# Patient Record
Sex: Male | Born: 1970 | Race: Black or African American | Hispanic: No | Marital: Single | Smoking: Current every day smoker
Health system: Southern US, Community
[De-identification: ages and names within clinical notes are randomized; demographics above are authoritative.]

## PROBLEM LIST (undated history)

## (undated) VITALS — BP 96/66 | HR 86 | Temp 97.4°F | Resp 16 | Ht 73.0 in | Wt 154.0 lb

## (undated) DIAGNOSIS — D571 Sickle-cell disease without crisis: Secondary | ICD-10-CM

## (undated) DIAGNOSIS — F419 Anxiety disorder, unspecified: Secondary | ICD-10-CM

## (undated) DIAGNOSIS — F32A Depression, unspecified: Secondary | ICD-10-CM

## (undated) DIAGNOSIS — F329 Major depressive disorder, single episode, unspecified: Secondary | ICD-10-CM

## (undated) DIAGNOSIS — F1010 Alcohol abuse, uncomplicated: Secondary | ICD-10-CM

## (undated) DIAGNOSIS — I10 Essential (primary) hypertension: Secondary | ICD-10-CM

## (undated) HISTORY — PX: TONSILLECTOMY: SUR1361

---

## 2013-05-18 ENCOUNTER — Encounter (HOSPITAL_COMMUNITY): Payer: Self-pay

## 2013-05-18 ENCOUNTER — Emergency Department (HOSPITAL_COMMUNITY)
Admission: EM | Admit: 2013-05-18 | Discharge: 2013-05-20 | Disposition: A | Discharge status: Home / Self Care | Payer: Self-pay | Attending: Emergency Medicine | Admitting: Emergency Medicine

## 2013-05-18 DIAGNOSIS — F1010 Alcohol abuse, uncomplicated: Secondary | ICD-10-CM | POA: Insufficient documentation

## 2013-05-18 DIAGNOSIS — F10229 Alcohol dependence with intoxication, unspecified: Secondary | ICD-10-CM | POA: Insufficient documentation

## 2013-05-18 DIAGNOSIS — F17200 Nicotine dependence, unspecified, uncomplicated: Secondary | ICD-10-CM | POA: Insufficient documentation

## 2013-05-18 DIAGNOSIS — F3289 Other specified depressive episodes: Secondary | ICD-10-CM | POA: Insufficient documentation

## 2013-05-18 DIAGNOSIS — F10230 Alcohol dependence with withdrawal, uncomplicated: Secondary | ICD-10-CM

## 2013-05-18 DIAGNOSIS — F329 Major depressive disorder, single episode, unspecified: Secondary | ICD-10-CM | POA: Insufficient documentation

## 2013-05-18 DIAGNOSIS — G250 Essential tremor: Secondary | ICD-10-CM | POA: Insufficient documentation

## 2013-05-18 HISTORY — DX: Alcohol abuse, uncomplicated: F10.10

## 2013-05-18 LAB — RAPID URINE DRUG SCREEN, HOSP PERFORMED
Barbiturates: NOT DETECTED
Cocaine: NOT DETECTED
Opiates: NOT DETECTED

## 2013-05-18 LAB — COMPREHENSIVE METABOLIC PANEL
CO2: 25 mEq/L (ref 19–32)
Calcium: 9.5 mg/dL (ref 8.4–10.5)
Creatinine, Ser: 0.91 mg/dL (ref 0.50–1.35)
GFR calc Af Amer: >90 mL/min (ref >90)
GFR calc non Af Amer: >90 mL/min (ref >90)
Glucose, Bld: 78 mg/dL (ref 70–99)
Total Bilirubin: 0.4 mg/dL (ref 0.3–1.2)

## 2013-05-18 LAB — CBC
HCT: 44.6 % (ref 39.0–52.0)
Hemoglobin: 15.2 g/dL (ref 13.0–17.0)
MCH: 26.6 pg (ref 26.0–34.0)
MCV: 78 fL (ref 78.0–100.0)
RBC: 5.72 MIL/uL (ref 4.22–5.81)

## 2013-05-18 MED ORDER — LORAZEPAM 2 MG/ML IJ SOLN: 1.0000 mg | Freq: Four times a day (QID) | INTRAMUSCULAR | Status: DC | PRN

## 2013-05-18 MED ORDER — LORAZEPAM 1 MG PO TABS
0.0000 mg | ORAL_TABLET | Freq: Four times a day (QID) | ORAL | Status: DC
  Administered 2013-05-19: 1 mg via ORAL

## 2013-05-18 MED ORDER — ZOLPIDEM TARTRATE 5 MG PO TABS: 5.0000 mg | ORAL_TABLET | Freq: Every evening | ORAL | Status: DC | PRN

## 2013-05-18 MED ORDER — LORAZEPAM 1 MG PO TABS: 0.0000 mg | ORAL_TABLET | Freq: Two times a day (BID) | ORAL | Status: DC

## 2013-05-18 MED ORDER — THIAMINE HCL 100 MG/ML IJ SOLN: 100.0000 mg | Freq: Every day | INTRAMUSCULAR | Status: DC

## 2013-05-18 MED ORDER — VITAMIN B-1 100 MG PO TABS
100.0000 mg | ORAL_TABLET | Freq: Every day | ORAL | Status: DC
  Administered 2013-05-19 – 2013-05-20 (×2): 100 mg via ORAL
  Filled 2013-05-18: qty 1

## 2013-05-18 MED ORDER — FOLIC ACID 1 MG PO TABS
1.0000 mg | ORAL_TABLET | Freq: Every day | ORAL | Status: DC
  Administered 2013-05-19 – 2013-05-20 (×2): 1 mg via ORAL
  Filled 2013-05-18 (×2): qty 1

## 2013-05-18 MED ORDER — NICOTINE 21 MG/24HR TD PT24
21.0000 mg | MEDICATED_PATCH | Freq: Every day | TRANSDERMAL | Status: DC
  Administered 2013-05-19: 21 mg via TRANSDERMAL
  Filled 2013-05-18 (×2): qty 1

## 2013-05-18 MED ORDER — LORAZEPAM 1 MG PO TABS
1.0000 mg | ORAL_TABLET | Freq: Four times a day (QID) | ORAL | Status: DC | PRN
  Administered 2013-05-19: 1 mg via ORAL
  Filled 2013-05-18: qty 1

## 2013-05-18 MED ORDER — ADULT MULTIVITAMIN W/MINERALS CH
1.0000 | ORAL_TABLET | Freq: Every day | ORAL | Status: DC
  Administered 2013-05-19 – 2013-05-20 (×2): 1 via ORAL
  Filled 2013-05-18 (×2): qty 1

## 2013-05-18 MED ORDER — ONDANSETRON HCL 4 MG PO TABS: 4.0000 mg | ORAL_TABLET | Freq: Three times a day (TID) | ORAL | Status: DC | PRN

## 2013-05-18 NOTE — ED Provider Notes (Signed)
History  This chart was scribed for Jaynie Crumble, PA-C working with Juliet Rude. Rubin Payor, MD by Greggory Stallion, ED scribe. This patient was seen in room WTR4/WLPT4 and the patient's care was started at 6:44 PM.  CSN: [id number] Arrival date & time 05/18/13  1744   Chief Complaint  Patient presents with  . Alcohol Intoxication  . Medical Clearance   The history is provided by the patient. No language interpreter was used.    HPI Comments: Chad Sanchez is a 42 y.o. male who presents to the Emergency Department for alcohol detox. He states his last drink was 1 hour PTA. Pt states he was in a 2 year detox program and got out 9 months ago and started drinking 2 weeks ago. Pt states he felt like everything went wrong so he started drinking again. He states he has had a drinking problem for about 25 years. Pt states he drinks "as much as he can get, all day long". Pt denies SI and HI. Pt denies recreational drug use. Pt states he smokes cigarettes daily.   Past Medical History  Diagnosis Date  . Alcohol abuse    History reviewed. No pertinent past surgical history. No family history on file. History  Substance Use Topics  . Smoking status: Current Every Day Smoker  . Smokeless tobacco: Not on file  . Alcohol Use: Yes     Comment: a case a day,     Review of Systems  Psychiatric/Behavioral: Negative for suicidal ideas.  All other systems reviewed and are negative.    Allergies  Review of patient's allergies indicates no known allergies.  Home Medications  No current outpatient prescriptions on file.  BP 118/77  Pulse 88  Temp(Src) 98.5 F (36.9 C) (Oral)  Resp 16  Wt 160 lb (72.576 kg)  SpO2 98%  Physical Exam  Nursing note and vitals reviewed. Constitutional: He is oriented to person, place, and time. He appears well-developed and well-nourished. No distress.  Pt smells of alcohol.   HENT:  Head: Normocephalic and atraumatic.  Eyes: EOM are normal.  Neck:  Normal range of motion. Neck supple. No tracheal deviation present.  Cardiovascular: Normal rate, regular rhythm and normal heart sounds.   No murmur heard. Pulmonary/Chest: Effort normal and breath sounds normal. No respiratory distress. He has no wheezes. He has no rales.  Musculoskeletal: Normal range of motion.  Neurological: He is alert and oriented to person, place, and time.  No tremor noticed.   Skin: Skin is warm and dry. He is not diaphoretic.  Psychiatric: He has a normal mood and affect. His behavior is normal. Judgment and thought content normal.    ED Course  Procedures (including critical care time)  DIAGNOSTIC STUDIES: Oxygen Saturation is 98% on RA, normal by my interpretation.    COORDINATION OF CARE: 7:00 PM-Discussed treatment plan which includes talking with assessment team with pt at bedside and pt agreed to plan.   Results for orders placed during the hospital encounter of 05/18/13  ACETAMINOPHEN LEVEL      Result Value Range   Acetaminophen (Tylenol), Serum <15.0  10 - 30 ug/mL  CBC      Result Value Range   WBC 4.5  4.0 - 10.5 K/uL   RBC 5.72  4.22 - 5.81 MIL/uL   Hemoglobin 15.2  13.0 - 17.0 g/dL   HCT 40.9  81.1 - 91.4 %   MCV 78.0  78.0 - 100.0 fL   MCH 26.6  26.0 - 34.0  pg   MCHC 34.1  30.0 - 36.0 g/dL   RDW 45.4  09.8 - 11.9 %   Platelets 184  150 - 400 K/uL  COMPREHENSIVE METABOLIC PANEL      Result Value Range   Sodium 140  135 - 145 mEq/L   Potassium 3.8  3.5 - 5.1 mEq/L   Chloride 101  96 - 112 mEq/L   CO2 25  19 - 32 mEq/L   Glucose, Bld 78  70 - 99 mg/dL   BUN 7  6 - 23 mg/dL   Creatinine, Ser 1.47  0.50 - 1.35 mg/dL   Calcium 9.5  8.4 - 82.9 mg/dL   Total Protein 8.7 (*) 6.0 - 8.3 g/dL   Albumin 4.6  3.5 - 5.2 g/dL   AST 31  0 - 37 U/L   ALT 20  0 - 53 U/L   Alkaline Phosphatase 65  39 - 117 U/L   Total Bilirubin 0.4  0.3 - 1.2 mg/dL   GFR calc non Af Amer >90  >90 mL/min   GFR calc Af Amer >90  >90 mL/min  ETHANOL      Result  Value Range   Alcohol, Ethyl (B) 297 (*) 0 - 11 mg/dL  URINE RAPID DRUG SCREEN (HOSP PERFORMED)      Result Value Range   Opiates NONE DETECTED  NONE DETECTED   Cocaine NONE DETECTED  NONE DETECTED   Benzodiazepines NONE DETECTED  NONE DETECTED   Amphetamines NONE DETECTED  NONE DETECTED   Tetrahydrocannabinol NONE DETECTED  NONE DETECTED   Barbiturates NONE DETECTED  NONE DETECTED  SALICYLATE LEVEL      Result Value Range   Salicylate Lvl <2.0 (*) 2.8 - 20.0 mg/dL   No results found.   No results found.  1. Alcohol abuse   2. Alcohol withdrawal, uncomplicated     MDM  PT with alcohol abuse, relapsed after being kicked out of rehab. Pt asking for detox. Will get act to assess.   Filed Vitals:   05/18/13 1825 05/18/13 2130  BP: 118/77 124/75  Pulse: 88 95  Temp: 98.5 F (36.9 C) 97.5 F (36.4 C)  TempSrc: Oral Oral  Resp: 16 16  Weight: 160 lb (72.576 kg)   SpO2: 98% 95%      I personally performed the services described in this documentation, which was scribed in my presence. The recorded information has been reviewed and is accurate.   Lottie Mussel, PA-C 05/21/13 [phone]

## 2013-05-18 NOTE — ED Notes (Signed)
Per EMS, ETOH, states he has been depressed and drinking daily-wants detox and help for depression-was given instant glucose for a CBG of 61-brought up to 79

## 2013-05-18 NOTE — ED Notes (Signed)
Pt states lives [facility]

## 2013-05-18 NOTE — ED Notes (Signed)
Pt states that he wants detox from ETOH.  He states that he drinks one case of beer daily.  Pt was clean for 9 months, while in treatment at [facility] in [place].  However, pt was terminated from the program on July 1 because he brought his girlfriend there for treatment.  Staff was not aware of the relationship between pt and girlfriend, upon admission.  Once the relationship was exposed pt was asked to leave.  Pt states that on July 1, once he returned back to [place], he relapsed.  Pt states that the is currently staying at [facility].

## 2013-05-18 NOTE — ED Notes (Signed)
Pt requesting detox from alcohol, states was in a detox program for 8months, got out 2wks ago and been drinking a case a day. Denies SI/HI

## 2013-05-19 ENCOUNTER — Encounter (HOSPITAL_COMMUNITY): Payer: Self-pay | Admitting: Registered Nurse

## 2013-05-19 DIAGNOSIS — F10239 Alcohol dependence with withdrawal, unspecified: Secondary | ICD-10-CM

## 2013-05-19 DIAGNOSIS — F1010 Alcohol abuse, uncomplicated: Secondary | ICD-10-CM

## 2013-05-19 MED ORDER — CHLORDIAZEPOXIDE HCL 25 MG PO CAPS: 25.0000 mg | ORAL_CAPSULE | Freq: Every day | ORAL | Status: DC

## 2013-05-19 MED ORDER — CHLORDIAZEPOXIDE HCL 25 MG PO CAPS
25.0000 mg | ORAL_CAPSULE | Freq: Once | ORAL | Status: AC
  Administered 2013-05-19: 25 mg via ORAL
  Filled 2013-05-19: qty 1

## 2013-05-19 MED ORDER — CHLORDIAZEPOXIDE HCL 25 MG PO CAPS
25.0000 mg | ORAL_CAPSULE | Freq: Four times a day (QID) | ORAL | Status: AC
  Administered 2013-05-19 – 2013-05-20 (×4): 25 mg via ORAL
  Filled 2013-05-19 (×4): qty 1

## 2013-05-19 MED ORDER — ONDANSETRON 4 MG PO TBDP: 4.0000 mg | ORAL_TABLET | Freq: Four times a day (QID) | ORAL | Status: DC | PRN

## 2013-05-19 MED ORDER — LOPERAMIDE HCL 2 MG PO CAPS: 2.0000 mg | ORAL_CAPSULE | ORAL | Status: DC | PRN

## 2013-05-19 MED ORDER — CHLORDIAZEPOXIDE HCL 25 MG PO CAPS: 25.0000 mg | ORAL_CAPSULE | Freq: Three times a day (TID) | ORAL | Status: DC

## 2013-05-19 MED ORDER — VITAMIN B-1 100 MG PO TABS: 100.0000 mg | ORAL_TABLET | Freq: Every day | ORAL | Status: DC

## 2013-05-19 MED ORDER — THIAMINE HCL 100 MG/ML IJ SOLN: 100.0000 mg | Freq: Once | INTRAMUSCULAR | Status: DC

## 2013-05-19 MED ORDER — HYDROXYZINE HCL 25 MG PO TABS: 25.0000 mg | ORAL_TABLET | Freq: Four times a day (QID) | ORAL | Status: DC | PRN

## 2013-05-19 MED ORDER — CHLORDIAZEPOXIDE HCL 25 MG PO CAPS: 25.0000 mg | ORAL_CAPSULE | Freq: Four times a day (QID) | ORAL | Status: DC | PRN

## 2013-05-19 MED ORDER — ADULT MULTIVITAMIN W/MINERALS CH: 1.0000 | ORAL_TABLET | Freq: Every day | ORAL | Status: DC

## 2013-05-19 MED ORDER — CHLORDIAZEPOXIDE HCL 25 MG PO CAPS: 25.0000 mg | ORAL_CAPSULE | ORAL | Status: DC

## 2013-05-19 NOTE — ED Notes (Signed)
Pt denies SI/HI/AVH.  Pt states, "Everytime I get my life on track, I mess it up.  I don't know what my problem is.  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  I've been spoiled by women in my life always giving me what I want.  Even if I had to manipulate people, I have always been able to get my way.  I feel that it has ruined me.  When I get the things that I think will make me happy, I'm still not content."

## 2013-05-19 NOTE — Progress Notes (Signed)
[facility] has seen patient and provided him with a [facility] Orange Card application.  °

## 2013-05-19 NOTE — Consult Note (Signed)
Reason for Consult: Evaluation for inpatient treatment Referring Physician:  EDP  Chad Sanchez is an 42 y.o. male.  HPI:  Patient presents to [facility] for alcohol detox.  Patient states that he is residing in the [facility] [place]. Patient states that he was recently asked to leave the [facility] program related to having a girlfriend there and both unable to be there at the same time.  Patient states that he has relapsed and has been drinking a 12 pk beer daily.  Patient is complaining or tremors at this time.  Patient states that he only needs to be detox.  "If I can get over these shakes, I'll be all right.  I have some things I need to take care of before I go back in to rehab.  I want to I just got some things to take of first.  If I can detox I wont need to drink to keep the shakes off."  Patient also states that he needs to make sure that he is not going to lose [facility] bed [facility].  CW spoke with [facility] and was informed that they could hold his bed for one more day.     Past Medical History  Diagnosis Date  . Alcohol abuse     History reviewed. No pertinent past surgical history.  No family history on file.  Social History:  reports that he has been smoking.  He does not have any smokeless tobacco history on file. He reports that  drinks alcohol. He reports that he does not use illicit drugs.  Allergies: No Known Allergies  Medications: I have reviewed the patient's current medications.  Results for orders placed during the hospital encounter of 05/18/13 (from the past 48 hour(s))  ACETAMINOPHEN LEVEL     Status: None   Collection Time    05/18/13  7:00 PM      Result Value Range   Acetaminophen (Tylenol), Serum <15.0  10 - 30 ug/mL   Comment:            THERAPEUTIC CONCENTRATIONS VARY     SIGNIFICANTLY. A RANGE OF 10-30     ug/mL MAY BE AN EFFECTIVE     CONCENTRATION FOR MANY PATIENTS.     HOWEVER, SOME ARE BEST TREATED     AT CONCENTRATIONS OUTSIDE THIS     RANGE.      ACETAMINOPHEN CONCENTRATIONS     >150 ug/mL AT 4 HOURS AFTER     INGESTION AND >50 ug/mL AT 12     HOURS AFTER INGESTION ARE     OFTEN ASSOCIATED WITH TOXIC     REACTIONS.  CBC     Status: None   Collection Time    05/18/13  7:00 PM      Result Value Range   WBC 4.5  4.0 - 10.5 K/uL   RBC 5.72  4.22 - 5.81 MIL/uL   Hemoglobin 15.2  13.0 - 17.0 g/dL   HCT 16.1  09.6 - 04.5 %   MCV 78.0  78.0 - 100.0 fL   MCH 26.6  26.0 - 34.0 pg   MCHC 34.1  30.0 - 36.0 g/dL   RDW 40.9  81.1 - 91.4 %   Platelets 184  150 - 400 K/uL  COMPREHENSIVE METABOLIC PANEL     Status: Abnormal   Collection Time    05/18/13  7:00 PM      Result Value Range   Sodium 140  135 - 145 mEq/L  Potassium 3.8  3.5 - 5.1 mEq/L   Chloride 101  96 - 112 mEq/L   CO2 25  19 - 32 mEq/L   Glucose, Bld 78  70 - 99 mg/dL   BUN 7  6 - 23 mg/dL   Creatinine, Ser 4.54  0.50 - 1.35 mg/dL   Calcium 9.5  8.4 - 09.8 mg/dL   Total Protein 8.7 (*) 6.0 - 8.3 g/dL   Albumin 4.6  3.5 - 5.2 g/dL   AST 31  0 - 37 U/L   ALT 20  0 - 53 U/L   Alkaline Phosphatase 65  39 - 117 U/L   Total Bilirubin 0.4  0.3 - 1.2 mg/dL   GFR calc non Af Amer >90  >90 mL/min   GFR calc Af Amer >90  >90 mL/min   Comment:            The eGFR has been calculated     using the CKD EPI equation.     This calculation has not been     validated in all clinical     situations.     eGFR's persistently     <90 mL/min signify     possible Chronic Kidney Disease.  ETHANOL     Status: Abnormal   Collection Time    05/18/13  7:00 PM      Result Value Range   Alcohol, Ethyl (B) 297 (*) 0 - 11 mg/dL   Comment:            LOWEST DETECTABLE LIMIT FOR     SERUM ALCOHOL IS 11 mg/dL     FOR MEDICAL PURPOSES ONLY  SALICYLATE LEVEL     Status: Abnormal   Collection Time    05/18/13  7:00 PM      Result Value Range   Salicylate Lvl <2.0 (*) 2.8 - 20.0 mg/dL  URINE RAPID DRUG SCREEN (HOSP PERFORMED)     Status: None   Collection Time    05/18/13  8:37 PM       Result Value Range   Opiates NONE DETECTED  NONE DETECTED   Cocaine NONE DETECTED  NONE DETECTED   Benzodiazepines NONE DETECTED  NONE DETECTED   Amphetamines NONE DETECTED  NONE DETECTED   Tetrahydrocannabinol NONE DETECTED  NONE DETECTED   Barbiturates NONE DETECTED  NONE DETECTED   Comment:            DRUG SCREEN FOR MEDICAL PURPOSES     ONLY.  IF CONFIRMATION IS NEEDED     FOR ANY PURPOSE, NOTIFY LAB     WITHIN 5 DAYS.                LOWEST DETECTABLE LIMITS     FOR URINE DRUG SCREEN     Drug Class       Cutoff (ng/mL)     Amphetamine      1000     Barbiturate      200     Benzodiazepine   200     Tricyclics       300     Opiates          300     Cocaine          300     THC              50    No results found.  Review of Systems  Neurological: Positive for tremors.  Psychiatric/Behavioral: Positive for depression  and substance abuse (ETOH). Negative for suicidal ideas, hallucinations and memory loss. The patient is not nervous/anxious and does not have insomnia.    Blood pressure 116/79, pulse 72, temperature 98.5 F (36.9 C), temperature source Oral, resp. rate 18, weight 72.576 kg (160 lb), SpO2 100.00%. Physical Exam  Constitutional: He is oriented to person, place, and time. He appears well-developed and well-nourished.  HENT:  Head: Normocephalic and atraumatic.  Neck: Normal range of motion.  Respiratory: Effort normal.  Neurological: He is alert and oriented to person, place, and time.  Skin: Skin is warm and dry.  Psychiatric: He is not actively hallucinating. Thought content is not paranoid and not delusional. Cognition and memory are normal. He exhibits a depressed mood. He expresses no homicidal and no suicidal ideation.   Face to face interview and consult with Dr. Lolly Mustache Assessment/Plan: Axis I: Alcohol Abuse and Alcohol jWithdrawal Axis II: Deferred Axis III:  Past Medical History  Diagnosis Date  . Alcohol abuse    Axis IV: economic  problems, housing problems, occupational problems and other psychosocial or environmental problems Axis V: 61-70 mild symptoms  Recommendation:  Start Librium protocol for alcohol detox.  Monitor patient over night.  Discharge home 05/20/2013 with resources and outpatient services for rehab and support.  Rankin, Shuvon , FNP-BC  05/19/2013, 1:54 PM    I have personally seen the patient and agreed with the findings and involved in the treatment plan. Kathryne Sharper, MD

## 2013-05-19 NOTE — Progress Notes (Signed)
   CARE MANAGEMENT ED NOTE 05/19/2013  Patient:  SUNG, Chad Sanchez   Account Number:  [id number]  Date Initiated:  05/19/2013  Documentation initiated by:  Radford Pax  Subjective/Objective Assessment:     Subjective/Objective Assessment Detail:     Action/Plan:   Action/Plan Detail:   Anticipated DC Date:       Status Recommendation to Physician:   Result of Recommendation:    Other ED Services  Consult Working Plan    DC Planning Services  Other  PCP issues    Choice offered to / List presented to:            Status of service:  Completed, signed off  ED Comments:   ED Comments Detail:  EDCM and [facility] rep spke to this patient at bedside.  Patient confirms that he does not have a pcp or insurance.  [facility] rep provided patient with orange card application.  [facility] provided patient with list of pcp's who accept self pay patients,list of community resorces for financial assistance and medication assistance and information on how to apply for the Adult Care Act and Medicaid for insurance. Stressed importance on finding a pcp with patient.  Patient thankful for resources.

## 2013-05-19 NOTE — BH Assessment (Signed)
Assessment Note   Chad Sanchez is an 42 y.o. male, single, African-American who presents to [facility] requesting alcohol detox. Pt states he has a long history of alcohol dependence and 2 weeks ago was "kicked out" of [facility] program in [place] after 9 months due to having a relationship with a male resident, which is against the rules. Pt states that he has been drinking daily for the past 2 weeks. He states he has been drinking at least 12 beers daily and last drink was 05/18/13. He presented to [facility] with a blood alcohol level of 297 at 1900 on 05/18/13. Pt denies any other substance use and UDS was negative. He reports he has a history of withdrawal symptoms including nausea, tremors and poor appetite. He reports a history of blackouts but denies history of seizures. He reports he was adopted at 5 weeks old and that his adoptive mother was an alcoholic and died 5 years ago of alcohol related problems.  Pt reports depressive symptoms related to relapse and poor support. He reports occasional crying spells, social withdrawal, poor appetite and feelings of guilt and hopelessness. He denies any past or current suicidal ideation. He denies any history of suicide attempts. He denies any history of intentional self-harm behavior. He denies any homicidal ideation or history of violence. He denies any psychotic symptoms.  Pt reports primary stressors are related to relapse and poor support. He is currently homeless and living in a shelter, [facility]. He is unemployed. He has no family support or friends who are supportive.   Pt is dressed in a hospital gown, hygiene intact, alert, oriented x4 with normal speech and normal motor behavior. His thought process is coherent and goal directed. Mood is guilty and affect is congruent with mood. Pt is cooperative.  Axis I: 303.90 Alcohol Dependence Axis II: Deferred Axis III:  Past Medical History  Diagnosis Date  . Alcohol abuse    Axis IV: economic problems,  housing problems, occupational problems and problems with primary support group Axis V: GAF=35  Past Medical History:  Past Medical History  Diagnosis Date  . Alcohol abuse     History reviewed. No pertinent past surgical history.  Family History: No family history on file.  Social History:  reports that he has been smoking.  He does not have any smokeless tobacco history on file. He reports that  drinks alcohol. He reports that he does not use illicit drugs.  Additional Social History:  Alcohol / Drug Use Pain Medications: Denies Prescriptions: Denies Over the Counter: Denies History of alcohol / drug use?: Yes Longest period of sobriety (when/how long): 3 years Negative Consequences of Use: Financial;Personal relationships;Work / Programmer, multimedia Withdrawal Symptoms: Nausea / Vomiting;Tremors Substance #1 Name of Substance 1: Alcohol 1 - Age of First Use: 13 1 - Amount (size/oz): 12 beers 1 - Frequency: daily 1 - Duration: 2 weeks 1 - Last Use / Amount: 05/18/13, five 40-oz beers  CIWA: CIWA-Ar BP: 106/64 mmHg Pulse Rate: 76 Nausea and Vomiting: mild nausea with no vomiting Tactile Disturbances: none Tremor: no tremor Auditory Disturbances: not present Paroxysmal Sweats: no sweat visible Visual Disturbances: not present Anxiety: no anxiety, at ease Headache, Fullness in Head: none present Agitation: normal activity Orientation and Clouding of Sensorium: oriented and can do serial additions CIWA-Ar Total: 1 COWS:    Allergies: No Known Allergies  Home Medications:  (Not in a hospital admission)  OB/GYN Status:  No LMP for male patient.  General Assessment Data Location of Assessment:  [facility] Assessment Services Living Arrangements: Other (Comment) (Homeless shelter) Can pt return to current living arrangement?: Yes Admission Status: Voluntary Is patient capable of signing voluntary admission?: Yes Transfer from: Other (Comment) Youth worker) Referral Source:  Self/Family/Friend  Education Status Is patient currently in school?: No Current Grade: NA Highest grade of school patient has completed: NA Name of school: NA Contact person: NA  Risk to self Suicidal Ideation: No Suicidal Intent: No Is patient at risk for suicide?: No Suicidal Plan?: No Access to Means: No What has been your use of drugs/alcohol within the last 12 months?: Pt has been using alcohol daily for 2 weeks Previous Attempts/Gestures: No How many times?: 0 Other Self Harm Risks: None Triggers for Past Attempts: None known Intentional Self Injurious Behavior: None Family Suicide History: No Recent stressful life event(s): Financial Problems;Other (Comment) (Kicked out of [place] program) Persecutory voices/beliefs?: No Depression: Yes Depression Symptoms: Despondent;Tearfulness;Isolating;Fatigue;Guilt;Feeling worthless/self pity Substance abuse history and/or treatment for substance abuse?: Yes Suicide prevention information given to non-admitted patients: Not applicable  Risk to Others Homicidal Ideation: No Thoughts of Harm to Others: No Current Homicidal Intent: No Current Homicidal Plan: No Access to Homicidal Means: No Identified Victim: None History of harm to others?: No Assessment of Violence: None Noted Violent Behavior Description: None Does patient have access to weapons?: No Criminal Charges Pending?: No Does patient have a court date: No  Psychosis Hallucinations: None noted Delusions: None noted  Mental Status Report Appear/Hygiene: Other (Comment) (Casually dressed, hygiene intact) Eye Contact: Good Motor Activity: Unremarkable Speech: Logical/coherent Level of Consciousness: Alert Mood: Guilty Affect: Appropriate to circumstance Anxiety Level: None Thought Processes: Coherent;Relevant Judgement: Unimpaired Orientation: Person;Place;Time;Situation Obsessive Compulsive Thoughts/Behaviors: None  Cognitive Functioning Concentration:  Normal Memory: Recent Intact;Remote Intact IQ: Average Insight: Good Impulse Control: Good Appetite: Poor Weight Loss: 5 Weight Gain: 0 Sleep: No Change Total Hours of Sleep: 8 Vegetative Symptoms: None  ADLScreening [facility] Assessment Services) Patient's cognitive ability adequate to safely complete daily activities?: Yes Patient able to express need for assistance with ADLs?: Yes Independently performs ADLs?: Yes (appropriate for developmental age)  Abuse/Neglect [facility]) Physical Abuse: Denies Verbal Abuse: Denies Sexual Abuse: Denies  Prior Inpatient Therapy Prior Inpatient Therapy: Yes Prior Therapy Dates: 2014 and other dates Prior Therapy Facilty/Provider(s): [facility] and other detox centers Reason for Treatment: Alcohol dependence  Prior Outpatient Therapy Prior Outpatient Therapy: Yes Prior Therapy Dates: current Prior Therapy Facilty/Provider(s): [facility] Reason for Treatment: Alcohol dependence  ADL Screening (condition at time of admission) Patient's cognitive ability adequate to safely complete daily activities?: Yes Is the patient deaf or have difficulty hearing?: No Does the patient have difficulty seeing, even when wearing glasses/contacts?: No Does the patient have difficulty concentrating, remembering, or making decisions?: No Patient able to express need for assistance with ADLs?: Yes Does the patient have difficulty dressing or bathing?: No Independently performs ADLs?: Yes (appropriate for developmental age) Does the patient have difficulty walking or climbing stairs?: No Weakness of Legs: None Weakness of Arms/Hands: None  Home Assistive Devices/Equipment Home Assistive Devices/Equipment: None    Abuse/Neglect Assessment (Assessment to be complete while patient is alone) Physical Abuse: Denies Verbal Abuse: Denies Sexual Abuse: Denies Exploitation of patient/patient's resources: Denies Self-Neglect: Denies Values / Beliefs Cultural  Requests During Hospitalization: None Spiritual Requests During Hospitalization: None   Advance Directives (For Healthcare) Advance Directive: Patient does not have advance directive;Patient would not like information Pre-existing out of facility DNR order (yellow form or pink MOST form): No Nutrition Screen- [facility] Adult/WL/AP  Patient's home diet: Regular  Additional Information 1:1 In Past 12 Months?: No CIRT Risk: No Elopement Risk: No Does patient have medical clearance?: Yes     Disposition:  Disposition Initial Assessment Completed for this Encounter: Yes Disposition of Patient: Inpatient treatment program Type of inpatient treatment program: Adult  On Site Evaluation by:   Reviewed with Physician: Kathryne Sharper, MD  Pt does not want to be referred to a detox center because he is afraid of losing his bed at [facility]. Dr. Lolly Mustache consulted with Pt and agrees that Pt can be medicated, observed in the psych ED and discharged tomorrow back to [facility]. With Pt's permission, this LPC contacted Arlyss Repress at [facility] and asked if Pt's bed can be held until Pt is discharged on 05/20/13. Mr Amada Jupiter assured me Pt's be would be held. No clinical information was given to Mr Amada Jupiter.  Harlin Rain Patsy [place], [facility], [facility] Assessment Counselor    Pamalee Leyden 05/19/2013 9:16 AM

## 2013-05-20 NOTE — BHH Counselor (Signed)
Patient evaluated by Dr. Lolly Mustache 05/19/2013 and his recommendations state, "Start Librium protocol for alcohol detox. Monitor patient over night. Discharge home 05/20/2013 with resources and outpatient services for rehab and support.". Writer followed up with patient and today he denies W/D symptoms. Writer provided patient information to the [facility] as they are holding his bed today. Writer also referred referred to substance abuse facilities in the community including ([facility], [facility], [facility]).

## 2013-06-02 NOTE — ED Provider Notes (Signed)
Medical screening examination/treatment/procedure(s) were performed by non-physician practitioner and as supervising physician I was immediately available for consultation/collaboration.  Amena Dockham R. Yael Coppess, MD 06/02/13 2043 

## 2013-06-03 ENCOUNTER — Encounter (HOSPITAL_COMMUNITY): Payer: Self-pay | Admitting: Emergency Medicine

## 2013-06-03 ENCOUNTER — Emergency Department (HOSPITAL_COMMUNITY)
Admission: EM | Admit: 2013-06-03 | Discharge: 2013-06-04 | Disposition: A | Discharge status: Other Acute Inpatient Hospital | Payer: Self-pay | Attending: Emergency Medicine | Admitting: Emergency Medicine

## 2013-06-03 DIAGNOSIS — F329 Major depressive disorder, single episode, unspecified: Secondary | ICD-10-CM | POA: Insufficient documentation

## 2013-06-03 DIAGNOSIS — F17200 Nicotine dependence, unspecified, uncomplicated: Secondary | ICD-10-CM | POA: Insufficient documentation

## 2013-06-03 DIAGNOSIS — R45851 Suicidal ideations: Secondary | ICD-10-CM | POA: Insufficient documentation

## 2013-06-03 DIAGNOSIS — F32A Depression, unspecified: Secondary | ICD-10-CM

## 2013-06-03 DIAGNOSIS — F3289 Other specified depressive episodes: Secondary | ICD-10-CM | POA: Insufficient documentation

## 2013-06-03 DIAGNOSIS — F1010 Alcohol abuse, uncomplicated: Secondary | ICD-10-CM

## 2013-06-03 HISTORY — DX: Depression, unspecified: F32.A

## 2013-06-03 HISTORY — DX: Major depressive disorder, single episode, unspecified: F32.9

## 2013-06-03 LAB — CBC WITH DIFFERENTIAL/PLATELET
Basophils Absolute: 0 10*3/uL (ref 0.0–0.1)
Basophils Relative: 1 % (ref 0–1)
Eosinophils Absolute: 0.1 10*3/uL (ref 0.0–0.7)
Hemoglobin: 15.4 g/dL (ref 13.0–17.0)
MCH: 28.3 pg (ref 26.0–34.0)
MCHC: 35.3 g/dL (ref 30.0–36.0)
Monocytes Absolute: 0.2 10*3/uL (ref 0.1–1.0)
Monocytes Relative: 5 % (ref 3–12)
Neutro Abs: 2.1 10*3/uL (ref 1.7–7.7)
Neutrophils Relative %: 47 % (ref 43–77)
RDW: 14.9 % (ref 11.5–15.5)

## 2013-06-03 LAB — RAPID URINE DRUG SCREEN, HOSP PERFORMED
Amphetamines: NOT DETECTED
Barbiturates: NOT DETECTED
Benzodiazepines: NOT DETECTED
Cocaine: POSITIVE — AB
Opiates: NOT DETECTED
Tetrahydrocannabinol: NOT DETECTED

## 2013-06-03 LAB — ETHANOL: Alcohol, Ethyl (B): 201 mg/dL — ABNORMAL HIGH (ref 0–11)

## 2013-06-03 LAB — COMPREHENSIVE METABOLIC PANEL
AST: 27 U/L (ref 0–37)
Albumin: 4.2 g/dL (ref 3.5–5.2)
BUN: 6 mg/dL (ref 6–23)
Chloride: 101 mEq/L (ref 96–112)
Creatinine, Ser: 0.98 mg/dL (ref 0.50–1.35)
Total Protein: 8.1 g/dL (ref 6.0–8.3)

## 2013-06-03 MED ORDER — LORAZEPAM 1 MG PO TABS
0.0000 mg | ORAL_TABLET | Freq: Four times a day (QID) | ORAL | Status: DC
  Administered 2013-06-04: 1 mg via ORAL
  Filled 2013-06-03 (×2): qty 1

## 2013-06-03 MED ORDER — VITAMIN B-1 100 MG PO TABS
100.0000 mg | ORAL_TABLET | Freq: Every day | ORAL | Status: DC
  Administered 2013-06-03: 100 mg via ORAL
  Filled 2013-06-03: qty 1

## 2013-06-03 MED ORDER — ADULT MULTIVITAMIN W/MINERALS CH
1.0000 | ORAL_TABLET | Freq: Every day | ORAL | Status: DC
  Administered 2013-06-03: 1 via ORAL
  Filled 2013-06-03: qty 1

## 2013-06-03 MED ORDER — LORAZEPAM 1 MG PO TABS
1.0000 mg | ORAL_TABLET | Freq: Four times a day (QID) | ORAL | Status: DC | PRN
  Administered 2013-06-03: 1 mg via ORAL

## 2013-06-03 MED ORDER — THIAMINE HCL 100 MG/ML IJ SOLN: 100.0000 mg | Freq: Every day | INTRAMUSCULAR | Status: DC

## 2013-06-03 MED ORDER — LORAZEPAM 2 MG/ML IJ SOLN: 1.0000 mg | Freq: Four times a day (QID) | INTRAMUSCULAR | Status: DC | PRN

## 2013-06-03 MED ORDER — FOLIC ACID 1 MG PO TABS
1.0000 mg | ORAL_TABLET | Freq: Every day | ORAL | Status: DC
  Administered 2013-06-03: 1 mg via ORAL
  Filled 2013-06-03: qty 1

## 2013-06-03 MED ORDER — LORAZEPAM 1 MG PO TABS: 0.0000 mg | ORAL_TABLET | Freq: Two times a day (BID) | ORAL | Status: DC

## 2013-06-03 NOTE — ED Provider Notes (Signed)
CSN: [id number]     Arrival date & time 06/03/13  1607 History  This chart was scribed for Magnus Sinning, PA-C working with Loren Racer, MD by Greggory Stallion, ED scribe. This patient was seen in room [facility]2/[facility] and the patient's care was started at 4:47 PM.   Chief Complaint  Patient presents with  . Medical Clearance   The history is provided by the patient. No language interpreter was used.    HPI Comments: Chad Sanchez is a 42 y.o. male who presents to the Emergency Department complaining of increased feelings of depression that started a few years ago with associated SI that started today. He states his plan is to walk in front of a car. Pt denies HI. Pt states he was on psychiatric medication years ago but is not on any currently. He states he does not have a psychiatrist currently. Pt states he was recently in a two year behavioral modification program in [place]. Pt states he is mainly here for alcohol detox. He states he drinks as much alcohol as he can everyday. He states it's normally about a 12 pack of beer a day. Pt states his last drink was right before he came in today. He denies recreational drug use. Pt denies abdominal pain or any other associated symptoms.   Past Medical History  Diagnosis Date  . Alcohol abuse    History reviewed. No pertinent past surgical history. History reviewed. No pertinent family history. History  Substance Use Topics  . Smoking status: Current Every Day Smoker  . Smokeless tobacco: Not on file  . Alcohol Use: Yes     Comment: a case a day,     Review of Systems  Gastrointestinal: Negative for abdominal pain.  Psychiatric/Behavioral: Positive for suicidal ideas.  All other systems reviewed and are negative.    Allergies  Review of patient's allergies indicates no known allergies.  Home Medications  No current outpatient prescriptions on file.  BP 112/76  Pulse 83  Temp(Src) 98.8 F (37.1 C) (Oral)  Ht 6\' 2"  (1.88 m)   Wt 160 lb (72.576 kg)  BMI 20.53 kg/m2  SpO2 97%  Physical Exam  Nursing note and vitals reviewed. Constitutional: He is oriented to person, place, and time. He appears well-developed and well-nourished. No distress.  HENT:  Head: Normocephalic and atraumatic.  Eyes: EOM are normal.  Neck: Normal range of motion. Neck supple. No tracheal deviation present.  Cardiovascular: Normal rate and regular rhythm.   Pulmonary/Chest: Effort normal. No respiratory distress.  Abdominal: Soft. There is no tenderness.  Musculoskeletal: Normal range of motion.  Neurological: He is alert and oriented to person, place, and time.  Skin: Skin is warm and dry.  Psychiatric: He has a normal mood and affect. His behavior is normal.    ED Course   Procedures (including critical care time)  DIAGNOSTIC STUDIES: Oxygen Saturation is 97% on RA, normal by my interpretation.    COORDINATION OF CARE: 5:07 PM-Discussed treatment plan which includes being moved to the back of the ED with pt at bedside and pt agreed to plan.   Labs Reviewed  URINE RAPID DRUG SCREEN (HOSP PERFORMED) - Abnormal; Notable for the following:    Cocaine POSITIVE (*)    All other components within normal limits  ETHANOL - Abnormal; Notable for the following:    Alcohol, Ethyl (B) 348 (*)    All other components within normal limits  CBC WITH DIFFERENTIAL  COMPREHENSIVE METABOLIC PANEL   No results  found. No diagnosis found.  MDM  Patient presents requesting alcohol detox.  Patient also states that he is feeling suicidal with plan to walk in front of a car.  Labs unremarkable.  Patient discussed with ACT team.  Patient moved back to Psych ED.  Psych holding orders and CIWA orders placed.  I personally performed the services described in this documentation, which was scribed in my presence. The recorded information has been reviewed and is accurate.   Pascal Lux [place], PA-C 06/03/13 [phone]

## 2013-06-03 NOTE — ED Notes (Addendum)
Pt arrived ambulatory via EMS c/o increasing feeling of depression.  Was on medication "years ago" but none now.  Tearful, sad affect. Alert andf oriented. C/o SI prior to EMS arrival.  Denies plan.

## 2013-06-03 NOTE — ED Provider Notes (Signed)
Medical screening examination/treatment/procedure(s) were performed by non-physician practitioner and as supervising physician I was immediately available for consultation/collaboration.   Loren Racer, MD 06/03/13 2116

## 2013-06-03 NOTE — BH Assessment (Signed)
Assessment Note   Chad Sanchez is a 42 y.o. male who presents to [facility] voluntarily, requesting detox from alcohol.  Pt denies SI/HI/Psych and able to contract for safety.  Pt currently residing at the [facility] and has been living there since 05/04/13.  Pt reports feeling depressed "forever" and "it's exhausting doing the same things and expecting different results".  Pt has legal issues--stolen property charge(stole a cell phone) an court is 06/10/13.  Pt has no current w/d sxs and has been provided an ativan protocol to help with sxs.  Pt consumes 12 pk of beer daily, last drink was today, pt had 1-12oz beer.  Pt has previous alcohol programs with [facility] and other rehab centers but was released from program, did not disclose reason.      Axis I: Depressive D/O; Alcohol Dependence  Axis II: Deferred Axis III:  Past Medical History  Diagnosis Date  . Alcohol abuse   . Depression    Axis IV: economic problems, other psychosocial or environmental problems, problems related to legal system/crime, problems related to social environment and problems with primary support group Axis V: 51-60 moderate symptoms  Past Medical History:  Past Medical History  Diagnosis Date  . Alcohol abuse   . Depression     History reviewed. No pertinent past surgical history.  Family History: History reviewed. No pertinent family history.  Social History:  reports that he has been smoking.  He does not have any smokeless tobacco history on file. He reports that  drinks alcohol. He reports that he does not use illicit drugs.  Additional Social History:  Alcohol / Drug Use Pain Medications: None  Prescriptions: None  Over the Counter: None  Longest period of sobriety (when/how long): None  Negative Consequences of Use: Work / School;Personal relationships;Financial;Legal Withdrawal Symptoms: Other (Comment) (No current w/d sxs ) Substance #1 Name of Substance 1: Alcohol  1 - Age of First Use: 14 YOM  1  - Amount (size/oz): 12 pk 1 - Frequency: Daily  1 - Duration: On-going  1 - Last Use / Amount: 06/03/13  CIWA: CIWA-Ar BP: 128/83 mmHg Pulse Rate: 88 Nausea and Vomiting: no nausea and no vomiting Tactile Disturbances: none Tremor: no tremor Auditory Disturbances: not present Paroxysmal Sweats: no sweat visible Visual Disturbances: not present Anxiety: no anxiety, at ease Headache, Fullness in Head: none present Agitation: normal activity Orientation and Clouding of Sensorium: oriented and can do serial additions CIWA-Ar Total: 0 COWS:    Allergies: No Known Allergies  Home Medications:  (Not in a hospital admission)  OB/GYN Status:  No LMP for male patient.  General Assessment Data Location of Assessment: [facility] Living Arrangements: Other (Comment) (Currently lives at [facility] ) Can pt return to current living arrangement?: Yes Admission Status: Voluntary Is patient capable of signing voluntary admission?: Yes Transfer from: Acute Hospital Referral Source: MD  Education Status Is patient currently in school?: No Current Grade: None  Highest grade of school patient has completed: None  Name of school: None  Contact person: None   Risk to self Suicidal Ideation: No Suicidal Intent: No Is patient at risk for suicide?: No Suicidal Plan?: No Access to Means: No What has been your use of drugs/alcohol within the last 12 months?: Abusing: alcohol  Previous Attempts/Gestures: No How many times?: 0 Other Self Harm Risks: None  Triggers for Past Attempts: None known Intentional Self Injurious Behavior: None Family Suicide History: No Recent stressful life event(s): Financial Problems (Alcohol Abuse; Kicked  out of [facility] program ) Persecutory voices/beliefs?: No Depression: Yes Depression Symptoms: Loss of interest in usual pleasures;Tearfulness Substance abuse history and/or treatment for substance abuse?: Yes Suicide prevention information given to non-admitted  patients: Not applicable  Risk to Others Homicidal Ideation: No Thoughts of Harm to Others: No Current Homicidal Intent: No Current Homicidal Plan: No Access to Homicidal Means: No Identified Victim: None  History of harm to others?: No Assessment of Violence: None Noted Violent Behavior Description: None  Does patient have access to weapons?: No Criminal Charges Pending?: Yes Describe Pending Criminal Charges: Stolen property--stole cell phone Does patient have a court date: Yes Court Date: 06/10/13  Psychosis Hallucinations: None noted Delusions: None noted  Mental Status Report Appear/Hygiene: Other (Comment) (Appropriate ) Eye Contact: Fair Motor Activity: Unremarkable Speech: Soft;Logical/coherent Level of Consciousness: Drowsy Mood: Sad Affect: Sad;Appropriate to circumstance Anxiety Level: None Thought Processes: Coherent;Relevant Judgement: Unimpaired Orientation: Person;Place;Time;Situation Obsessive Compulsive Thoughts/Behaviors: None  Cognitive Functioning Concentration: Normal Memory: Recent Intact;Remote Intact IQ: Average Insight: Fair Impulse Control: Fair Appetite: Good Weight Loss: 0 Weight Gain: 0 Sleep: No Change Total Hours of Sleep: 8 Vegetative Symptoms: None  ADLScreening [facility] Assessment Services) Patient's cognitive ability adequate to safely complete daily activities?: Yes Patient able to express need for assistance with ADLs?: Yes Independently performs ADLs?: Yes (appropriate for developmental age)  Abuse/Neglect [facility]) Physical Abuse: Denies Verbal Abuse: Denies Sexual Abuse: Denies  Prior Inpatient Therapy Prior Inpatient Therapy: Yes Prior Therapy Dates: 2014 and other dates Prior Therapy Facilty/Provider(s): [facility] and other detox centers Reason for Treatment: Alcohol dependence  Prior Outpatient Therapy Prior Outpatient Therapy: Yes Prior Therapy Dates: current Prior Therapy Facilty/Provider(s): [facility] Reason for Treatment: Alcohol dependence  ADL Screening (condition at time of admission) Patient's cognitive ability adequate to safely complete daily activities?: Yes Is the patient deaf or have difficulty hearing?: No Does the patient have difficulty seeing, even when wearing glasses/contacts?: No Does the patient have difficulty concentrating, remembering, or making decisions?: No Patient able to express need for assistance with ADLs?: Yes Does the patient have difficulty dressing or bathing?: No Independently performs ADLs?: Yes (appropriate for developmental age) Does the patient have difficulty walking or climbing stairs?: No Weakness of Legs: None Weakness of Arms/Hands: None  Home Assistive Devices/Equipment Home Assistive Devices/Equipment: None  Therapy Consults (therapy consults require a physician order) PT Evaluation Needed: No OT Evalulation Needed: No SLP Evaluation Needed: No Abuse/Neglect Assessment (Assessment to be complete while patient is alone) Physical Abuse: Denies Verbal Abuse: Denies Sexual Abuse: Denies Exploitation of patient/patient's resources: Denies Self-Neglect: Denies Values / Beliefs Cultural Requests During Hospitalization: None Spiritual Requests During Hospitalization: None Consults Spiritual Care Consult Needed: No Social Work Consult Needed: No Merchant navy officer (For Healthcare) Advance Directive: Patient does not have advance directive;Patient would not like information Pre-existing out of facility DNR order (yellow form or pink MOST form): No Nutrition Screen- [facility] Patient's home diet: Regular  Additional Information 1:1 In Past 12 Months?: No CIRT Risk: No Elopement Risk: No Does patient have medical clearance?: Yes     Disposition:  Disposition Initial Assessment Completed for this Encounter: Yes Disposition of Patient: Inpatient treatment program;Referred to ([facility]) Type of inpatient treatment  program: Adult Patient referred to: [facility]  On Site Evaluation by:   Reviewed with Physician:     Murrell Redden 06/03/2013 8:40 PM

## 2013-06-03 NOTE — ED Notes (Signed)
Patient has one bag of belongings in locker 26. 

## 2013-06-03 NOTE — ED Notes (Signed)
Pt is awake and alert, pt c/o depression for the past month and getting worse. Pt is medication complaint. Pt reports thoughts of harming self with no plan. Pt reports depression from a relationship beak-up about 1 month ago, and not able to control everybody else feeling or thoughts.  Last CIWA is 6. denies HI,AH or VH. Interacts well with staff and treatment team. Support and encouragement offered. Will continue to monitor for safety.

## 2013-06-04 ENCOUNTER — Encounter (HOSPITAL_COMMUNITY): Payer: Self-pay

## 2013-06-04 ENCOUNTER — Inpatient Hospital Stay (HOSPITAL_COMMUNITY)
Admission: EM | Admit: 2013-06-04 | Discharge: 2013-06-07 | DRG: 897 | Disposition: A | Discharge status: Home / Self Care | Payer: No Typology Code available for payment source | Source: Intra-hospital | Attending: Psychiatry | Admitting: Psychiatry

## 2013-06-04 DIAGNOSIS — F1994 Other psychoactive substance use, unspecified with psychoactive substance-induced mood disorder: Secondary | ICD-10-CM | POA: Diagnosis present

## 2013-06-04 DIAGNOSIS — F1020 Alcohol dependence, uncomplicated: Principal | ICD-10-CM | POA: Diagnosis present

## 2013-06-04 DIAGNOSIS — F3289 Other specified depressive episodes: Secondary | ICD-10-CM | POA: Diagnosis present

## 2013-06-04 DIAGNOSIS — F329 Major depressive disorder, single episode, unspecified: Secondary | ICD-10-CM | POA: Diagnosis present

## 2013-06-04 MED ORDER — CHLORDIAZEPOXIDE HCL 25 MG PO CAPS
25.0000 mg | ORAL_CAPSULE | Freq: Four times a day (QID) | ORAL | Status: AC
  Administered 2013-06-04 (×3): 25 mg via ORAL
  Filled 2013-06-04 (×3): qty 1

## 2013-06-04 MED ORDER — ADULT MULTIVITAMIN W/MINERALS CH
1.0000 | ORAL_TABLET | Freq: Every day | ORAL | Status: DC
  Administered 2013-06-04 – 2013-06-05 (×2): 1 via ORAL
  Filled 2013-06-04 (×6): qty 1

## 2013-06-04 MED ORDER — CHLORDIAZEPOXIDE HCL 25 MG PO CAPS: 25.0000 mg | ORAL_CAPSULE | Freq: Once | ORAL | Status: DC

## 2013-06-04 MED ORDER — ONDANSETRON 4 MG PO TBDP: 4.0000 mg | ORAL_TABLET | Freq: Four times a day (QID) | ORAL | Status: AC | PRN

## 2013-06-04 MED ORDER — THIAMINE HCL 100 MG/ML IJ SOLN
100.0000 mg | Freq: Once | INTRAMUSCULAR | Status: AC
  Administered 2013-06-04: 100 mg via INTRAMUSCULAR

## 2013-06-04 MED ORDER — VITAMIN B-1 100 MG PO TABS
100.0000 mg | ORAL_TABLET | Freq: Every day | ORAL | Status: DC
  Administered 2013-06-05: 100 mg via ORAL
  Filled 2013-06-04 (×5): qty 1

## 2013-06-04 MED ORDER — HYDROXYZINE HCL 25 MG PO TABS
25.0000 mg | ORAL_TABLET | Freq: Four times a day (QID) | ORAL | Status: AC | PRN
  Administered 2013-06-05: 25 mg via ORAL

## 2013-06-04 MED ORDER — CHLORDIAZEPOXIDE HCL 25 MG PO CAPS
25.0000 mg | ORAL_CAPSULE | Freq: Four times a day (QID) | ORAL | Status: AC | PRN
  Administered 2013-06-05: 25 mg via ORAL
  Filled 2013-06-04: qty 1

## 2013-06-04 MED ORDER — CHLORDIAZEPOXIDE HCL 25 MG PO CAPS
25.0000 mg | ORAL_CAPSULE | ORAL | Status: AC
  Administered 2013-06-06: 25 mg via ORAL
  Filled 2013-06-04 (×2): qty 1

## 2013-06-04 MED ORDER — ALUM & MAG HYDROXIDE-SIMETH 200-200-20 MG/5ML PO SUSP: 30.0000 mL | ORAL | Status: DC | PRN

## 2013-06-04 MED ORDER — MAGNESIUM HYDROXIDE 400 MG/5ML PO SUSP: 30.0000 mL | Freq: Every day | ORAL | Status: DC | PRN

## 2013-06-04 MED ORDER — CHLORDIAZEPOXIDE HCL 25 MG PO CAPS
25.0000 mg | ORAL_CAPSULE | Freq: Three times a day (TID) | ORAL | Status: AC
  Administered 2013-06-05: 25 mg via ORAL
  Filled 2013-06-04 (×3): qty 1

## 2013-06-04 MED ORDER — LOPERAMIDE HCL 2 MG PO CAPS: 2.0000 mg | ORAL_CAPSULE | ORAL | Status: AC | PRN

## 2013-06-04 MED ORDER — ACETAMINOPHEN 325 MG PO TABS: 650.0000 mg | ORAL_TABLET | Freq: Four times a day (QID) | ORAL | Status: DC | PRN

## 2013-06-04 MED ORDER — CHLORDIAZEPOXIDE HCL 25 MG PO CAPS
25.0000 mg | ORAL_CAPSULE | Freq: Every day | ORAL | Status: DC
  Filled 2013-06-04: qty 1

## 2013-06-04 MED ORDER — NICOTINE 21 MG/24HR TD PT24
21.0000 mg | MEDICATED_PATCH | Freq: Every day | TRANSDERMAL | Status: DC
  Administered 2013-06-04 – 2013-06-07 (×4): 21 mg via TRANSDERMAL
  Filled 2013-06-04 (×6): qty 1

## 2013-06-04 NOTE — Progress Notes (Signed)
Admission Note D) 42 year old Pt who comes to [place] with a history of drinking 12-18 beers daily since May 04, 2013. Pt was at a long term care program in [place] after being there for 9 months. Pt states that he had brought his girlfriend there "because she has problems too" and was kicked out when they discovered his dishonesty. Pt states that he "let life get the better of me". Reports feelings of helplessness, hopelessness and "just not caring". Feels that "I would not be safe outside of the hospital. Pt. Also has legal charges pending presently and is wearing an electric ankle bracelet placed there by the police. Denies allergies.  A) Given support and reassurance. Oriented to the unit. Orders obtained for Pt. Encouraged to use his time here wisely and to reinvest in his life and his soberity. R) Pt states the reason he came here was "hoping to get back on the right track".

## 2013-06-04 NOTE — H&P (Signed)
Psychiatric Admission Assessment Adult  Patient Identification:  Chad Sanchez  Date of Evaluation:  06/04/2013  Chief Complaint:  ETOH Dependence  History of Present Illness: This is a 42 year old African-America male. Admitted to [facility] from the [facility] with complaints of alcohol intoxication requesting detoxifcation treatment. Patient reports, "The EMS took me to the hospital yesterday. I have been drinking too much alcohol and got intoxicated. I called the ambulance. I have been drinking everyday x 4 weeks. I drink mainly beer. I drink about 12 packs daily. Alcohol helps me to forget about the things that I'm worried about. I worry about life in General. I'm an alcoholic since I was 13. I have had DUIs in the past. I'm alienated from my family, had bad relationships, all because I drink too much. I have lived in [place] now x 4 weeks. That is how I started drinking again. I recently completed a substance abuse program that I started 2 years ago. And while in the program, they trained me too for some trade. It is called behavioral modification program for substance abusers. I came out here in [place] to work. I'm from [place], [place]. Now I feel like I messed up again big time. I feel very depressed. But I don't want depression pills. I get to be feeling anxious a lot".  Elements:  Location[facility]  [facility] adult unit. Quality:  Increased alcohol and drug use. Severity:  Severe. Timing:  "I have been using x 4 weeks. Duration:  "I have a long hx of drug use". Context:  Legal trouble, increased alcohol use, Anxiety.  Associated Signs/Synptoms:  Depression Symptoms:  depressed mood, insomnia, feelings of worthlessness/guilt, hopelessness,  (Hypo) Manic Symptoms:  Impulsivity,  Anxiety Symptoms:  Excessive Worry,  Psychotic Symptoms:  Hallucinations: None  PTSD Symptoms: Had a traumatic exposure:  None reported  Psychiatric Specialty Exam: Physical Exam  Constitutional: He is  oriented to person, place, and time. He appears well-developed and well-nourished.  HENT:  Head: Normocephalic.  Eyes: Pupils are equal, round, and reactive to light.  Neck: Normal range of motion.  Cardiovascular: Normal rate.   Respiratory: Effort normal.  GI: Soft.  Musculoskeletal: Normal range of motion.  Neurological: He is alert and oriented to person, place, and time.  Skin: Skin is warm and dry.  Psychiatric: His speech is normal and behavior is normal. Thought content normal. His mood appears anxious (Rated at #10). Cognition and memory are normal. He expresses impulsivity. He exhibits a depressed mood (rated at #8).    Review of Systems  Constitutional: Negative.   HENT: Negative.   Eyes: Negative.   Respiratory: Negative.   Cardiovascular: Negative.   Gastrointestinal: Positive for nausea.  Genitourinary: Negative.   Musculoskeletal: Positive for myalgias.  Skin: Negative.   Neurological: Positive for tremors.  Endo/Heme/Allergies: Negative.   Psychiatric/Behavioral: Positive for depression (Declines to be any an anti depressant) and substance abuse (Alcohol dependence, Cocaine abuse). Negative for suicidal ideas, hallucinations and memory loss. The patient is nervous/anxious (Rated at #10) and has insomnia.     Blood pressure 128/25, pulse 73, temperature 98.3 F (36.8 C), temperature source Oral, resp. rate 16, height 6\' 2"  (1.88 m), weight 68.947 kg (152 lb), SpO2 98.00%.Body mass index is 19.51 kg/(m^2).  General Appearance: Disheveled  Eye Solicitor::  Fair  Speech:  Clear and Coherent  Volume:  Normal  Mood:  Anxious and Depressed, does not want any antidepressants.  Affect:  Flat  Thought Process:  Coherent and  Goal Directed  Orientation:  Full (Time, Place, and Person)  Thought Content:  Rumination  Suicidal Thoughts:  No  Homicidal Thoughts:  No  Memory:  Immediate;   Good Recent;   Good Remote;   Good  Judgement:  Poor  Insight:  Fair  Psychomotor  Activity:  Normal  Concentration:  Fair  Recall:  Good  Akathisia:  No  Handed:  Right  AIMS (if indicated):     Assets:  Desire for Improvement  Sleep:       Past Psychiatric History: Diagnosis: Alcohol dependence, Cocaine dependence  Hospitalizations: [facility]  Outpatient Care: None reported  Substance Abuse Care: [facility] for substance abuse  Self-Mutilation: Denies  Suicidal Attempts: Denies attempts and thoughts  Violent Behaviors: None reported   Past Medical History:   Past Medical History  Diagnosis Date  . Alcohol abuse   . Depression    None.  Allergies:  No Known Allergies  PTA Medications: No prescriptions prior to admission    Previous Psychotropic Medications:  Medication/Dose  See medication lists               Substance Abuse History in the last 12 months:  yes  Consequences of Substance Abuse: Medical Consequences:  Liver damage, Possible death by overdose Legal Consequences:  Arrests, jail time, Loss of driving privilege. Family Consequences:  Family discord, divorce and or separation.  Social History:  reports that he has been smoking.  He does not have any smokeless tobacco history on file. He reports that he drinks about 7.2 ounces of alcohol per week. He reports that he does not use illicit drugs. Additional Social History: Pain Medications: None  Prescriptions: None  Over the Counter: None  History of alcohol / drug use?: Yes Longest period of sobriety (when/how long): None  Negative Consequences of Use: Work / School;Legal;Personal relationships;Financial Withdrawal Symptoms:  (no current symptoms) Name of Substance 1: Alcohol  1 - Age of First Use: 14 YOM  1 - Amount (size/oz): 12 pk  Current Place of Residence: [place], [address]   Place of Birth:  [place], [place]  Family Members: "My 3 grown children"  Marital Status:  Single  Children: 3  Sons: 2  Daughters: 1  Relationships: Single  Education:  [employer]  Financial planner Problems/Performance: Completed high school  Religious Beliefs/Practices: NA  History of Abuse (Emotional/Phsycial/Sexual): Denies  Occupational Experiences: English as a second language teacher History:  None.  Legal History: Pending court date 06/10/13  Hobbies/Interests: None reported  Family History:  History reviewed. No pertinent family history.  Results for orders placed during the hospital encounter of 06/03/13 (from the past 72 hour(s))  URINE RAPID DRUG SCREEN (HOSP PERFORMED)     Status: Abnormal   Collection Time    06/03/13  4:33 PM      Result Value Range   Opiates NONE DETECTED  NONE DETECTED   Cocaine POSITIVE (*) NONE DETECTED   Benzodiazepines NONE DETECTED  NONE DETECTED   Amphetamines NONE DETECTED  NONE DETECTED   Tetrahydrocannabinol NONE DETECTED  NONE DETECTED   Barbiturates NONE DETECTED  NONE DETECTED   Comment:            DRUG SCREEN FOR MEDICAL PURPOSES     ONLY.  IF CONFIRMATION IS NEEDED     FOR ANY PURPOSE, NOTIFY LAB     WITHIN 5 DAYS.                LOWEST DETECTABLE LIMITS  FOR URINE DRUG SCREEN     Drug Class       Cutoff (ng/mL)     Amphetamine      1000     Barbiturate      200     Benzodiazepine   200     Tricyclics       300     Opiates          300     Cocaine          300     THC              50  CBC WITH DIFFERENTIAL     Status: None   Collection Time    06/03/13  4:43 PM      Result Value Range   WBC 4.5  4.0 - 10.5 K/uL   RBC 5.45  4.22 - 5.81 MIL/uL   Hemoglobin 15.4  13.0 - 17.0 g/dL   HCT 16.1  09.6 - 04.5 %   MCV 80.0  78.0 - 100.0 fL   MCH 28.3  26.0 - 34.0 pg   MCHC 35.3  30.0 - 36.0 g/dL   RDW 40.9  81.1 - 91.4 %   Platelets 278  150 - 400 K/uL   Neutrophils Relative % 47  43 - 77 %   Neutro Abs 2.1  1.7 - 7.7 K/uL   Lymphocytes Relative 45  12 - 46 %   Lymphs Abs 2.0  0.7 - 4.0 K/uL   Monocytes Relative 5  3 - 12 %   Monocytes Absolute 0.2  0.1 - 1.0 K/uL   Eosinophils Relative 3  0 - 5 %    Eosinophils Absolute 0.1  0.0 - 0.7 K/uL   Basophils Relative 1  0 - 1 %   Basophils Absolute 0.0  0.0 - 0.1 K/uL  COMPREHENSIVE METABOLIC PANEL     Status: None   Collection Time    06/03/13  4:43 PM      Result Value Range   Sodium 139  135 - 145 mEq/L   Potassium 3.8  3.5 - 5.1 mEq/L   Chloride 101  96 - 112 mEq/L   CO2 25  19 - 32 mEq/L   Glucose, Bld 76  70 - 99 mg/dL   BUN 6  6 - 23 mg/dL   Creatinine, Ser 7.82  0.50 - 1.35 mg/dL   Calcium 9.1  8.4 - 95.6 mg/dL   Total Protein 8.1  6.0 - 8.3 g/dL   Albumin 4.2  3.5 - 5.2 g/dL   AST 27  0 - 37 U/L   ALT 18  0 - 53 U/L   Alkaline Phosphatase 64  39 - 117 U/L   Total Bilirubin 0.4  0.3 - 1.2 mg/dL   GFR calc non Af Amer >90  >90 mL/min   GFR calc Af Amer >90  >90 mL/min   Comment:            The eGFR has been calculated     using the CKD EPI equation.     This calculation has not been     validated in all clinical     situations.     eGFR's persistently     <90 mL/min signify     possible Chronic Kidney Disease.  ETHANOL     Status: Abnormal   Collection Time    06/03/13  4:43 PM      Result Value Range  Alcohol, Ethyl (B) 348 (*) 0 - 11 mg/dL   Comment:            LOWEST DETECTABLE LIMIT FOR     SERUM ALCOHOL IS 11 mg/dL     FOR MEDICAL PURPOSES ONLY  ETHANOL     Status: Abnormal   Collection Time    06/03/13 10:10 PM      Result Value Range   Alcohol, Ethyl (B) 201 (*) 0 - 11 mg/dL   Comment:            LOWEST DETECTABLE LIMIT FOR     SERUM ALCOHOL IS 11 mg/dL     FOR MEDICAL PURPOSES ONLY  ETHANOL     Status: Abnormal   Collection Time    06/04/13  4:25 AM      Result Value Range   Alcohol, Ethyl (B) 65 (*) 0 - 11 mg/dL   Comment:            LOWEST DETECTABLE LIMIT FOR     SERUM ALCOHOL IS 11 mg/dL     FOR MEDICAL PURPOSES ONLY   Psychological Evaluations:  Assessment:   AXIS I:  Alcohol dependence AXIS II:  Deferred AXIS III:   Past Medical History  Diagnosis Date  . Alcohol abuse   .  Depression    AXIS IV:  Alcoholism, legal problems AXIS V:  1-10 persistent dangerousness to self and others present  Treatment Plan/Recommendations: 1. Admit for crisis management and stabilization, estimated length of stay 3-5 days.  2. Medication management to reduce current symptoms to base line and improve the patient's overall level of functioning  3. Treat health problems as indicated.  4. Develop treatment plan to decrease risk of relapse upon discharge and the need for readmission.  5. Psycho-social education regarding relapse prevention and self care.  6. Health care follow up as needed for medical problems.  7. Review, reconcile, and reinstate any pertinent home medications for other health issues where appropriate. 8. Call for consults with hospitalist for any additional specialty patient care services as needed.  Treatment Plan Summary: Daily contact with patient to assess and evaluate symptoms and progress in treatment Medication management  Current Medications:  No current facility-administered medications for this encounter.    Observation Level/Precautions:  15 minute checks  Laboratory:  Reviewed ED lab findings on file  Psychotherapy: Group sessions, AA/NA meetings  Medications:  See medication lists  Consultations: As needed   Discharge Concerns:  Maintaining sobriety  Estimated LOS: 3-5 days  Other:     I certify that inpatient services furnished can reasonably be expected to improve the patient's condition.   Armandina Stammer I, PMHNP-BC 8/1/201410:41 AM  Seen and agreed. Thedore Mins, MD

## 2013-06-04 NOTE — [facility] Counselor (Signed)
Pt accepted to [facility], Dr. Dub Mikes, attending, [phone].  Pt can be transported after 8am. Support paperwork completed and faxed to [facility]

## 2013-06-04 NOTE — Tx Team (Signed)
Interdisciplinary Treatment Plan Update (Adult)  Date: 06/04/2013  Time Reviewed: Progress in Treatment:  Attending groups: No.  Participating in groups:  No.  Taking medication as prescribed: Yes  Tolerating medication: Yes  Family/Significant othe contact made: No. SPE not required for this pt.  Patient understands diagnosis: Yes, AEB seeking treatment for ETOH detox.  Discussing patient identified problems/goals with staff: Yes  Medical problems stabilized or resolved: Yes  Denies suicidal/homicidal ideation: Yes during admission/self report.  Patient has not harmed self or Others: Yes  New problem(s) identified: n/a  Discharge Plan or Barriers: Pt just arrived at [facility]. [name] and pt will explore aftercare options.  Additional comments:  Chad Sanchez is a 42 y.o. male who presents to [facility] voluntarily, requesting detox from alcohol. Pt denies SI/HI/Psych and able to contract for safety. Pt currently residing at the [facility] and has been living there since 05/04/13. Pt reports feeling depressed "forever" and "it's exhausting doing the same things and expecting different results". Pt has legal issues--stolen property charge(stole a cell phone) an court is 06/10/13. Pt has no current w/d sxs and has been provided an ativan protocol to help with sxs. Pt consumes 12 pk of beer daily, last drink was today, pt had 1-12oz beer. Pt has previous alcohol programs with [facility] and other rehab centers but was released from program, did not disclose reason.  Reason for Continuation of Hospitalization: Librium taper-withdrawals Estimated length of stay: 3-5 days  For review of initial/current patient goals, please see plan of care.  Attendees:  Patient:    Family:    Physician: Aggie PA 06/04/2013 12:57 PM   Nursing: Philippa Chester RN 06/04/2013 12:57 PM   Clinical Social Worker [employer], LCSWA  06/04/2013 12:57 PM   Other:    Other:    Other: Darden Dates Nurse CM 06/04/2013 12:57 PM   Other:    Scribe for  Treatment Team:  Trula Slade LCSWA 06/04/2013 12:57 PM

## 2013-06-04 NOTE — ED Provider Notes (Signed)
Act team indicates pt accepted to [facility], [facility] [place], bed ready. Pt alert, content, nad.   Suzi Roots, MD 06/04/13 [phone]

## 2013-06-04 NOTE — [facility] Group Notes (Signed)
[facility] LCSW Aftercare Discharge Planning Group Note   06/04/2013 9:56 AM  Participation Quality:  DID NOT ATTEND  Chad Sanchez, Chad Sanchez

## 2013-06-04 NOTE — ED Notes (Addendum)
Pt is awake and alert, pleasant and cooperative. Patient denies HI, SI AH or VH.  Last CIWA- EToH was 6 and discharge. Discharge vitals 114/74 Temp 98.2HR 75 RR 12 and unlabored. Pt has inpatient treatment scheduled at [facility]. Will continue to monitor for safety. Patient escorted to [facility] with [facility] security without incident. Pt has house arrest bracelet on left leg.  T.Melvyn Neth RN

## 2013-06-04 NOTE — [facility] Group Notes (Signed)
[facility] LCSW Group Therapy  06/04/2013 2:16 PM  Type of Therapy:  Group Therapy  Participation Level:  Active  Participation Quality:  Attentive  Affect:  Appropriate  Cognitive:  Alert  Insight:  Engaged  Engagement in Therapy:  Engaged  Modes of Intervention:  Discussion, Education, Exploration, Limit-setting, Socialization and Support  Summary of Progress/Problems: Feelings around Relapse. Group members discussed the meaning of relapse and shared personal stories of relapse, how it affected them and others, and how they perceived themselves during this time. Group members were encouraged to identify triggers, warning signs and coping skills used when facing the possibility of relapse. Social supports were discussed and explored in detail. Post Acute Withdrawal Syndrome (handout provided) was introduced and examined. Pt's were encouraged to ask questions, talk about key points associated with PAWS, and process this information in terms of relapse prevention. Cotey talked about his most recent relapse and his two years of sobriety while in the "intense [facility] program." He explained that after months of sobriety he stopped doing things that worked for him like going to [employer], rationalizing that he didn't HAVE to go to stay sober. Diyan shows improved insight by his ability to acknowledge that maintenance is just as important as seeking treatment for substance abuse/detox. Matisse asked questions relating to PAWS and felt that knowing more about PAWS will help him come up with a safety plan to avoid relapse during the next two years of his life.    Smart, Dorion Petillo 06/04/2013, 2:16 PM

## 2013-06-04 NOTE — Progress Notes (Signed)
Adult Psychoeducational Group Note  Date:  06/04/2013 Time:  2:55 PM  Group Topic/Focus:  Early Warning Signs:   The focus of this group is to help patients identify signs or symptoms they exhibit before slipping into an unhealthy state or crisis.  Participation Level:  Active  Participation Quality:  Appropriate  Affect:  Appropriate and Flat  Cognitive:  Appropriate  Insight: Good  Engagement in Group:  Developing/Improving, Engaged and Limited  Modes of Intervention:  Discussion, Education, Problem-solving, Socialization and Support  Additional Comments:  Pt. Watched and listened for the first part without speaking. Pt. Was able to share personal stories and provided symptoms and behaviors that are warning signs to relapse.   Chad Sanchez 06/04/2013, 2:55 PM

## 2013-06-04 NOTE — [facility] Suicide Risk Assessment (Signed)
[facility] INPATIENT: Family/Significant Other Suicide Prevention Education  Suicide Prevention Education:  Education Completed; No one has been identified by the patient as the family member/significant other with whom the patient will be residing, and identified as the person(s) who will aid the patient in the event of a mental health crisis (suicidal ideations/suicide attempt).   Pt did not c/o SI at admission, nor have they endorsed SI during their stay here. SPE not required.  [employer], LCSWA 06/04/2013 5:20 PM

## 2013-06-04 NOTE — Progress Notes (Signed)
Adult Psychoeducational Group Note  Date:  06/04/2013 Time:  10:28 PM  Group Topic/Focus:  Aa group  Participation Level:  Active  Participation Quality:  Appropriate  Affect:  Appropriate  Cognitive:  Alert  Insight: Appropriate  Engagement in Group:  Engaged  Modes of Intervention:  Discussion  Additional Comments:    Flonnie Hailstone 06/04/2013, 10:28 PM

## 2013-06-04 NOTE — Progress Notes (Signed)
Patient ID: Chad Sanchez, male   DOB: 1970-12-01, 42 y.o.   MRN: [id number]  D: Patient pleasant on approach tonight. Reports that he is doing good with the withdrawals thus far. Encouraged to drink lots of fluids tonight. Given some gatorade at this time. Patient remains on librium protocol. A: Staff will monitor on q 15 minute checks, follow treatment plan, and give meds as ordered. R: Went down to group at this time.

## 2013-06-05 MED ORDER — TRAZODONE HCL 50 MG PO TABS
50.0000 mg | ORAL_TABLET | Freq: Every evening | ORAL | Status: DC | PRN
  Administered 2013-06-05 – 2013-06-06 (×2): 50 mg via ORAL
  Filled 2013-06-05: qty 28
  Filled 2013-06-05 (×2): qty 1

## 2013-06-05 NOTE — Progress Notes (Signed)
Patient did not attend the evening speaker AA meeting. Pt remained in bed during meeting.   

## 2013-06-05 NOTE — Progress Notes (Signed)
[facility] MD Progress Note  06/05/2013 11:41 AM Chad Sanchez  MRN:  [id number]  Subjective:  Alcohol abuse started one month ago, upset about leaving [facility]--felt like he lost everything, and messed up.  He feels better now that he is going to rehab after his court date on 8/14 for tickets, being released from his charges and plans to go to rehab.  Sleep good, appetite fair, 2/10 depression because he has hope with the rehab, 2/10 anxiety, feels he is ready for discharge to stay at [facility] and feels he will be able to stay sober, looking for Monday to discharge.  Diagnosis:   Axis I: Alcohol Abuse, Anxiety Disorder NOS, Depressive Disorder NOS and Substance Induced Mood Disorder Axis II: Deferred Axis III:  Past Medical History  Diagnosis Date  . Alcohol abuse   . Depression    Axis IV: other psychosocial or environmental problems, problems related to social environment and problems with primary support group Axis V: 41-50 serious symptoms  ADL's:  Intact  Sleep: Good  Appetite:  Fair  Suicidal Ideation:  Denies- Homicidal Ideation:  Denies  Psychiatric Specialty Exam: Review of Systems  Constitutional: Negative.   HENT: Negative.   Eyes: Negative.   Respiratory: Negative.   Cardiovascular: Negative.   Gastrointestinal: Negative.   Genitourinary: Negative.   Musculoskeletal: Negative.   Skin: Negative.   Neurological: Negative.   Endo/Heme/Allergies: Negative.   Psychiatric/Behavioral: Positive for depression and substance abuse. The patient is nervous/anxious.     Blood pressure 118/76, pulse 77, temperature 97 F (36.1 C), temperature source Oral, resp. rate 16, height 6\' 2"  (1.88 m), weight 68.947 kg (152 lb), SpO2 98.00%.Body mass index is 19.51 kg/(m^2).  General Appearance: Casual  Eye Contact::  Fair  Speech:  Normal Rate  Volume:  Normal  Mood:  Anxious and Depressed  Affect:  Congruent  Thought Process:  Coherent  Orientation:  Full (Time, Place, and  Person)  Thought Content:  WDL  Suicidal Thoughts:  No  Homicidal Thoughts:  No  Memory:  Immediate;   Fair Recent;   Fair Remote;   Fair  Judgement:  Fair  Insight:  Fair  Psychomotor Activity:  Normal  Concentration:  Fair  Recall:  Fair  Akathisia:  No  Handed:  Right  AIMS (if indicated):     Assets:  Communication Skills Resilience  Sleep:  Number of Hours: 6.5   Current Medications: Current Facility-Administered Medications  Medication Dose Route Frequency Provider Last Rate Last Dose  . acetaminophen (TYLENOL) tablet 650 mg  650 mg Oral Q6H PRN Sanjuana Kava, NP      . alum & mag hydroxide-simeth (MAALOX/MYLANTA) 200-200-20 MG/5ML suspension 30 mL  30 mL Oral Q4H PRN Sanjuana Kava, NP      . chlordiazePOXIDE (LIBRIUM) capsule 25 mg  25 mg Oral Q6H PRN Sanjuana Kava, NP      . chlordiazePOXIDE (LIBRIUM) capsule 25 mg  25 mg Oral TID Sanjuana Kava, NP   25 mg at 06/05/13 4098   Followed by  . [START ON 06/06/2013] chlordiazePOXIDE (LIBRIUM) capsule 25 mg  25 mg Oral BH-qamhs Sanjuana Kava, NP       Followed by  . [START ON 06/07/2013] chlordiazePOXIDE (LIBRIUM) capsule 25 mg  25 mg Oral Daily Sanjuana Kava, NP      . hydrOXYzine (ATARAX/VISTARIL) tablet 25 mg  25 mg Oral Q6H PRN Sanjuana Kava, NP      . loperamide (IMODIUM) capsule 2-4  mg  2-4 mg Oral PRN Sanjuana Kava, NP      . magnesium hydroxide (MILK OF MAGNESIA) suspension 30 mL  30 mL Oral Daily PRN Sanjuana Kava, NP      . multivitamin with minerals tablet 1 tablet  1 tablet Oral Daily Sanjuana Kava, NP   1 tablet at 06/05/13 [phone]  . nicotine (NICODERM CQ - dosed in mg/24 hours) patch 21 mg  21 mg Transdermal Q0600 Sanjuana Kava, NP   21 mg at 06/05/13 0630  . ondansetron (ZOFRAN-ODT) disintegrating tablet 4 mg  4 mg Oral Q6H PRN Sanjuana Kava, NP      . thiamine (VITAMIN B-1) tablet 100 mg  100 mg Oral Daily Sanjuana Kava, NP   100 mg at 06/05/13 1191    Lab Results:  Results for orders placed during the hospital  encounter of 06/03/13 (from the past 48 hour(s))  URINE RAPID DRUG SCREEN (HOSP PERFORMED)     Status: Abnormal   Collection Time    06/03/13  4:33 PM      Result Value Range   Opiates NONE DETECTED  NONE DETECTED   Cocaine POSITIVE (*) NONE DETECTED   Benzodiazepines NONE DETECTED  NONE DETECTED   Amphetamines NONE DETECTED  NONE DETECTED   Tetrahydrocannabinol NONE DETECTED  NONE DETECTED   Barbiturates NONE DETECTED  NONE DETECTED   Comment:            DRUG SCREEN FOR MEDICAL PURPOSES     ONLY.  IF CONFIRMATION IS NEEDED     FOR ANY PURPOSE, NOTIFY LAB     WITHIN 5 DAYS.                LOWEST DETECTABLE LIMITS     FOR URINE DRUG SCREEN     Drug Class       Cutoff (ng/mL)     Amphetamine      1000     Barbiturate      200     Benzodiazepine   200     Tricyclics       300     Opiates          300     Cocaine          300     THC              50  CBC WITH DIFFERENTIAL     Status: None   Collection Time    06/03/13  4:43 PM      Result Value Range   WBC 4.5  4.0 - 10.5 K/uL   RBC 5.45  4.22 - 5.81 MIL/uL   Hemoglobin 15.4  13.0 - 17.0 g/dL   HCT 47.8  29.5 - 62.1 %   MCV 80.0  78.0 - 100.0 fL   MCH 28.3  26.0 - 34.0 pg   MCHC 35.3  30.0 - 36.0 g/dL   RDW 30.8  65.7 - 84.6 %   Platelets 278  150 - 400 K/uL   Neutrophils Relative % 47  43 - 77 %   Neutro Abs 2.1  1.7 - 7.7 K/uL   Lymphocytes Relative 45  12 - 46 %   Lymphs Abs 2.0  0.7 - 4.0 K/uL   Monocytes Relative 5  3 - 12 %   Monocytes Absolute 0.2  0.1 - 1.0 K/uL   Eosinophils Relative 3  0 - 5 %   Eosinophils Absolute 0.1  0.0 - 0.7 K/uL   Basophils Relative 1  0 - 1 %   Basophils Absolute 0.0  0.0 - 0.1 K/uL  COMPREHENSIVE METABOLIC PANEL     Status: None   Collection Time    06/03/13  4:43 PM      Result Value Range   Sodium 139  135 - 145 mEq/L   Potassium 3.8  3.5 - 5.1 mEq/L   Chloride 101  96 - 112 mEq/L   CO2 25  19 - 32 mEq/L   Glucose, Bld 76  70 - 99 mg/dL   BUN 6  6 - 23 mg/dL   Creatinine,  Ser 7.84  0.50 - 1.35 mg/dL   Calcium 9.1  8.4 - 69.6 mg/dL   Total Protein 8.1  6.0 - 8.3 g/dL   Albumin 4.2  3.5 - 5.2 g/dL   AST 27  0 - 37 U/L   ALT 18  0 - 53 U/L   Alkaline Phosphatase 64  39 - 117 U/L   Total Bilirubin 0.4  0.3 - 1.2 mg/dL   GFR calc non Af Amer >90  >90 mL/min   GFR calc Af Amer >90  >90 mL/min   Comment:            The eGFR has been calculated     using the CKD EPI equation.     This calculation has not been     validated in all clinical     situations.     eGFR's persistently     <90 mL/min signify     possible Chronic Kidney Disease.  ETHANOL     Status: Abnormal   Collection Time    06/03/13  4:43 PM      Result Value Range   Alcohol, Ethyl (B) 348 (*) 0 - 11 mg/dL   Comment:            LOWEST DETECTABLE LIMIT FOR     SERUM ALCOHOL IS 11 mg/dL     FOR MEDICAL PURPOSES ONLY  ETHANOL     Status: Abnormal   Collection Time    06/03/13 10:10 PM      Result Value Range   Alcohol, Ethyl (B) 201 (*) 0 - 11 mg/dL   Comment:            LOWEST DETECTABLE LIMIT FOR     SERUM ALCOHOL IS 11 mg/dL     FOR MEDICAL PURPOSES ONLY  ETHANOL     Status: Abnormal   Collection Time    06/04/13  4:25 AM      Result Value Range   Alcohol, Ethyl (B) 65 (*) 0 - 11 mg/dL   Comment:            LOWEST DETECTABLE LIMIT FOR     SERUM ALCOHOL IS 11 mg/dL     FOR MEDICAL PURPOSES ONLY    Physical Findings: AIMS: Facial and Oral Movements Muscles of Facial Expression: None, normal Lips and Perioral Area: None, normal Jaw: None, normal Tongue: None, normal,Extremity Movements Upper (arms, wrists, hands, fingers): None, normal Lower (legs, knees, ankles, toes): None, normal, Trunk Movements Neck, shoulders, hips: None, normal, Overall Severity Severity of abnormal movements (highest score from questions above): None, normal Incapacitation due to abnormal movements: None, normal Patient's awareness of abnormal movements (rate only patient's report): No Awareness,  Dental Status Current problems with teeth and/or dentures?: No Does patient usually wear dentures?: No  CIWA:  CIWA-Ar Total: 0 COWS:  Treatment Plan Summary: Daily contact with patient to assess and evaluate symptoms and progress in treatment Medication management  Plan:  Review of chart, vital signs, medications, and notes. 1-Individual and group therapy 2-Medication management for depression and anxiety:  Medications reviewed with the patient and he stated no untoward effects, no changes made 3-Coping skills for depression, anxiety, and alcohol abuse 4-Continue crisis stabilization and management 5-Address health issues--monitoring vital signs, stable 6-Treatment plan in progress to prevent relapse of depression, alcohol abuse, and anxiety  Medical Decision Making Problem Points:  Established problem, stable/improving (1) and Review of psycho-social stressors (1) Data Points:  Review of medication regiment & side effects (2)  I certify that inpatient services furnished can reasonably be expected to improve the patient's condition.   Nanine Means, PMH-NP 06/05/2013, 11:41 AM  Reviewed the information documented and agree with the treatment plan.  Tenn[place] Perra,JANARDHAHA R. 06/07/2013 12:11 PM

## 2013-06-05 NOTE — [facility] Group Notes (Signed)
[facility] Group Notes:  (Clinical Social Work)  06/05/2013     10-11AM  Summary of Progress/Problems:   The main focus of today's process group was for the patient to identify ways in which they have in the past sabotaged their own recovery. Motivational Interviewing was utilized to ask the group members what they get out of their substance use, and what reasons they may have for wanting to change.  The Stages of Change were explained using a handout, and patients identified where they currently are with regard to stages of change.  The patient expressed that he only does the right thing for his life when his back is against the wall, and that he self-sabotages by introducing chaos into his life when he starts to have too much success.  He stated he does not know how to be happy, has been in counseling before but was not on medication for depression.  He is starting to wonder if this is just who he is.  He knew he was really going downhill when he started isolating himself to drink because he had always previously been a party person.  He has been at [facility] for 2 years in the past, and feels that this is the worst he has ever been currently.  He wants to go to [facility] for rehabilitation, and is in the Preparation Stage.  Type of Therapy:  Group Therapy - Process   Participation Level:  Active  Participation Quality:  Attentive, Sharing and Supportive  Affect:  Blunted and Depressed  Cognitive:  Alert and Appropriate  Insight:  Engaged  Engagement in Therapy:  Engaged  Modes of Intervention:  Education, Teacher, English as a foreign language, Motivational Interviewing  Ambrose Mantle, LCSW 06/05/2013, 11:57 AM

## 2013-06-05 NOTE — [facility] Group Notes (Signed)
[facility] Group Notes:  (Nursing/MHT/Case Management/Adjunct)  Date:  06/05/2013  Time:  2:16 PM  Type of Therapy:  Psychoeducational Skills  Participation Level:  Did Not Attend   Chad Sanchez 06/05/2013, 2:16 PM 

## 2013-06-05 NOTE — Progress Notes (Signed)
Psychoeducational Group Note  Date:  06/05/2013 Time: 1530 Group Topic/Focus:  Healthy Communication:   The focus of this group is to discuss communication, barriers to communication, as well as healthy ways to communicate with others.  Participation Level: Did Not Attend  Participation Quality:  Not Applicable  Affect:  Not Applicable  Cognitive:  Not Applicable  Insight:  Not Applicable  Engagement in Group: Not Applicable  Additional Comments:  Refused to attend group after notification from staff  Gwenevere Ghazi Patience 06/05/2013, 6:51 PM

## 2013-06-05 NOTE — Progress Notes (Signed)
D.  Pt did not attend evening AA group, remained in bed.  Charged leg bracelet this evening.  Interacting appropriately with peers on unit.  Pleasant on approach, no complaints voiced other than insomnia.  Denies SI/HI/hallucinations at this time.  A.  Support and encouragement offered  R.  Pt remains safe on unit, will continue to monitor.

## 2013-06-05 NOTE — Progress Notes (Signed)
D patient slept well last nite, appetite is good, going to DR for meals, energy level is normal and ability to pay attention is good, his depression is 2/10 and hopelessness is 2/10 today, he is not having any WD s/s and he denies Si or Hi and ho AVH, his goal today is to stop drinking, taking Librium and other meds as ordered by MD, pleasant and cooperative, attending group and participating. A q13min safety checks continue and support offered, encouraged to continue taking meds and attending group R patient remains safe on the unit

## 2013-06-06 DIAGNOSIS — F329 Major depressive disorder, single episode, unspecified: Secondary | ICD-10-CM

## 2013-06-06 DIAGNOSIS — F1994 Other psychoactive substance use, unspecified with psychoactive substance-induced mood disorder: Secondary | ICD-10-CM

## 2013-06-06 DIAGNOSIS — F411 Generalized anxiety disorder: Secondary | ICD-10-CM

## 2013-06-06 DIAGNOSIS — F1010 Alcohol abuse, uncomplicated: Secondary | ICD-10-CM

## 2013-06-06 NOTE — Progress Notes (Signed)
Adult Psychoeducational Group Note  Date:  06/06/2013 Time:  8:00PM Group Topic/Focus:  Wrap-Up Group:   The focus of this group is to help patients review their daily goal of treatment and discuss progress on daily workbooks.  Participation Level:  Active  Participation Quality:  Appropriate and Attentive  Affect:  Appropriate  Cognitive:  Alert and Appropriate  Insight: Appropriate  Engagement in Group:  Engaged  Modes of Intervention:  Discussion  Additional Comments:  Pt. Attended AA wrap up group meeting.   Bing Plume D 06/06/2013, 8:18 PM

## 2013-06-06 NOTE — Progress Notes (Signed)
Patient ID: Chad Sanchez, male   DOB: 1971/05/23, 42 y.o.   MRN: [id number]  D: Pt has been flat and depressed on the unit today, pt has been in the bed most of the day. Pt has not attended any groups, and has not engaged in any treatment. Pt has requested most of the day to be discharged, he was told by the doctor that discharge was not an option. Pt reported being negative SI/HI, no AH/VH noted. A: 15 min checks continued for patient safety. R: Pts safety maintained.

## 2013-06-06 NOTE — Progress Notes (Signed)
Patient ID: Chad Sanchez, male   DOB: Sep 13, 1971, 42 y.o.   MRN: [id number] [facility] MD Progress Note  06/06/2013 2:11 PM Chad Sanchez  MRN:  [id number]  Subjective:  Chad Sanchez reports that he is feeling some better today. Says that he is ready to be discharged. Says the Social worker had informed him that he will be going to the [facility] in about 2 weeks to continue treatment. Has his court date on 06/17/13 for suspected arm robbery charge, which he claimed was not true. He says he was just at the wrong place at the wrong time with someone he used to know. He is hopeful that the charge will be dropped. He wears an ankle (monitor) bracelet. He denies any SIHI, AVH, Denies any withdrawal symptoms.  Diagnosis:   Axis I: Alcohol Abuse, Anxiety Disorder NOS, Depressive Disorder NOS and Substance Induced Mood Disorder Axis II: Deferred Axis III:  Past Medical History  Diagnosis Date  . Alcohol abuse   . Depression    Axis IV: other psychosocial or environmental problems, problems related to social environment and problems with primary support group Axis V: 41-50 serious symptoms  ADL's:  Intact  Sleep: Good  Appetite:  Fair  Suicidal Ideation:  Denies- Homicidal Ideation:  Denies  Psychiatric Specialty Exam: Review of Systems  Constitutional: Negative.   HENT: Negative.   Eyes: Negative.   Respiratory: Negative.   Cardiovascular: Negative.   Gastrointestinal: Negative.   Genitourinary: Negative.   Musculoskeletal: Negative.   Skin: Negative.   Neurological: Negative.   Endo/Heme/Allergies: Negative.   Psychiatric/Behavioral: Positive for depression and substance abuse. The patient is nervous/anxious.     Blood pressure 108/77, pulse 101, temperature 97 F (36.1 C), temperature source Oral, resp. rate 16, height 6\' 2"  (1.88 m), weight 68.947 kg (152 lb), SpO2 98.00%.Body mass index is 19.51 kg/(m^2).  General Appearance: Casual  Eye Contact::  Fair  Speech:  Normal Rate   Volume:  Normal  Mood:  Anxious and Depressed  Affect:  Congruent  Thought Process:  Coherent  Orientation:  Full (Time, Place, and Person)  Thought Content:  WDL  Suicidal Thoughts:  No  Homicidal Thoughts:  No  Memory:  Immediate;   Fair Recent;   Fair Remote;   Fair  Judgement:  Fair  Insight:  Fair  Psychomotor Activity:  Normal  Concentration:  Fair  Recall:  Fair  Akathisia:  No  Handed:  Right  AIMS (if indicated):     Assets:  Communication Skills Resilience  Sleep:  Number of Hours: 6.25   Current Medications: Current Facility-Administered Medications  Medication Dose Route Frequency Provider Last Rate Last Dose  . acetaminophen (TYLENOL) tablet 650 mg  650 mg Oral Q6H PRN Sanjuana Kava, NP      . alum & mag hydroxide-simeth (MAALOX/MYLANTA) 200-200-20 MG/5ML suspension 30 mL  30 mL Oral Q4H PRN Sanjuana Kava, NP      . chlordiazePOXIDE (LIBRIUM) capsule 25 mg  25 mg Oral Q6H PRN Sanjuana Kava, NP   25 mg at 06/05/13 2132  . chlordiazePOXIDE (LIBRIUM) capsule 25 mg  25 mg Oral BH-qamhs Sanjuana Kava, NP       Followed by  . [START ON 06/07/2013] chlordiazePOXIDE (LIBRIUM) capsule 25 mg  25 mg Oral Daily Sanjuana Kava, NP      . hydrOXYzine (ATARAX/VISTARIL) tablet 25 mg  25 mg Oral Q6H PRN Sanjuana Kava, NP   25 mg at 06/05/13 2132  . loperamide (  IMODIUM) capsule 2-4 mg  2-4 mg Oral PRN Sanjuana Kava, NP      . magnesium hydroxide (MILK OF MAGNESIA) suspension 30 mL  30 mL Oral Daily PRN Sanjuana Kava, NP      . multivitamin with minerals tablet 1 tablet  1 tablet Oral Daily Sanjuana Kava, NP   1 tablet at 06/05/13 [phone]  . nicotine (NICODERM CQ - dosed in mg/24 hours) patch 21 mg  21 mg Transdermal Q0600 Sanjuana Kava, NP   21 mg at 06/06/13 0609  . ondansetron (ZOFRAN-ODT) disintegrating tablet 4 mg  4 mg Oral Q6H PRN Sanjuana Kava, NP      . thiamine (VITAMIN B-1) tablet 100 mg  100 mg Oral Daily Sanjuana Kava, NP   100 mg at 06/05/13 0808  . traZODone (DESYREL)  tablet 50 mg  50 mg Oral QHS PRN,MR X 1 Nehemiah Settle, MD   50 mg at 06/05/13 2131    Lab Results:  No results found for this or any previous visit (from the past 48 hour(s)).  Physical Findings: AIMS: Facial and Oral Movements Muscles of Facial Expression: None, normal Lips and Perioral Area: None, normal Jaw: None, normal Tongue: None, normal,Extremity Movements Upper (arms, wrists, hands, fingers): None, normal Lower (legs, knees, ankles, toes): None, normal, Trunk Movements Neck, shoulders, hips: None, normal, Overall Severity Severity of abnormal movements (highest score from questions above): None, normal Incapacitation due to abnormal movements: None, normal Patient's awareness of abnormal movements (rate only patient's report): No Awareness, Dental Status Current problems with teeth and/or dentures?: No Does patient usually wear dentures?: No  CIWA:  CIWA-Ar Total: 0 COWS:     Treatment Plan Summary: Daily contact with patient to assess and evaluate symptoms and progress in treatment Medication management  Supportive approach/coping skills/relapse prevention. Encouraged out of room, participation in group sessions and application of coping skills when distressed. Will continue to monitor response to/adverse effects of medications in use to assure effectiveness. Continue to monitor mood, behavior and interaction with staff and other patients. Discharge plans in progress. Continue current plan of care.  Medical Decision Making Problem Points:  Established problem, stable/improving (1) and Review of psycho-social stressors (1) Data Points:  Review of medication regiment & side effects (2)  I certify that inpatient services furnished can reasonably be expected to improve the patient's condition.   Armandina Stammer I, PMH-NP 06/06/2013, 2:11 PM

## 2013-06-06 NOTE — Progress Notes (Signed)
Pt. Denies any SI, HI withdrawals or depression at this time,  Pt. Is anxious to discharge tomorrow and feels he can remain sober until his court date on the 14th.  He states he will then go to [facility].  No pain  reported.

## 2013-06-06 NOTE — [facility] Group Notes (Signed)
[facility] Group Notes:  (Clinical Social Work)  06/06/2013  10:00-11:00AM  Summary of Progress/Problems:   The main focus of today's process group was to   identify the patient's current support system and decide on other supports that can be put in place.  Four definitions/levels of support were discussed and an exercise was utilized to show how much stronger we become with additional supports.  An emphasis was placed on using counselor, doctor, therapy groups, 12-step groups, and problem-specific support groups to expand supports, as well as doing something different than has been done before. The patient stated at group start that he has no supports.  He did a lot of internal processing throughout group, and came to the conclusion that he has rejected a lot of support in his life because he has wanted the support on his own terms, i.e., has wanted to hear the word "yes".  At end of group, he expressed a willingness to contact previous supports he has rejected when they did not give him what he wanted.  Type of Therapy:  Process Group with Motivational Interviewing  Participation Level:  Active  Participation Quality:  Attentive, Sharing and Supportive  Affect:  Blunted and Depressed  Cognitive:  Appropriate and Oriented  Insight:  Engaged  Engagement in Therapy:  Engaged  Modes of Intervention:   Education, Support and Processing, Activity  [employer], LCSW 06/06/2013, 12:15 PM

## 2013-06-06 NOTE — [facility] Group Notes (Signed)
[facility] Group Notes:  (Nursing/MHT/Case Management/Adjunct)  Date:  06/06/2013  Time:  2:30 PM  Type of Therapy: Psychoeducational Skills  Participation Level: Did Not Attend     Chad Sanchez 06/06/2013, 2:30 PM

## 2013-06-07 DIAGNOSIS — F1020 Alcohol dependence, uncomplicated: Principal | ICD-10-CM

## 2013-06-07 MED ORDER — TRAZODONE HCL 50 MG PO TABS
50.0000 mg | ORAL_TABLET | Freq: Every evening | ORAL | Status: DC | PRN
Start: 2013-06-07 — End: 2013-08-14

## 2013-06-07 NOTE — Progress Notes (Signed)
[facility] Adult Case Management Discharge Plan :  Will you be returning to the same living situation after discharge: Yes,  living [facility] until [facility] At discharge, do you have transportation home?:Yes. Bus pass Do you have the ability to pay for your medications:Yes,  mental health  Release of information consent forms completed and in the chart;  Patient's signature needed at discharge.  Patient to Follow up at: Follow-up Information   Follow up with [facility]. (Walk in between 8am-9am Monday through Friday for hospital followup.)    Contact information:   [address], [place] [address] phone: [phone] fax: [phone]      Follow up with [facility]. (Left messages for Victorino Dike and Trey Paula to get admission date for after 8/15. Please call [phone] to followup on this if date not provided by discharge today. )    Contact information:   [address] [place], [place] [address] phone: [phone] fax: [phone]      Patient denies SI/HI:   Yes,  during admission, group, and self report    Safety Planning and Suicide Prevention discussed:  Yes,  SPE not required for this pt as he did not endorse SI during admission or during stay at [facility]. SPI pamphlet provided to pt and he was encouraged to ask questions and talk about any concerns relating to SPE.  Smart, Tenee Wish 06/07/2013, 11:34 AM

## 2013-06-07 NOTE — [facility] Suicide Risk Assessment (Signed)
Suicide Risk Assessment  Discharge Assessment     Demographic Factors:  Male  Mental Status Per Nursing Assessment::   On Admission:  Suicidal ideation indicated by patient;Self-harm thoughts  Current Mental Status by Physician: In full contact with reality. There are no suicidal ideas, plans or intent. His mood is euthymic, his affect is appropriate. He is very insightful and is able to articulate the things he needs to do to stay abstinent   Loss Factors: NA  Historical Factors: NA  Risk Reduction Factors:   Positive social support  Continued Clinical Symptoms:  Depression:   Comorbid alcohol abuse/dependence Alcohol/Substance Abuse/Dependencies  Cognitive Features That Contribute To Risk: none identified    Suicide Risk:  Minimal: No identifiable suicidal ideation.  Patients presenting with no risk factors but with morbid ruminations; may be classified as minimal risk based on the severity of the depressive symptoms  Discharge Diagnoses:   AXIS I:  Substance Induced Mood Disorder, Alcohol Dependence AXIS II:  Deferred AXIS III:   Past Medical History  Diagnosis Date  . Alcohol abuse   . Depression    AXIS IV:  other psychosocial or environmental problems AXIS V:  61-70 mild symptoms  Plan Of Care/Follow-up recommendations:  Activity:  as tolerated Diet:  regular Follow up outpatient basis Is patient on multiple antipsychotic therapies at discharge:  No   Has Patient had three or more failed trials of antipsychotic monotherapy by history:  No  Recommended Plan for Multiple Antipsychotic Therapies: N/A   Lafaye Mcelmurry A 06/07/2013, 12:59 PM

## 2013-06-07 NOTE — Progress Notes (Signed)
Adult Psychoeducational Group Note  Date:  06/07/2013 Time:  3:37 PM  Group Topic/Focus:  Self Care:   The focus of this group is to help patients understand the importance of self-care in order to improve or restore emotional, physical, spiritual, interpersonal, and financial health.  Participation Level:  Minimal  Participation Quality:  Appropriate and Attentive  Affect:  Appropriate  Cognitive:  Appropriate  Insight: Improving  Engagement in Group:  Developing/Improving  Modes of Intervention:  Activity, Discussion and Socialization  Additional Comments:  Chad Sanchez attended and shared at times during group. Patient defined self-care in own terms, along with the completion of self-care assessment. In the assessment patient was asked to rate areas based on psychological, physical, emotional, spiritual, relationship care. Patient shared weakness and strengths and was asked to make a goal on the area of weakness and how improvement can be made.   Chad Sanchez 06/07/2013, 3:37 PM

## 2013-06-07 NOTE — Discharge Summary (Signed)
Physician Discharge Summary Note  Patient:  Chad Sanchez is an 42 y.o., male MRN:  [id number] DOB:  1970-11-06 Patient phone:  [phone] (home)  Patient address:   [address] [place] [address],   Date of Admission:  06/04/2013 Date of Discharge: 06/07/13  Reason for Admission:  Alcohol intoxication  Discharge Diagnoses: Principal Problem:   Alcohol dependence Active Problems:   Substance induced mood disorder  Review of Systems  Constitutional: Negative.   HENT: Negative.   Eyes: Negative.   Respiratory: Negative.   Cardiovascular: Negative.   Gastrointestinal: Negative.   Genitourinary: Negative.   Skin: Negative.   Neurological: Negative.   Endo/Heme/Allergies: Negative.   Psychiatric/Behavioral: Positive for substance abuse (Alcoholism). Negative for depression, suicidal ideas, hallucinations and memory loss. The patient has insomnia (Stabilized with medication prior to discharge). The patient is not nervous/anxious.    Axis Diagnosis:   AXIS I:  Substance Induced Mood Disorder, Alcohol dependence  AXIS II:  Deferred AXIS III:   Past Medical History  Diagnosis Date  . Alcohol abuse   . Depression    AXIS IV:  economic problems, occupational problems, other psychosocial or environmental problems and Substance abuse AXIS V:  64  Level of Care:  OP  Hospital Course:  This is a 42 year old African-America male. Admitted to [facility] from the [facility] with complaints of alcohol intoxication requesting detoxifcation treatment. Patient reports, "The EMS took me to the hospital yesterday. I have been drinking too much alcohol and got intoxicated. I called the ambulance. I have been drinking everyday x 4 weeks. I drink mainly beer. I drink about 12 packs daily. Alcohol helps me to forget about the things that I'm worried about. I worry about life in General. I'm an alcoholic since I was 13. I have had DUIs in the past. I'm alienated from my  family, had bad relationships, all because I drink too much. I have lived in [place] now x 4 weeks. That is how I started drinking again. I recently completed a substance abuse program that I started 2 years ago. And while in the program, they trained me too for some trade. It is called behavioral modification program for substance abusers. I came out here in [place] to work. I'm from [place], [place]. Now I feel like I messed up again big time. I feel very depressed. But I don't want depression pills. I get to be feeling anxious a lot".  Upon admission into this hospital, and after admission assessment/evaluation coupled with UDS/Toxicology reports, it was determined that Chad Sanchez will need detoxification treatment protocol to stabilize his systems of alcohol/drug intoxications and to combat the withdrawal symptoms of these substances as well.  He was then started on Librium treatment protocol. He was also enrolled in group counseling sessions and activities where he was counseled and learned coping skills that should help him after discharge to cope better and manage his substance abuse issues to sustain a much longer sobriety. He also attended AA/NA meetings being offered and held on this unit. He presented no other previously existing and or identifiable medical conditions that required treatment and or monitoring. However, he was monitored closely for any potential problems that may arise as a result of and or during detoxification treatment. Patient tolerated his treatment regimen and detoxification treatment protocol without any significant adverse effects and or reactions presented.  Patient attended treatment team meeting this am and met with the treatment team members. His reason for admission,  present symptoms, substance abuse issues, response to treatment and discharge plans discussed. Patient endorsed that he is doing well and stable for discharge to pursue the next phase of his substance abuse  treatment. It was then agreed upon that he will continue substance abuse treatment at the [facility] on and or after 06/18/13. This was setup as such because patient has court date scheduled for 06/17/13. It was will to his best interest to be present for this court hearing. He has been provided with the contact information for [facility]. Besides the treatments received here and scheduled outpatient psychiatric services , patient was encouraged to join/attend AA/NA meetings offered and held within his community. He was instructed to get a trusted sponsor from the advise of others or from whomever within the AA meetings seems to make sense, and has a proven track record, and will hold her responsible for her sobriety, and both expects and insists on her total abstinence from alcohol.   Upon discharge, patient adamantly denies suicidal, homicidal ideations, auditory, visual hallucinations, delusional thoughts and or withdrawal symptoms. Patient left [facility] with all personal belongings in no apparent distress. He received 2 weeks worth supply samples of his [facility] discharge medications provided by [facility] pharmacy. Transportation per city bus. Bus fare provided [facility].   Consults:  psychiatry  Significant Diagnostic Studies:  labs: CBC with diff, CMP, UDs, Toxicology tests, U/A  Discharge Vitals:   Blood pressure 106/73, pulse 93, temperature 97.9 F (36.6 C), temperature source Oral, resp. rate 16, height 6\' 2"  (1.88 m), weight 68.947 kg (152 lb), SpO2 98.00%. Body mass index is 19.51 kg/(m^2). Lab Results:   No results found for this or any previous visit (from the past 72 hour(s)).  Physical Findings: AIMS: Facial and Oral Movements Muscles of Facial Expression: None, normal Lips and Perioral Area: None, normal Jaw: None, normal Tongue: None, normal,Extremity Movements Upper (arms, wrists, hands, fingers): None, normal Lower (legs, knees, ankles, toes): None, normal, Trunk  Movements Neck, shoulders, hips: None, normal, Overall Severity Severity of abnormal movements (highest score from questions above): None, normal Incapacitation due to abnormal movements: None, normal Patient's awareness of abnormal movements (rate only patient's report): No Awareness, Dental Status Current problems with teeth and/or dentures?: No Does patient usually wear dentures?: No  CIWA:  CIWA-Ar Total: 1 COWS:     Psychiatric Specialty Exam: See Psychiatric Specialty Exam and Suicide Risk Assessment completed by Attending Physician prior to discharge.  Discharge destination:  Home, later to [facility] residention  Is patient on multiple antipsychotic therapies at discharge:  No   Has Patient had three or more failed trials of antipsychotic monotherapy by history:  No  Recommended Plan for Multiple Antipsychotic Therapies: NA     Medication List    Notice   You have not been prescribed any medications.         Follow-up Information   Follow up with [facility]. (Walk in between 8am-9am Monday through Friday for hospital followup.)    Contact information:   [address], [place] [address] phone: [phone] fax: [phone]      Follow up with [facility]. (Left message for Victorino Dike to get admission date.)    Contact information:   [address] [place], [place] [address] phone: [phone] fax: [phone]     Follow-up recommendations: Activity:  As tolerated Diet: As recommended by your primary care doctor. Keep all scheduled follow-up appointments as recommended.   Continue to work your relapse prevention plan Comments:  Take all your medications as prescribed by your mental healthcare provider. Report any adverse effects and or reactions from your medicines to your outpatient provider promptly. Patient is instructed and cautioned to not engage in alcohol and or illegal drug use while on prescription medicines. In the event of worsening  symptoms, patient is instructed to call the crisis hotline, 911 and or go to the nearest ED for appropriate evaluation and treatment of symptoms. Follow-up with your primary care provider for your other medical issues, concerns and or health care needs.   Total Discharge Time:  Greater than 30 minutes.  Signed: Sanjuana Kava, PMHNP-BC 06/07/2013, 10:31 AM Agree with assessment and plan Reymundo Poll. Dub Mikes, M.D.

## 2013-06-07 NOTE — Progress Notes (Signed)
Patient ID: Chad Sanchez, male   DOB: [birth year], 42 y.o.   MRN: [id number] He has been discharged said that he was going to a friends where they did not use drugs.  He voiced understanding of discharge teaching about his medications, and follow up plan. He denies thoughts of SI. All belonging taken home with him.

## 2013-06-07 NOTE — [facility] Group Notes (Signed)
[facility] LCSW Aftercare Discharge Planning Group Note   06/07/2013 9:35 AM  Participation Quality:  Appropriate   Mood/Affect:  Appropriate  Depression Rating:  0  Anxiety Rating:  0  Thoughts of Suicide:  No Will you contract for safety?   NA  Current AVH:  No  Plan for Discharge/Comments:  Pt would like to d/c today and stated that he feels "great." He has two court dates approaching and would like [facility] date set for Aug 15th or later in order to attend court on these days. Pt is not on psych meds and reported that he does not need to go to [place]. He plans to live with a friend until admission date at [facility].   Transportation Means: bus pass  Supports: none identified  Smart, [employer]

## 2013-06-07 NOTE — BHH Counselor (Signed)
Adult Comprehensive Assessment  Patient ID: Chad Sanchez, male   DOB: [birth year], 42 y.o.   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 No contact with family members Financial / Lack of resources (include bankruptcy): no income reported Housing / Lack of housing: homeless Physical health (include injuries & life threatening diseases): none identified  Social relationships: none identified Substance abuse: 12 pack daily of beer. 2 uses of crack cocaine in the past month. Bereavement / Loss: none identified.   Living/Environment/Situation:  Living Arrangements: Other (Comment) ([facility] until present) Living conditions (as described by patient or guardian): safe, relatively clean How long has patient lived in current situation?: one month What is atmosphere in current home: Temporary  Family History:  Marital status: Single Does patient have children?: Yes How many children?: 3 How is patient's relationship with their children?: No contact with three children. "they are all grown."  Childhood History:  By whom was/is the patient raised?: Both parents Additional childhood history information: "I was adopted when I was 63 weeks old" Description of patient's relationship with caregiver when they were a child: "I had a normal relationship with my parents." "I was close with my biological parents as well." Patient's description of current relationship with people who raised him/her: Mother deceased. strained relationship with father. I moved out of [place], my hometown a long time ago.  Does patient have siblings?: Yes Number of Siblings: 14 Description of patient's current relationship with siblings: No contact with siblings.  Did patient suffer any verbal/emotional/physical/sexual abuse as a child?:  No Did patient suffer from severe childhood neglect?: No Has patient ever been sexually abused/assaulted/raped as an adolescent or adult?: No Was the patient ever a victim of a crime or a disaster?: No Witnessed domestic violence?: No Has patient been effected by domestic violence as an adult?: No  Education:  Highest grade of school patient has completed: High school and some college. Currently a student?: No Learning disability?: No  Employment/Work Situation:   Employment situation: Unemployed Patient's job has been impacted by current illness: Yes Describe how patient's job has been impacted: "I feel sick and hungover, to where I dont' want to go into work." What is the longest time patient has a held a job?: 2 years Where was the patient employed at that time?: [employer]--during program.  Has patient ever been in the [employer]?: No Has patient ever served in Buyer, retail?: No  Financial Resources:   Financial resources: No income Does patient have a Lawyer or guardian?: No  Alcohol/Substance Abuse:   What has been your use of drugs/alcohol within the last 12 months?: 12 pack of beer or more daily for the past month. Some crack cocaine-used twice in past month.  If attempted suicide, did drugs/alcohol play a role in this?: No Alcohol/Substance Abuse Treatment Hx: Past Tx, Inpatient If yes, describe treatment: 2 years at [facility]; 9 months at [facility];  Has alcohol/substance abuse ever caused legal problems?: No  Social Support System:   Forensic psychologist System: None Describe Community Support System: "I don't have anyone." Type of faith/religion: "I believe in God." How does patient's faith help to cope with current illness?: "it doesn't help me cope with my alcoholism."  Leisure/Recreation:   Leisure and Hobbies: none identified  Strengths/Needs:   What things does the patient do well?: "I don't know." In what areas  does patient struggle / problems for  patient: mood swings, irritability, isolation, "I have a hard time being around people."   Discharge Plan:   Does patient have access to transportation?: No (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  Currently receiving community mental health services: No If no, would patient like referral for services when discharged?: Yes (What county?) Medical sales [place]) Does patient have financial barriers related to discharge medications?: Yes Patient description of barriers related to discharge medications: No funds currently.   Summary/Recommendations:    Pt is 42 year old male who presents to the hospital for ETOH detox and depression. Pt had been living at the [facility] but is unable to return. Recommendations for pt include: therapeutic milieu, encourage group attendance and participation, librium taper for withdrawals, medication management for mood stabilization, and development of comprehensive mental wellness/sobireity plan. Pt hoping to be admitted to [facility] and will followup at [facility] for med management. He plans to stay with a friend until [facility] admit date.   Smart, Research scientist (physical sciences). 06/07/2013

## 2013-06-10 NOTE — Progress Notes (Signed)
Patient Discharge Instructions:  After Visit Summary (AVS):   Faxed to:  06/10/13 Discharge Summary Note:   Faxed to:  06/10/13 Psychiatric Admission Assessment Note:   Faxed to:  06/10/13 Suicide Risk Assessment - Discharge Assessment:   Faxed to:  06/10/13 Faxed/Sent to the Next Level Care provider:  06/10/13 Faxed to [facility] @ [phone] Faxed to [facility] @ [phone]  Jerelene Redden, 06/10/2013, 2:11 PM

## 2013-08-14 ENCOUNTER — Emergency Department (HOSPITAL_COMMUNITY)
Admission: EM | Admit: 2013-08-14 | Discharge: 2013-08-14 | Disposition: A | Discharge status: Home / Self Care | Payer: Self-pay | Attending: Emergency Medicine | Admitting: Emergency Medicine

## 2013-08-14 ENCOUNTER — Encounter (HOSPITAL_COMMUNITY): Payer: Self-pay | Admitting: Emergency Medicine

## 2013-08-14 DIAGNOSIS — F1020 Alcohol dependence, uncomplicated: Secondary | ICD-10-CM | POA: Insufficient documentation

## 2013-08-14 DIAGNOSIS — R45851 Suicidal ideations: Secondary | ICD-10-CM | POA: Insufficient documentation

## 2013-08-14 DIAGNOSIS — R259 Unspecified abnormal involuntary movements: Secondary | ICD-10-CM | POA: Insufficient documentation

## 2013-08-14 DIAGNOSIS — F1010 Alcohol abuse, uncomplicated: Secondary | ICD-10-CM

## 2013-08-14 DIAGNOSIS — F17200 Nicotine dependence, unspecified, uncomplicated: Secondary | ICD-10-CM | POA: Insufficient documentation

## 2013-08-14 LAB — CBC
MCH: 27.3 pg (ref 26.0–34.0)
MCHC: 34.8 g/dL (ref 30.0–36.0)
MCV: 78.4 fL (ref 78.0–100.0)
Platelets: 147 10*3/uL — ABNORMAL LOW (ref 150–400)

## 2013-08-14 LAB — COMPREHENSIVE METABOLIC PANEL
ALT: 20 U/L (ref 0–53)
AST: 20 U/L (ref 0–37)
CO2: 26 mEq/L (ref 19–32)
Calcium: 9.2 mg/dL (ref 8.4–10.5)
Creatinine, Ser: 0.9 mg/dL (ref 0.50–1.35)
GFR calc Af Amer: >90 mL/min (ref >90)
GFR calc non Af Amer: >90 mL/min (ref >90)
Glucose, Bld: 77 mg/dL (ref 70–99)
Sodium: 139 mEq/L (ref 135–145)
Total Protein: 7.2 g/dL (ref 6.0–8.3)

## 2013-08-14 LAB — SALICYLATE LEVEL: Salicylate Lvl: <2 mg/dL — ABNORMAL LOW (ref 2.8–20.0)

## 2013-08-14 LAB — RAPID URINE DRUG SCREEN, HOSP PERFORMED
Cocaine: NOT DETECTED
Opiates: NOT DETECTED
Tetrahydrocannabinol: NOT DETECTED

## 2013-08-14 MED ORDER — LORAZEPAM 1 MG PO TABS
1.0000 mg | ORAL_TABLET | Freq: Four times a day (QID) | ORAL | Status: DC | PRN
  Administered 2013-08-14: 1 mg via ORAL
  Filled 2013-08-14: qty 1

## 2013-08-14 MED ORDER — ADULT MULTIVITAMIN W/MINERALS CH
1.0000 | ORAL_TABLET | Freq: Every day | ORAL | Status: DC
  Administered 2013-08-14: 1 via ORAL
  Filled 2013-08-14: qty 1

## 2013-08-14 MED ORDER — LORAZEPAM 2 MG/ML IJ SOLN: 1.0000 mg | Freq: Four times a day (QID) | INTRAMUSCULAR | Status: DC | PRN

## 2013-08-14 MED ORDER — FOLIC ACID 1 MG PO TABS
1.0000 mg | ORAL_TABLET | Freq: Every day | ORAL | Status: DC
  Administered 2013-08-14: 1 mg via ORAL
  Filled 2013-08-14: qty 1

## 2013-08-14 MED ORDER — LORAZEPAM 1 MG PO TABS: 0.0000 mg | ORAL_TABLET | Freq: Two times a day (BID) | ORAL | Status: DC

## 2013-08-14 MED ORDER — LORAZEPAM 1 MG PO TABS
0.0000 mg | ORAL_TABLET | Freq: Four times a day (QID) | ORAL | Status: DC
  Administered 2013-08-14: 1 mg via ORAL
  Filled 2013-08-14: qty 1

## 2013-08-14 MED ORDER — VITAMIN B-1 100 MG PO TABS
100.0000 mg | ORAL_TABLET | Freq: Every day | ORAL | Status: DC
  Administered 2013-08-14: 100 mg via ORAL
  Filled 2013-08-14: qty 1

## 2013-08-14 MED ORDER — THIAMINE HCL 100 MG/ML IJ SOLN: 100.0000 mg | Freq: Every day | INTRAMUSCULAR | Status: DC

## 2013-08-14 MED ORDER — LORAZEPAM 2 MG PO TABS: 2.0000 mg | ORAL_TABLET | Freq: Four times a day (QID) | ORAL | Status: DC | PRN

## 2013-08-14 NOTE — ED Notes (Addendum)
Pt. Denies SI/HI. Belongings given to patient from bin and security locker.

## 2013-08-14 NOTE — ED Notes (Signed)
Pt. Given bus pass to take bus home. Verbalized understanding of withdrawal symptoms and prescribed medications. States he will follow up on Monday with caring services.

## 2013-08-14 NOTE — ED Notes (Signed)
2 days ago, I drank a little more than a 12 pack, and 5th of liquor.

## 2013-08-14 NOTE — BH Assessment (Signed)
Tele Assessment Note   Chad Sanchez is an 42 y.o. male, single, African-American who presents to [facility] ED requesting detox from alcohol. Pt reports he has been drinking alcohol regularly for 20 years. He states he has arranged to start residential treatment at [facility] in [place] on 08/16/2013 and in preparation he started detoxing himself today and began having tremors so he came to the ED. He states his last drink was yesterday and his current BAL<11. He reports a history of blackouts but no seizures. He states he has been drinking a fifth of liquor plus 12 beers daily for months. He denies any other substance abuse. He reports feeling depressed about his inability to maintain sobriety but otherwise his mood is good. He reports losing 20 lbs in the past 3 months because he does not eat well when he drinks. He denies current suicidal ideation or history of suicidal gestures. He denies homicidal ideation or history of violence. He denies any psychotic symptoms. He denies any current legal charges.  Pt moved from [place] to [place] in July 2014 and is currently staying with a friend. He has three adult children who do not provide support. He reports his mother died of alcohol related complications. He denies any history of trauma or abuse.  Pt is somewhat disheveled, alert, oriented x4 with normal speech and slightly tremulous motor behavior. Thought process is coherent and goal directed. Memory and concentration are WNL. Mood is euthymic and affect is appropriate to situation. Pt was calm and cooperative throughout assessment.  Axis I: 303.90 Alcohol Dependence Axis II: Deferred Axis III:  Past Medical History  Diagnosis Date  . Alcohol abuse   . Depression    Axis IV: economic problems, occupational problems and problems with primary support group Axis V: GAF=45  Past Medical History:  Past Medical History  Diagnosis Date  . Alcohol abuse   . Depression     History  reviewed. No pertinent past surgical history.  Family History: No family history on file.  Social History:  reports that he has been smoking.  He does not have any smokeless tobacco history on file. He reports that he drinks about 7.2 ounces of alcohol per week. He reports that he does not use illicit drugs.  Additional Social History:  Alcohol / Drug Use Pain Medications: Denies abuse Prescriptions: Denies abuse Over the Counter: Denies abuse History of alcohol / drug use?: Yes Longest period of sobriety (when/how long): 2009-2012 Negative Consequences of Use: Financial;Personal relationships;Work / Programmer, multimedia Withdrawal Symptoms: Tremors;Nausea / Vomiting Substance #1 Name of Substance 1: Alcohol 1 - Age of First Use: 13 1 - Amount (size/oz): One fifth of liquor plus 12 beers 1 - Frequency: daily  1 - Duration: 20 years 1 - Last Use / Amount: 08/13/13, one fifth of liquor plus 12 beers  CIWA: CIWA-Ar BP: 115/74 mmHg Pulse Rate: 92 Nausea and Vomiting: no nausea and no vomiting Tactile Disturbances: none Tremor: two Auditory Disturbances: not present Paroxysmal Sweats: no sweat visible Visual Disturbances: not present Anxiety: no anxiety, at ease Headache, Fullness in Head: none present Agitation: normal activity Orientation and Clouding of Sensorium: oriented and can do serial additions CIWA-Ar Total: 2 COWS:    Allergies: No Known Allergies  Home Medications:  (Not in a hospital admission)  OB/GYN Status:  No LMP for male patient.  General Assessment Data Location of Assessment: [facility] ED Is this a Tele or Face-to-Face Assessment?: Tele Assessment Is this an Initial Assessment or a  Re-assessment for this encounter?: Initial Assessment Living Arrangements: Other (Comment) (Staying with friend) Can pt return to current living arrangement?: Yes Admission Status: Voluntary Is patient capable of signing voluntary admission?: Yes Transfer from: Home Referral Source:  Self/Family/Friend     [facility] Crisis Care Plan Living Arrangements: Other (Comment) (Staying with friend) Name of Psychiatrist: None Name of Therapist: None  Education Status Is patient currently in school?: No Current Grade: NA Highest grade of school patient has completed: NA Name of school: NA Contact person: NA  Risk to self Suicidal Ideation: No Suicidal Intent: No Is patient at risk for suicide?: No Suicidal Plan?: No Access to Means: No What has been your use of drugs/alcohol within the last 12 months?: Pt drinking alcohol daily Previous Attempts/Gestures: No How many times?: 0 Other Self Harm Risks: None Triggers for Past Attempts: None known Intentional Self Injurious Behavior: None Family Suicide History: No Recent stressful life event(s): Financial Problems Persecutory voices/beliefs?: No Depression: Yes Depression Symptoms: Guilt Substance abuse history and/or treatment for substance abuse?: Yes Suicide prevention information given to non-admitted patients: Not applicable  Risk to Others Homicidal Ideation: No Thoughts of Harm to Others: No Current Homicidal Intent: No Current Homicidal Plan: No Access to Homicidal Means: No Identified Victim: None History of harm to others?: No Assessment of Violence: None Noted Violent Behavior Description: None Does patient have access to weapons?: No Criminal Charges Pending?: No Does patient have a court date: No  Psychosis Hallucinations: None noted Delusions: None noted  Mental Status Report Appear/Hygiene: Disheveled Eye Contact: Good Motor Activity: Tremors Speech: Logical/coherent Level of Consciousness: Alert Mood: Other (Comment) (Euthymic) Affect: Appropriate to circumstance Anxiety Level: None Thought Processes: Coherent;Relevant Judgement: Unimpaired Orientation: Person;Place;Time;Situation Obsessive Compulsive Thoughts/Behaviors: None  Cognitive Functioning Concentration: Normal Memory:  Recent Intact;Remote Intact IQ: Average Insight: Fair Impulse Control: Good Appetite: Fair Weight Loss: 20 (in 3 months) Weight Gain: 0 Sleep: No Change Total Hours of Sleep: 6 Vegetative Symptoms: None  ADLScreening [facility] Assessment Services) Patient's cognitive ability adequate to safely complete daily activities?: Yes Patient able to express need for assistance with ADLs?: Yes Independently performs ADLs?: Yes (appropriate for developmental age)  Prior Inpatient Therapy Prior Inpatient Therapy: Yes Prior Therapy Dates: 06/2013, 2009-2012 Prior Therapy Facilty/Provider(s): [facility], [facility] Reason for Treatment: Alcohol dependence  Prior Outpatient Therapy Prior Outpatient Therapy: Yes Prior Therapy Dates: ongoing Prior Therapy Facilty/Provider(s): [facility] Reason for Treatment: Alcohol dependence  ADL Screening (condition at time of admission) Patient's cognitive ability adequate to safely complete daily activities?: Yes Is the patient deaf or have difficulty hearing?: No Does the patient have difficulty seeing, even when wearing glasses/contacts?: No Does the patient have difficulty concentrating, remembering, or making decisions?: No Patient able to express need for assistance with ADLs?: Yes Does the patient have difficulty dressing or bathing?: No Independently performs ADLs?: Yes (appropriate for developmental age) Does the patient have difficulty walking or climbing stairs?: No Weakness of Legs: None Weakness of Arms/Hands: None  Home Assistive Devices/Equipment Home Assistive Devices/Equipment: None    Abuse/Neglect Assessment (Assessment to be complete while patient is alone) Physical Abuse: Denies Verbal Abuse: Denies Sexual Abuse: Denies Exploitation of patient/patient's resources: Denies Self-Neglect: Denies Values / Beliefs Cultural Requests During Hospitalization: None Spiritual Requests During Hospitalization: None   Advance Directives  (For Healthcare) Advance Directive: Patient does not have advance directive;Patient would not like information Pre-existing out of facility DNR order (yellow form or pink MOST form): No Nutrition Screen- [facility] Adult/[facility]/AP Patient's home diet: Regular  Additional Information 1:1 In Past 12 Months?: No CIRT Risk: No Elopement Risk: No Does patient have medical clearance?: Yes     Disposition:  Disposition Initial Assessment Completed for this Encounter: Yes Disposition of Patient: Other dispositions Other disposition(s): Other (Comment) (Pt will follow up with [facility] in [facility])  Laverle Hobby, [facility] at [facility] confirms that adult unit is at capacity. There are also no beds available at [facility] or [facility]. Consulted with Gilda Crease, MD who said Pt does not require inpatient detox at this time and plans to discharge Pt with medication to assist with withdrawal symptoms. Pt states he is scheduled to start residential treatment through [facility] in [place] on 08/16/2013. He denies any current safety issues.  Pamalee Leyden, [facility], [facility] Triage Specialist    Davonna Belling Cheri Kearns 08/14/2013 9:33 PM

## 2013-08-14 NOTE — ED Notes (Signed)
Pt. Stated, I need alcohol detox.  I've been drinking pretty heavy 2 days ago and now [employer].  I've been spitting up some blood. Im suppose to go through a program this week.

## 2013-08-14 NOTE — ED Notes (Signed)
Security at bedside wanding patient  

## 2013-08-14 NOTE — ED Notes (Signed)
Ativan 1mg  given to patient prior to discharge due to tremors for alcohol withdrawal symptoms

## 2013-08-14 NOTE — ED Notes (Signed)
Pt knows that urine is needed 

## 2013-08-14 NOTE — ED Provider Notes (Addendum)
CSN: [id number]     Arrival date & time 08/14/13  1434 History   First MD Initiated Contact with Patient 08/14/13 1517     Chief Complaint  Patient presents with  . Drug / Alcohol Assessment   (Consider location/radiation/quality/duration/timing/severity/associated sxs/prior Treatment) HPI Comments: Patient presents to the ER for evaluation for detox. Patient reports a 20 year history of alcoholism. He has been drinking heavily recently. Patient reports that now whenever he tries to not drink, he gets shakes and tremors. Patient reports that he is scheduled to enter a program this coming week, but he does not want to have to keep drinking until he gets better. He is afraid of withdrawal symptoms. He has never had a seizure with withdrawal but has had significant withdrawal symptoms in the past. Patient is not homicidal or suicidal.  Patient is a 42 y.o. male presenting with drug/alcohol assessment.  Drug / Alcohol Assessment Associated symptoms: suicidal ideation     Past Medical History  Diagnosis Date  . Alcohol abuse   . Depression    History reviewed. No pertinent past surgical history. No family history on file. History  Substance Use Topics  . Smoking status: Current Every Day Smoker  . Smokeless tobacco: Not on file  . Alcohol Use: 7.2 oz/week    12 Cans of beer per week     Comment: a case a day; pt told this writer 12 pk daily     Review of Systems  Cardiovascular: Negative.   Gastrointestinal: Negative.   Psychiatric/Behavioral: Positive for suicidal ideas.  All other systems reviewed and are negative.    Allergies  Review of patient's allergies indicates no known allergies.  Home Medications  No current outpatient prescriptions on file. BP 126/84  Pulse 84  Temp(Src) 98.6 F (37 C) (Oral)  Resp 18  Wt 151 lb 9.6 oz (68.765 kg)  BMI 19.46 kg/m2  SpO2 96% Physical Exam  Constitutional: He is oriented to person, place, and time. He appears well-developed  and well-nourished. No distress.  HENT:  Head: Normocephalic and atraumatic.  Right Ear: Hearing normal.  Left Ear: Hearing normal.  Nose: Nose normal.  Mouth/Throat: Oropharynx is clear and moist and mucous membranes are normal.  Eyes: Conjunctivae and EOM are normal. Pupils are equal, round, and reactive to light.  Neck: Normal range of motion. Neck supple.  Cardiovascular: Regular rhythm, S1 normal and S2 normal.  Exam reveals no gallop and no friction rub.   No murmur heard. Pulmonary/Chest: Effort normal and breath sounds normal. No respiratory distress. He exhibits no tenderness.  Abdominal: Soft. Normal appearance and bowel sounds are normal. There is no hepatosplenomegaly. There is no tenderness. There is no rebound, no guarding, no tenderness at McBurney's point and negative Murphy's sign. No hernia.  Musculoskeletal: Normal range of motion.  Neurological: He is alert and oriented to person, place, and time. He has normal strength. No cranial nerve deficit or sensory deficit. Coordination normal. GCS eye subscore is 4. GCS verbal subscore is 5. GCS motor subscore is 6.  Skin: Skin is warm, dry and intact. No rash noted. No cyanosis.  Psychiatric: He has a normal mood and affect. His speech is normal and behavior is normal. Thought content normal.    ED Course  Procedures (including critical care time) Labs Review Labs Reviewed  CBC - Abnormal; Notable for the following:    Hemoglobin 12.4 (*)    HCT 35.6 (*)    Platelets 147 (*)    All  other components within normal limits  ACETAMINOPHEN LEVEL  COMPREHENSIVE METABOLIC PANEL  ETHANOL  SALICYLATE LEVEL  URINE RAPID DRUG SCREEN (HOSP PERFORMED)   Imaging Review No results found.  EKG Interpretation   None       MDM  Diagnosis: Alcohol abuse  Patient presents to the ER for evaluation of alcohol abuse. Patient reports that he has a 20 year history of alcoholism. He stopped drinking yesterday. He has been getting  tremors and shakes. He feels like his withdrawal is getting worse. He is scheduled to start a program during the week, but does not think he can make it to the program. He feels like he'll need to drink in order to diminish his withdrawal symptoms he does not want to drink any further. He is now asking for an evaluation for immediate detox. Workup was unremarkable. Patient medically clear for detox evaluation.  Addendum: Behavioral Health has evaluated the patient. Patient cannot be placed at behavioral health because there are no beds in the outlook for the weekend does not suggest that there would be one. Since the patient has followup scheduled in 2 days, patient will be discharged with a supply of Ativan for his withdrawal symptoms and is to followup Monday with his program.  Gilda Crease, MD 08/14/13 1651  Gilda Crease, MD 08/14/13 2120

## 2013-08-14 NOTE — ED Notes (Signed)
Teleact set up at bedside

## 2013-08-16 ENCOUNTER — Emergency Department (HOSPITAL_COMMUNITY)
Admission: EM | Admit: 2013-08-16 | Discharge: 2013-08-17 | Disposition: A | Discharge status: Other Acute Inpatient Hospital | Payer: PRIVATE HEALTH INSURANCE | Attending: Dermatology | Admitting: Dermatology

## 2013-08-16 ENCOUNTER — Encounter (HOSPITAL_COMMUNITY): Payer: Self-pay | Admitting: Emergency Medicine

## 2013-08-16 DIAGNOSIS — Z79899 Other long term (current) drug therapy: Secondary | ICD-10-CM | POA: Insufficient documentation

## 2013-08-16 DIAGNOSIS — F17200 Nicotine dependence, unspecified, uncomplicated: Secondary | ICD-10-CM | POA: Insufficient documentation

## 2013-08-16 DIAGNOSIS — F329 Major depressive disorder, single episode, unspecified: Secondary | ICD-10-CM

## 2013-08-16 DIAGNOSIS — F1010 Alcohol abuse, uncomplicated: Secondary | ICD-10-CM

## 2013-08-16 DIAGNOSIS — F1020 Alcohol dependence, uncomplicated: Secondary | ICD-10-CM | POA: Insufficient documentation

## 2013-08-16 DIAGNOSIS — R45851 Suicidal ideations: Secondary | ICD-10-CM | POA: Insufficient documentation

## 2013-08-16 DIAGNOSIS — F3289 Other specified depressive episodes: Secondary | ICD-10-CM | POA: Insufficient documentation

## 2013-08-16 DIAGNOSIS — F1994 Other psychoactive substance use, unspecified with psychoactive substance-induced mood disorder: Secondary | ICD-10-CM | POA: Insufficient documentation

## 2013-08-16 LAB — COMPREHENSIVE METABOLIC PANEL
ALT: 19 U/L (ref 0–53)
Alkaline Phosphatase: 56 U/L (ref 39–117)
CO2: 25 mEq/L (ref 19–32)
Chloride: 104 mEq/L (ref 96–112)
GFR calc Af Amer: >90 mL/min (ref >90)
Glucose, Bld: 81 mg/dL (ref 70–99)
Potassium: 3.6 mEq/L (ref 3.5–5.1)
Sodium: 138 mEq/L (ref 135–145)
Total Bilirubin: 0.4 mg/dL (ref 0.3–1.2)
Total Protein: 7.3 g/dL (ref 6.0–8.3)

## 2013-08-16 LAB — RAPID URINE DRUG SCREEN, HOSP PERFORMED
Amphetamines: NOT DETECTED
Barbiturates: NOT DETECTED
Cocaine: NOT DETECTED
Tetrahydrocannabinol: NOT DETECTED

## 2013-08-16 LAB — CBC
Hemoglobin: 12.9 g/dL — ABNORMAL LOW (ref 13.0–17.0)
MCHC: 33.9 g/dL (ref 30.0–36.0)
RBC: 4.84 MIL/uL (ref 4.22–5.81)

## 2013-08-16 LAB — ETHANOL: Alcohol, Ethyl (B): 122 mg/dL — ABNORMAL HIGH (ref 0–11)

## 2013-08-16 LAB — ACETAMINOPHEN LEVEL: Acetaminophen (Tylenol), Serum: <15 ug/mL (ref 10–30)

## 2013-08-16 MED ORDER — LORAZEPAM 1 MG PO TABS: 0.0000 mg | ORAL_TABLET | Freq: Two times a day (BID) | ORAL | Status: DC

## 2013-08-16 MED ORDER — THIAMINE HCL 100 MG/ML IJ SOLN: 100.0000 mg | Freq: Every day | INTRAMUSCULAR | Status: DC

## 2013-08-16 MED ORDER — VITAMIN B-1 100 MG PO TABS
100.0000 mg | ORAL_TABLET | Freq: Every day | ORAL | Status: DC
  Administered 2013-08-16 – 2013-08-17 (×2): 100 mg via ORAL
  Filled 2013-08-16 (×2): qty 1

## 2013-08-16 MED ORDER — LORAZEPAM 1 MG PO TABS: 0.0000 mg | ORAL_TABLET | Freq: Four times a day (QID) | ORAL | Status: DC

## 2013-08-16 NOTE — ED Provider Notes (Addendum)
CSN: [id number]     Arrival date & time 08/16/13  1545 History   First MD Initiated Contact with Patient 08/16/13 1615     Chief complaint: depression, etoh abuse  (Consider location/radiation/quality/duration/timing/severity/associated sxs/prior Treatment) The history is provided by the patient.  pt with hx depression and etoh abuse, presents stating in past few months drinking heavily, also feeling more depressed in past 1-2 weeks. Denies specific inciting event, noting 'several things'.  States symptoms constant, but wax and wan in severity. Cant quantify etoh abuse, but states drinking daily. When stops feels shaky, but denies hx seizures, or dts. Denies other substance abuse. Notes transient thoughts of walking into traffic or train, but denies any attempt at self harm. States recent physical health at baseline.    Past Medical History  Diagnosis Date  . Alcohol abuse   . Depression    No past surgical history on file. No family history on file. History  Substance Use Topics  . Smoking status: Current Every Day Smoker  . Smokeless tobacco: Not on file  . Alcohol Use: 7.2 oz/week    12 Cans of beer per week     Comment: a case a day; pt told this writer 12 pk daily     Review of Systems  Constitutional: Negative for fever.  Eyes: Negative for visual disturbance.  Respiratory: Negative for shortness of breath.   Cardiovascular: Negative for chest pain.  Gastrointestinal: Negative for vomiting, abdominal pain and diarrhea.  Genitourinary: Negative for flank pain.  Musculoskeletal: Negative for back pain and neck pain.  Skin: Negative for rash.  Neurological: Negative for headaches.  Hematological: Does not bruise/bleed easily.  Psychiatric/Behavioral: Positive for dysphoric mood.    Allergies  Review of patient's allergies indicates no known allergies.  Home Medications   Current Outpatient Rx  Name  Route  Sig  Dispense  Refill  . LORazepam (ATIVAN) 2 MG tablet  Oral   Take 1 tablet (2 mg total) by mouth every 6 (six) hours as needed (alcohol withdrawal symptoms).   6 tablet   0   . traZODone (DESYREL) 50 MG tablet   Oral   Take 50 mg by mouth at bedtime.          There were no vitals taken for this visit. Physical Exam  Nursing note and vitals reviewed. Constitutional: He is oriented to person, place, and time. He appears well-developed and well-nourished. No distress.  HENT:  Head: Atraumatic.  Eyes: Pupils are equal, round, and reactive to light. No scleral icterus.  Neck: Neck supple. No tracheal deviation present.  Cardiovascular: Normal rate, regular rhythm, normal heart sounds and intact distal pulses.   Pulmonary/Chest: Effort normal and breath sounds normal. No accessory muscle usage. No respiratory distress.  Abdominal: Soft. He exhibits no distension. There is no tenderness.  Musculoskeletal: Normal range of motion.  Neurological: He is alert and oriented to person, place, and time.  Skin: Skin is warm and dry.  Psychiatric:  Depressed mood.      ED Course  Procedures (including critical care time)  Results for orders placed during the hospital encounter of 08/16/13  ACETAMINOPHEN LEVEL      Result Value Range   Acetaminophen (Tylenol), Serum <15.0  10 - 30 ug/mL  CBC      Result Value Range   WBC 5.9  4.0 - 10.5 K/uL   RBC 4.84  4.22 - 5.81 MIL/uL   Hemoglobin 12.9 (*) 13.0 - 17.0 g/dL   HCT 84.1 (*)  39.0 - 52.0 %   MCV 78.7  78.0 - 100.0 fL   MCH 26.7  26.0 - 34.0 pg   MCHC 33.9  30.0 - 36.0 g/dL   RDW 96.0  45.4 - 09.8 %   Platelets 168  150 - 400 K/uL  COMPREHENSIVE METABOLIC PANEL      Result Value Range   Sodium 138  135 - 145 mEq/L   Potassium 3.6  3.5 - 5.1 mEq/L   Chloride 104  96 - 112 mEq/L   CO2 25  19 - 32 mEq/L   Glucose, Bld 81  70 - 99 mg/dL   BUN 14  6 - 23 mg/dL   Creatinine, Ser 1.19  0.50 - 1.35 mg/dL   Calcium 9.3  8.4 - 14.7 mg/dL   Total Protein 7.3  6.0 - 8.3 g/dL   Albumin 4.0   3.5 - 5.2 g/dL   AST 22  0 - 37 U/L   ALT 19  0 - 53 U/L   Alkaline Phosphatase 56  39 - 117 U/L   Total Bilirubin 0.4  0.3 - 1.2 mg/dL   GFR calc non Af Amer >90  >90 mL/min   GFR calc Af Amer >90  >90 mL/min  ETHANOL      Result Value Range   Alcohol, Ethyl (B) 122 (*) 0 - 11 mg/dL  SALICYLATE LEVEL      Result Value Range   Salicylate Lvl <2.0 (*) 2.8 - 20.0 mg/dL  URINE RAPID DRUG SCREEN (HOSP PERFORMED)      Result Value Range   Opiates NONE DETECTED  NONE DETECTED   Cocaine NONE DETECTED  NONE DETECTED   Benzodiazepines NONE DETECTED  NONE DETECTED   Amphetamines NONE DETECTED  NONE DETECTED   Tetrahydrocannabinol NONE DETECTED  NONE DETECTED   Barbiturates NONE DETECTED  NONE DETECTED   EKG Interpretation   None       MDM  Labs.  Pt requests med for nerves. Ativan 1 mg po.  Reviewed nursing notes and prior charts for additional history.   Psych team consulted.   Recheck pt alert, content. Awaiting psych team eval and dispo.  Psych team indicates pt accepted to [facility], Dr Izora Ribas, MD 08/16/13 8295  Suzi Roots, MD 08/16/13 2152

## 2013-08-16 NOTE — ED Notes (Signed)
Teleassessment in progress. 

## 2013-08-16 NOTE — ED Notes (Addendum)
Pt is tearful during triage interview, states he is depressed and feels there is nothing to live for. He states he has had depression for most of his life and has been trying to "tough it out" and self-medicate with alcohol, but now feels that he needs to seek treatment for depression. Patient states he know he will harm himself, denies wanting to harm others. Pt states he abuses alcohol and cigarettes, denies other drug use.

## 2013-08-16 NOTE — ED Notes (Signed)
Transferred belongings to psych ed

## 2013-08-16 NOTE — BH Assessment (Signed)
Tele Assessment Note   Chad Sanchez is an 42 y.o. male who presents with suicidal ideation and seeking detox for alcohol.  He reports that he has been drinking since the age of 46, with a few exceptions of time in rehab. He reports he has recently relocated to [facility] after completing [facility] in July, but has relapsed on Alcohol.  He reports he was sober for a while, but reports that his depressed mood often contributes to his failure.  "Nothing makes me happy.  By me not being happy, I try to do something that makes me temporarily happy, which is drinking, but that just makes it worse."  He reports currently drinking around a fifth of vodka and 12-18 beers daily, but this mornign had just a couple of beers to ease his shakiness.  He states that he is currently thinking of committing suicide by walking in front of a car and states he's been drinking more to get the courage to do so. He also reports that he has also woken up in the morning near the train tracks and in other risky situations because he has been trying to get the courage to act on his plans.  He states he came for detox a few nights ago, but did not admit that he was also suicidal because he was embarassed about it, which has also contributed to him getting off medications that were prescribed to him by his therapist and psychiatrist at the behavioral hospital in [place] last year.  He states he wants to feel better, but has trouble asking for help or accessing his family supports because of his embarrassment.  He is calm and cooperative and motivated for treatment.  He has been accepted by [facility] pending a bed.    Axis I: Major Depression, Recurrent severe and Substance Abuse-Alcohol Axis II: Deferred Axis III:  Past Medical History  Diagnosis Date  . Alcohol abuse   . Depression    Axis IV: housing problems, occupational problems and problems with access to health care services Axis V: 41-50 serious symptoms  Past Medical  History:  Past Medical History  Diagnosis Date  . Alcohol abuse   . Depression     History reviewed. No pertinent past surgical history.  Family History: History reviewed. No pertinent family history.  Social History:  reports that he has been smoking.  He does not have any smokeless tobacco history on file. He reports that he drinks about 7.2 ounces of alcohol per week. He reports that he does not use illicit drugs.  Additional Social History:  Alcohol / Drug Use History of alcohol / drug use?: Yes Longest period of sobriety (when/how long): 2 years Substance #1 Name of Substance 1: Beer, liquor 1 - Age of First Use: 13 1 - Amount (size/oz): 12-18 pack of beer and a fifth of vodka 1 - Frequency: daily 1 - Duration: 25 years 1 - Last Use / Amount: 08/16/13 couple of beers to keep from shaking  CIWA: CIWA-Ar BP: 121/86 mmHg Pulse Rate: 81 Nausea and Vomiting: mild nausea with no vomiting Tactile Disturbances: none Tremor: not visible, but can be felt fingertip to fingertip Auditory Disturbances: not present Paroxysmal Sweats: no sweat visible Visual Disturbances: not present Anxiety: mildly anxious Headache, Fullness in Head: very mild Agitation: normal activity Orientation and Clouding of Sensorium: oriented and can do serial additions CIWA-Ar Total: 4 COWS:    Allergies: No Known Allergies  Home Medications:  (Not in a hospital admission)  OB/GYN  Status:  No LMP for male patient.  General Assessment Data [facility] of [facility]: [facility] Is this a Tele or Face-to-Face Assessment?: Tele Assessment Is this an Initial Assessment or a Re-assessment for this encounter?: Initial Assessment Living Arrangements: Non-relatives/Friends Can pt return to current living arrangement?: Yes Admission Status: Voluntary Is patient capable of signing voluntary admission?: Yes Transfer from: Acute Hospital Referral Source: Self/Family/Friend     [facility] Crisis Care Plan Living  Arrangements: Non-relatives/Friends Name of Psychiatrist: None Name of Therapist: None  Education Status Is patient currently in school?: No Highest grade of school patient has completed: 2 years of college  Risk to self Suicidal Ideation: Yes-Currently Present Suicidal Intent: No-Not Currently/Within Last 6 Months Is patient at risk for suicide?: Yes Suicidal Plan?: Yes-Currently Present Specify Current Suicidal Plan: walk in front of car, train Access to Means: Yes Specify Access to Suicidal Means: environmental What has been your use of drugs/alcohol within the last 12 months?: daily drinking Previous Attempts/Gestures: Yes How many times?: 2 (has woken up in riskysituations) Triggers for Past Attempts:  (alcoho use) Intentional Self Injurious Behavior: Cutting (2 years ago) Comment - Self Injurious Behavior: cutting on forearms a few years ago Family Suicide History: No Recent stressful life event(s): Turmoil (Comment) (has trouble finishing things due to depression) Persecutory voices/beliefs?: No Depression: Yes Depression Symptoms: Despondent;Feeling worthless/self pity;Guilt;Feeling angry/irritable;Tearfulness;Loss of interest in usual pleasures;Fatigue;Isolating;Insomnia Substance abuse history and/or treatment for substance abuse?: Yes Suicide prevention information given to non-admitted patients: Not applicable  Risk to Others Homicidal Ideation: No Thoughts of Harm to Others: No Current Homicidal Intent: No Current Homicidal Plan: No Access to Homicidal Means: No History of harm to others?: No Assessment of Violence: None Noted Does patient have access to weapons?: No Criminal Charges Pending?: No Does patient have a court date: No  Psychosis Hallucinations: None noted Delusions: None noted  Mental Status Report Appear/Hygiene: Other (Comment) (unremarkable) Eye Contact: Good Motor Activity: Freedom of movement Speech: Logical/coherent Level of  Consciousness: Alert Mood: Depressed Affect: Appropriate to circumstance Anxiety Level: Panic Attacks Panic attack frequency: few weekly Most recent panic attack: yesterday while walking over a bridge Thought Processes: Coherent;Relevant Judgement: Unimpaired Orientation: Person;Place;Time;Situation Obsessive Compulsive Thoughts/Behaviors: Moderate  Cognitive Functioning Concentration: Decreased Memory: Recent Impaired;Remote Intact IQ: Average Insight: Fair Impulse Control: Fair Appetite: Poor Weight Loss: 30 Weight Gain: 0 Sleep: Decreased Total Hours of Sleep: 6 Vegetative Symptoms: Staying in bed;Decreased grooming  ADLScreening [facility] Assessment Services) Patient's cognitive ability adequate to safely complete daily activities?: Yes Patient able to express need for assistance with ADLs?: Yes Independently performs ADLs?: Yes (appropriate for developmental age)  Prior Inpatient Therapy Prior Inpatient Therapy: Yes Prior Therapy Dates: 06/2013, 2009-2012 Prior Therapy Facilty/Provider(s): [facility], [facility] Reason for Treatment: Alcohol dependence  Prior Outpatient Therapy Prior Outpatient Therapy: Yes Prior Therapy Dates: until June 13 Prior Therapy Facilty/Provider(s): [place], [facility] Reason for Treatment: Depression  ADL Screening (condition at time of admission) Patient's cognitive ability adequate to safely complete daily activities?: Yes Patient able to express need for assistance with ADLs?: Yes Independently performs ADLs?: Yes (appropriate for developmental age)       Abuse/Neglect Assessment (Assessment to be complete while patient is alone) Physical Abuse: Denies Verbal Abuse: Denies Sexual Abuse: Denies Exploitation of patient/patient's resources: Denies Values / Beliefs Cultural Requests During Hospitalization: None Spiritual Requests During Hospitalization: None   Advance Directives (For Healthcare) Advance Directive: Patient  does not have advance directive;Patient would like information Nutrition Screen- [facility] [place] [facility] Adult/[facility] Patient's  home diet: Regular  Additional Information 1:1 In Past 12 Months?: No CIRT Risk: No Elopement Risk: No Does patient have medical clearance?: Yes     Disposition:  Disposition Initial Assessment Completed for this Encounter: Yes Disposition of Patient: Inpatient treatment program Type of inpatient treatment program: Adult  Steward Ros 08/16/2013 10:53 PM

## 2013-08-17 ENCOUNTER — Encounter (HOSPITAL_COMMUNITY): Payer: Self-pay | Admitting: Registered Nurse

## 2013-08-17 ENCOUNTER — Inpatient Hospital Stay (HOSPITAL_COMMUNITY)
Admission: AD | Admit: 2013-08-17 | Discharge: 2013-08-20 | DRG: 897 | Disposition: A | Discharge status: Home / Self Care | Payer: Federal, State, Local not specified - Other | Source: Intra-hospital | Attending: Psychiatry | Admitting: Psychiatry

## 2013-08-17 ENCOUNTER — Encounter (HOSPITAL_COMMUNITY): Payer: Self-pay | Admitting: Emergency Medicine

## 2013-08-17 DIAGNOSIS — F17200 Nicotine dependence, unspecified, uncomplicated: Secondary | ICD-10-CM | POA: Diagnosis present

## 2013-08-17 DIAGNOSIS — F1020 Alcohol dependence, uncomplicated: Secondary | ICD-10-CM | POA: Diagnosis present

## 2013-08-17 DIAGNOSIS — F1994 Other psychoactive substance use, unspecified with psychoactive substance-induced mood disorder: Secondary | ICD-10-CM | POA: Diagnosis present

## 2013-08-17 DIAGNOSIS — F10239 Alcohol dependence with withdrawal, unspecified: Principal | ICD-10-CM | POA: Diagnosis not present

## 2013-08-17 DIAGNOSIS — F329 Major depressive disorder, single episode, unspecified: Secondary | ICD-10-CM | POA: Diagnosis present

## 2013-08-17 DIAGNOSIS — F332 Major depressive disorder, recurrent severe without psychotic features: Secondary | ICD-10-CM

## 2013-08-17 DIAGNOSIS — Z79899 Other long term (current) drug therapy: Secondary | ICD-10-CM

## 2013-08-17 DIAGNOSIS — F10939 Alcohol use, unspecified with withdrawal, unspecified: Principal | ICD-10-CM | POA: Diagnosis not present

## 2013-08-17 DIAGNOSIS — R45851 Suicidal ideations: Secondary | ICD-10-CM

## 2013-08-17 MED ORDER — ZOLPIDEM TARTRATE 5 MG PO TABS
5.0000 mg | ORAL_TABLET | Freq: Every evening | ORAL | Status: DC | PRN
  Administered 2013-08-17: 5 mg via ORAL
  Filled 2013-08-17: qty 1

## 2013-08-17 MED ORDER — ALUM & MAG HYDROXIDE-SIMETH 200-200-20 MG/5ML PO SUSP: 30.0000 mL | ORAL | Status: DC | PRN

## 2013-08-17 MED ORDER — ACETAMINOPHEN 325 MG PO TABS: 650.0000 mg | ORAL_TABLET | Freq: Four times a day (QID) | ORAL | Status: DC | PRN

## 2013-08-17 MED ORDER — MAGNESIUM HYDROXIDE 400 MG/5ML PO SUSP: 30.0000 mL | Freq: Every day | ORAL | Status: DC | PRN

## 2013-08-17 NOTE — Progress Notes (Signed)
[facility] provided pt with a [facility] Card application, highlighting [facility].  °

## 2013-08-17 NOTE — Progress Notes (Signed)
Patient accepted to [facility] 304-2 by Dr. Ladona Ridgel to Dr. Dub Mikes  to be transported voluntarily by Juel Burrow. [name] informed RN who will arrange transport. [name] completed support paperwork faxed to [facility].   Catha Gosselin, [place] [phone]  ED [name] 08/17/2013 1417pm

## 2013-08-17 NOTE — Progress Notes (Signed)
Recreation Therapy Notes  Date: 10.14.2014 Time: 3:00pm Location: [address] Dayroom  Group Topic: Communication, Team Building, Problem Solving  Goal Area(s) Addresses:  Patient will effectively work with peer towards shared goal.  Patient will identify skill used to make activity successful.  Patient will identify how skills used during activity can be used to reach post d/c goals.   Behavioral Response: Did not attend.   Marykay Lex Chana Lindstrom, LRT/CTRS  Jearl Klinefelter 08/17/2013 4:57 PM

## 2013-08-17 NOTE — Tx Team (Signed)
Initial Interdisciplinary Treatment Plan  PATIENT STRENGTHS: (choose at least two) Average or above average intelligence Capable of independent living Communication skills General fund of knowledge Motivation for treatment/growth Supportive family/friends  PATIENT STRESSORS: Substance abuse   PROBLEM LIST: Problem List/Patient Goals Date to be addressed Date deferred Reason deferred Estimated date of resolution  Depression 08/17/2013     Substance Abuse 08/17/2013     Suicide Risk 08/17/2013                                          DISCHARGE CRITERIA:  Improved stabilization in mood, thinking, and/or behavior Need for constant or close observation no longer present Verbal commitment to aftercare and medication compliance  PRELIMINARY DISCHARGE PLAN: Outpatient therapy Return to previous living arrangement  PATIENT/FAMIILY INVOLVEMENT: This treatment plan has been presented to and reviewed with the patient, Chad Sanchez, and/or family member.  The patient and family have been given the opportunity to ask questions and make suggestions.  [name] 08/17/2013, 4:09 PM

## 2013-08-17 NOTE — Consult Note (Deleted)
[facility] Face-to-Face Psychiatry Consult   Reason for Consult:  Evaluation for inpatient treatment Referring Physician:  EDP  Chad Sanchez is an 42 y.o. male.  Assessment: AXIS I:  Alcohol Abuse and Depressive Disorder NOS AXIS II:  Deferred AXIS III:   Past Medical History  Diagnosis Date  . Alcohol abuse   . Depression    AXIS IV:  other psychosocial or environmental problems, problems related to social environment and problems with primary support group AXIS V:  11-20 some danger of hurting self or others possible OR occasionally fails to maintain minimal personal hygiene OR gross impairment in communication  Plan:  Recommend psychiatric Inpatient admission when medically cleared.  Subjective:   Chad Sanchez is a 42 y.o. male patient admitted with depression and suicidal ideation.  HPI:  Patient states that his drinking has worsened.  "I have been depress and I've been treated for it before in the past and was given medication; but I can't remember which medication it was.  I have been drinking to build up the courage to do something to my self; that way it will lessen the pain.  I [place] by the [place] tracks one day, and have been having thoughts to walk in front of a car.  I will com in for detox but I wouldn't say anything about the depression because I am ashamed.  But I can't take it any more and I feel like I am going to do something if I don't get help.  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  Patient states that he had services at the [facility] for 6-7 days out patient in [place].  HPI Elements:   Location:  [facility] ED. Quality:  Affecting patient mentally and physically. Severity:  Patient drinking to given him the courage to kill himself.  Past Psychiatric History: Past Medical History  Diagnosis Date  . Alcohol abuse   . Depression      reports that he has been smoking.  He does not have any smokeless tobacco history on file. He reports that he drinks about 7.2 ounces of alcohol per week. He reports that he does not use illicit drugs. History reviewed. No pertinent family history. Family History Substance Abuse:  (biological mother-pt adopted at [employer] bio mother drank) Family Supports: Yes, List: (yes, but my mood swings push them away) Living Arrangements: Non-relatives/Friends Can pt return to current living arrangement?: Yes Abuse/Neglect [facility]) Physical Abuse: Denies Verbal Abuse: Denies Sexual Abuse: Denies Allergies:  No Known Allergies  ACT Assessment Complete:  Yes:    Educational Status    Risk to Self: Risk to self Suicidal Ideation: Yes-Currently Present Suicidal Intent: No-Not Currently/Within Last 6 Months Is patient at risk for suicide?: Yes Suicidal Plan?: Yes-Currently Present Specify Current Suicidal Plan: walk in front of car, train Access to Means: Yes Specify Access to Suicidal Means: environmental What has been your use of drugs/alcohol within the last 12 months?: daily drinking Previous Attempts/Gestures: Yes How many times?: 2 (has woken up in riskysituations) Triggers for Past Attempts:  (alcoho use) Intentional Self Injurious Behavior: Cutting (2 years ago) Comment - Self Injurious Behavior: cutting on forearms a few years ago Family Suicide History: No Recent stressful life event(s): Turmoil (Comment) (has trouble finishing things due to depression) Persecutory voices/beliefs?: No Depression: Yes Depression Symptoms: Despondent;Feeling worthless/self pity;Guilt;Feeling angry/irritable;Tearfulness;Loss of  interest in usual pleasures;Fatigue;Isolating;Insomnia Substance abuse history and/or treatment for substance abuse?: Yes Suicide prevention information given to non-admitted patients: Not applicable  Risk to Others: Risk to Others Homicidal Ideation: No Thoughts of Harm to Others:  No Current Homicidal Intent: No Current Homicidal Plan: No Access to Homicidal Means: No History of harm to others?: No Assessment of Violence: None Noted Does patient have access to weapons?: No Criminal Charges Pending?: No Does patient have a court date: No  Abuse: Abuse/Neglect Assessment (Assessment to be complete while patient is alone) Physical Abuse: Denies Verbal Abuse: Denies Sexual Abuse: Denies Exploitation of patient/patient's resources: Denies  Prior Inpatient Therapy: Prior Inpatient Therapy Prior Inpatient Therapy: Yes Prior Therapy Dates: 06/2013, 2009-2012 Prior Therapy Facilty/Provider(s): [facility], [facility] Reason for Treatment: Alcohol dependence  Prior Outpatient Therapy: Prior Outpatient Therapy Prior Outpatient Therapy: Yes Prior Therapy Dates: until June 13 Prior Therapy Facilty/Provider(s): [place], [facility] Reason for Treatment: Depression  Additional Information: Additional Information 1:1 In Past 12 Months?: No CIRT Risk: No Elopement Risk: No Does patient have medical clearance?: Yes                  Objective: Blood pressure 115/81, pulse 81, temperature 98.6 F (37 C), temperature source Oral, resp. rate 16, SpO2 97.00%.There is no weight on file to calculate BMI. Results for orders placed during the hospital encounter of 08/16/13 (from the past 72 hour(s))  ACETAMINOPHEN LEVEL     Status: None   Collection Time    08/16/13  4:27 PM      Result Value Range   Acetaminophen (Tylenol), Serum <15.0  10 - 30 ug/mL   Comment:            THERAPEUTIC CONCENTRATIONS VARY     SIGNIFICANTLY. A RANGE OF 10-30     ug/mL MAY BE AN EFFECTIVE     CONCENTRATION FOR MANY PATIENTS.     HOWEVER, SOME ARE BEST TREATED     AT CONCENTRATIONS OUTSIDE THIS     RANGE.     ACETAMINOPHEN CONCENTRATIONS     >150 ug/mL AT 4 HOURS AFTER     INGESTION AND >50 ug/mL AT 12     HOURS AFTER INGESTION ARE     OFTEN ASSOCIATED WITH TOXIC      REACTIONS.  CBC     Status: Abnormal   Collection Time    08/16/13  4:27 PM      Result Value Range   WBC 5.9  4.0 - 10.5 K/uL   RBC 4.84  4.22 - 5.81 MIL/uL   Hemoglobin 12.9 (*) 13.0 - 17.0 g/dL   HCT 16.1 (*) 09.6 - 04.5 %   MCV 78.7  78.0 - 100.0 fL   MCH 26.7  26.0 - 34.0 pg   MCHC 33.9  30.0 - 36.0 g/dL   RDW 40.9  81.1 - 91.4 %   Platelets 168  150 - 400 K/uL  COMPREHENSIVE METABOLIC PANEL     Status: None   Collection Time    08/16/13  4:27 PM      Result Value Range   Sodium 138  135 - 145 mEq/L   Potassium 3.6  3.5 - 5.1 mEq/L   Chloride 104  96 - 112 mEq/L   CO2 25  19 - 32 mEq/L   Glucose, Bld 81  70 - 99 mg/dL   BUN 14  6 - 23 mg/dL   Creatinine, Ser 7.82  0.50 - 1.35 mg/dL  Calcium 9.3  8.4 - 10.5 mg/dL   Total Protein 7.3  6.0 - 8.3 g/dL   Albumin 4.0  3.5 - 5.2 g/dL   AST 22  0 - 37 U/L   ALT 19  0 - 53 U/L   Alkaline Phosphatase 56  39 - 117 U/L   Total Bilirubin 0.4  0.3 - 1.2 mg/dL   GFR calc non Af Amer >90  >90 mL/min   GFR calc Af Amer >90  >90 mL/min   Comment: (NOTE)     The eGFR has been calculated using the CKD EPI equation.     This calculation has not been validated in all clinical situations.     eGFR's persistently <90 mL/min signify possible Chronic Kidney     Disease.  ETHANOL     Status: Abnormal   Collection Time    08/16/13  4:27 PM      Result Value Range   Alcohol, Ethyl (B) 122 (*) 0 - 11 mg/dL   Comment:            LOWEST DETECTABLE LIMIT FOR     SERUM ALCOHOL IS 11 mg/dL     FOR MEDICAL PURPOSES ONLY  SALICYLATE LEVEL     Status: Abnormal   Collection Time    08/16/13  4:27 PM      Result Value Range   Salicylate Lvl <2.0 (*) 2.8 - 20.0 mg/dL  URINE RAPID DRUG SCREEN (HOSP PERFORMED)     Status: None   Collection Time    08/16/13  5:06 PM      Result Value Range   Opiates NONE DETECTED  NONE DETECTED   Cocaine NONE DETECTED  NONE DETECTED   Benzodiazepines NONE DETECTED  NONE DETECTED   Amphetamines NONE DETECTED   NONE DETECTED   Tetrahydrocannabinol NONE DETECTED  NONE DETECTED   Barbiturates NONE DETECTED  NONE DETECTED   Comment:            DRUG SCREEN FOR MEDICAL PURPOSES     ONLY.  IF CONFIRMATION IS NEEDED     FOR ANY PURPOSE, NOTIFY LAB     WITHIN 5 DAYS.                LOWEST DETECTABLE LIMITS     FOR URINE DRUG SCREEN     Drug Class       Cutoff (ng/mL)     Amphetamine      1000     Barbiturate      200     Benzodiazepine   200     Tricyclics       300     Opiates          300     Cocaine          300     THC              50   Current Facility-Administered Medications  Medication Dose Route Frequency Provider Last Rate Last Dose  . LORazepam (ATIVAN) tablet 0-4 mg  0-4 mg Oral Q6H Suzi Roots, MD       Followed by  . [START ON 08/18/2013] LORazepam (ATIVAN) tablet 0-4 mg  0-4 mg Oral Q12H Suzi Roots, MD      . thiamine (VITAMIN B-1) tablet 100 mg  100 mg Oral Daily Suzi Roots, MD   100 mg at 08/17/13 0947   Or  . thiamine (B-1) injection 100 mg  100  mg Intravenous Daily Suzi Roots, MD      . zolpidem [facility]) tablet 5 mg  5 mg Oral QHS PRN Suzi Roots, MD   5 mg at 08/17/13 0022   Current Outpatient Prescriptions  Medication Sig Dispense Refill  . LORazepam (ATIVAN) 2 MG tablet Take 1 tablet (2 mg total) by mouth every 6 (six) hours as needed (alcohol withdrawal symptoms).  6 tablet  0  . traZODone (DESYREL) 50 MG tablet Take 50 mg by mouth at bedtime.        Psychiatric Specialty Exam:     Blood pressure 115/81, pulse 81, temperature 98.6 F (37 C), temperature source Oral, resp. rate 16, SpO2 97.00%.There is no weight on file to calculate BMI.  General Appearance: Casual and Disheveled  Eye Contact::  Good  Speech:  Clear and Coherent and Normal Rate  Volume:  Normal  Mood:  Anxious, Depressed and Hopeless  Affect:  Depressed and Flat  Thought Process:  Circumstantial and Goal Directed  Orientation:  Full (Time, Place, and Person)  Thought Content:   Rumination  Suicidal Thoughts:  Yes.  with intent/plan  Homicidal Thoughts:  No  Memory:  Immediate;   Good Recent;   Good Remote;   Good  Judgement:  Impaired  Insight:  Present  Psychomotor Activity:  Tremor  Concentration:  Good  Recall:  Good  Akathisia:  No  Handed:  Right  AIMS (if indicated):     Assets:  Communication Skills Desire for Improvement Housing  Sleep:      Face to face interview and consult with Dr. Ladona Ridgel.  Treatment Plan Summary: Daily contact with patient to assess and evaluate symptoms and progress in treatment Medication management  Disposition:  Inpatient treatment recommended.  Start Librium protocol and monitor for safety and stabilization until inpatient bed found.  Chad Zaremba, FNP-BC 08/17/2013 1:40 PM

## 2013-08-17 NOTE — ED Notes (Signed)
Pt. complaining of trouble sleeping. Ambien given

## 2013-08-17 NOTE — Progress Notes (Signed)
Patient ID: Chad Sanchez, male   DOB: Mar 13, 1971, 42 y.o.   MRN: [id number]  Admission Note:   Patient in s 42 yo male admitted voluntarily for SI with attempt to walk into traffic. Pt states that cars kept swerving to avoid him. Pt also states that he needs ETOH detox. Pt states he has been drinking for 20 + years up to 18 beers/day. Pt states that he has been having periods of blackouts on average of three times per week and is having difficulty remembering things. 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 Pt states he would like to possibly start back on an anti depressant. Pt states he is having passive SI but does contract for safety while on unit. Q 15 minute safety checks initiated as per protocol.

## 2013-08-17 NOTE — [facility] Counselor (Signed)
Writer was informed that the patient has been accepted to bed 304-2 at [facility].  Writer informed the ER MD and the nurse working with the patient.   Writer faxed the support paperwork to [employer].  Writer informed the [facility] Office that the paperwork has been faxed.

## 2013-08-17 NOTE — Progress Notes (Signed)
Pt didn't attended AA

## 2013-08-17 NOTE — Progress Notes (Signed)
Recreation Therapy Notes  Date: 10.14.2014 Time: 2:30pm Location: 300 Hall Dayroom  Group Topic: Software engineer Activities (AAA)  Behavioral Response: Did not attend  [employer], LRT/CTRS  Kailoni Vahle L 08/17/2013 4:04 PM

## 2013-08-17 NOTE — Consult Note (Signed)
[facility] Face-to-Face Psychiatry Consult   Reason for Consult:  Suicidal thoughts and intent Referring Physician:  ER MD  [name] is an 42 y.o. male.  Assessment: AXIS I:  Major Depression, Recurrent severe AXIS II:  Deferred AXIS III:   Past Medical History  Diagnosis Date  . Alcohol abuse   . Depression    AXIS IV:  economic problems and housing problems AXIS V:  41-50 serious symptoms  Plan:  Recommend psychiatric Inpatient admission when medically cleared.  Subjective:   Chad Sanchez is a 42 y.o. male patient admitted with alcohol intoxication and suicidal intent.  HPI:  [name] has been an alcoholic for many years.  He has been depressed even more for the last year.  He says he has been drinking just enough to have the courage to kill himself but tends to black out.  He still wants to kill himself by jumping in front of a car or train.   He wants to drink enough so the pain will not be an issue he says.  Says he has gotten help in the past but has now given up. HPI Elements:   Location:  ER. Quality:  suicidal. Severity:  severe. Timing:  worsening for the past year. Duration:  years. Context:  worsening depression.  Past Psychiatric History: Past Medical History  Diagnosis Date  . Alcohol abuse   . Depression     reports that he has been smoking.  He does not have any smokeless tobacco history on file. He reports that he drinks about 7.2 ounces of alcohol per week. He reports that he does not use illicit drugs. History reviewed. No pertinent family history. Family History Substance Abuse:  (biological mother-pt adopted at [employer] bio mother drank) Family Supports: Yes, List: (yes, but my mood swings push them away) Living Arrangements: Non-relatives/Friends Can pt return to current living arrangement?: Yes Abuse/Neglect [facility]) Physical Abuse: Denies Verbal Abuse: Denies Sexual Abuse: Denies Allergies:  No Known Allergies  ACT Assessment Complete:  Yes:     Educational Status    Risk to Self: Risk to self Suicidal Ideation: Yes-Currently Present Suicidal Intent: No-Not Currently/Within Last 6 Months Is patient at risk for suicide?: Yes Suicidal Plan?: Yes-Currently Present Specify Current Suicidal Plan: walk in front of car, train Access to Means: Yes Specify Access to Suicidal Means: environmental What has been your use of drugs/alcohol within the last 12 months?: daily drinking Previous Attempts/Gestures: Yes How many times?: 2 (has woken up in riskysituations) Triggers for Past Attempts:  (alcoho use) Intentional Self Injurious Behavior: Cutting (2 years ago) Comment - Self Injurious Behavior: cutting on forearms a few years ago Family Suicide History: No Recent stressful life event(s): Turmoil (Comment) (has trouble finishing things due to depression) Persecutory voices/beliefs?: No Depression: Yes Depression Symptoms: Despondent;Feeling worthless/self pity;Guilt;Feeling angry/irritable;Tearfulness;Loss of interest in usual pleasures;Fatigue;Isolating;Insomnia Substance abuse history and/or treatment for substance abuse?: Yes Suicide prevention information given to non-admitted patients: Not applicable  Risk to Others: Risk to Others Homicidal Ideation: No Thoughts of Harm to Others: No Current Homicidal Intent: No Current Homicidal Plan: No Access to Homicidal Means: No History of harm to others?: No Assessment of Violence: None Noted Does patient have access to weapons?: No Criminal Charges Pending?: No Does patient have a court date: No  Abuse: Abuse/Neglect Assessment (Assessment to be complete while patient is alone) Physical Abuse: Denies Verbal Abuse: Denies Sexual Abuse: Denies Exploitation of patient/patient's resources: Denies  Prior Inpatient Therapy: Prior Inpatient Therapy Prior  Inpatient Therapy: Yes Prior Therapy Dates: 06/2013, 2009-2012 Prior Therapy Facilty/Provider(s): [facility], [facility] Reason for  Treatment: Alcohol dependence  Prior Outpatient Therapy: Prior Outpatient Therapy Prior Outpatient Therapy: Yes Prior Therapy Dates: until June 13 Prior Therapy Facilty/Provider(s): [place] Reason for Treatment: Depression  Additional Information: Additional Information 1:1 In Past 12 Months?: No CIRT Risk: No Elopement Risk: No Does patient have medical clearance?: Yes                  Objective: Blood pressure 115/81, pulse 81, temperature 98.6 F (37 C), temperature source Oral, resp. rate 16, SpO2 97.00%.There is no weight on file to calculate BMI. Results for orders placed during the hospital encounter of 08/16/13 (from the past 72 hour(s))  ACETAMINOPHEN LEVEL     Status: None   Collection Time    08/16/13  4:27 PM      Result Value Range   Acetaminophen (Tylenol), Serum <15.0  10 - 30 ug/mL   Comment:            THERAPEUTIC CONCENTRATIONS VARY     SIGNIFICANTLY. A RANGE OF 10-30     ug/mL MAY BE AN EFFECTIVE     CONCENTRATION FOR MANY PATIENTS.     HOWEVER, SOME ARE BEST TREATED     AT CONCENTRATIONS OUTSIDE THIS     RANGE.     ACETAMINOPHEN CONCENTRATIONS     >150 ug/mL AT 4 HOURS AFTER     INGESTION AND >50 ug/mL AT 12     HOURS AFTER INGESTION ARE     OFTEN ASSOCIATED WITH TOXIC     REACTIONS.  CBC     Status: Abnormal   Collection Time    08/16/13  4:27 PM      Result Value Range   WBC 5.9  4.0 - 10.5 K/uL   RBC 4.84  4.22 - 5.81 MIL/uL   Hemoglobin 12.9 (*) 13.0 - 17.0 g/dL   HCT 78.4 (*) 69.6 - 29.5 %   MCV 78.7  78.0 - 100.0 fL   MCH 26.7  26.0 - 34.0 pg   MCHC 33.9  30.0 - 36.0 g/dL   RDW 28.4  13.2 - 44.0 %   Platelets 168  150 - 400 K/uL  COMPREHENSIVE METABOLIC PANEL     Status: None   Collection Time    08/16/13  4:27 PM      Result Value Range   Sodium 138  135 - 145 mEq/L   Potassium 3.6  3.5 - 5.1 mEq/L   Chloride 104  96 - 112 mEq/L   CO2 25  19 - 32 mEq/L   Glucose, Bld 81  70 - 99 mg/dL   BUN 14   6 - 23 mg/dL   Creatinine, Ser 1.02  0.50 - 1.35 mg/dL   Calcium 9.3  8.4 - 72.5 mg/dL   Total Protein 7.3  6.0 - 8.3 g/dL   Albumin 4.0  3.5 - 5.2 g/dL   AST 22  0 - 37 U/L   ALT 19  0 - 53 U/L   Alkaline Phosphatase 56  39 - 117 U/L   Total Bilirubin 0.4  0.3 - 1.2 mg/dL   GFR calc non Af Amer >90  >90 mL/min   GFR calc Af Amer >90  >90 mL/min   Comment: (NOTE)     The eGFR has been calculated using the CKD EPI equation.     This calculation has not been validated in  all clinical situations.     eGFR's persistently <90 mL/min signify possible Chronic Kidney     Disease.  ETHANOL     Status: Abnormal   Collection Time    08/16/13  4:27 PM      Result Value Range   Alcohol, Ethyl (B) 122 (*) 0 - 11 mg/dL   Comment:            LOWEST DETECTABLE LIMIT FOR     SERUM ALCOHOL IS 11 mg/dL     FOR MEDICAL PURPOSES ONLY  SALICYLATE LEVEL     Status: Abnormal   Collection Time    08/16/13  4:27 PM      Result Value Range   Salicylate Lvl <2.0 (*) 2.8 - 20.0 mg/dL  URINE RAPID DRUG SCREEN (HOSP PERFORMED)     Status: None   Collection Time    08/16/13  5:06 PM      Result Value Range   Opiates NONE DETECTED  NONE DETECTED   Cocaine NONE DETECTED  NONE DETECTED   Benzodiazepines NONE DETECTED  NONE DETECTED   Amphetamines NONE DETECTED  NONE DETECTED   Tetrahydrocannabinol NONE DETECTED  NONE DETECTED   Barbiturates NONE DETECTED  NONE DETECTED   Comment:            DRUG SCREEN FOR MEDICAL PURPOSES     ONLY.  IF CONFIRMATION IS NEEDED     FOR ANY PURPOSE, NOTIFY LAB     WITHIN 5 DAYS.                LOWEST DETECTABLE LIMITS     FOR URINE DRUG SCREEN     Drug Class       Cutoff (ng/mL)     Amphetamine      1000     Barbiturate      200     Benzodiazepine   200     Tricyclics       300     Opiates          300     Cocaine          300     THC              50   Labs are reviewed and are pertinent for no psychiatric issue  Current Facility-Administered Medications   Medication Dose Route Frequency Provider Last Rate Last Dose  . LORazepam (ATIVAN) tablet 0-4 mg  0-4 mg Oral Q6H Suzi Roots, MD       Followed by  . [START ON 08/18/2013] LORazepam (ATIVAN) tablet 0-4 mg  0-4 mg Oral Q12H Suzi Roots, MD      . thiamine (VITAMIN B-1) tablet 100 mg  100 mg Oral Daily Suzi Roots, MD   100 mg at 08/17/13 0454   Or  . thiamine (B-1) injection 100 mg  100 mg Intravenous Daily Suzi Roots, MD      . zolpidem [facility]) tablet 5 mg  5 mg Oral QHS PRN Suzi Roots, MD   5 mg at 08/17/13 0022   Current Outpatient Prescriptions  Medication Sig Dispense Refill  . LORazepam (ATIVAN) 2 MG tablet Take 1 tablet (2 mg total) by mouth every 6 (six) hours as needed (alcohol withdrawal symptoms).  6 tablet  0  . traZODone (DESYREL) 50 MG tablet Take 50 mg by mouth at bedtime.        Psychiatric Specialty Exam:     Blood  pressure 115/81, pulse 81, temperature 98.6 F (37 C), temperature source Oral, resp. rate 16, SpO2 97.00%.There is no weight on file to calculate BMI.  General Appearance: Casual  Eye Contact::  Good  Speech:  Clear and Coherent  Volume:  Normal  Mood:  Depressed  Affect:  Appropriate  Thought Process:  Logical  Orientation:  Full (Time, Place, and Person)  Thought Content:  Negative  Suicidal Thoughts:  Yes.  with intent/plan  Homicidal Thoughts:  No  Memory:  Immediate;   Good Recent;   Good Remote;   Good  Judgement:  Intact  Insight:  Fair  Psychomotor Activity:  Normal  Concentration:  Good  Recall:  Good  Akathisia:  Negative  Handed:  Right  AIMS (if indicated):     Assets:  Communication Skills  Sleep:      Treatment Plan Summary: Daily contact with patient to assess and evaluate symptoms and progress in treatment Medication management recommend inpatient for treatment of alcohol detox and suicidal depression  Lilburn Straw D 08/17/2013 1:35 PM

## 2013-08-18 DIAGNOSIS — F1994 Other psychoactive substance use, unspecified with psychoactive substance-induced mood disorder: Secondary | ICD-10-CM

## 2013-08-18 DIAGNOSIS — F329 Major depressive disorder, single episode, unspecified: Secondary | ICD-10-CM | POA: Diagnosis present

## 2013-08-18 MED ORDER — CHLORDIAZEPOXIDE HCL 25 MG PO CAPS: 25.0000 mg | ORAL_CAPSULE | Freq: Every day | ORAL | Status: DC

## 2013-08-18 MED ORDER — CHLORDIAZEPOXIDE HCL 25 MG PO CAPS: 25.0000 mg | ORAL_CAPSULE | Freq: Four times a day (QID) | ORAL | Status: DC | PRN

## 2013-08-18 MED ORDER — CHLORDIAZEPOXIDE HCL 25 MG PO CAPS: 25.0000 mg | ORAL_CAPSULE | ORAL | Status: DC

## 2013-08-18 MED ORDER — ONDANSETRON 4 MG PO TBDP: 4.0000 mg | ORAL_TABLET | Freq: Four times a day (QID) | ORAL | Status: DC | PRN

## 2013-08-18 MED ORDER — ADULT MULTIVITAMIN W/MINERALS CH
1.0000 | ORAL_TABLET | Freq: Every day | ORAL | Status: DC
  Administered 2013-08-18 – 2013-08-19 (×2): 1 via ORAL
  Filled 2013-08-18 (×4): qty 1

## 2013-08-18 MED ORDER — VITAMIN B-1 100 MG PO TABS
100.0000 mg | ORAL_TABLET | Freq: Every day | ORAL | Status: DC
  Administered 2013-08-19: 100 mg via ORAL
  Filled 2013-08-18 (×3): qty 1

## 2013-08-18 MED ORDER — HYDROXYZINE HCL 25 MG PO TABS: 25.0000 mg | ORAL_TABLET | Freq: Four times a day (QID) | ORAL | Status: DC | PRN

## 2013-08-18 MED ORDER — CHLORDIAZEPOXIDE HCL 25 MG PO CAPS: 25.0000 mg | ORAL_CAPSULE | Freq: Once | ORAL | Status: DC

## 2013-08-18 MED ORDER — THIAMINE HCL 100 MG/ML IJ SOLN
100.0000 mg | Freq: Once | INTRAMUSCULAR | Status: AC
  Administered 2013-08-18: 100 mg via INTRAMUSCULAR
  Filled 2013-08-18: qty 2

## 2013-08-18 MED ORDER — CHLORDIAZEPOXIDE HCL 25 MG PO CAPS
25.0000 mg | ORAL_CAPSULE | Freq: Three times a day (TID) | ORAL | Status: AC
  Administered 2013-08-19 (×3): 25 mg via ORAL
  Filled 2013-08-18 (×3): qty 1

## 2013-08-18 MED ORDER — LOPERAMIDE HCL 2 MG PO CAPS: 2.0000 mg | ORAL_CAPSULE | ORAL | Status: DC | PRN

## 2013-08-18 MED ORDER — SUCRALFATE 1 G PO TABS
1.0000 g | ORAL_TABLET | Freq: Three times a day (TID) | ORAL | Status: DC
  Filled 2013-08-18 (×4): qty 1

## 2013-08-18 MED ORDER — CHLORDIAZEPOXIDE HCL 25 MG PO CAPS
25.0000 mg | ORAL_CAPSULE | Freq: Four times a day (QID) | ORAL | Status: AC
  Administered 2013-08-18 (×3): 25 mg via ORAL
  Filled 2013-08-18 (×3): qty 1

## 2013-08-18 NOTE — [facility] Group Notes (Signed)
[facility] LCSW Aftercare Discharge Planning Group Note   08/18/2013 9:54 AM  Participation Quality:  Appropriate   Mood/Affect:  Appropriate   Depression Rating:  6  Anxiety Rating:  7  Thoughts of Suicide:  No Will you contract for safety?   NA  Current AVH:  No  Plan for Discharge/Comments:  Pt reports that he had been staying with a friend after d/c from [facility] a few months ago. He spend a few weeks in jail and has since had charges dropped, but missed his [facility] admit date. Pt reports medication noncompliance and was not following up at [facility] as recommended. Pt stated that he is hoping to either get into [facility] and follow up at [facility] or go "back home to [place], [place]" to live with family.   Transportation Means: bus  Supports: family in [place]. Limited local supports.   Smart, [employer]

## 2013-08-18 NOTE — H&P (Signed)
Psychiatric Admission Assessment Adult  Patient Identification:  Chad Sanchez Date of Evaluation:  08/18/2013 Chief Complaint:  ALCOHOL DEPENDENCE History of Present Illness:: 42 Y/O male who states that after he left last time he went to jail for a month and a half. When he came out started drinking again. Became very depressed. Was drinking to get the courage to throw himself in front of a car. Drinking 12-18 pack and a fith a day. At least last couple of weeks. Has not pursued treatement for the depression since he left. Increasingly more depressed with suicidal ideas. Elements:  Location:  in patient. Quality:  unbael to function. Severity:  severe. Timing:  every day. Duration:  last few months. Context:  persistent alcohol use with underlying depression . Associated Signs/Synptoms: Depression Symptoms:  depressed mood, anhedonia, insomnia, fatigue, feelings of worthlessness/guilt, difficulty concentrating, hopelessness, suicidal thoughts with specific plan, anxiety, loss of energy/fatigue, disturbed sleep, decreased appetite, (Hypo) Manic Symptoms:  Irritable Mood, Labiality of Mood, Anxiety Symptoms:  Excessive Worry, Panic Symptoms, Psychotic Symptoms:  Denies PTSD Symptoms: Had a traumatic exposure:  one brother killed the other, he was there, has intrusive thoughts, nightmares. States he is always drinking so he does not have this happen that often  Psychiatric Specialty Exam: Physical Exam  Review of Systems  Constitutional: Negative.   HENT: Negative.   Eyes: Negative.   Respiratory: Negative.   Cardiovascular: Negative.   Gastrointestinal: Negative.   Genitourinary: Negative.   Musculoskeletal: Negative.   Skin: Negative.   Neurological: Negative.   Endo/Heme/Allergies: Negative.   Psychiatric/Behavioral: Positive for substance abuse. The patient is nervous/anxious and has insomnia.     Blood pressure 109/71, pulse 103, temperature 98.9 F (37.2 C),  temperature source Oral, resp. rate 18, height 6\' 1"  (1.854 m), weight 68.04 kg (150 lb).Body mass index is 19.79 kg/(m^2).  General Appearance: Disheveled  Eye Solicitor::  Fair  Speech:  Clear and Coherent  Volume:  Decreased  Mood:  Anxious, Depressed and worried  Affect:  anxious, worried  Thought Process:  Coherent and Goal Directed  Orientation:  Full (Time, Place, and Person)  Thought Content:  worries, concerns, symptoms  Suicidal Thoughts:  Yes.  without intent/plan  Homicidal Thoughts:  No  Memory:  Immediate;   Fair Recent;   Fair Remote;   Fair  Judgement:  Fair  Insight:  Present and Shallow  Psychomotor Activity:  Restlessness  Concentration:  Fair  Recall:  Fair  Akathisia:  No  Handed:    AIMS (if indicated):     Assets:  Desire for Improvement  Sleep:  Number of Hours: 6.75    Past Psychiatric History: Diagnosis:  Hospitalizations: [facility] [place]  Outpatient Care: Denies  Substance Abuse Care: [facility] 2011  Self-Mutilation:  Suicidal Attempts:  Violent Behaviors:   Past Medical History:   Past Medical History  Diagnosis Date  . Alcohol abuse   . Depression     Allergies:  No Known Allergies PTA Medications: Prescriptions prior to admission  Medication Sig Dispense Refill  . LORazepam (ATIVAN) 2 MG tablet Take 1 tablet (2 mg total) by mouth every 6 (six) hours as needed (alcohol withdrawal symptoms).  6 tablet  0  . traZODone (DESYREL) 50 MG tablet Take 50 mg by mouth at bedtime.        Previous Psychotropic Medications:  Medication/Dose  Zoloft               Substance Abuse History in the last 12 months:  yes  Consequences of Substance Abuse: Legal Consequences:  DWI Blackouts:   Withdrawal Symptoms:   Tremors  Social History:  reports that he has been smoking.  He does not have any smokeless tobacco history on file. He reports that he drinks about 7.2 ounces of alcohol per week. He reports that he does not use illicit  drugs. Additional Social History: History of alcohol / drug use?: Yes Negative Consequences of Use: Personal relationships;Work / Programmer, multimedia Withdrawal Symptoms: Regulatory affairs officer of Residence:  Living with a friend Scientist, research (physical sciences) of Birth:   Family Members: Marital Status:  Single Children:  Sons: 25, 26  Daughters:24 Relationships: Education:  2 years of college Business administration, Truck drining schoo;. job core Educational Problems/Performance: Religious Beliefs/Practices: History of Abuse (Emotional/Phsycial/Sexual) Teacher, music History:  None. Legal History: Robbery with dangerous weapon ( a beer) Hobbies/Interests:  Family History:  History reviewed. No pertinent family history.  Results for orders placed during the hospital encounter of 08/16/13 (from the past 72 hour(s))  ACETAMINOPHEN LEVEL     Status: None   Collection Time    08/16/13  4:27 PM      Result Value Range   Acetaminophen (Tylenol), Serum <15.0  10 - 30 ug/mL   Comment:            THERAPEUTIC CONCENTRATIONS VARY     SIGNIFICANTLY. A RANGE OF 10-30     ug/mL MAY BE AN EFFECTIVE     CONCENTRATION FOR MANY PATIENTS.     HOWEVER, SOME ARE BEST TREATED     AT CONCENTRATIONS OUTSIDE THIS     RANGE.     ACETAMINOPHEN CONCENTRATIONS     >150 ug/mL AT 4 HOURS AFTER     INGESTION AND >50 ug/mL AT 12     HOURS AFTER INGESTION ARE     OFTEN ASSOCIATED WITH TOXIC     REACTIONS.  CBC     Status: Abnormal   Collection Time    08/16/13  4:27 PM      Result Value Range   WBC 5.9  4.0 - 10.5 K/uL   RBC 4.84  4.22 - 5.81 MIL/uL   Hemoglobin 12.9 (*) 13.0 - 17.0 g/dL   HCT 78.2 (*) 95.6 - 21.3 %   MCV 78.7  78.0 - 100.0 fL   MCH 26.7  26.0 - 34.0 pg   MCHC 33.9  30.0 - 36.0 g/dL   RDW 08.6  57.8 - 46.9 %   Platelets 168  150 - 400 K/uL  COMPREHENSIVE METABOLIC PANEL     Status: None   Collection Time    08/16/13  4:27 PM      Result Value Range   Sodium 138   135 - 145 mEq/L   Potassium 3.6  3.5 - 5.1 mEq/L   Chloride 104  96 - 112 mEq/L   CO2 25  19 - 32 mEq/L   Glucose, Bld 81  70 - 99 mg/dL   BUN 14  6 - 23 mg/dL   Creatinine, Ser 6.29  0.50 - 1.35 mg/dL   Calcium 9.3  8.4 - 52.8 mg/dL   Total Protein 7.3  6.0 - 8.3 g/dL   Albumin 4.0  3.5 - 5.2 g/dL   AST 22  0 - 37 U/L   ALT 19  0 - 53 U/L   Alkaline Phosphatase 56  39 - 117 U/L  Total Bilirubin 0.4  0.3 - 1.2 mg/dL   GFR calc non Af Amer >90  >90 mL/min   GFR calc Af Amer >90  >90 mL/min   Comment: (NOTE)     The eGFR has been calculated using the CKD EPI equation.     This calculation has not been validated in all clinical situations.     eGFR's persistently <90 mL/min signify possible Chronic Kidney     Disease.  ETHANOL     Status: Abnormal   Collection Time    08/16/13  4:27 PM      Result Value Range   Alcohol, Ethyl (B) 122 (*) 0 - 11 mg/dL   Comment:            LOWEST DETECTABLE LIMIT FOR     SERUM ALCOHOL IS 11 mg/dL     FOR MEDICAL PURPOSES ONLY  SALICYLATE LEVEL     Status: Abnormal   Collection Time    08/16/13  4:27 PM      Result Value Range   Salicylate Lvl <2.0 (*) 2.8 - 20.0 mg/dL  URINE RAPID DRUG SCREEN (HOSP PERFORMED)     Status: None   Collection Time    08/16/13  5:06 PM      Result Value Range   Opiates NONE DETECTED  NONE DETECTED   Cocaine NONE DETECTED  NONE DETECTED   Benzodiazepines NONE DETECTED  NONE DETECTED   Amphetamines NONE DETECTED  NONE DETECTED   Tetrahydrocannabinol NONE DETECTED  NONE DETECTED   Barbiturates NONE DETECTED  NONE DETECTED   Comment:            DRUG SCREEN FOR MEDICAL PURPOSES     ONLY.  IF CONFIRMATION IS NEEDED     FOR ANY PURPOSE, NOTIFY LAB     WITHIN 5 DAYS.                LOWEST DETECTABLE LIMITS     FOR URINE DRUG SCREEN     Drug Class       Cutoff (ng/mL)     Amphetamine      1000     Barbiturate      200     Benzodiazepine   200     Tricyclics       300     Opiates          300     Cocaine           300     THC              50   Psychological Evaluations:  Assessment:   DSM5:  Schizophrenia Disorders:   Obsessive-Compulsive Disorders:   Trauma-Stressor Disorders:   Substance/Addictive Disorders:  Alcohol Related Disorder - Severe (303.90) Depressive Disorders:  Major Depressive Disorder - Severe (296.23)  AXIS I:  Substance Induced Mood Disorder AXIS II:  Deferred AXIS III:   Past Medical History  Diagnosis Date  . Alcohol abuse   . Depression    AXIS IV:  other psychosocial or environmental problems AXIS V:  41-50 serious symptoms  Treatment Plan/Recommendations:  Supportive approach/coping skills/relapse prevention                          [facility] detox protocol                          Reassess and address the depression  Treatment Plan Summary: Daily contact with patient to assess and evaluate symptoms and progress in treatment Medication management Current Medications:  Current Facility-Administered Medications  Medication Dose Route Frequency Provider Last Rate Last Dose  . acetaminophen (TYLENOL) tablet 650 mg  650 mg Oral Q6H PRN Benjaman Pott, MD      . alum & mag hydroxide-simeth (MAALOX/MYLANTA) 200-200-20 MG/5ML suspension 30 mL  30 mL Oral Q4H PRN Benjaman Pott, MD      . magnesium hydroxide (MILK OF MAGNESIA) suspension 30 mL  30 mL Oral Daily PRN Benjaman Pott, MD        Observation Level/Precautions:  15 minute checks  Laboratory:  As per the ED  Psychotherapy:  Individual/group  Medications:  [facility]rium detox, reassess for antidepressant  Consultations:    Discharge Concerns:    Estimated LOS: 3-5 days  Other:     I certify that inpatient services furnished can reasonably be expected to improve the patient's condition.   Quanta Roher A 10/15/20149:44 AM

## 2013-08-18 NOTE — Progress Notes (Signed)
Patient ID: Chad Sanchez, male   DOB: Mar 13, 1971, 42 y.o.   MRN: [id number] He has been up and to groups interacting with peers and staff. Has attended groups Swelf inventory: depression 6. Hopelessness 8 positive w/d symptoms and SI on and off..verbally contracted for safety. AT present he denies SI thought. Denies having and discomfort at this time.

## 2013-08-18 NOTE — [facility] Suicide Risk Assessment (Signed)
[facility] INPATIENT:  Family/Significant Other Suicide Prevention Education  Suicide Prevention Education:  Patient Refusal for Family/Significant Other Suicide Prevention Education: The patient Chad Sanchez has refused to provide written consent for family/significant other to be provided Family/Significant Other Suicide Prevention Education during admission and/or prior to discharge.  Physician notified.  SPE completed with pt and SPI pamphlet provided to him. Pt was encouraged to share with support network, talk about any concerns, and ask questions.   Smart, Orvell Careaga 08/18/2013, 3:00 PM

## 2013-08-18 NOTE — [facility] Suicide Risk Assessment (Signed)
Suicide Risk Assessment  Admission Assessment     Nursing information obtained from:  Patient Demographic factors:  Male;Low socioeconomic status Current Mental Status:  Suicidal ideation indicated by patient Loss Factors:  Loss of significant relationship Historical Factors:  Family history of mental illness or substance abuse Risk Reduction Factors:  Living with another person, especially a relative  CLINICAL FACTORS:   Depression:   Comorbid alcohol abuse/dependence Alcohol/Substance Abuse/Dependencies  COGNITIVE FEATURES THAT CONTRIBUTE TO RISK:  Closed-mindedness Polarized thinking Thought constriction (tunnel vision)    SUICIDE RISK:   Moderate:  Frequent suicidal ideation with limited intensity, and duration, some specificity in terms of plans, no associated intent, good self-control, limited dysphoria/symptomatology, some risk factors present, and identifiable protective factors, including available and accessible social support.  PLAN OF CARE: Supportive approach/coping skills/relapse prevention                               Reassess and address the co morbities  I certify that inpatient services furnished can reasonably be expected to improve the patient's condition.  Nicklas Mcsweeney A 08/18/2013, 4:26 PM

## 2013-08-18 NOTE — Progress Notes (Signed)
Attended NA meeting

## 2013-08-18 NOTE — [facility] Group Notes (Signed)
[facility] LCSW Group Therapy  08/18/2013 3:36 PM  Type of Therapy:  Group Therapy  Participation Level:  Active  Participation Quality:  Attentive  Affect:  Appropriate  Cognitive:  Alert and Oriented  Insight:  Engaged  Engagement in Therapy:  Engaged  Modes of Intervention:  Confrontation, Discussion, Education, Exploration, Socialization and Support  Summary of Progress/Problems: Emotion Regulation: This group focused on both positive and negative emotion identification and allowed group members to process ways to identify feelings, regulate negative emotions, and find healthy ways to manage internal/external emotions. Group members were asked to reflect on a time when their reaction to an emotion led to a negative outcome and explored how alternative responses using emotion regulation would have benefited them. Group members were also asked to discuss a time when emotion regulation was utilized when a negative emotion was experienced. Chad Sanchez was attentive and engaged throughout today's therapy group. He explained that the emotion he felt upon coming to [facility] was "embarressment." Chad Sanchez was encouraged to explore why he felt embarrassed and what this felt like to him both mentally and physically. "I was brought up not to ask for help and to keep my problems to myself. I felt like a failure and like I let my family and myself down." Chad Sanchez named "depression" as the emotion that he struggles with most. "I can't seem to find satisfaction in my life, I'm always chasing happiness and each time I relapse, the deeper I fall into depression." Chad Sanchez shows progress in the group setting and improving insight AEB his ability to process how depression leads to substance abuse (negative behavior), acknowledging that this is only a temporary fix, but leads to long term problems like addiction. Chad Sanchez was encouraged to explore a time in his life when he felt a sense of satisfaction. "I like to speak in front of  large groups of people about addiction, like I did at [facility]. 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     Smart, [name] 08/18/2013, 3:36 PM

## 2013-08-18 NOTE — [facility] Counselor (Signed)
Adult Psychosocial Assessment Update Interdisciplinary Team  Previous [facility] admissions/discharges:  Admissions Discharges  Date: 08/17/13 Date: unknown at this time.   Date: 06/04/13 Date: 06/07/13  Date: Date:  Date: Date:  Date: Date:   Changes since the last Psychosocial Assessment (including adherence to outpatient mental health and/or substance abuse treatment, situational issues contributing to decompensation and/or relapse). Pt reports he has recently relocated to [facility] after completing [facility] in July, but has relapsed on Alcohol. He reports he was sober for a while, but reports that his depressed mood often contributes to his failure. Pt went to jail for "a few weeks" after last d/c in August and failed to follow up at [facility] for med management. Pt reports med noncompliance since last d/c. "Nothing makes me happy. By me not being happy, I try to do something that makes me temporarily happy, which is drinking, but that just makes it worse." He reports currently drinking around a fifth of vodka and 12-18 beers daily, but this mornign had just a couple of beers to ease his shakiness. He states that he is currently thinking of committing suicide by walking in front of a car and states he's been drinking more to get the courage to do so. Pt currently living with a friend in [place].              Discharge Plan 1. Will you be returning to the same living situation after discharge?   Yes: No:      If no, what is your plan?    Pt hoping to either get into [facility] and follow up at [facility] for med management or move back to [place], [place] to live with family members.        2. Would you like a referral for services when you are discharged? Yes:     If yes, for what services?  No:       [facility] or [place], [place] [facility]. Pt currently deciding on plan.        Summary and Recommendations (to be completed by the  evaluator) Chad Sanchez is an 42 y.o. male who presents with suicidal ideation and seeking detox for alcohol. He reports that he has been drinking since the age of 58, with a few exceptions of time in rehab. He states he wants to feel better, but has trouble asking for help or accessing his family supports because of his embarrassment.  Recommendations for pt include: crisis stabilization, therapeutic milieu, encourage group attendance and participation, librium taper for withdrawals, medication management for mood stabilization, and development of comprehensive mental wellness/sobriety plan.                      Signature:  Chad Sanchez, 08/18/2013 10:34 AM

## 2013-08-18 NOTE — Progress Notes (Addendum)
D   Pt has been to groups today but declined to go to the outside group because he does not want to go outside   He reports minimal signs and symptoms of withdrawal  He denies suicidal and homicidal ideation   He does report feeling depressed and anxious    He said he hopes to sleep better tonight and he is presently in bed resting but not asleep A   Verbal support given  Medications administered and effectiveness monitored  Educate on ways to help him sleep and signs and symptoms of withdrawal   Monitored Q 15 min  R   Pt safe at present and verbalized understanding of instructions

## 2013-08-18 NOTE — Tx Team (Signed)
Interdisciplinary Treatment Plan Update (Adult)  Date: 08/18/2013   Time Reviewed: 11:40 AM  Progress in Treatment:  Attending groups: Yes  Participating in groups: Yes    Taking medication as prescribed: Yes  Tolerating medication: Yes  Family/Significant othe contact made: Not yet. SPE required for pt.  Patient understands diagnosis: Yes, AEB seeking treatment for SI, depression, medication stabilization, and ETOH detox. Discussing patient identified problems/goals with staff: Yes  Medical problems stabilized or resolved: Yes  Denies suicidal/homicidal ideation: Yes during group/self report.  Patient has not harmed self or Others: Yes  New problem(s) identified: n/a  Discharge Plan or Barriers: Pt hoping to either get into [facility]treatment program and follow up at [facility] for med management (in [place]) or plans to return home to [place], [place] to stay with family and follow up at mental health for med management in [place].  Additional comments: Chad Sanchez is an 42 y.o. male who presents with suicidal ideation and seeking detox for alcohol. He reports that he has been drinking since the age of 20, with a few exceptions of time in rehab. He reports he has recently relocated to [facility] after completing [facility] in July, but has relapsed on Alcohol. He reports he was sober for a while, but reports that his depressed mood often contributes to his failure. "Nothing makes me happy. By me not being happy, I try to do something that makes me temporarily happy, which is drinking, but that just makes it worse." He reports currently drinking around a fifth of vodka and 12-18 beers daily, but this mornign had just a couple of beers to ease his shakiness. He states that he is currently thinking of committing suicide by walking in front of a car and states he's been drinking more to get the courage to do so. He also reports that he has also woken up in the morning near the train tracks and  in other risky situations because he has been trying to get the courage to act on his plans. He states he came for detox a few nights ago, but did not admit that he was also suicidal because he was embarassed about it, which has also contributed to him getting off medications that were prescribed to him by his therapist and psychiatrist at the behavioral hospital in [place] last year. He states he wants to feel better, but has trouble asking for help or accessing his family supports because of his embarrassment. Reason for Continuation of Hospitalization: Librium taper-withdrawals Mood stabilization Medication management  Estimated length of stay: 3-5 days  For review of initial/current patient goals, please see plan of care.  Attendees:  Patient:    Family:    Physician: Geoffery Lyons MD 08/18/2013 11:39 AM   Nursing: Lupita Leash RN  08/18/2013 11:39 AM   Clinical Social Worker Halah Whiteside Smart, LCSWA  08/18/2013 11:39 AM   Other: Cicero Duck RN   Other:    Other: Massie Kluver, Community Care Coordinator  08/18/2013 11:39 AM   Other:    Scribe for Treatment Team:  [employer] LCSWA 08/18/2013 11:40 AM

## 2013-08-18 NOTE — Progress Notes (Deleted)
[facility] MD Progress Note  08/18/2013 3:22 PM Chad Sanchez  MRN:  [id number] Subjective:  Chad Sanchez states he is not doing well. He did not sleep well last night, he is having racing thoughts, as well as negative ones. States he has felt like hurting himself. He would not do it here and would go to staff if he was to have those thoughts again. He admits he feels very overwhelmed. The racing thoughts keeps him form being able to fall asleep.  Diagnosis:   DSM5: Schizophrenia Disorders:   Obsessive-Compulsive Disorders:   Trauma-Stressor Disorders:   Substance/Addictive Disorders:  Alcohol Related Disorder - Severe (303.90) Depressive Disorders:  Major Depressive Disorder - Moderate (296.22)  Axis I: Mood Disorder NOS  ADL's:  Intact  Sleep: Poor  Appetite:  Fair  Suicidal Ideation:  Plan:  denies Intent:  denies Means:  denies Homicidal Ideation:  Plan:  denies Intent:  denies Means:  denies AEB (as evidenced by):  Psychiatric Specialty Exam: Review of Systems  Constitutional: Negative.   HENT: Negative.   Eyes: Negative.   Respiratory: Negative.   Cardiovascular: Negative.   Gastrointestinal: Positive for abdominal pain.       Gas  Genitourinary: Negative.   Musculoskeletal: Negative.   Skin: Negative.   Neurological: Negative.   Endo/Heme/Allergies: Negative.   Psychiatric/Behavioral: Positive for depression, suicidal ideas and substance abuse. The patient is nervous/anxious and has insomnia.     Blood pressure 140/93, pulse 75, temperature 97.7 F (36.5 C), temperature source Oral, resp. rate 18, height 6\' 1"  (1.854 m), weight 68.04 kg (150 lb).Body mass index is 19.79 kg/(m^2).  General Appearance: Disheveled  Eye Solicitor::  Fair  Speech:  Clear and Coherent, Slow and not spontaneous  Volume:  Decreased  Mood:  Anxious, Depressed and Irritable  Affect:  Restricted  Thought Process:  Coherent and Goal Directed  Orientation:  Full (Time, Place, and Person)  Thought  Content:  worries, concerns, somatically focused  Suicidal Thoughts:  Yes.  without intent/plan  Homicidal Thoughts:  No  Memory:  Immediate;   Fair Recent;   Fair Remote;   Fair  Judgement:  Fair  Insight:  Present and Shallow  Psychomotor Activity:  Restlessness  Concentration:  Fair  Recall:  Fair  Akathisia:  No  Handed:    AIMS (if indicated):     Assets:  Desire for Improvement  Sleep:  Number of Hours: 6.75   Current Medications: Current Facility-Administered Medications  Medication Dose Route Frequency Provider Last Rate Last Dose  . acetaminophen (TYLENOL) tablet 650 mg  650 mg Oral Q6H PRN Benjaman Pott, MD      . alum & mag hydroxide-simeth (MAALOX/MYLANTA) 200-200-20 MG/5ML suspension 30 mL  30 mL Oral Q4H PRN Benjaman Pott, MD      . chlordiazePOXIDE (LIBRIUM) capsule 25 mg  25 mg Oral Q6H PRN Rachael Fee, MD      . chlordiazePOXIDE (LIBRIUM) capsule 25 mg  25 mg Oral QID Rachael Fee, MD   25 mg at 08/18/13 1147   Followed by  . [START ON 08/19/2013] chlordiazePOXIDE (LIBRIUM) capsule 25 mg  25 mg Oral TID Rachael Fee, MD       Followed by  . [START ON 08/20/2013] chlordiazePOXIDE (LIBRIUM) capsule 25 mg  25 mg Oral BH-qamhs Rachael Fee, MD       Followed by  . [START ON 08/21/2013] chlordiazePOXIDE (LIBRIUM) capsule 25 mg  25 mg Oral Daily Rachael Fee, MD      .  chlordiazePOXIDE (LIBRIUM) capsule 25 mg  25 mg Oral Once Rachael Fee, MD      . hydrOXYzine (ATARAX/VISTARIL) tablet 25 mg  25 mg Oral Q6H PRN Rachael Fee, MD      . loperamide (IMODIUM) capsule 2-4 mg  2-4 mg Oral PRN Rachael Fee, MD      . magnesium hydroxide (MILK OF MAGNESIA) suspension 30 mL  30 mL Oral Daily PRN Benjaman Pott, MD      . multivitamin with minerals tablet 1 tablet  1 tablet Oral Daily Rachael Fee, MD   1 tablet at 08/18/13 1148  . ondansetron (ZOFRAN-ODT) disintegrating tablet 4 mg  4 mg Oral Q6H PRN Rachael Fee, MD      . Melene Muller ON 08/19/2013] thiamine (VITAMIN  B-1) tablet 100 mg  100 mg Oral Daily Rachael Fee, MD        Lab Results:  Results for orders placed during the hospital encounter of 08/16/13 (from the past 48 hour(s))  ACETAMINOPHEN LEVEL     Status: None   Collection Time    08/16/13  4:27 PM      Result Value Range   Acetaminophen (Tylenol), Serum <15.0  10 - 30 ug/mL   Comment:            THERAPEUTIC CONCENTRATIONS VARY     SIGNIFICANTLY. A RANGE OF 10-30     ug/mL MAY BE AN EFFECTIVE     CONCENTRATION FOR MANY PATIENTS.     HOWEVER, SOME ARE BEST TREATED     AT CONCENTRATIONS OUTSIDE THIS     RANGE.     ACETAMINOPHEN CONCENTRATIONS     >150 ug/mL AT 4 HOURS AFTER     INGESTION AND >50 ug/mL AT 12     HOURS AFTER INGESTION ARE     OFTEN ASSOCIATED WITH TOXIC     REACTIONS.  CBC     Status: Abnormal   Collection Time    08/16/13  4:27 PM      Result Value Range   WBC 5.9  4.0 - 10.5 K/uL   RBC 4.84  4.22 - 5.81 MIL/uL   Hemoglobin 12.9 (*) 13.0 - 17.0 g/dL   HCT 16.1 (*) 09.6 - 04.5 %   MCV 78.7  78.0 - 100.0 fL   MCH 26.7  26.0 - 34.0 pg   MCHC 33.9  30.0 - 36.0 g/dL   RDW 40.9  81.1 - 91.4 %   Platelets 168  150 - 400 K/uL  COMPREHENSIVE METABOLIC PANEL     Status: None   Collection Time    08/16/13  4:27 PM      Result Value Range   Sodium 138  135 - 145 mEq/L   Potassium 3.6  3.5 - 5.1 mEq/L   Chloride 104  96 - 112 mEq/L   CO2 25  19 - 32 mEq/L   Glucose, Bld 81  70 - 99 mg/dL   BUN 14  6 - 23 mg/dL   Creatinine, Ser 7.82  0.50 - 1.35 mg/dL   Calcium 9.3  8.4 - 95.6 mg/dL   Total Protein 7.3  6.0 - 8.3 g/dL   Albumin 4.0  3.5 - 5.2 g/dL   AST 22  0 - 37 U/L   ALT 19  0 - 53 U/L   Alkaline Phosphatase 56  39 - 117 U/L   Total Bilirubin 0.4  0.3 - 1.2 mg/dL   GFR calc non Af  Amer >90  >90 mL/min   GFR calc Af Amer >90  >90 mL/min   Comment: (NOTE)     The eGFR has been calculated using the CKD EPI equation.     This calculation has not been validated in all clinical situations.     eGFR's  persistently <90 mL/min signify possible Chronic Kidney     Disease.  ETHANOL     Status: Abnormal   Collection Time    08/16/13  4:27 PM      Result Value Range   Alcohol, Ethyl (B) 122 (*) 0 - 11 mg/dL   Comment:            LOWEST DETECTABLE LIMIT FOR     SERUM ALCOHOL IS 11 mg/dL     FOR MEDICAL PURPOSES ONLY  SALICYLATE LEVEL     Status: Abnormal   Collection Time    08/16/13  4:27 PM      Result Value Range   Salicylate Lvl <2.0 (*) 2.8 - 20.0 mg/dL  URINE RAPID DRUG SCREEN (HOSP PERFORMED)     Status: None   Collection Time    08/16/13  5:06 PM      Result Value Range   Opiates NONE DETECTED  NONE DETECTED   Cocaine NONE DETECTED  NONE DETECTED   Benzodiazepines NONE DETECTED  NONE DETECTED   Amphetamines NONE DETECTED  NONE DETECTED   Tetrahydrocannabinol NONE DETECTED  NONE DETECTED   Barbiturates NONE DETECTED  NONE DETECTED   Comment:            DRUG SCREEN FOR MEDICAL PURPOSES     ONLY.  IF CONFIRMATION IS NEEDED     FOR ANY PURPOSE, NOTIFY LAB     WITHIN 5 DAYS.                LOWEST DETECTABLE LIMITS     FOR URINE DRUG SCREEN     Drug Class       Cutoff (ng/mL)     Amphetamine      1000     Barbiturate      200     Benzodiazepine   200     Tricyclics       300     Opiates          300     Cocaine          300     THC              50    Physical Findings: AIMS: Facial and Oral Movements Muscles of Facial Expression: None, normal Lips and Perioral Area: None, normal Jaw: None, normal Tongue: None, normal,Extremity Movements Upper (arms, wrists, hands, fingers): None, normal Lower (legs, knees, ankles, toes): None, normal, Trunk Movements Neck, shoulders, hips: None, normal, Overall Severity Severity of abnormal movements (highest score from questions above): None, normal Incapacitation due to abnormal movements: None, normal Patient's awareness of abnormal movements (rate only patient's report): No Awareness, Dental Status Current problems with teeth  and/or dentures?: No Does patient usually wear dentures?: No  CIWA:  CIWA-Ar Total: 1 COWS:  COWS Total Score: 1  Treatment Plan Summary: Daily contact with patient to assess and evaluate symptoms and progress in treatment Medication management  Plan: Supportive approach/coping skills/relapse prevention           Will address the GI symptoms will add Carafate           Will start Seroquel as a mood  stabilizer           Will come to staff is he was to experience the negative thoughts again Medical Decision Making Problem Points:  Review of psycho-social stressors (1) Data Points:  Review of medication regiment & side effects (2) Review of new medications or change in dosage (2)  I certify that inpatient services furnished can reasonably be expected to improve the patient's condition.   Khamya Topp A 08/18/2013, 3:22 PM

## 2013-08-19 NOTE — Progress Notes (Signed)
Pt. Resting quietly/ resp even unlabored. No signs of distress or discomfort noted. 
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
## 2013-08-19 NOTE — Progress Notes (Signed)
Patient did not attend the evening karaoke group. Pt remained in bed during group.  

## 2013-08-19 NOTE — Progress Notes (Signed)
Recreation Therapy Notes   Date: 10.16.2014 Time: 3:00pm Location: [address] Dayroom   Group Topic: Communication, Team Building, Problem Solving  Goal Area(s) Addresses:  Patient will effectively work with peer towards shared goal.  Patient will identify skill used to make activity successful.  Patient will identify how skills used during activity can be used to reach post d/c goals.   Behavioral Response: Did not attend.   Marykay Lex Nadie Fiumara, LRT/CTRS  Aubery Douthat L 08/19/2013 5:11 PM

## 2013-08-19 NOTE — Progress Notes (Signed)
[facility] Group Notes:  (Nursing/MHT/Case Management/Adjunct)  Date:  08/19/2013  Time:  10:22 AM  Type of Therapy:  AA Meeting   Summary of Progress/Problems: Pt attended the AA speaker group.  Chad Sanchez 08/19/2013, 10:22 AM 

## 2013-08-19 NOTE — Progress Notes (Signed)
[facility] MD Progress Note  08/19/2013 3:44 PM Tayte Hallum  MRN:  [id number] Subjective:  Savas states that he wants to go to [employer]. He states that he did well on the longer term program and he will like to do the same again. The fact that he has no charges pending has made things better as he has more options. States he is committed to make this work. 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   HENT: Negative.   Eyes: Negative.   Respiratory: Negative.   Cardiovascular: Negative.   Gastrointestinal: Negative.   Genitourinary: Negative.   Musculoskeletal: Negative.   Skin: Negative.   Neurological: Negative.   Endo/Heme/Allergies: Negative.   Psychiatric/Behavioral: Positive for depression and substance abuse. The patient is nervous/anxious.     Blood pressure 133/91, pulse 82, temperature 97.7 F (36.5 C), temperature source Oral, resp. rate 18, height 6\' 1"  (1.854 m), weight 68.04 kg (150 lb).Body mass index is 19.79 kg/(m^2).  General Appearance: Fairly Groomed  Patent attorney::  Fair  Speech:  Clear and Coherent  Volume:  Normal  Mood:  Anxious and worried  Affect:  Appropriate  Thought Process:  Coherent and Goal Directed  Orientation:  Full (Time, Place, and Person)  Thought Content:  worries, concerns  Suicidal Thoughts:  No  Homicidal Thoughts:   No  Memory:  Immediate;   Fair Recent;   Fair Remote;   Fair  Judgement:  Fair  Insight:  Present  Psychomotor Activity:  Restlessness  Concentration:  Fair  Recall:  Fair  Akathisia:  No  Handed:    AIMS (if indicated):     Assets:  Desire for Improvement  Sleep:  Number of Hours: 6.75   Current Medications: Current Facility-Administered Medications  Medication Dose Route Frequency Provider Last Rate Last Dose  . acetaminophen (TYLENOL) tablet 650 mg  650 mg Oral Q6H PRN Benjaman Pott, MD      . alum & mag hydroxide-simeth (MAALOX/MYLANTA) 200-200-20 MG/5ML suspension 30 mL  30 mL Oral Q4H PRN Benjaman Pott, MD      . chlordiazePOXIDE (LIBRIUM) capsule 25 mg  25 mg Oral Q6H PRN Rachael Fee, MD      . chlordiazePOXIDE (LIBRIUM) capsule 25 mg  25 mg Oral TID Rachael Fee, MD   25 mg at 08/19/13 1202   Followed by  . [START ON 08/20/2013] chlordiazePOXIDE (LIBRIUM) capsule 25 mg  25 mg Oral BH-qamhs Rachael Fee, MD       Followed by  . [START ON 08/21/2013] chlordiazePOXIDE (LIBRIUM) capsule 25 mg  25 mg Oral Daily Rachael Fee, MD      . chlordiazePOXIDE (LIBRIUM) capsule 25 mg  25 mg Oral Once Rachael Fee, MD      . hydrOXYzine (ATARAX/VISTARIL) tablet 25 mg  25 mg Oral Q6H PRN Rachael Fee, MD      . loperamide (IMODIUM) capsule 2-4  mg  2-4 mg Oral PRN Rachael Fee, MD      . magnesium hydroxide (MILK OF MAGNESIA) suspension 30 mL  30 mL Oral Daily PRN Benjaman Pott, MD      . multivitamin with minerals tablet 1 tablet  1 tablet Oral Daily Rachael Fee, MD   1 tablet at 08/19/13 [phone]  . ondansetron (ZOFRAN-ODT) disintegrating tablet 4 mg  4 mg Oral Q6H PRN Rachael Fee, MD      . thiamine (VITAMIN B-1) tablet 100 mg  100 mg Oral Daily Rachael Fee, MD   100 mg at 08/19/13 9604    Lab Results: No results found for this or any previous visit (from the past 48 hour(s)).  Physical Findings: AIMS: Facial and Oral Movements Muscles of Facial Expression: None,  normal Lips and Perioral Area: None, normal Jaw: None, normal Tongue: None, normal,Extremity Movements Upper (arms, wrists, hands, fingers): None, normal Lower (legs, knees, ankles, toes): None, normal, Trunk Movements Neck, shoulders, hips: None, normal, Overall Severity Severity of abnormal movements (highest score from questions above): None, normal Incapacitation due to abnormal movements: None, normal Patient's awareness of abnormal movements (rate only patient's report): No Awareness, Dental Status Current problems with teeth and/or dentures?: No Does patient usually wear dentures?: No  CIWA:  CIWA-Ar Total: 0 COWS:  COWS Total Score: 1  Treatment Plan Summary: Daily contact with patient to assess and evaluate symptoms and progress in treatment Medication management  Plan: Supportive approach/coping skills/relapse prevention           Complete the detox           Facilitate placement at [facility]  Medical Decision Making Problem Points:  Review of psycho-social stressors (1) Data Points:  Review of medication regiment & side effects (2)  I certify that inpatient services furnished can reasonably be expected to improve the patient's condition.   Sita Mangen A 08/19/2013, 3:44 PM

## 2013-08-19 NOTE — Progress Notes (Signed)
Adult Psychoeducational Group Note  Date:  08/19/2013 Time:  10:01 AM  Group Topic/Focus:  Orientation:   The focus of this group is to educate the patient on the purpose and policies of crisis stabilization and provide a format to answer questions about their admission.  The group details unit policies and expectations of patients while admitted.  Participation Level:  Minimal  Participation Quality:  Appropriate  Affect:  Appropriate  Cognitive:  Appropriate  Insight: Appropriate  Engagement in Group:  Engaged  Modes of Intervention:  Orientation  Additional Comments:  Pt participated in the morning exercises with the group. Pt stated his goal for today was to count his blessings because things could be worse.  Chad Sanchez 08/19/2013, 10:01 AM

## 2013-08-20 DIAGNOSIS — F1020 Alcohol dependence, uncomplicated: Secondary | ICD-10-CM

## 2013-08-20 MED ORDER — TRAZODONE HCL 50 MG PO TABS: 50.0000 mg | ORAL_TABLET | Freq: Every day | ORAL | Status: DC

## 2013-08-20 NOTE — Progress Notes (Signed)
[facility] Adult Case Management Discharge Plan :  Will you be returning to the same living situation after discharge: No. [facility]  At discharge, do you have transportation home?:Yes,  bus pass/PART $ in chart. Do you have the ability to pay for your medications:Yes,  mental health  Release of information consent forms completed and in the chart;  Patient's signature needed at discharge.  Patient to Follow up at: Follow-up Information   Follow up with [facility] On 08/23/2013. (Walk in between 8AM-11AM for hospital followup/medication management)    Contact information:   [address] [place], [place] [address] Phone: [phone] Fax: [phone]      Follow up with [facility] On 08/20/2013. (Admission on this date. )    Contact information:   [address] [place], [place] [address] Phone: [phone] Fax: [phone]      Patient denies SI/HI:   Yes,  during group/self report.    Safety Planning and Suicide Prevention discussed:  Yes,  Pt refused to consent to family contact. SPE completed with pt. SPI pamphlet provided to pt and he was encouraged to share information with support network, ask questions, and talk about concerns.   Smart, Joyceline Maiorino 08/20/2013, 10:29 AM

## 2013-08-20 NOTE — Discharge Summary (Signed)
Physician Discharge Summary Note  Patient:  Chad Sanchez is an 42 y.o., male MRN:  [id number] DOB:  18-Aug-1971 Patient phone:  [phone] (home)  Patient address:   [address] [place] [address]   Date of Admission:  08/17/2013 Date of Discharge: 08/20/2013  Discharge Diagnoses: Active Problems:   Alcohol dependence   Substance induced mood disorder   Major depression  Axis Diagnosis:  AXIS I: Substance Induced Mood Disorder, Alcohol Dependence  AXIS II: Deferred  AXIS III:  Past Medical History   Diagnosis  Date   .  Alcohol abuse    .  Depression     AXIS IV: housing problems, occupational problems and other psychosocial or environmental problems  AXIS V: 61-70 mild symptoms   Level of Care:  OP  Hospital Course:   42 Y/O male who states that after he left last time he went to jail for a month and a half. When he came out started drinking again. Became very depressed. Was drinking to get the courage to throw himself in front of a car. Drinking 12-18 pack and a fith a day. At least last couple of weeks. Has not pursued treatement for the depression since he left. Increasingly more depressed with suicidal ideas.  While a patient in this hospital, Chad Sanchez was enrolled in group counseling and activities as well as received the following medication Current facility-administered medications:acetaminophen (TYLENOL) tablet 650 mg, 650 mg, Oral, Q6H PRN, Benjaman Pott, MD;  alum & mag hydroxide-simeth (MAALOX/MYLANTA) 200-200-20 MG/5ML suspension 30 mL, 30 mL, Oral, Q4H PRN, Benjaman Pott, MD;  chlordiazePOXIDE (LIBRIUM) capsule 25 mg, 25 mg, Oral, Q6H PRN, Rachael Fee, MD;  chlordiazePOXIDE (LIBRIUM) capsule 25 mg, 25 mg, Oral, BH-qamhs, Rachael Fee, MD [START ON 08/21/2013] chlordiazePOXIDE (LIBRIUM) capsule 25 mg, 25 mg, Oral, Daily, Rachael Fee, MD;  chlordiazePOXIDE (LIBRIUM) capsule 25 mg, 25 mg, Oral, Once, Rachael Fee, MD;  hydrOXYzine  (ATARAX/VISTARIL) tablet 25 mg, 25 mg, Oral, Q6H PRN, Rachael Fee, MD;  loperamide (IMODIUM) capsule 2-4 mg, 2-4 mg, Oral, PRN, Rachael Fee, MD;  magnesium hydroxide (MILK OF MAGNESIA) suspension 30 mL, 30 mL, Oral, Daily PRN, Benjaman Pott, MD multivitamin with minerals tablet 1 tablet, 1 tablet, Oral, Daily, Rachael Fee, MD, 1 tablet at 08/19/13 0849;  ondansetron (ZOFRAN-ODT) disintegrating tablet 4 mg, 4 mg, Oral, Q6H PRN, Rachael Fee, MD;  thiamine (VITAMIN B-1) tablet 100 mg, 100 mg, Oral, Daily, Rachael Fee, MD, 100 mg at 08/19/13 2841 Current outpatient prescriptions:traZODone (DESYREL) 50 MG tablet, Take 1 tablet (50 mg total) by mouth at bedtime., Disp: , Rfl:  The patient was detoxed from alcohol with the librium detox protocol. His withdrawal symptoms were stabilized and his CIWA scores at d/c were zero. Patient attended treatment team meeting this am and met with treatment team members. Pt symptoms, treatment plan and response to treatment discussed. Chad Sanchez endorsed that their symptoms have improved. Pt also stated that they are stable for discharge.  In other to control Active Problems:   Alcohol dependence   Substance induced mood disorder   Major depression , they will continue psychiatric care on outpatient basis. They will follow-up at      Follow-up Information   Follow up with [facility] On 08/23/2013. (Walk in between 8AM-11AM for hospital followup/medication management)    Contact information:   [address] [place], [place] [address] Phone: [phone] Fax: [phone]  Follow up with [facility] On 08/20/2013. (Admission on this date. )    Contact information:   [address] [place], [place] [address] Phone: [phone] Fax: [phone]    .  In addition they were instructed to take all your medications as prescribed by your mental healthcare provider, to report any adverse effects and or reactions from your medicines to your outpatient  provider promptly, patient is instructed and cautioned to not engage in alcohol and or illegal drug use while on prescription medicines, in the event of worsening symptoms, patient is instructed to call the crisis hotline, 911 and or go to the nearest ED for appropriate evaluation and treatment of symptoms.   Upon discharge, patient adamantly denies suicidal, homicidal ideations, auditory, visual hallucinations and or delusional thinking. They left [facility] with all personal belongings in no apparent distress.  Consults:  See electronic record for details  Significant Diagnostic Studies:  See electronic record for details  Discharge Vitals:   Blood pressure 119/89, pulse 91, temperature 97.4 F (36.3 C), temperature source Oral, resp. rate 18, height 6\' 1"  (1.854 m), weight 68.04 kg (150 lb)..  Mental Status Exam: See Mental Status Examination and Suicide Risk Assessment completed by Attending Physician prior to discharge.  Discharge destination:  Home  Is patient on multiple antipsychotic therapies at discharge:  No  Has Patient had three or more failed trials of antipsychotic monotherapy by history: N/A Recommended Plan for Multiple Antipsychotic Therapies: N/A    Medication List    STOP taking these medications       LORazepam 2 MG tablet  Commonly known as:  ATIVAN      TAKE these medications     Indication   traZODone 50 MG tablet  Commonly known as:  DESYREL  Take 1 tablet (50 mg total) by mouth at bedtime.   Indication:  Trouble Sleeping       Follow-up Information   Follow up with [facility] On 08/23/2013. (Walk in between 8AM-11AM for hospital followup/medication management)    Contact information:   [address] [place], [place] [address] Phone: [phone] Fax: [phone]      Follow up with [facility] On 08/20/2013. (Admission on this date. )    Contact information:   [address] [place], [place] [address] Phone: [phone] Fax: [phone]      Follow-up recommendations:   Activities: Resume typical activities Diet: Resume typical diet Tests: none Other: Follow up with outpatient provider and report any side effects to out patient prescriber. Continue to work your relapse prevention plan Comments:  Take all your medications as prescribed by your mental healthcare provider. Report any adverse effects and or reactions from your medicines to your outpatient provider promptly. Patient is instructed and cautioned to not engage in alcohol and or illegal drug use while on prescription medicines. In the event of worsening symptoms, patient is instructed to call the crisis hotline, 911 and or go to the nearest ED for appropriate evaluation and treatment of symptoms. Follow-up with your primary care provider for your other medical issues, concerns and or health care needs.  SignedFransisca Kaufmann NP-C 08/20/2013 4:02 PM  Agree with assessment and plan Madie Reno A. Dub Mikes, M.D.

## 2013-08-20 NOTE — Progress Notes (Signed)
Pt observed resting in bed with eyes closed. RR WNL, even and unlabored. Level III obs in place for safety and pt remains safe. Hayley Horn Eakes  

## 2013-08-20 NOTE — BHH Suicide Risk Assessment (Signed)
Suicide Risk Assessment  Discharge Assessment     Demographic Factors:  Male and Unemployed  Mental Status Per Nursing Assessment::   On Admission:  Suicidal ideation indicated by patient  Current Mental Status by Physician: In full contact with reality. There are no suicidal ideas, plans or intent. His mood is euthymic, his affect is appropriate. He is not exhibiting any acute withdrawal. He is willing and motivated to pursue further work on his recovery by going to [facility]   Loss Factors: NA  Historical Factors: NA  Risk Reduction Factors:   wants to do better.  Continued Clinical Symptoms:  Alcohol/Substance Abuse/Dependencies  Cognitive Features That Contribute To Risk:  Polarized thinking Thought constriction (tunnel vision)    Suicide Risk:  Minimal: No identifiable suicidal ideation.  Patients presenting with no risk factors but with morbid ruminations; may be classified as minimal risk based on the severity of the depressive symptoms  Discharge Diagnoses:   AXIS I:  Substance Induced Mood Disorder, Alcohol Dependence AXIS II:  Deferred AXIS III:   Past Medical History  Diagnosis Date  . Alcohol abuse   . Depression    AXIS IV:  housing problems, occupational problems and other psychosocial or environmental problems AXIS V:  61-70 mild symptoms  Plan Of Care/Follow-up recommendations:  Activity:  as tolerated Diet:  regular Follow up [facility] Is patient on multiple antipsychotic therapies at discharge:  No   Has Patient had three or more failed trials of antipsychotic monotherapy by history:  No  Recommended Plan for Multiple Antipsychotic Therapies: NA  Chad Sanchez A 08/20/2013, 10:13 AM

## 2013-08-20 NOTE — BHH Group Notes (Signed)
[facility] LCSW Aftercare Discharge Planning Group Note   08/20/2013 10:20 AM  Participation Quality:  Appropriate   Mood/Affect:  Appropriate  Depression Rating:  0  Anxiety Rating:  0  Thoughts of Suicide:  No Will you contract for safety?   NA  Current AVH:  No  Plan for Discharge/Comments:  Pt reports that he is ready for d/c today. Pt will go to [facility] at d/c and has follow up appt at [facility] scheduled for Monday. Pt reports that he woke up "optimistic and hopeful for my future." Pt given bus pass and part bus money to get to [employer], [employer]. Pt also given two letters (in chart) that state his admission/d/c dates at the hospital.   Transportation Means: bus (passes/$ in chart).   Supports: [facility]/limited family support.   Smart, [employer]

## 2013-08-20 NOTE — Progress Notes (Signed)
Nrsg DC MD  Completed DC SRA as well as DC order in pt's chart. Pt stated he would not be going home on " any " meds, he denied SI, HI, and / or presence of audit, vis, tactile halluc. He states " no I don't want to take any medicines, when this nurse asks him about possibly needing prescriptions and sample meds to take home with him. He says: " I have'nt been taking anything here...". He is given DC AVS and he states he understands these instructions and that he can and will comply. He is also given bus tickets and cash via case management and then his belongings in his locker are returned to him and he is escorted to bldg entrance and DC'd home.

## 2013-08-24 NOTE — Progress Notes (Signed)
Patient Discharge Instructions:  After Visit Summary (AVS):   Faxed to:  08/24/13 Discharge Summary Note:   Faxed to:  08/24/13 Psychiatric Admission Assessment Note:   Faxed to:  08/24/13 Suicide Risk Assessment - Discharge Assessment:   Faxed to:  08/24/13 Faxed/Sent to the Next Level Care provider:  08/24/13 Faxed to [facility] @ [phone] Faxed to [facility] @ [phone]  Jerelene Redden, 08/24/2013, 3:03 PM

## 2013-08-25 ENCOUNTER — Encounter (HOSPITAL_COMMUNITY): Payer: Self-pay | Admitting: Emergency Medicine

## 2013-08-25 ENCOUNTER — Emergency Department (HOSPITAL_COMMUNITY)
Admission: EM | Admit: 2013-08-25 | Discharge: 2013-08-26 | Disposition: A | Discharge status: Other Acute Inpatient Hospital | Payer: Self-pay | Attending: Emergency Medicine | Admitting: Emergency Medicine

## 2013-08-25 DIAGNOSIS — R6883 Chills (without fever): Secondary | ICD-10-CM | POA: Insufficient documentation

## 2013-08-25 DIAGNOSIS — F1910 Other psychoactive substance abuse, uncomplicated: Secondary | ICD-10-CM

## 2013-08-25 DIAGNOSIS — F3289 Other specified depressive episodes: Secondary | ICD-10-CM | POA: Insufficient documentation

## 2013-08-25 DIAGNOSIS — R51 Headache: Secondary | ICD-10-CM | POA: Insufficient documentation

## 2013-08-25 DIAGNOSIS — F1010 Alcohol abuse, uncomplicated: Secondary | ICD-10-CM | POA: Insufficient documentation

## 2013-08-25 DIAGNOSIS — R45851 Suicidal ideations: Secondary | ICD-10-CM | POA: Insufficient documentation

## 2013-08-25 DIAGNOSIS — R112 Nausea with vomiting, unspecified: Secondary | ICD-10-CM | POA: Insufficient documentation

## 2013-08-25 DIAGNOSIS — F1410 Cocaine abuse, uncomplicated: Secondary | ICD-10-CM | POA: Insufficient documentation

## 2013-08-25 DIAGNOSIS — R259 Unspecified abnormal involuntary movements: Secondary | ICD-10-CM | POA: Insufficient documentation

## 2013-08-25 DIAGNOSIS — F329 Major depressive disorder, single episode, unspecified: Secondary | ICD-10-CM

## 2013-08-25 DIAGNOSIS — F17200 Nicotine dependence, unspecified, uncomplicated: Secondary | ICD-10-CM | POA: Insufficient documentation

## 2013-08-25 LAB — RAPID URINE DRUG SCREEN, HOSP PERFORMED
Amphetamines: NOT DETECTED
Barbiturates: NOT DETECTED
Cocaine: POSITIVE — AB
Opiates: NOT DETECTED
Tetrahydrocannabinol: NOT DETECTED

## 2013-08-25 LAB — CBC WITH DIFFERENTIAL/PLATELET
Basophils Absolute: 0 10*3/uL (ref 0.0–0.1)
Basophils Relative: 0 % (ref 0–1)
Eosinophils Absolute: 0.1 10*3/uL (ref 0.0–0.7)
HCT: 40.7 % (ref 39.0–52.0)
Hemoglobin: 13.7 g/dL (ref 13.0–17.0)
Lymphs Abs: 1.8 10*3/uL (ref 0.7–4.0)
MCH: 26.7 pg (ref 26.0–34.0)
MCHC: 33.7 g/dL (ref 30.0–36.0)
Monocytes Absolute: 0.8 10*3/uL (ref 0.1–1.0)
Monocytes Relative: 11 % (ref 3–12)
Neutrophils Relative %: 64 % (ref 43–77)
RBC: 5.14 MIL/uL (ref 4.22–5.81)

## 2013-08-25 LAB — POCT I-STAT TROPONIN I: Troponin i, poc: 0.01 ng/mL (ref 0.00–0.08)

## 2013-08-25 LAB — COMPREHENSIVE METABOLIC PANEL
ALT: 26 U/L (ref 0–53)
AST: 24 U/L (ref 0–37)
Albumin: 4.1 g/dL (ref 3.5–5.2)
CO2: 22 mEq/L (ref 19–32)
Calcium: 9.5 mg/dL (ref 8.4–10.5)
Creatinine, Ser: 0.91 mg/dL (ref 0.50–1.35)
Sodium: 141 mEq/L (ref 135–145)
Total Protein: 8 g/dL (ref 6.0–8.3)

## 2013-08-25 MED ORDER — IBUPROFEN 200 MG PO TABS: 600.0000 mg | ORAL_TABLET | Freq: Three times a day (TID) | ORAL | Status: DC | PRN

## 2013-08-25 MED ORDER — NICOTINE 21 MG/24HR TD PT24
21.0000 mg | MEDICATED_PATCH | Freq: Every day | TRANSDERMAL | Status: DC
  Filled 2013-08-25: qty 1

## 2013-08-25 MED ORDER — LORAZEPAM 1 MG PO TABS: 0.0000 mg | ORAL_TABLET | Freq: Two times a day (BID) | ORAL | Status: DC

## 2013-08-25 MED ORDER — ZOLPIDEM TARTRATE 5 MG PO TABS
5.0000 mg | ORAL_TABLET | Freq: Every evening | ORAL | Status: DC | PRN
  Administered 2013-08-25: 5 mg via ORAL
  Filled 2013-08-25: qty 1

## 2013-08-25 MED ORDER — ALUM & MAG HYDROXIDE-SIMETH 200-200-20 MG/5ML PO SUSP: 30.0000 mL | ORAL | Status: DC | PRN

## 2013-08-25 MED ORDER — FOLIC ACID 1 MG PO TABS
1.0000 mg | ORAL_TABLET | Freq: Every day | ORAL | Status: DC
  Administered 2013-08-26: 1 mg via ORAL
  Filled 2013-08-25: qty 1

## 2013-08-25 MED ORDER — ONDANSETRON HCL 4 MG PO TABS
4.0000 mg | ORAL_TABLET | Freq: Three times a day (TID) | ORAL | Status: DC | PRN
  Administered 2013-08-25: 4 mg via ORAL
  Filled 2013-08-25: qty 1

## 2013-08-25 MED ORDER — VITAMIN B-1 100 MG PO TABS
100.0000 mg | ORAL_TABLET | Freq: Every day | ORAL | Status: DC
  Administered 2013-08-26: 100 mg via ORAL
  Filled 2013-08-25: qty 1

## 2013-08-25 MED ORDER — THIAMINE HCL 100 MG/ML IJ SOLN: 100.0000 mg | Freq: Every day | INTRAMUSCULAR | Status: DC

## 2013-08-25 MED ORDER — ADULT MULTIVITAMIN W/MINERALS CH
1.0000 | ORAL_TABLET | Freq: Every day | ORAL | Status: DC
  Administered 2013-08-26: 1 via ORAL
  Filled 2013-08-25: qty 1

## 2013-08-25 MED ORDER — LORAZEPAM 1 MG PO TABS
0.0000 mg | ORAL_TABLET | Freq: Four times a day (QID) | ORAL | Status: DC
  Administered 2013-08-25 – 2013-08-26 (×2): 1 mg via ORAL
  Filled 2013-08-25 (×3): qty 1

## 2013-08-25 MED ORDER — LORAZEPAM 1 MG PO TABS
1.0000 mg | ORAL_TABLET | Freq: Four times a day (QID) | ORAL | Status: DC | PRN
  Administered 2013-08-26: 1 mg via ORAL

## 2013-08-25 MED ORDER — LORAZEPAM 2 MG/ML IJ SOLN: 1.0000 mg | Freq: Four times a day (QID) | INTRAMUSCULAR | Status: DC | PRN

## 2013-08-25 NOTE — ED Notes (Signed)
Here by EMS for detox. Mentions [facility]. Also "feels like may be starting withdrawal sx/DTs".

## 2013-08-25 NOTE — ED Notes (Signed)
Pt reports drinking for last week "enough to get me to black out." States that today he has had appx 6 beers. States smoked cocaine last night, reports cocaine abuse since 2004. Reports being clean from both cocaine and alcohol from 2009-2012. Reports stop taking zoloft which resulted in him abusing alcohol and drugs.

## 2013-08-25 NOTE — ED Notes (Addendum)
Pt states that he feel like he has been beinge drinking for the past week. Pt states that when he does detox he can have seizures. Pt states that his last drink was 5 hours ago. Pt states that he is also depressed and taking zoloft but has stopped taking the medication. Pt states he did have a bed a detox facility but that he did not get in so he started drinking. Pt states that his zoloft helped him not have SI thoughts but he has not taken it and states that several times today he jumped infront of cars on [address] to try and kill himself. Pt states that he has also been doing "lots of coccaine" and states last night was last use.

## 2013-08-25 NOTE — ED Notes (Signed)
Here by EMS, alert, NAD, calm, interactive, steady gait.

## 2013-08-25 NOTE — ED Provider Notes (Signed)
CSN: [id number]     Arrival date & time 08/25/13  2127 History   First MD Initiated Contact with Patient 08/25/13 2209     Chief Complaint  Patient presents with  . Medical Clearance  . Suicidal   HPI  History provided by the patient. The patient is a 42 year old male with history of alcohol abuse, polysubstance abuse and depression who presents with requests for help with his alcohol abuse, depression and suicidal thoughts. Patient reports long history of heavy alcohol abuse but states he has increased his alcohol use especially over the past few weeks. He also admits to recent cocaine use. Patient previously was taking Zoloft for his depression but stopped taking this several months ago. Today he was feeling more depressed after alcohol and cocaine use and reports being found in the streets trying to jump in front of cars. Patient realized that he needed help and came here with request for detox and help with his depression. Patient has been seen previously in the emergency department but states he did not follow through with any detox or rehabilitation facilities. He denies any previous hospitalizations for severe withdrawal from alcohol but does state that he has a past history of seizures and shaking when coming off of alcohol. No other aggravating or alleviating factors. No other associated symptoms.     Past Medical History  Diagnosis Date  . Alcohol abuse   . Depression    History reviewed. No pertinent past surgical history. No family history on file. History  Substance Use Topics  . Smoking status: Current Every Day Smoker -- 0.25 packs/day  . Smokeless tobacco: Not on file  . Alcohol Use: 7.2 oz/week    12 Cans of beer per week     Comment: a case a day; pt told this writer 12 pk daily     Review of Systems  Constitutional: Positive for chills. Negative for fever and diaphoresis.  Respiratory: Negative for cough and shortness of breath.   Cardiovascular: Negative for  chest pain.  Gastrointestinal: Positive for nausea and vomiting. Negative for abdominal pain, diarrhea and constipation.  Neurological: Positive for tremors and headaches. Negative for weakness and numbness.  All other systems reviewed and are negative.    Allergies  Review of patient's allergies indicates no known allergies.  Home Medications   Current Outpatient Rx  Name  Route  Sig  Dispense  Refill  . traZODone (DESYREL) 50 MG tablet   Oral   Take 1 tablet (50 mg total) by mouth at bedtime.          BP 128/88  Pulse 119  Temp(Src) 98.5 F (36.9 C) (Oral)  Resp 20  Wt 149 lb 11.2 oz (67.903 kg)  BMI 19.75 kg/m2  SpO2 99% Physical Exam  Nursing note and vitals reviewed. Constitutional: He is oriented to person, place, and time. He appears well-developed and well-nourished. No distress.  HENT:  Head: Normocephalic.  Cardiovascular: Normal rate and regular rhythm.   Pulmonary/Chest: Effort normal and breath sounds normal.  Abdominal: Soft. He exhibits no distension. There is no tenderness. There is no rebound and no guarding.  Musculoskeletal: Normal range of motion.  Neurological: He is alert and oriented to person, place, and time.  Skin: Skin is warm.  Psychiatric: He has a normal mood and affect. His behavior is normal.    ED Course  Procedures   COORDINATION OF CARE:  Nursing notes reviewed. Vital signs reviewed. Initial pt interview and examination performed.   Patient  seen and evaluated. Patient appears well no acute distress. Discussed work up plan with pt at bedside, which includes medical screening followed by TTS consultation. Pt agrees with plan.  Labs, EKG reviewed. No signs for concerning for emergent conditions. Patient is medically stable and cleared for further psychiatric evaluation and treatment.  Holding orders in place. TTS consult order.     Results for orders placed during the hospital encounter of 08/25/13  COMPREHENSIVE METABOLIC  PANEL      Result Value Range   Sodium 141  135 - 145 mEq/L   Potassium 4.1  3.5 - 5.1 mEq/L   Chloride 104  96 - 112 mEq/L   CO2 22  19 - 32 mEq/L   Glucose, Bld 81  70 - 99 mg/dL   BUN 19  6 - 23 mg/dL   Creatinine, Ser 4.09  0.50 - 1.35 mg/dL   Calcium 9.5  8.4 - 81.1 mg/dL   Total Protein 8.0  6.0 - 8.3 g/dL   Albumin 4.1  3.5 - 5.2 g/dL   AST 24  0 - 37 U/L   ALT 26  0 - 53 U/L   Alkaline Phosphatase 76  39 - 117 U/L   Total Bilirubin 0.3  0.3 - 1.2 mg/dL   GFR calc non Af Amer >90  >90 mL/min   GFR calc Af Amer >90  >90 mL/min  CBC WITH DIFFERENTIAL      Result Value Range   WBC 7.5  4.0 - 10.5 K/uL   RBC 5.14  4.22 - 5.81 MIL/uL   Hemoglobin 13.7  13.0 - 17.0 g/dL   HCT 91.4  78.2 - 95.6 %   MCV 79.2  78.0 - 100.0 fL   MCH 26.7  26.0 - 34.0 pg   MCHC 33.7  30.0 - 36.0 g/dL   RDW 21.3  08.6 - 57.8 %   Platelets 263  150 - 400 K/uL   Neutrophils Relative % 64  43 - 77 %   Neutro Abs 4.8  1.7 - 7.7 K/uL   Lymphocytes Relative 24  12 - 46 %   Lymphs Abs 1.8  0.7 - 4.0 K/uL   Monocytes Relative 11  3 - 12 %   Monocytes Absolute 0.8  0.1 - 1.0 K/uL   Eosinophils Relative 1  0 - 5 %   Eosinophils Absolute 0.1  0.0 - 0.7 K/uL   Basophils Relative 0  0 - 1 %   Basophils Absolute 0.0  0.0 - 0.1 K/uL  ETHANOL      Result Value Range   Alcohol, Ethyl (B) 42 (*) 0 - 11 mg/dL  URINE RAPID DRUG SCREEN (HOSP PERFORMED)      Result Value Range   Opiates NONE DETECTED  NONE DETECTED   Cocaine POSITIVE (*) NONE DETECTED   Benzodiazepines NONE DETECTED  NONE DETECTED   Amphetamines NONE DETECTED  NONE DETECTED   Tetrahydrocannabinol NONE DETECTED  NONE DETECTED   Barbiturates NONE DETECTED  NONE DETECTED  POCT I-STAT TROPONIN I      Result Value Range   Troponin i, poc 0.01  0.00 - 0.08 ng/mL   Comment 3            <ECGINTERP>  Date: 08/25/2013  Rate: 117  Rhythm: sinus tachycardia  QRS Axis: normal  Intervals: normal  ST/T Wave abnormalities: normal  Conduction  Disutrbances:none  Narrative Interpretation:   Old EKG Reviewed: unchanged    MDM   1. Alcohol abuse  2. Polysubstance abuse   3. Depression   4. Suicidal ideation         Angus Seller, [place] 08/26/13 [phone]

## 2013-08-26 ENCOUNTER — Inpatient Hospital Stay (HOSPITAL_COMMUNITY)
Admission: AD | Admit: 2013-08-26 | Discharge: 2013-08-27 | DRG: 897 | Disposition: A | Discharge status: Home / Self Care | Payer: No Typology Code available for payment source | Source: Intra-hospital | Attending: Psychiatry | Admitting: Psychiatry

## 2013-08-26 ENCOUNTER — Encounter (HOSPITAL_COMMUNITY): Payer: Self-pay

## 2013-08-26 DIAGNOSIS — F1410 Cocaine abuse, uncomplicated: Secondary | ICD-10-CM | POA: Diagnosis present

## 2013-08-26 DIAGNOSIS — F1994 Other psychoactive substance use, unspecified with psychoactive substance-induced mood disorder: Secondary | ICD-10-CM

## 2013-08-26 DIAGNOSIS — F329 Major depressive disorder, single episode, unspecified: Secondary | ICD-10-CM

## 2013-08-26 DIAGNOSIS — F1020 Alcohol dependence, uncomplicated: Principal | ICD-10-CM | POA: Diagnosis present

## 2013-08-26 MED ORDER — CHLORDIAZEPOXIDE HCL 25 MG PO CAPS
25.0000 mg | ORAL_CAPSULE | Freq: Four times a day (QID) | ORAL | Status: DC
  Administered 2013-08-26 (×2): 25 mg via ORAL
  Filled 2013-08-26 (×3): qty 1

## 2013-08-26 MED ORDER — CHLORDIAZEPOXIDE HCL 25 MG PO CAPS: 25.0000 mg | ORAL_CAPSULE | Freq: Three times a day (TID) | ORAL | Status: DC

## 2013-08-26 MED ORDER — ADULT MULTIVITAMIN W/MINERALS CH
1.0000 | ORAL_TABLET | Freq: Every day | ORAL | Status: DC
  Administered 2013-08-26 – 2013-08-27 (×2): 1 via ORAL
  Filled 2013-08-26 (×5): qty 1

## 2013-08-26 MED ORDER — CHLORDIAZEPOXIDE HCL 25 MG PO CAPS: 25.0000 mg | ORAL_CAPSULE | Freq: Every day | ORAL | Status: DC

## 2013-08-26 MED ORDER — MAGNESIUM HYDROXIDE 400 MG/5ML PO SUSP: 30.0000 mL | Freq: Every day | ORAL | Status: DC | PRN

## 2013-08-26 MED ORDER — HYDROXYZINE HCL 25 MG PO TABS: 25.0000 mg | ORAL_TABLET | Freq: Four times a day (QID) | ORAL | Status: DC | PRN

## 2013-08-26 MED ORDER — TRAZODONE HCL 50 MG PO TABS
50.0000 mg | ORAL_TABLET | Freq: Every evening | ORAL | Status: DC | PRN
  Administered 2013-08-26: 50 mg via ORAL
  Filled 2013-08-26: qty 1

## 2013-08-26 MED ORDER — CHLORDIAZEPOXIDE HCL 25 MG PO CAPS: 25.0000 mg | ORAL_CAPSULE | ORAL | Status: DC

## 2013-08-26 MED ORDER — CHLORDIAZEPOXIDE HCL 25 MG PO CAPS: 25.0000 mg | ORAL_CAPSULE | Freq: Four times a day (QID) | ORAL | Status: DC | PRN

## 2013-08-26 MED ORDER — LOPERAMIDE HCL 2 MG PO CAPS: 2.0000 mg | ORAL_CAPSULE | ORAL | Status: DC | PRN

## 2013-08-26 MED ORDER — ACETAMINOPHEN 325 MG PO TABS: 650.0000 mg | ORAL_TABLET | Freq: Four times a day (QID) | ORAL | Status: DC | PRN

## 2013-08-26 MED ORDER — ALUM & MAG HYDROXIDE-SIMETH 200-200-20 MG/5ML PO SUSP: 30.0000 mL | ORAL | Status: DC | PRN

## 2013-08-26 MED ORDER — ONDANSETRON 4 MG PO TBDP: 4.0000 mg | ORAL_TABLET | Freq: Four times a day (QID) | ORAL | Status: DC | PRN

## 2013-08-26 NOTE — ED Notes (Signed)
Patient belongings inventoried and clothing, shoes and book bag placed in box in the dirty utility room and wallet, cell phones, etc in the safe in the security room.

## 2013-08-26 NOTE — Progress Notes (Addendum)
Writer met with patient to explain voluntary consent for treatment and consent tor release information forms, and explain what to expect on the unit. Patient signed and dated forms. - Rodman Pickle, MHT  Writer faxed consent forms to Behavioral Health [phone] - Rodman Pickle, MHT

## 2013-08-26 NOTE — ED Notes (Signed)
Ambulated to the BR without difficulty.  Stated he felt a little unsteady.

## 2013-08-26 NOTE — ED Notes (Signed)
Maintain open door to check pt frequently, pt her for sucidal ideation, will continue to monitor

## 2013-08-26 NOTE — ED Provider Notes (Addendum)
Pt with ETOH/substance abuse, SI  Awaiting placement.  No issues today.  Filed Vitals:   08/26/13 0614  BP: 118/78  Pulse: 89  Temp: 98.1 F (36.7 C)  Resp: 18   12:10 pt has been accepted to [facility]  Rolan Bucco, MD 08/26/13 1610  Rolan Bucco, MD 08/26/13 1210

## 2013-08-26 NOTE — Progress Notes (Signed)
Pt. Admitted to unit. Ambulatory, no distress noted. Reports of having mild headache. Food offered. Support and encouragement given

## 2013-08-26 NOTE — Progress Notes (Signed)
D: Pt. Transferred from [facility]. Attempted to jump in front of a car. Pt. Stated that everything in his life is going wrong. " No matter what I try to do, it turns out bad." Pt. Stated he doesn't know how to handle stressful situation well. " I just drink, do powered, and crack to make things better. I know they don't get any better, but at the time it makes me feel better." Pt. Stated that he jumped in front of the car because, " if I wasn't alive I wouldn't have to worry about anything." Pt. 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 Pt stated on his previous discharge he was supposed to go to caring services, but when he got there, they did not have a bed for him. Pt. Stated that when he gets discharged this time he wants to go to another type of group home/ assistive services, but does not want to go to [facility], unless that is the only place that will take him. Pt. Stated while here he wants to work on his coping skills and his substance abuse problems. Pt. Support system consist of his brothers and friends. Denies SI/HI/AVH. Complains of a mild headache and agitation is 10 out of 10 A: pt. Given tour of the unit. Pt. Given food. Rules and procedures let known to pt. Q 15 min. Safety checks. Support and encouragement offered to pt.  R: Pt. Remains safe on unit.

## 2013-08-26 NOTE — [facility] Group Notes (Signed)
[facility] LCSW Group Therapy  08/26/2013 3:48 PM  Type of Therapy:  Group Therapy  Participation Level:  Did Not Attend  SmartHerbert Seta 08/26/2013, 3:48 PM

## 2013-08-26 NOTE — ED Notes (Signed)
Patient wanded by security. 

## 2013-08-26 NOTE — ED Notes (Signed)
Per Chad Sanchez, with BH, patient has been accepted to [facility] 300 hall.   Waiting on bed availability at this time.

## 2013-08-26 NOTE — ED Notes (Addendum)
Pt ambulatory to lobby to be transported to [facility] via [facility]. Pt belongings given to [facility].  Belongings sheet signed.

## 2013-08-26 NOTE — Progress Notes (Addendum)
Patient did not attend the evening karaoke group. Pt remained in bed sleeping during group.

## 2013-08-26 NOTE — ED Provider Notes (Signed)
Medical screening examination/treatment/procedure(s) were performed by non-physician practitioner and as supervising physician I was immediately available for consultation/collaboration.  EKG Interpretation   None         Shanna Cisco, MD 08/26/13 1158

## 2013-08-26 NOTE — BH Assessment (Signed)
Tele Assessment Note   Chad Sanchez is an 42 y.o. male.  Patient initially told ED staff at [facility] that he was suicidal w/ plan to jump in front of a car to kill self.  Patient was asked by this clinician if he was having thoughts to kill self and he said "no."  Patient also denied previous attempts to kills self.  Patient has been at [facility] in August and was also d/c'ed on 10/14 from 300 hall at [facility].  Patient presents that he wants detox but it has been less than 10 days since he was discharged from [facility].  Patient reports that he drinks 18 beers per day with last drink being around 14:00 on 10/22.  Patient also uses cocaine (powder) about 2x/W.  Patient denies any HI or A/V hallucinations. -Pt care was discussed with Dr. Ranae Palms.  He agreed with running patient by [facility] for placement consideration. -Patient needs to be run by [facility] for placement consideration.  Axis I: Alcohol Abuse and Major Depression, single episode Axis II: Deferred Axis III:  Past Medical History  Diagnosis Date  . Alcohol abuse   . Depression    Axis IV: housing problems, occupational problems, problems related to legal system/crime and problems with primary support group Axis V: 41-50 serious symptoms  Past Medical History:  Past Medical History  Diagnosis Date  . Alcohol abuse   . Depression     History reviewed. No pertinent past surgical history.  Family History: No family history on file.  Social History:  reports that he has been smoking.  He does not have any smokeless tobacco history on file. He reports that he drinks about 7.2 ounces of alcohol per week. He reports that he does not use illicit drugs.  Additional Social History:  Alcohol / Drug Use Pain Medications: Pt denies having any medications. Prescriptions: No medications Over the Counter: N/A History of alcohol / drug use?: Yes Longest period of sobriety (when/how long): 3 years from 2009-2012 Negative Consequences of Use: Legal Withdrawal  Symptoms: Agitation;Cramps;Blackouts;Weakness;Tremors;Patient aware of relationship between substance abuse and physical/medical complications;Tingling;Nausea / Vomiting Substance #1 Name of Substance 1: ETOH 1 - Age of First Use: 42 years of age 66 - Amount (size/oz): 18 beers per day 1 - Frequency: Daily 1 - Duration: On-going.  Pt was d/c'ed from 300 hall at [facility] on 10/14 1 - Last Use / Amount: 10/22 Substance #2 Name of Substance 2: Cocaine 2 - Age of First Use: 42 years of age 39 - Amount (size/oz): Varies 2 - Frequency: 2x/W 2 - Duration: Ongoing 2 - Last Use / Amount: 10/21  CIWA: CIWA-Ar BP: 118/78 mmHg Pulse Rate: 89 Nausea and Vomiting: mild nausea with no vomiting Tactile Disturbances: very mild itching, pins and needles, burning or numbness Tremor: two Auditory Disturbances: not present Paroxysmal Sweats: barely perceptible sweating, palms moist Visual Disturbances: not present Anxiety: mildly anxious Headache, Fullness in Head: none present Agitation: normal activity Orientation and Clouding of Sensorium: oriented and can do serial additions CIWA-Ar Total: 6 COWS:    Allergies: No Known Allergies  Home Medications:  (Not in a hospital admission)  OB/GYN Status:  No LMP for male patient.  General Assessment Data Location of Assessment: [facility] ED Is this a Tele or Face-to-Face Assessment?: Tele Assessment Is this an Initial Assessment or a Re-assessment for this encounter?: Initial Assessment Living Arrangements: Non-relatives/Friends Can pt return to current living arrangement?: No Admission Status: Voluntary Is patient capable of signing voluntary admission?: Yes  Transfer from: Acute Hospital Referral Source: Self/Family/Friend     [facility] Crisis Care Plan Living Arrangements: Non-relatives/Friends Name of Psychiatrist: None Name of Therapist: None     Risk to self Suicidal Ideation: No-Not Currently/Within Last 6 Months Suicidal Intent: No-Not  Currently/Within Last 6 Months Is patient at risk for suicide?: Yes Suicidal Plan?: No-Not Currently/Within Last 6 Months (Pt denied to this clinician but it is documented on ED arriv) Specify Current Suicidal Plan: Jump in front of cars Access to Means: Yes Specify Access to Suicidal Means: Traffic What has been your use of drugs/alcohol within the last 12 months?: Drinking ETOH Previous Attempts/Gestures: Yes How many times?:  (Unknown) Other Self Harm Risks: N/A Triggers for Past Attempts: None known Intentional Self Injurious Behavior: None Comment - Self Injurious Behavior: None reported Family Suicide History: No Recent stressful life event(s): Legal Issues Persecutory voices/beliefs?: No Depression: Yes Depression Symptoms: Despondent;Guilt;Isolating;Loss of interest in usual pleasures;Feeling worthless/self pity Substance abuse history and/or treatment for substance abuse?: Yes Suicide prevention information given to non-admitted patients: Not applicable  Risk to Others Homicidal Ideation: No Thoughts of Harm to Others: No Current Homicidal Intent: No Current Homicidal Plan: No Access to Homicidal Means: No Identified Victim: No one History of harm to others?: No Assessment of Violence: None Noted Violent Behavior Description: Pt calm and cooperative Does patient have access to weapons?: No Criminal Charges Pending?: Yes Describe Pending Criminal Charges: Misdemeanor larceny Does patient have a court date: Yes Court Date:  (Pt says it is in November)  Psychosis Hallucinations: None noted Delusions: None noted  Mental Status Report Appear/Hygiene:  (Casual) Eye Contact: Good Motor Activity: Freedom of movement;Unremarkable Speech: Logical/coherent Level of Consciousness: Alert Mood: Depressed;Despair Affect: Depressed Anxiety Level: Moderate Panic attack frequency: Denies Most recent panic attack: Denies Thought Processes: Coherent;Relevant Judgement:  Unimpaired Orientation: Person;Place;Time;Situation Obsessive Compulsive Thoughts/Behaviors: None  Cognitive Functioning Concentration: Decreased Memory: Recent Impaired;Remote Intact IQ: Average Insight: Fair Impulse Control: Fair Appetite: Poor Weight Loss:  (Has been drinking only for last 2 days) Weight Gain: 0 Sleep: Decreased Total Hours of Sleep:  (Unknown, drinks till he blacks out.) Vegetative Symptoms: None  ADLScreening [facility]) Patient's cognitive ability adequate to safely complete daily activities?: Yes Patient able to express need for assistance with ADLs?: Yes Independently performs ADLs?: Yes (appropriate for developmental age)  Prior Inpatient Therapy Prior Inpatient Therapy: Yes Prior Therapy Dates: 08/2013,06/2013, 2009-2012 Prior Therapy Facilty/Provider(s): [facility], [facility] Reason for Treatment: Alcohol dependence  Prior Outpatient Therapy Prior Outpatient Therapy: Yes Prior Therapy Dates: until June 13 Prior Therapy Facilty/Provider(s): [place], [facility] Reason for Treatment: Depression  ADL Screening (condition at time of admission) Patient's cognitive ability adequate to safely complete daily activities?: Yes Is the patient deaf or have difficulty hearing?: No Does the patient have difficulty seeing, even when wearing glasses/contacts?: No Does the patient have difficulty concentrating, remembering, or making decisions?: No Patient able to express need for assistance with ADLs?: Yes Does the patient have difficulty dressing or bathing?: No Independently performs ADLs?: Yes (appropriate for developmental age) Does the patient have difficulty walking or climbing stairs?: No Weakness of Legs: None Weakness of Arms/Hands: None       Abuse/Neglect Assessment (Assessment to be complete while patient is alone) Physical Abuse: Denies Verbal Abuse: Denies Sexual Abuse: Denies Exploitation of patient/patient's  resources: Denies Self-Neglect: Denies     Merchant navy officer (For Healthcare) Advance Directive: Patient does not have advance directive;Patient would not like information    Additional  Information 1:1 In Past 12 Months?: No CIRT Risk: No Elopement Risk: No Does patient have medical clearance?: Yes     Disposition:  Disposition Initial Assessment Completed for this Encounter: Yes Disposition of Patient: Inpatient treatment program Type of inpatient treatment program: Adult Other disposition(s): Other (Comment) (Run by [facility] if they will take again.)  [place] 08/26/2013 6:42 AM

## 2013-08-27 ENCOUNTER — Encounter (HOSPITAL_COMMUNITY): Payer: Self-pay | Admitting: Psychiatry

## 2013-08-27 DIAGNOSIS — F1994 Other psychoactive substance use, unspecified with psychoactive substance-induced mood disorder: Secondary | ICD-10-CM

## 2013-08-27 NOTE — Progress Notes (Signed)
Patient ID: Chad Sanchez, male   DOB: 04/22/1971, 42 y.o.   MRN: [id number] D: pt. Lying in bed eyes closed. A: Writer assessed for s/s of distress. R: No distress noted, resp. Even unlabored.

## 2013-08-27 NOTE — Progress Notes (Signed)
D. Pt has been in his room for much of the evening, very minimal interaction or participation in the milieu. Pt did receive HS medications without incident. Pt did speak about being here for depression and also because of substance abuse issues. Pt has a flat affect and appears sad and depressed this evening. Pt able to contract for safety on the unit. A. Support and encouragement provided, medication education given. R. Pt verbalized understanding, safety maintained.

## 2013-08-27 NOTE — H&P (Signed)
Psychiatric Admission Assessment Adult  Patient Identification:  Chad Sanchez Date of Evaluation:  08/27/2013 Chief Complaint:  ETOH DEPENDENCY History of Present Illness::42 Y/o male who was D/C to go to [employer]. Says he was not able to get to the shelter in [facility] to get into [facility]. He came back in [place]. He continued drinking a little more than 12 pack a day. He also smoked crack. He came back to the ED, as he was wanting to quit and get help with the tremors. He states he did not think he was going to be admitted. He was wanting to be given " a little something " to help with the tremors. He states he has decided to go back home to [facility].  Elements:  Location:  in patient. Quality:  went back to drink. Severity:  mild-moderate. Timing:  every day. Duration:  every day. Context:  unable to get in the long tem program he was wanting to get to, relapsed. Associated Signs/Synptoms: Depression Symptoms:  Denies (Hypo) Manic Symptoms:  Denies Anxiety Symptoms:  Excessive Worry, Psychotic Symptoms:  Denies PTSD Symptoms: Negative  Psychiatric Specialty Exam: Physical Exam  Review of Systems  Constitutional: Negative.   HENT: Negative.   Eyes: Negative.   Respiratory: Negative.   Cardiovascular: Negative.   Gastrointestinal: Negative.   Genitourinary: Negative.   Musculoskeletal: Negative.   Skin: Negative.   Neurological: Negative.   Endo/Heme/Allergies: Negative.   Psychiatric/Behavioral: Positive for substance abuse. The patient is nervous/anxious.     Blood pressure 114/82, pulse 108, temperature 97.9 F (36.6 C), temperature source Oral, resp. rate 18, height 6\' 2"  (1.88 m), weight 68.04 kg (150 lb).Body mass index is 19.25 kg/(m^2).  General Appearance: Fairly Groomed  Patent attorney::  Fair  Speech:  Clear and Coherent  Volume:  Normal  Mood:  worried  Affect:  Appropriate  Thought Process:  Coherent and Goal Directed   Orientation:  Full (Time, Place, and Person)  Thought Content:  plans of hjow to get his life back together  Suicidal Thoughts:  No  Homicidal Thoughts:  No  Memory:  Immediate;   Fair Recent;   Fair Remote;   Fair  Judgement:  Fair  Insight:  Present  Psychomotor Activity:  Normal  Concentration:  Fair  Recall:  Fair  Akathisia:  No  Handed:    AIMS (if indicated):     Assets:  Desire for Improvement  Sleep:  Number of Hours: 6.75    Past Psychiatric History: Diagnosis: Alcohol Dependence, Depressive Disorder NOS  Hospitalizations: [facility]   Outpatient Care:  Substance Abuse Care: [facility]   Self-Mutilation: Denies  Suicidal Attempts: Denies  Violent Behaviors: Denies   Past Medical History:   Past Medical History  Diagnosis Date  . Alcohol abuse   . Depression     Allergies:  No Known Allergies PTA Medications: No prescriptions prior to admission    Previous Psychotropic Medications:  Medication/Dose  Zoloft               Substance Abuse History in the last 12 months:  yes  Consequences of Substance Abuse: Legal Consequences:  DWI Blackouts:   Withdrawal Symptoms:   Nausea Tremors  Social History:  reports that he has been smoking.  He does not have any smokeless tobacco history on file. He reports that he drinks about 7.2 ounces of alcohol per week. He reports that he uses illicit drugs ("Crack" cocaine and Cocaine). Additional Social History: Pain Medications:  Pt denies having any medications. 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 Legal History: robbery with weapon, dismissed Hobbies/Interests:  Family History:  History reviewed. No pertinent family history.  Results for orders placed during the hospital encounter of 08/25/13 (from the past 72 hour(s))  COMPREHENSIVE METABOLIC PANEL     Status: None   Collection Time    08/25/13 10:00 PM      Result Value Range   Sodium 141  135 - 145 mEq/L   Potassium 4.1  3.5 - 5.1 mEq/L   Chloride 104  96 - 112 mEq/L   CO2 22  19 - 32 mEq/L   Glucose, Bld 81  70 - 99 mg/dL   BUN 19  6 - 23 mg/dL   Creatinine, Ser 0.98  0.50 - 1.35 mg/dL   Calcium 9.5  8.4 - 11.9 mg/dL   Total Protein 8.0  6.0 - 8.3 g/dL   Albumin 4.1  3.5 - 5.2 g/dL   AST 24  0 - 37 U/L   ALT 26  0 - 53 U/L   Alkaline Phosphatase 76  39 - 117 U/L   Total Bilirubin 0.3  0.3 - 1.2 mg/dL   GFR calc non Af Amer >90  >90 mL/min   GFR calc Af Amer >90  >90 mL/min   Comment: (NOTE)     The eGFR has been calculated using the CKD EPI equation.     This calculation has not been validated in all clinical situations.     eGFR's persistently <90 mL/min signify possible Chronic Kidney     Disease.  CBC WITH DIFFERENTIAL     Status: None   Collection Time    08/25/13 10:00 PM      Result Value Range   WBC 7.5  4.0 - 10.5 K/uL   RBC 5.14  4.22 - 5.81 MIL/uL   Hemoglobin 13.7  13.0 - 17.0 g/dL   HCT 14.7  82.9 - 56.2 %   MCV 79.2  78.0 - 100.0 fL   MCH 26.7  26.0 - 34.0 pg   MCHC 33.7  30.0 - 36.0 g/dL   RDW 13.0  86.5 - 78.4 %   Platelets 263  150 - 400 K/uL   Neutrophils Relative % 64  43 - 77 %   Neutro Abs 4.8  1.7 - 7.7 K/uL   Lymphocytes Relative 24  12 - 46 %   Lymphs Abs 1.8  0.7 -  4.0 K/uL   Monocytes Relative 11  3 - 12 %   Monocytes Absolute 0.8  0.1 - 1.0 K/uL   Eosinophils Relative 1  0 - 5 %   Eosinophils Absolute 0.1  0.0 - 0.7 K/uL   Basophils Relative 0  0 - 1 %   Basophils Absolute 0.0  0.0 - 0.1 K/uL  ETHANOL     Status: Abnormal  Collection Time    08/25/13 10:00 PM      Result Value Range   Alcohol, Ethyl (B) 42 (*) 0 - 11 mg/dL   Comment:            LOWEST DETECTABLE LIMIT FOR     SERUM ALCOHOL IS 11 mg/dL     FOR MEDICAL PURPOSES ONLY  URINE RAPID DRUG SCREEN (HOSP PERFORMED)     Status: Abnormal   Collection Time    08/25/13 10:13 PM      Result Value Range   Opiates NONE DETECTED  NONE DETECTED   Cocaine POSITIVE (*) NONE DETECTED   Benzodiazepines NONE DETECTED  NONE DETECTED   Amphetamines NONE DETECTED  NONE DETECTED   Tetrahydrocannabinol NONE DETECTED  NONE DETECTED   Barbiturates NONE DETECTED  NONE DETECTED   Comment:            DRUG SCREEN FOR MEDICAL PURPOSES     ONLY.  IF CONFIRMATION IS NEEDED     FOR ANY PURPOSE, NOTIFY LAB     WITHIN 5 DAYS.                LOWEST DETECTABLE LIMITS     FOR URINE DRUG SCREEN     Drug Class       Cutoff (ng/mL)     Amphetamine      1000     Barbiturate      200     Benzodiazepine   200     Tricyclics       300     Opiates          300     Cocaine          300     THC              50  POCT I-STAT TROPONIN I     Status: None   Collection Time    08/25/13 10:41 PM      Result Value Range   Troponin i, poc 0.01  0.00 - 0.08 ng/mL   Comment 3            Comment: Due to the release kinetics of cTnI,     a negative result within the first hours     of the onset of symptoms does not rule out     myocardial infarction with certainty.     If myocardial infarction is still suspected,     repeat the test at appropriate intervals.   Psychological Evaluations:  Assessment:   DSM5:  Schizophrenia Disorders:   Obsessive-Compulsive Disorders:   Trauma-Stressor Disorders:    Substance/Addictive Disorders:  Alcohol Related Disorder - Severe (303.90) Depressive Disorders:  Major Depressive Disorder - Mild (296.21)  AXIS I:  Substance Induced Mood Disorder AXIS II:  Deferred AXIS III:   Past Medical History  Diagnosis Date  . Alcohol abuse   . Depression    AXIS IV:  other psychosocial or environmental problems AXIS V:  61-70 mild symptoms  Treatment Plan/Recommendations:  Supportive approach/coping skills/relapse prevention                                                                 Identify detox needs  He is wanting to be D/C today   Treatment Plan Summary: Daily contact with patient to assess and evaluate symptoms and progress in treatment Medication management Current Medications:  Current Facility-Administered Medications  Medication Dose Route Frequency Provider Last Rate Last Dose  . acetaminophen (TYLENOL) tablet 650 mg  650 mg Oral Q6H PRN Fransisca Kaufmann, NP      . alum & mag hydroxide-simeth (MAALOX/MYLANTA) 200-200-20 MG/5ML suspension 30 mL  30 mL Oral Q4H PRN Fransisca Kaufmann, NP      . chlordiazePOXIDE (LIBRIUM) capsule 25 mg  25 mg Oral Q6H PRN Fransisca Kaufmann, NP      . chlordiazePOXIDE (LIBRIUM) capsule 25 mg  25 mg Oral QID Fransisca Kaufmann, NP   25 mg at 08/26/13 2211   Followed by  . [START ON 08/28/2013] chlordiazePOXIDE (LIBRIUM) capsule 25 mg  25 mg Oral TID Fransisca Kaufmann, NP       Followed by  . [START ON 08/29/2013] chlordiazePOXIDE (LIBRIUM) capsule 25 mg  25 mg Oral BH-qamhs Fransisca Kaufmann, NP       Followed by  . [START ON 08/30/2013] chlordiazePOXIDE (LIBRIUM) capsule 25 mg  25 mg Oral Daily Fransisca Kaufmann, NP      . hydrOXYzine (ATARAX/VISTARIL) tablet 25 mg  25 mg Oral Q6H PRN Fransisca Kaufmann, NP      . loperamide (IMODIUM) capsule 2-4 mg  2-4 mg Oral PRN Fransisca Kaufmann, NP      . magnesium hydroxide (MILK OF MAGNESIA) suspension 30 mL  30 mL Oral Daily PRN Fransisca Kaufmann, NP      .  multivitamin with minerals tablet 1 tablet  1 tablet Oral Daily Fransisca Kaufmann, NP   1 tablet at 08/27/13 [id number]  . ondansetron (ZOFRAN-ODT) disintegrating tablet 4 mg  4 mg Oral Q6H PRN Fransisca Kaufmann, NP      . traZODone (DESYREL) tablet 50 mg  50 mg Oral QHS PRN,MR X 1 Fransisca Kaufmann, NP   50 mg at 08/26/13 2212    Observation Level/Precautions:  15 minute checks  Laboratory:  As per the ED  Psychotherapy:  Individual/group  Medications:  Librium if needed  Consultations:    Discharge Concerns:    Estimated LOS: wants to be D/C today  Other:     I certify that inpatient services furnished can reasonably be expected to improve the patient's condition.   Haniah Penny A 10/24/20149:44 AM

## 2013-08-27 NOTE — BHH Counselor (Signed)
Adult Psychosocial Assessment Update Interdisciplinary Team  Previous [facility] Health Hospital admissions/discharges:  Admissions Discharges  Date: 08/26/13 Date: 08/27/13  Date: 08/17/13 Date: 08/20/13  Date: 06/04/13 Date: 06/07/13  Date: Date:  Date: Date:   Changes since the last Psychosocial Assessment (including adherence to outpatient mental health and/or substance abuse treatment, situational issues contributing to decompensation and/or relapse). Pt reports that there was not a bed available [facility]. Pt reported that he relapsed on alcohol and did not follow up with [facility]. Pt stated that he asked not to come back to [facility] because he had plan to go back home to [place], [place] with family after being detoxed.              Discharge Plan 1. Will you be returning to the same living situation after discharge?   Yes: No:      If no, what is your plan?    Pt moving to [place], [place] in order to live with family and plans to follow up at Mental Health in that county.        2. Would you like a referral for services when you are discharged? Yes:     If yes, for what services?  No:       Pt will go to walk in mental health clinic after arriving at [facility]        Summary and Recommendations (to be completed by the evaluator) Patient initially told ED staff at [facility] that he was suicidal w/ plan to jump in front of a car to kill self. Patient was asked by this clinician if he was having thoughts to kill self and he said "no." Patient also denied previous attempts to kills self. Patient has been at [facility] in August and was also d/c'ed on 10/14 from 300 hall at [facility]. Patient presents that he wants detox but it has been less than 10 days since he was discharged from [facility]. Patient reports that he drinks 18 beers per day with last drink being around 14:00 on 10/22. Patient also uses cocaine (powder) about 2x/W. Patient denies any HI or A/V hallucinations.    Recommendations for pt include: crisis stabilization, therapeutic mileiu, encourage group attendance and participation, librium taper for withdrawals, and development of comprehensive mental wellness/sobriety plan. Pt has walk in appt for mental health in [place], [place]. Pt was given part bus money and bus pass and is scheduled for d/c today.                      Signature:  Trula Slade, 08/27/2013 1:03 PM

## 2013-08-27 NOTE — BHH Group Notes (Signed)
[facility] LCSW Aftercare Discharge Planning Group Note   08/27/2013 10:25 AM  Participation Quality:  Appropriate   Mood/Affect:  Irritable  Depression Rating:  0  Anxiety Rating:  0  Thoughts of Suicide:  No Will you contract for safety?   NA  Current AVH:  No  Plan for Discharge/Comments:  Pt planning to go to [place], [place] in order to stay with family. Pt stated that he was upset that he had to return to [facility] after just d/cing last week. PT stated that there were no beds available at the shelter in [place] when he arrived. He is planning to d/c today, go to [place] to get his belongings and catch a bus to [place], [place]. Pt plans to follow up with o/p in this town.   Transportation Means: bus pass and part bus money needed   Supports: family supports identified by pt.   Smart, [employer]

## 2013-08-27 NOTE — BHH Suicide Risk Assessment (Signed)
BHH INPATIENT:  Family/Significant Other Suicide Prevention Education  Suicide Prevention Education:  Patient Refusal for Family/Significant Other Suicide Prevention Education: The patient Chad Sanchez has refused to provide written consent for family/significant other to be provided Family/Significant Other Suicide Prevention Education during admission and/or prior to discharge.  Physician notified.  SPE completed with pt and he was given SPI pamphlet, encouraged to ask questions, talk about concerns, and share with his support network.   Smart, Mekaylah Klich 08/27/2013, 11:29 AM

## 2013-08-27 NOTE — Tx Team (Signed)
Interdisciplinary Treatment Plan Update (Adult)  Date: 08/27/2013   Time Reviewed: 11:27 AM  Progress in Treatment:  Attending groups: Participating in groups:   Taking medication as prescribed: Yes  Tolerating medication: Yes  Family/Significant othe contact made: No. Pt refused family contact. SPE completed with pt.  Patient understands diagnosis: Yes, AEB seeking treatment for ETOH detox and mood stabilization.  Discussing patient identified problems/goals with staff: Yes  Medical problems stabilized or resolved: Yes  Denies suicidal/homicidal ideation: Yes during group/self report.  Patient has not harmed self or Others: Yes  New problem(s) identified:  Discharge Plan or Barriers: pt to go "back home to [place], [place]." Pt plans to follow up at mental health in this town.  Additional comments: N/A  Reason for Continuation of Hospitalization: d/c today  Estimated length of stay: d/c today  For review of initial/current patient goals, please see plan of care.  Attendees:  Patient:    Family:    Physician: Geoffery Lyons MD 08/27/2013 11:27 AM   Nursing: Tonny Branch PA Student  08/27/2013 11:27 AM   Clinical Social Worker [employer], LCSWA  08/27/2013 11:27 AM   Other:    Other:    Other:    Other:    Scribe for Treatment Team:  Trula Slade LCSWA 08/27/2013 11:27 AM

## 2013-08-27 NOTE — Progress Notes (Signed)
Pt d/c from hospital with bus passes. All items returned. D/C instructions given. Pt denies si and hi.

## 2013-08-27 NOTE — Progress Notes (Signed)
Adult Psychoeducational Group Note  Date:  08/27/2013 Time:  1:43 PM  Group Topic/Focus:  Therapeutic Activity   Participation Level:  Active  Participation Quality:  Appropriate  Affect:  Appropriate  Cognitive:  Appropriate  Insight: Appropriate  Engagement in Group:  Engaged  Modes of Intervention:  Activity and Socialization    Chad Sanchez 08/27/2013, 1:43 PM

## 2013-08-27 NOTE — BHH Suicide Risk Assessment (Signed)
Suicide Risk Assessment  Discharge Assessment     Demographic Factors:  Male  Mental Status Per Nursing Assessment::   On Admission:  NA  Current Mental Status by Physician: In full contact with reality. There are no suicidal ideas, plans or intent. His mood is euthymic, his affect is appropriate. There is no evidence of acute withdrawal. He is going to be going back to his hometown. He plans to re establish a relationship with his family    Loss Factors: NA  Historical Factors: NA  Risk Reduction Factors:   Sense of responsibility to family  Continued Clinical Symptoms:  Depression:   Comorbid alcohol abuse/dependence Alcohol/Substance Abuse/Dependencies  Cognitive Features That Contribute To Risk:  Polarized thinking Thought constriction (tunnel vision)    Suicide Risk:  Minimal: No identifiable suicidal ideation.  Patients presenting with no risk factors but with morbid ruminations; may be classified as minimal risk based on the severity of the depressive symptoms  Discharge Diagnoses:   AXIS I:  Alcohol dependence, Depressive Disorder NOS AXIS II:  Deferred AXIS III:   Past Medical History  Diagnosis Date  . Alcohol abuse   . Depression    AXIS IV:  occupational problems and other psychosocial or environmental problems AXIS V:  61-70 mild symptoms  Plan Of Care/Follow-up recommendations:  Activity:  as tolerated Diet:  regular Follow up outpatient basis/AA Is patient on multiple antipsychotic therapies at discharge:  No   Has Patient had three or more failed trials of antipsychotic monotherapy by history:  No  Recommended Plan for Multiple Antipsychotic Therapies: NA  Kinze Labo A 08/27/2013, 1:31 PM

## 2013-08-27 NOTE — Progress Notes (Signed)
[facility] Adult Case Management Discharge Plan :  Will you be returning to the same living situation after discharge: No. Going to live with family in [place], [place].  At discharge, do you have transportation home?:Yes,  bus pass and part bus $ Do you have the ability to pay for your medications:Yes,  mental health  Release of information consent forms completed and in the chart;  Patient's signature needed at discharge.  Patient to Follow up at: Follow-up Information   Follow up with [facility]. (Walk in between 9AM-5PM for hospital followup/assessment for therapy services. )    Contact information:   [address] [place], [place] [address] Phone: [phone] Fax: [phone]      Patient denies SI/HI:   Yes,  during group/self report    Safety Planning and Suicide Prevention discussed:  Yes,  PT refused family contact. SPE completed with pt and he was encouraged to share information with support network, ask questions, and talk about concerns.  Sanchez, Chad Kenneth 08/27/2013, 12:17 PM

## 2013-08-27 NOTE — BHH Suicide Risk Assessment (Signed)
Suicide Risk Assessment  Admission Assessment     Nursing information obtained from:  Patient Demographic factors:  Male;Low socioeconomic status;Unemployed Current Mental Status:  NA Loss Factors:  Financial problems / change in socioeconomic status Historical Factors:  Prior suicide attempts;Family history of mental illness or substance abuse Risk Reduction Factors:  Living with another person, especially a relative  CLINICAL FACTORS:   Depression:   Comorbid alcohol abuse/dependence Alcohol/Substance Abuse/Dependencies  COGNITIVE FEATURES THAT CONTRIBUTE TO RISK:  Polarized thinking Thought constriction (tunnel vision)    SUICIDE RISK:   Minimal: No identifiable suicidal ideation.  Patients presenting with no risk factors but with morbid ruminations; may be classified as minimal risk based on the severity of the depressive symptoms  PLAN OF CARE: Supportive approach/coping skills/relapse prevention                               Identify detox needs                               Reassess and address the co morbidites  I certify that inpatient services furnished can reasonably be expected to improve the patient's condition.  Munir Victorian A 08/27/2013, 12:47 PM

## 2013-09-01 NOTE — Progress Notes (Signed)
Patient Discharge Instructions:  After Visit Summary (AVS):   Faxed to:  09/01/13 Psychiatric Admission Assessment Note:   Faxed to:  09/01/13 Suicide Risk Assessment - Discharge Assessment:   Faxed to:  09/01/13 Faxed/Sent to the Next Level Care provider:  09/01/13 Faxed to [facility] @ [phone]  Jerelene Redden, 09/01/2013, 3:23 PM

## 2013-09-04 ENCOUNTER — Emergency Department (HOSPITAL_COMMUNITY)
Admission: EM | Admit: 2013-09-04 | Discharge: 2013-09-06 | Disposition: A | Discharge status: Home / Self Care | Payer: Self-pay | Attending: Emergency Medicine | Admitting: Emergency Medicine

## 2013-09-04 ENCOUNTER — Encounter (HOSPITAL_COMMUNITY): Payer: Self-pay | Admitting: Emergency Medicine

## 2013-09-04 DIAGNOSIS — R1110 Vomiting, unspecified: Secondary | ICD-10-CM | POA: Insufficient documentation

## 2013-09-04 DIAGNOSIS — F329 Major depressive disorder, single episode, unspecified: Secondary | ICD-10-CM | POA: Insufficient documentation

## 2013-09-04 DIAGNOSIS — R450 Nervousness: Secondary | ICD-10-CM | POA: Insufficient documentation

## 2013-09-04 DIAGNOSIS — F411 Generalized anxiety disorder: Secondary | ICD-10-CM | POA: Insufficient documentation

## 2013-09-04 DIAGNOSIS — R45851 Suicidal ideations: Secondary | ICD-10-CM | POA: Insufficient documentation

## 2013-09-04 DIAGNOSIS — R259 Unspecified abnormal involuntary movements: Secondary | ICD-10-CM | POA: Insufficient documentation

## 2013-09-04 DIAGNOSIS — F1020 Alcohol dependence, uncomplicated: Secondary | ICD-10-CM | POA: Insufficient documentation

## 2013-09-04 DIAGNOSIS — F17200 Nicotine dependence, unspecified, uncomplicated: Secondary | ICD-10-CM | POA: Insufficient documentation

## 2013-09-04 NOTE — ED Provider Notes (Signed)
CSN: [id number]     Arrival date & time 09/04/13  2339 History  This chart was scribed for non-physician practitioner Antony Madura, PA-C, working with Dagmar Hait, MD by Dorothey Baseman, ED Scribe. This patient was seen in room WTR5/WTR5 and the patient's care was started at 12:00 AM.    Chief Complaint  Patient presents with  . Medical Clearance   The history is provided by the patient. No language interpreter was used.   HPI Comments: Chad Sanchez is a 42 y.o. male with a history of depression brought in by [facility] who presents to the Emergency Department requesting medical clearance for suicidal ideation and alcohol detoxification. He states that he is currently experiencing some severe alcohol withdrawal symptoms, last drink was a 24 oz beer around 13 hours ago. He reports tremors and emesis, but denies seizures, associated with alcohol withdrawal. He reports some associated anxiety. Patient was seen here on 08/25/2013 for similar complaints and was prescribed anti-depressant medication, but has not taken his medication since then because it makes him "feel funny." Patient reports that he attempted to step in front of a car yesterday, but denies forming any plans otherwise to carry out his ideations. He denies homicidal ideation, abdominal pain, and nausea. Patient reports that he used cocaine about a week ago.   Past Medical History  Diagnosis Date  . Alcohol abuse   . Depression    History reviewed. No pertinent past surgical history. No family history on file. History  Substance Use Topics  . Smoking status: Current Every Day Smoker -- 0.25 packs/day  . Smokeless tobacco: Not on file  . Alcohol Use: 7.2 oz/week    12 Cans of beer per week     Comment: a case a day; pt told this writer 12 pk daily or a fifth of vodka    Review of Systems  Gastrointestinal: Positive for vomiting. Negative for nausea and abdominal pain.  Neurological: Positive for  tremors. Negative for seizures.  Psychiatric/Behavioral: Positive for suicidal ideas. The patient is nervous/anxious.   All other systems reviewed and are negative.    Allergies  Review of patient's allergies indicates no known allergies.  Home Medications  No current outpatient prescriptions on file.  Triage Vitals: BP 146/114  Pulse 96  Temp(Src) 98.2 F (36.8 C) (Oral)  Resp 18  Ht 6\' 2"  (1.88 m)  Wt 150 lb (68.04 kg)  BMI 19.25 kg/m2  Physical Exam  Nursing note and vitals reviewed. Constitutional: He is oriented to person, place, and time. He appears well-developed and well-nourished. No distress.  HENT:  Head: Normocephalic and atraumatic.  Mouth/Throat: Oropharynx is clear and moist. No oropharyngeal exudate.  Eyes: Conjunctivae and EOM are normal. Pupils are equal, round, and reactive to light. No scleral icterus.  Neck: Normal range of motion.  Cardiovascular: Normal rate, regular rhythm and normal heart sounds.   Pulmonary/Chest: Effort normal and breath sounds normal. No respiratory distress. He has no wheezes. He has no rales.  Abdominal: Soft. He exhibits no distension. There is no tenderness.  Musculoskeletal: Normal range of motion.  Neurological: He is alert and oriented to person, place, and time.  No tremors appreciated. Patient moves extremities without ataxia. GCS 15.  Skin: Skin is warm and dry. No rash noted. He is not diaphoretic. No erythema. No pallor.  Psychiatric: He has a normal mood and affect. His behavior is normal.    ED Course  Procedures (including critical care time)  DIAGNOSTIC STUDIES:  Oxygen Saturation is 97% on room air, normal by my interpretation.    COORDINATION OF CARE: 12:06 AM- Will order Ativan. Discussed treatment plan with patient at bedside and patient verbalized agreement.   Labs Review Labs Reviewed  COMPREHENSIVE METABOLIC PANEL - Abnormal; Notable for the following:    Glucose, Bld 66 (*)    Total Protein 8.6 (*)     Total Bilirubin 0.2 (*)    All other components within normal limits  ETHANOL - Abnormal; Notable for the following:    Alcohol, Ethyl (B) 181 (*)    All other components within normal limits  SALICYLATE LEVEL - Abnormal; Notable for the following:    Salicylate Lvl <2.0 (*)    All other components within normal limits  ACETAMINOPHEN LEVEL  CBC  URINE RAPID DRUG SCREEN (HOSP PERFORMED)   Imaging Review No results found.  EKG Interpretation   None       MDM   1. Alcohol dependence   2. Major depression    Patient with hx of major depression presents to ED today for worsening depression and suicidal thoughts. States he has been noncompliant with antidepressants and also attempted suicide yesterday by walking out into oncoming traffic. Patient endorses ETOH use this AM. Denies recent illicit drug use and HI. Patient with no pain complaints. No hx of seizures from ETOH detox. Patient medically cleared for evaluation by TTS. Patient expresses desire in residential ETOH tx facility.  I personally performed the services described in this documentation, which was scribed in my presence. The recorded information has been reviewed and is accurate.      Antony Madura, [place] 09/05/13 [phone]

## 2013-09-04 NOTE — ED Notes (Signed)
Pt here for SI and etoh detox. Pt states he attempted to step in front of a car earlier today. Pt brought to ED by [employer]. Pt tearful and tremulous in triage.

## 2013-09-05 ENCOUNTER — Encounter (HOSPITAL_COMMUNITY): Payer: Self-pay | Admitting: *Deleted

## 2013-09-05 DIAGNOSIS — F332 Major depressive disorder, recurrent severe without psychotic features: Secondary | ICD-10-CM

## 2013-09-05 DIAGNOSIS — F1010 Alcohol abuse, uncomplicated: Secondary | ICD-10-CM

## 2013-09-05 LAB — CBC
MCH: 27.8 pg (ref 26.0–34.0)
MCHC: 34.3 g/dL (ref 30.0–36.0)
MCV: 81.1 fL (ref 78.0–100.0)
Platelets: 294 10*3/uL (ref 150–400)
RBC: 5.03 MIL/uL (ref 4.22–5.81)
RDW: 14.7 % (ref 11.5–15.5)

## 2013-09-05 LAB — COMPREHENSIVE METABOLIC PANEL
AST: 25 U/L (ref 0–37)
Albumin: 4.4 g/dL (ref 3.5–5.2)
CO2: 27 mEq/L (ref 19–32)
Calcium: 10 mg/dL (ref 8.4–10.5)
Creatinine, Ser: 0.87 mg/dL (ref 0.50–1.35)
GFR calc Af Amer: >90 mL/min (ref >90)
GFR calc non Af Amer: >90 mL/min (ref >90)
Total Protein: 8.6 g/dL — ABNORMAL HIGH (ref 6.0–8.3)

## 2013-09-05 LAB — RAPID URINE DRUG SCREEN, HOSP PERFORMED
Amphetamines: NOT DETECTED
Benzodiazepines: NOT DETECTED
Opiates: NOT DETECTED

## 2013-09-05 LAB — SALICYLATE LEVEL: Salicylate Lvl: <2 mg/dL — ABNORMAL LOW (ref 2.8–20.0)

## 2013-09-05 LAB — ACETAMINOPHEN LEVEL: Acetaminophen (Tylenol), Serum: <15 ug/mL (ref 10–30)

## 2013-09-05 MED ORDER — ZOLPIDEM TARTRATE 5 MG PO TABS: 5.0000 mg | ORAL_TABLET | Freq: Every evening | ORAL | Status: DC | PRN

## 2013-09-05 MED ORDER — CHLORDIAZEPOXIDE HCL 25 MG PO CAPS: 25.0000 mg | ORAL_CAPSULE | Freq: Every day | ORAL | Status: DC

## 2013-09-05 MED ORDER — CHLORDIAZEPOXIDE HCL 25 MG PO CAPS: 25.0000 mg | ORAL_CAPSULE | ORAL | Status: DC

## 2013-09-05 MED ORDER — ALUM & MAG HYDROXIDE-SIMETH 200-200-20 MG/5ML PO SUSP: 30.0000 mL | ORAL | Status: DC | PRN

## 2013-09-05 MED ORDER — ONDANSETRON 4 MG PO TBDP: 4.0000 mg | ORAL_TABLET | Freq: Four times a day (QID) | ORAL | Status: DC | PRN

## 2013-09-05 MED ORDER — HYDROXYZINE HCL 25 MG PO TABS: 25.0000 mg | ORAL_TABLET | Freq: Four times a day (QID) | ORAL | Status: DC | PRN

## 2013-09-05 MED ORDER — LOPERAMIDE HCL 2 MG PO CAPS: 2.0000 mg | ORAL_CAPSULE | ORAL | Status: DC | PRN

## 2013-09-05 MED ORDER — LORAZEPAM 1 MG PO TABS
0.0000 mg | ORAL_TABLET | Freq: Four times a day (QID) | ORAL | Status: DC
  Administered 2013-09-05 (×2): 1 mg via ORAL
  Filled 2013-09-05: qty 1
  Filled 2013-09-05: qty 4

## 2013-09-05 MED ORDER — ONDANSETRON HCL 4 MG PO TABS: 4.0000 mg | ORAL_TABLET | Freq: Three times a day (TID) | ORAL | Status: DC | PRN

## 2013-09-05 MED ORDER — THIAMINE HCL 100 MG/ML IJ SOLN: 100.0000 mg | Freq: Every day | INTRAMUSCULAR | Status: DC

## 2013-09-05 MED ORDER — CHLORDIAZEPOXIDE HCL 25 MG PO CAPS
25.0000 mg | ORAL_CAPSULE | Freq: Once | ORAL | Status: DC
  Filled 2013-09-05: qty 1

## 2013-09-05 MED ORDER — NICOTINE 21 MG/24HR TD PT24
21.0000 mg | MEDICATED_PATCH | Freq: Every day | TRANSDERMAL | Status: DC
  Filled 2013-09-05: qty 1

## 2013-09-05 MED ORDER — VITAMIN B-1 100 MG PO TABS
100.0000 mg | ORAL_TABLET | Freq: Every day | ORAL | Status: DC
  Administered 2013-09-05: 11:00:00 100 mg via ORAL
  Filled 2013-09-05: qty 1

## 2013-09-05 MED ORDER — CHLORDIAZEPOXIDE HCL 25 MG PO CAPS: 25.0000 mg | ORAL_CAPSULE | Freq: Four times a day (QID) | ORAL | Status: DC | PRN

## 2013-09-05 MED ORDER — LORAZEPAM 1 MG PO TABS: 0.0000 mg | ORAL_TABLET | Freq: Two times a day (BID) | ORAL | Status: DC

## 2013-09-05 MED ORDER — CHLORDIAZEPOXIDE HCL 25 MG PO CAPS
25.0000 mg | ORAL_CAPSULE | Freq: Four times a day (QID) | ORAL | Status: DC
  Administered 2013-09-05 (×3): 25 mg via ORAL
  Filled 2013-09-05 (×2): qty 1

## 2013-09-05 MED ORDER — ACETAMINOPHEN 325 MG PO TABS: 650.0000 mg | ORAL_TABLET | ORAL | Status: DC | PRN

## 2013-09-05 MED ORDER — CHLORDIAZEPOXIDE HCL 25 MG PO CAPS: 25.0000 mg | ORAL_CAPSULE | Freq: Three times a day (TID) | ORAL | Status: DC

## 2013-09-05 NOTE — ED Provider Notes (Signed)
Medical screening examination/treatment/procedure(s) were performed by non-physician practitioner and as supervising physician I was immediately available for consultation/collaboration.  EKG Interpretation   None         Dagmar Hait, MD 09/05/13 [phone]

## 2013-09-05 NOTE — ED Notes (Signed)
CIWA deferred-pt sleeping 

## 2013-09-05 NOTE — Discharge Summary (Signed)
Physician Discharge Summary Note  Patient:  Chad Sanchez is an 42 y.o., male MRN:  [id number] DOB:  08/22/1971 Patient phone:  [phone] (home)  Patient address:   [address] [place] [address],   Date of Admission:  08/26/2013 Date of Discharge: 08/27/2013  Reason for Admission:  42 Y/O male who was discharged from our unit on 08/20/2013. He was going to [facility] in [place]. He made it there but they did not have a bed. He went to the [facility]. He continued to drink and has used some cocaine. He decided he was better off making to his home town and mending the relationship with his family. He came to the ED wanting " a little something " for his tremors. He states he did not think he needed to be admitted  Discharge Diagnoses: Active Problems:   * No active hospital problems. *  Review of Systems  Constitutional: Negative.   HENT: Negative.   Eyes: Negative.   Respiratory: Negative.   Cardiovascular: Negative.   Gastrointestinal: Negative.   Genitourinary: Negative.   Musculoskeletal: Negative.   Skin: Negative.   Neurological: Negative.   Endo/Heme/Allergies: Negative.   Psychiatric/Behavioral: Positive for substance abuse. The patient is nervous/anxious.     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 Alcohol abuse   . Depression    AXIS IV:  other psychosocial or environmental problems AXIS V:  61-70 mild symptoms  Level of Care:  OP  Hospital Course:  He was admitted and started in intensive individual and group psychotherapy.  He was  assessed for detox needs. He was given some Ativan on a PRN basis. He did not require much. He expressed readiness to be D/C. He was in full contact with reality. There were no active S/S of withdrawal. He endorsed no suicidal ideas, plans or intent. He was planning to go back to his hometown in [place]. He felt ready to make amends and re establish a relationship with his family.  Consults:  None  Significant Diagnostic Studies:  As per the chart  Discharge Vitals:   Blood pressure 143/112, pulse 101, temperature 98.4 F (36.9 C), temperature source Oral, resp. rate 22, height 6\' 2"  (1.88 m), weight 68.04 kg (150 lb). Body mass index is 19.25 kg/(m^2). Lab Results:   No results found for this or any previous visit (from the past 72 hour(s)).  Physical Findings: AIMS: Facial and Oral Movements Muscles of Facial Expression: None, normal Lips and Perioral Area: None, normal Jaw: None, normal Tongue: None, normal,Extremity Movements Upper (arms, wrists, hands, fingers): None, normal Lower (legs, knees, ankles, toes): None, normal, Trunk Movements Neck, shoulders, hips: None, normal, Overall Severity Severity of abnormal movements (highest score from questions above): None, normal Incapacitation due to abnormal movements: None, normal Patient's awareness of abnormal movements (rate only patient's report): No Awareness, Dental Status Current problems with teeth and/or dentures?: No Does patient usually wear dentures?: No  CIWA:  CIWA-Ar Total: 0 COWS:  COWS Total Score: 2  Psychiatric Specialty Exam: See Psychiatric Specialty Exam and Suicide Risk Assessment completed by Attending Physician prior to discharge.  Discharge destination:  Home  Is patient on multiple antipsychotic therapies at discharge:  No   Has Patient had three or more failed trials of antipsychotic monotherapy by history:  No  Recommended Plan for Multiple Antipsychotic Therapies: NA  Discharge Orders    Future Orders Complete By Expires   Diet - low sodium heart healthy  As directed    Discharge instructions  As directed    Comments:     You have not been prescribed any medications. Please follow up as planned. Please avoid all alcohol and drugs. If you feel your symptoms returning please report to the nearest Emergency Department.   Increase activity slowly  As directed        Medication List    Notice   Cannot display patient medications because the patient has not yet arrived.         Follow-up Information   Follow up with [facility]. (Walk in between 9AM-5PM for hospital followup/assessment for therapy services. )    Contact information:   [address] [place], [place] [address] Phone: [phone] Fax: [phone]      Follow-up recommendations:  Activity:  as tolerated Diet:  regular  Comments:  Continue to work your relapse prevention plan. Go to AA. Seek out counseling to help with the process of reintegrating with your family  Total Discharge Time:  Greater than 30 minutes.  Signed: Samreen Seltzer A 09/05/2013, 8:24 AM

## 2013-09-05 NOTE — ED Notes (Signed)
Up to the phone 

## 2013-09-05 NOTE — ED Notes (Signed)
CBD 98 per mHt-results not transmitting

## 2013-09-05 NOTE — ED Notes (Signed)
Pt states for years he's been drinking a case of beer or more to the point he either passes out or blacks out. Pt reports he's been to [facility] in [place] from 2009-2011 and was sober one year after that and began drinking again after "a fuss with my girlfriend". Pt reports earlier today he had thoughts of hurting himself by walking out into traffic and that a few years ago he tried to kill himself by walking out into traffic. He said he went to [facility] and was started on Prozac a week ago but doesn't know if he's supposed to go back or not though he agrees he needs to be on medication and maybe give it more time to work as well. He said he doesn't like how Prozac makes him feel which is weird and like he's not himself. We discussed antidepressants need a month or so to get in your system and begin to work as well. Pt also wants to go to a 30 day or so SA treatment center after discharge because he feels if he doesn't then he will drink again on discharge. He contracts for safety in hospital. He admits to using cocaine at times if it's available but ETOH is his drug of choice.   This is being charted after downtime from 0030-0230 and daylight savings in which we turned the clocks back an hour at 0200.   Please see written charting done during downtime in his chart.

## 2013-09-05 NOTE — BH Assessment (Signed)
Tele Assessment Note   Chad Sanchez is an 42 y.o. male who presents voluntarily to [facility] with the chief complaint of suicidal ideations with active plan to walk into traffic and alcohol detoxification. Patient reported that he is depressed and that he consumes excessive amounts of alcohol as a maladaptive coping mechanism. Patient stated that he binge drinks and typically consumes a fifth of vodka daily or a 12 pack case of beer. Patient reported that he often thinks "I don't want to exist" which subsequently urges him to commit suicide evidenced by his desire to walk out in traffic. Patient verbalized feelings of hopelessness and guilt as he stated "I can't do anything right!". Patient reported that he was previously involved within the [facility] in [facility] and that he did graduate successfully from the program. Patient was unable to report his last time period of sobriety. Patient states that he currently experiences insomnia, lack of appetite, and withdrawal shakes when he attempts to stop his alcohol consumption. Patient did report that he is currently prescribed Prozac from a psychiatrist at [facility] although he is noncompliant with his medications for unspecified reasons. Patient is unable to contract for safety at this time. Patient denies HI/AVH.    Axis I: Major Depression, single episode and Alcohol Use Disorder  Axis II: Deferred Axis III:  Past Medical History  Diagnosis Date  . Alcohol abuse   . Depression    Axis IV: other psychosocial or environmental problems, problems related to social environment and problems with primary support group Axis V: 21-30 behavior considerably influenced by delusions or hallucinations OR serious impairment in judgment, communication OR inability to function in almost all areas  Past Medical History:  Past Medical History  Diagnosis Date  . Alcohol abuse   . Depression     History reviewed. No pertinent past surgical history.  Family  History: No family history on file.  Social History:  reports that he has been smoking.  He does not have any smokeless tobacco history on file. He reports that he drinks about 7.2 ounces of alcohol per week. He reports that he uses illicit drugs ("Crack" cocaine and Cocaine).  Additional Social History:     CIWA: CIWA-Ar BP: 146/114 mmHg Pulse Rate: 96 Nausea and Vomiting: mild nausea with no vomiting Tactile Disturbances: very mild itching, pins and needles, burning or numbness Tremor: two Auditory Disturbances: not present Paroxysmal Sweats: barely perceptible sweating, palms moist Visual Disturbances: not present Anxiety: two Headache, Fullness in Head: very mild Agitation: somewhat more than normal activity Orientation and Clouding of Sensorium: oriented and can do serial additions CIWA-Ar Total: 9 COWS:    Allergies: No Known Allergies  Home Medications:  (Not in a hospital admission)  OB/GYN Status:  No LMP for male patient.  General Assessment Data Location of Assessment: [facility] Services Is this a Tele or Face-to-Face Assessment?: Tele Assessment Is this an Initial Assessment or a Re-assessment for this encounter?: Initial Assessment Living Arrangements: Alone Can pt return to current living arrangement?: Yes Admission Status: Voluntary Is patient capable of signing voluntary admission?: Yes Transfer from: Home Referral Source: Self/Family/Friend     [facility] Crisis Care Plan Living Arrangements: Alone Name of Psychiatrist: [facility] Name of Therapist: Unknown  Education Status Is patient currently in school?: No  Risk to self Suicidal Ideation: Yes-Currently Present Suicidal Intent: Yes-Currently Present Is patient at risk for suicide?: Yes Suicidal Plan?: Yes-Currently Present Specify Current Suicidal Plan: To walk in traffic Access to Means: Yes Specify  Access to Suicidal Means: Access to streets What has been your use of drugs/alcohol within the  last 12 months?: Crack cocaine Previous Attempts/Gestures: Yes How many times?: 1 Other Self Harm Risks: Substance abuse Triggers for Past Attempts: Unpredictable Intentional Self Injurious Behavior: None Comment - Self Injurious Behavior: N/A Family Suicide History: Unknown Recent stressful life event(s): Other (Comment) (Relapse into substance abuse) Persecutory voices/beliefs?: No Depression: Yes Depression Symptoms: Insomnia;Isolating;Guilt;Loss of interest in usual pleasures;Feeling worthless/self pity Substance abuse history and/or treatment for substance abuse?: Yes Suicide prevention information given to non-admitted patients: Not applicable  Risk to Others Homicidal Ideation: No Thoughts of Harm to Others: No Current Homicidal Intent: No Current Homicidal Plan: No Access to Homicidal Means: No Identified Victim: None History of harm to others?: No Assessment of Violence: None Noted Violent Behavior Description: None Does patient have access to weapons?: No Criminal Charges Pending?: No Describe Pending Criminal Charges: Pt denies Does patient have a court date: No  Psychosis Hallucinations: None noted Delusions: None noted  Mental Status Report Appear/Hygiene: Disheveled Eye Contact: Poor Motor Activity: Freedom of movement Speech: Logical/coherent Level of Consciousness: Alert Mood: Anxious Affect: Anxious Anxiety Level: Minimal Panic attack frequency: N/A Most recent panic attack: N/A Thought Processes: Coherent;Relevant Judgement: Impaired Orientation: Person;Place;Time;Situation Obsessive Compulsive Thoughts/Behaviors: None  Cognitive Functioning Concentration: Decreased Memory: Recent Intact;Remote Intact IQ: Average Insight: Fair Impulse Control: Poor Appetite: Poor Weight Loss: 15 Weight Gain: 0 Sleep: Decreased Total Hours of Sleep: 4 Vegetative Symptoms: None  ADLScreening [facility] Assessment Services) Patient's cognitive ability adequate  to safely complete daily activities?: Yes Patient able to express need for assistance with ADLs?: Yes Independently performs ADLs?: Yes (appropriate for developmental age)  Prior Inpatient Therapy Prior Inpatient Therapy: Yes Prior Therapy Dates: Unknown Prior Therapy Facilty/Provider(s): [facility], [facility] Reason for Treatment: SA  Prior Outpatient Therapy Prior Outpatient Therapy: Yes Prior Therapy Dates: Current Prior Therapy Facilty/Provider(s): [facility] Reason for Treatment: SI/SA  ADL Screening (condition at time of admission) Patient's cognitive ability adequate to safely complete daily activities?: Yes Is the patient deaf or have difficulty hearing?: No Does the patient have difficulty seeing, even when wearing glasses/contacts?: No Does the patient have difficulty concentrating, remembering, or making decisions?: No Patient able to express need for assistance with ADLs?: Yes Does the patient have difficulty dressing or bathing?: No Independently performs ADLs?: Yes (appropriate for developmental age) Does the patient have difficulty walking or climbing stairs?: No Weakness of Legs: None Weakness of Arms/Hands: None  Home Assistive Devices/Equipment Home Assistive Devices/Equipment: None  Therapy Consults (therapy consults require a physician order) PT Evaluation Needed: No OT Evalulation Needed: No SLP Evaluation Needed: No Abuse/Neglect Assessment (Assessment to be complete while patient is alone) Physical Abuse: Denies Verbal Abuse: Denies Sexual Abuse: Denies Exploitation of patient/patient's resources: Denies Self-Neglect: Denies Values / Beliefs Cultural Requests During Hospitalization: None Spiritual Requests During Hospitalization: None Consults Spiritual Care Consult Needed: No Social Work Consult Needed: No      Additional Information 1:1 In Past 12 Months?: No CIRT Risk: No Elopement Risk: No Does patient have medical clearance?: Yes      Disposition: Inpatient treatment recommended by assessor due to inability to contract for safety.   Disposition Initial Assessment Completed for this Encounter: Yes Disposition of Patient: Inpatient treatment program Type of inpatient treatment program: Adult  Haskel Khan 09/05/2013 1:30 AM

## 2013-09-05 NOTE — Progress Notes (Signed)
Underwriter attempted to find placement at the following facilities: 1)[facility]-no beds 2)[facility]-declined 3)[facility]-declined 4)[facility] 5)[facility]

## 2013-09-05 NOTE — Consult Note (Signed)
[facility] Face-to-Face Psychiatry Consult   Reason for Consult:  SI with plan to walk in traffic Referring Physician:  EDP  Chad Sanchez is an 42 y.o. male.  Assessment: AXIS I:  Alcohol Abuse and Major Depression, Recurrent severe AXIS II:  Deferred AXIS III:   Past Medical History  Diagnosis Date  . Alcohol abuse   . Depression    AXIS IV:  other psychosocial or environmental problems AXIS V:  21-30 behavior considerably influenced by delusions or hallucinations OR serious impairment in judgment, communication OR inability to function in almost all areas  Plan:  Recommend psychiatric Inpatient admission when medically cleared.  Subjective:   Chad Sanchez is a 42 y.o. male patient admitted with chief complaint of suicidal ideations with active plan to walk into traffic and alcohol detoxification.  HPI:  Patient reported that he is depressed and that he consumes excessive amounts of alcohol as a maladaptive coping mechanism. Patient stated that he binge drinks and typically consumes a fifth of vodka daily or a 12 pack case of beer. Patient reported that he often thinks "I don't want to exist" which subsequently urges him to commit suicide evidenced by his desire to walk out in traffic. Patient verbalized feelings of hopelessness and guilt as he stated "I can't do anything right!". Patient reported that he was previously involved within the [place] program in [facility] and that he did graduate successfully from the program. Patient was unable to report his last time period of sobriety. Patient states that he currently experiences insomnia, lack of appetite, and withdrawal shakes when he attempts to stop his alcohol consumption. Patient did report that he is currently prescribed Prozac from a psychiatrist at [facility] although he is noncompliant with his medications for unspecified reasons. Patient is unable to contract for safety at this time. Patient denies HI/AVH.  Marland Kitchen  HPI Elements:    Location:  depression. Quality:  severe. Severity:  severe. Timing:  1-2 weeks. Duration:  1-2 weeks. Context:  no specific stressor.  Past Psychiatric History: Past Medical History  Diagnosis Date  . Alcohol abuse   . Depression     reports that he has been smoking.  He does not have any smokeless tobacco history on file. He reports that he drinks about 7.2 ounces of alcohol per week. He reports that he uses illicit drugs ("Crack" cocaine and Cocaine). No family history on file. Family History Substance Abuse: No Family Supports: Yes, List: Living Arrangements: Alone Can pt return to current living arrangement?: Yes Abuse/Neglect [facility]) Physical Abuse: Denies Verbal Abuse: Denies Sexual Abuse: Denies Allergies:  No Known Allergies  ACT Assessment Complete:  Yes:    Educational Status    Risk to Self: Risk to self Suicidal Ideation: Yes-Currently Present Suicidal Intent: Yes-Currently Present Is patient at risk for suicide?: Yes Suicidal Plan?: Yes-Currently Present Specify Current Suicidal Plan: To walk in traffic Access to Means: Yes Specify Access to Suicidal Means: Access to streets What has been your use of drugs/alcohol within the last 12 months?: Crack cocaine Previous Attempts/Gestures: Yes How many times?: 1 Other Self Harm Risks: Substance abuse Triggers for Past Attempts: Unpredictable Intentional Self Injurious Behavior: None Comment - Self Injurious Behavior: N/A Family Suicide History: Unknown Recent stressful life event(s): Other (Comment) (Relapse into substance abuse) Persecutory voices/beliefs?: No Depression: Yes Depression Symptoms: Insomnia;Isolating;Guilt;Loss of interest in usual pleasures;Feeling worthless/self pity Substance abuse history and/or treatment for substance abuse?: Yes Suicide prevention information given to non-admitted patients: Not applicable  Risk  to Others: Risk to Others Homicidal Ideation: No Thoughts of Harm to Others:  No Current Homicidal Intent: No Current Homicidal Plan: No Access to Homicidal Means: No Identified Victim: None History of harm to others?: No Assessment of Violence: None Noted Violent Behavior Description: None Does patient have access to weapons?: No Criminal Charges Pending?: No Describe Pending Criminal Charges: Pt denies Does patient have a court date: No  Abuse: Abuse/Neglect Assessment (Assessment to be complete while patient is alone) Physical Abuse: Denies Verbal Abuse: Denies Sexual Abuse: Denies Exploitation of patient/patient's resources: Denies Self-Neglect: Denies  Prior Inpatient Therapy: Prior Inpatient Therapy Prior Inpatient Therapy: Yes Prior Therapy Dates: Unknown Prior Therapy Facilty/Provider(s): [facility], [facility] Reason for Treatment: SA  Prior Outpatient Therapy: Prior Outpatient Therapy Prior Outpatient Therapy: Yes Prior Therapy Dates: Current Prior Therapy Facilty/Provider(s): [facility] Reason for Treatment: SI/SA  Additional Information: Additional Information 1:1 In Past 12 Months?: No CIRT Risk: No Elopement Risk: No Does patient have medical clearance?: Yes                  Objective: Blood pressure 120/86, pulse 86, temperature 98.7 F (37.1 C), temperature source Oral, resp. rate 17, height 6\' 2"  (1.88 m), weight 68.04 kg (150 lb), SpO2 95.00%.Body mass index is 19.25 kg/(m^2). Results for orders placed during the hospital encounter of 09/04/13 (from the past 72 hour(s))  ACETAMINOPHEN LEVEL     Status: None   Collection Time    09/04/13 11:52 PM      Result Value Range   Acetaminophen (Tylenol), Serum <15.0  10 - 30 ug/mL   Comment:            THERAPEUTIC CONCENTRATIONS VARY     SIGNIFICANTLY. A RANGE OF 10-30     ug/mL MAY BE AN EFFECTIVE     CONCENTRATION FOR MANY PATIENTS.     HOWEVER, SOME ARE BEST TREATED     AT CONCENTRATIONS OUTSIDE THIS     RANGE.     ACETAMINOPHEN CONCENTRATIONS     >150 ug/mL AT 4 HOURS AFTER      INGESTION AND >50 ug/mL AT 12     HOURS AFTER INGESTION ARE     OFTEN ASSOCIATED WITH TOXIC     REACTIONS.  CBC     Status: None   Collection Time    09/04/13 11:52 PM      Result Value Range   WBC 5.9  4.0 - 10.5 K/uL   RBC 5.03  4.22 - 5.81 MIL/uL   Hemoglobin 14.0  13.0 - 17.0 g/dL   HCT 16.1  09.6 - 04.5 %   MCV 81.1  78.0 - 100.0 fL   MCH 27.8  26.0 - 34.0 pg   MCHC 34.3  30.0 - 36.0 g/dL   RDW 40.9  81.1 - 91.4 %   Platelets 294  150 - 400 K/uL  COMPREHENSIVE METABOLIC PANEL     Status: Abnormal   Collection Time    09/04/13 11:52 PM      Result Value Range   Sodium 138  135 - 145 mEq/L   Potassium 4.0  3.5 - 5.1 mEq/L   Chloride 102  96 - 112 mEq/L   CO2 27  19 - 32 mEq/L   Glucose, Bld 66 (*) 70 - 99 mg/dL   BUN 6  6 - 23 mg/dL   Creatinine, Ser 7.82  0.50 - 1.35 mg/dL   Calcium 95.6  8.4 - 21.3 mg/dL   Total Protein 8.6 (*)  6.0 - 8.3 g/dL   Albumin 4.4  3.5 - 5.2 g/dL   AST 25  0 - 37 U/L   ALT 20  0 - 53 U/L   Alkaline Phosphatase 78  39 - 117 U/L   Total Bilirubin 0.2 (*) 0.3 - 1.2 mg/dL   GFR calc non Af Amer >90  >90 mL/min   GFR calc Af Amer >90  >90 mL/min   Comment: (NOTE)     The eGFR has been calculated using the CKD EPI equation.     This calculation has not been validated in all clinical situations.     eGFR's persistently <90 mL/min signify possible Chronic Kidney     Disease.  ETHANOL     Status: Abnormal   Collection Time    09/04/13 11:52 PM      Result Value Range   Alcohol, Ethyl (B) 181 (*) 0 - 11 mg/dL   Comment:            LOWEST DETECTABLE LIMIT FOR     SERUM ALCOHOL IS 11 mg/dL     FOR MEDICAL PURPOSES ONLY  SALICYLATE LEVEL     Status: Abnormal   Collection Time    09/04/13 11:52 PM      Result Value Range   Salicylate Lvl <2.0 (*) 2.8 - 20.0 mg/dL  URINE RAPID DRUG SCREEN (HOSP PERFORMED)     Status: None   Collection Time    09/04/13 11:58 PM      Result Value Range   Opiates NONE DETECTED  NONE DETECTED   Cocaine  NONE DETECTED  NONE DETECTED   Benzodiazepines NONE DETECTED  NONE DETECTED   Amphetamines NONE DETECTED  NONE DETECTED   Tetrahydrocannabinol NONE DETECTED  NONE DETECTED   Barbiturates NONE DETECTED  NONE DETECTED   Comment:            DRUG SCREEN FOR MEDICAL PURPOSES     ONLY.  IF CONFIRMATION IS NEEDED     FOR ANY PURPOSE, NOTIFY LAB     WITHIN 5 DAYS.                LOWEST DETECTABLE LIMITS     FOR URINE DRUG SCREEN     Drug Class       Cutoff (ng/mL)     Amphetamine      1000     Barbiturate      200     Benzodiazepine   200     Tricyclics       300     Opiates          300     Cocaine          300     THC              50   Labs are reviewed and are pertinent for BAL 181.  Current Facility-Administered Medications  Medication Dose Route Frequency Provider Last Rate Last Dose  . acetaminophen (TYLENOL) tablet 650 mg  650 mg Oral Q4H PRN Antony Madura, PA-C      . alum & mag hydroxide-simeth (MAALOX/MYLANTA) 200-200-20 MG/5ML suspension 30 mL  30 mL Oral PRN Antony Madura, PA-C      . LORazepam (ATIVAN) tablet 0-4 mg  0-4 mg Oral Q6H Antony Madura, PA-C   1 mg at 09/05/13 0104   Followed by  . [START ON 09/07/2013] LORazepam (ATIVAN) tablet 0-4 mg  0-4 mg Oral Q12H Antony Madura, PA-C      .  nicotine (NICODERM CQ - dosed in mg/24 hours) patch 21 mg  21 mg Transdermal Daily Antony Madura, PA-C      . ondansetron [facility]) tablet 4 mg  4 mg Oral Q8H PRN Antony Madura, PA-C      . thiamine (VITAMIN B-1) tablet 100 mg  100 mg Oral Daily Antony Madura, PA-C   100 mg at 09/05/13 1030   Or  . thiamine (B-1) injection 100 mg  100 mg Intravenous Daily Antony Madura, PA-C      . zolpidem (AMBIEN) tablet 5 mg  5 mg Oral QHS PRN Antony Madura, PA-C       No current outpatient prescriptions on file.    Psychiatric Specialty Exam:     Blood pressure 120/86, pulse 86, temperature 98.7 F (37.1 C), temperature source Oral, resp. rate 17, height 6\' 2"  (1.88 m), weight 68.04 kg (150 lb), SpO2 95.00%.Body  mass index is 19.25 kg/(m^2).  General Appearance: Disheveled  Eye Contact::  Minimal  Speech:  Slow  Volume:  Decreased  Mood:  Depressed  Affect:  Congruent and Depressed  Thought Process:  Coherent and Goal Directed  Orientation:  Full (Time, Place, and Person)  Thought Content:  Hallucinations: None  Suicidal Thoughts:  Yes.  with intent/plan to walk in front of traffic  Homicidal Thoughts:  No  Memory:  Immediate;   Fair Recent;   Fair Remote;   Fair  Judgement:  Poor  Insight:  Lacking  Psychomotor Activity:  Decreased  Concentration:  Poor  Recall:  Fair  Akathisia:  Negative  Handed:  Right  AIMS (if indicated):     Assets:  Communication Skills Desire for Improvement  Sleep:      Treatment Plan Summary: Daily contact with patient to assess and evaluate symptoms and progress in treatment Medication management will refer for inpt treatment. pt was recently d/c'ed from [facility] last month. No h/o withdrawal sz's or DTs.   Ancil Linsey 09/05/2013 12:53 PM

## 2013-09-06 ENCOUNTER — Inpatient Hospital Stay (HOSPITAL_COMMUNITY): Admission: AD | Admit: 2013-09-06 | Payer: Self-pay | Source: Intra-hospital | Admitting: Psychiatry

## 2013-09-06 LAB — GLUCOSE, CAPILLARY: Glucose-Capillary: 98 mg/dL (ref 70–99)

## 2013-09-06 NOTE — Consult Note (Signed)
[facility] Face-to-Face Psychiatry Consult   Reason for Consult:  reeval Referring Physician:  EDP  Chad Sanchez is an 42 y.o. male.  Assessment: AXIS I:  Alcohol Abuse and Major Depression, Recurrent severe AXIS II:  Deferred AXIS III:   Past Medical History  Diagnosis Date  . Alcohol abuse   . Depression    AXIS IV:  other psychosocial or environmental problems AXIS V: GAF current 60  Plan:  Pt will be discharged, and f/u at [facility].  Subjective:   Chad Sanchez is a 42 y.o. male patient. Pt reports feeling much better today. He reports only feeling depressed when he drinks alcohol. He denies current w/d sxs. Mood is much better now. Sleep/appetite good. He denies current SI/HI/AVH. He denies a history of suicide attempts. He wants to f/u at [facility].      Objective: Blood pressure 137/95, pulse 91, temperature 98.6 F (37 C), temperature source Oral, resp. rate 16, height 6\' 2"  (1.88 m), weight 68.04 kg (150 lb), SpO2 98.00%.Body mass index is 19.25 kg/(m^2). Results for orders placed during the hospital encounter of 09/04/13 (from the past 72 hour(s))  ACETAMINOPHEN LEVEL     Status: None   Collection Time    09/04/13 11:52 PM      Result Value Range   Acetaminophen (Tylenol), Serum <15.0  10 - 30 ug/mL   Comment:            THERAPEUTIC CONCENTRATIONS VARY     SIGNIFICANTLY. A RANGE OF 10-30     ug/mL MAY BE AN EFFECTIVE     CONCENTRATION FOR MANY PATIENTS.     HOWEVER, SOME ARE BEST TREATED     AT CONCENTRATIONS OUTSIDE THIS     RANGE.     ACETAMINOPHEN CONCENTRATIONS     >150 ug/mL AT 4 HOURS AFTER     INGESTION AND >50 ug/mL AT 12     HOURS AFTER INGESTION ARE     OFTEN ASSOCIATED WITH TOXIC     REACTIONS.  CBC     Status: None   Collection Time    09/04/13 11:52 PM      Result Value Range   WBC 5.9  4.0 - 10.5 K/uL   RBC 5.03  4.22 - 5.81 MIL/uL   Hemoglobin 14.0  13.0 - 17.0 g/dL   HCT 45.4  09.8 - 11.9 %   MCV 81.1  78.0  - 100.0 fL   MCH 27.8  26.0 - 34.0 pg   MCHC 34.3  30.0 - 36.0 g/dL   RDW 14.7  82.9 - 56.2 %   Platelets 294  150 - 400 K/uL  COMPREHENSIVE METABOLIC PANEL     Status: Abnormal   Collection Time    09/04/13 11:52 PM      Result Value Range   Sodium 138  135 - 145 mEq/L   Potassium 4.0  3.5 - 5.1 mEq/L   Chloride 102  96 - 112 mEq/L   CO2 27  19 - 32 mEq/L   Glucose, Bld 66 (*) 70 - 99 mg/dL   BUN 6  6 - 23 mg/dL   Creatinine, Ser 1.30  0.50 - 1.35 mg/dL   Calcium 86.5  8.4 - 78.4 mg/dL   Total Protein 8.6 (*) 6.0 - 8.3 g/dL   Albumin 4.4  3.5 - 5.2 g/dL   AST 25  0 - 37 U/L   ALT 20  0 - 53 U/L   Alkaline Phosphatase  78  39 - 117 U/L   Total Bilirubin 0.2 (*) 0.3 - 1.2 mg/dL   GFR calc non Af Amer >90  >90 mL/min   GFR calc Af Amer >90  >90 mL/min   Comment: (NOTE)     The eGFR has been calculated using the CKD EPI equation.     This calculation has not been validated in all clinical situations.     eGFR's persistently <90 mL/min signify possible Chronic Kidney     Disease.  ETHANOL     Status: Abnormal   Collection Time    09/04/13 11:52 PM      Result Value Range   Alcohol, Ethyl (B) 181 (*) 0 - 11 mg/dL   Comment:            LOWEST DETECTABLE LIMIT FOR     SERUM ALCOHOL IS 11 mg/dL     FOR MEDICAL PURPOSES ONLY  SALICYLATE LEVEL     Status: Abnormal   Collection Time    09/04/13 11:52 PM      Result Value Range   Salicylate Lvl <2.0 (*) 2.8 - 20.0 mg/dL  URINE RAPID DRUG SCREEN (HOSP PERFORMED)     Status: None   Collection Time    09/04/13 11:58 PM      Result Value Range   Opiates NONE DETECTED  NONE DETECTED   Cocaine NONE DETECTED  NONE DETECTED   Benzodiazepines NONE DETECTED  NONE DETECTED   Amphetamines NONE DETECTED  NONE DETECTED   Tetrahydrocannabinol NONE DETECTED  NONE DETECTED   Barbiturates NONE DETECTED  NONE DETECTED   Comment:            DRUG SCREEN FOR MEDICAL PURPOSES     ONLY.  IF CONFIRMATION IS NEEDED     FOR ANY PURPOSE, NOTIFY LAB      WITHIN 5 DAYS.                LOWEST DETECTABLE LIMITS     FOR URINE DRUG SCREEN     Drug Class       Cutoff (ng/mL)     Amphetamine      1000     Barbiturate      200     Benzodiazepine   200     Tricyclics       300     Opiates          300     Cocaine          300     THC              50  GLUCOSE, CAPILLARY     Status: None   Collection Time    09/05/13  8:29 AM      Result Value Range   Glucose-Capillary 98  70 - 99 mg/dL   Comment 1 Documented in Chart       Current Facility-Administered Medications  Medication Dose Route Frequency Provider Last Rate Last Dose  . acetaminophen (TYLENOL) tablet 650 mg  650 mg Oral Q4H PRN Antony Madura, PA-C      . alum & mag hydroxide-simeth (MAALOX/MYLANTA) 200-200-20 MG/5ML suspension 30 mL  30 mL Oral PRN Antony Madura, PA-C      . chlordiazePOXIDE (LIBRIUM) capsule 25 mg  25 mg Oral Q6H PRN Shuvon Rankin, NP      . chlordiazePOXIDE (LIBRIUM) capsule 25 mg  25 mg Oral Once Shuvon Rankin, NP      . chlordiazePOXIDE (  LIBRIUM) capsule 25 mg  25 mg Oral TID Shuvon Rankin, NP       Followed by  . [START ON 09/07/2013] chlordiazePOXIDE (LIBRIUM) capsule 25 mg  25 mg Oral BH-qamhs Shuvon Rankin, NP       Followed by  . [START ON 09/08/2013] chlordiazePOXIDE (LIBRIUM) capsule 25 mg  25 mg Oral Daily Shuvon Rankin, NP      . hydrOXYzine (ATARAX/VISTARIL) tablet 25 mg  25 mg Oral Q6H PRN Shuvon Rankin, NP      . loperamide (IMODIUM) capsule 2-4 mg  2-4 mg Oral PRN Shuvon Rankin, NP      . nicotine (NICODERM CQ - dosed in mg/24 hours) patch 21 mg  21 mg Transdermal Daily Antony Madura, PA-C      . ondansetron (ZOFRAN) tablet 4 mg  4 mg Oral Q8H PRN Antony Madura, PA-C      . ondansetron (ZOFRAN-ODT) disintegrating tablet 4 mg  4 mg Oral Q6H PRN Shuvon Rankin, NP      . thiamine (VITAMIN B-1) tablet 100 mg  100 mg Oral Daily Antony Madura, PA-C   100 mg at 09/05/13 1030   Or  . thiamine (B-1) injection 100 mg  100 mg Intravenous Daily Antony Madura, PA-C        No current outpatient prescriptions on file.    Psychiatric Specialty Exam:     Blood pressure 137/95, pulse 91, temperature 98.6 F (37 C), temperature source Oral, resp. rate 16, height 6\' 2"  (1.88 m), weight 68.04 kg (150 lb), SpO2 98.00%.Body mass index is 19.25 kg/(m^2).  General Appearance: good hygiene  Eye Contact:: good  Speech:  RRR  Volume:  normal  Mood: better  Affect:  euthymic  Thought Process:  Coherent and Goal Directed  Orientation:  Full (Time, Place, and Person)  Thought Content:  Hallucinations: None  Suicidal Thoughts:  No  Homicidal Thoughts:  No  Memory:  Immediate;   Fair Recent;   Fair Remote;   Fair  Judgement:  fair  Insight:  fair  Psychomotor Activity:  nl  Concentration:  fair  Recall:  Fair  Akathisia:  Negative  Handed:  Right  AIMS (if indicated):     Assets:  Communication Skills Desire for Improvement  Sleep:      Treatment Plan Summary: Pt will be discharged today, and pt agrees to follow up at [facility]. Pt will f/u at [facility], since he already had an assessment completed there. Pt denies current withdrawal symptoms. He demonstrates future planning, and agrees to f/u as recommended.  Ancil Linsey MD 09/06/2013 2:48 PM

## 2013-09-06 NOTE — Progress Notes (Signed)
[facility] provided pt with a [facility] Card application, highlighting [facility].  °

## 2013-09-06 NOTE — Progress Notes (Addendum)
Per discussion with psychiatrist, and NP, patient psychiatrically stable for discharge home. Pt plans to follow up with [facility] in [place], and [facility] in [place]. [name] provided pt with bus pass. [name] informed RN and EDP.  [name] provided pt with cash for part pass to get to [place].  Marland KitchenCatha Gosselin, [place] [phone]  ED [name] .09/06/2013 1206pm

## 2013-09-06 NOTE — ED Notes (Signed)
Pt is awake and alert, pleasant and cooperative. Patient denies HI, SI AH or VH. Discharge vitals 137/95 HR 98 RR 16 and unlabored. Pt has outpatient treatment resources available  . Will continue to monitor for safety. Patient escorted to lobby without incident. T.Melvyn Neth RN

## 2013-09-07 NOTE — Clinical Social Work Note (Signed)
Chad Sanchez (Medical Records) contacted [name] to verify that [facility] release was correct follow-up for pt. [name] verified that this facility had been reached by pt and [name] during his last admission and was where the pt wanted to follow-up (walk in between 9-5pm). Pt signed release authorizing release of information to this facility and verified that the address was correct (as he had been pt at this facility in the past).

## 2013-09-09 ENCOUNTER — Emergency Department (HOSPITAL_COMMUNITY)
Admission: EM | Admit: 2013-09-09 | Discharge: 2013-09-10 | Disposition: A | Discharge status: Other Acute Inpatient Hospital | Payer: Federal, State, Local not specified - Other | Attending: Emergency Medicine | Admitting: Emergency Medicine

## 2013-09-09 ENCOUNTER — Emergency Department (HOSPITAL_COMMUNITY)
Admission: EM | Admit: 2013-09-09 | Discharge: 2013-09-09 | Disposition: A | Discharge status: Home / Self Care | Payer: Self-pay | Attending: Emergency Medicine | Admitting: Emergency Medicine

## 2013-09-09 ENCOUNTER — Encounter (HOSPITAL_COMMUNITY): Payer: Self-pay | Admitting: Emergency Medicine

## 2013-09-09 DIAGNOSIS — F3289 Other specified depressive episodes: Secondary | ICD-10-CM | POA: Insufficient documentation

## 2013-09-09 DIAGNOSIS — Z79899 Other long term (current) drug therapy: Secondary | ICD-10-CM | POA: Insufficient documentation

## 2013-09-09 DIAGNOSIS — F1010 Alcohol abuse, uncomplicated: Secondary | ICD-10-CM | POA: Insufficient documentation

## 2013-09-09 DIAGNOSIS — F329 Major depressive disorder, single episode, unspecified: Secondary | ICD-10-CM | POA: Insufficient documentation

## 2013-09-09 DIAGNOSIS — R45851 Suicidal ideations: Secondary | ICD-10-CM | POA: Insufficient documentation

## 2013-09-09 DIAGNOSIS — F1020 Alcohol dependence, uncomplicated: Secondary | ICD-10-CM | POA: Insufficient documentation

## 2013-09-09 DIAGNOSIS — Z9119 Patient's noncompliance with other medical treatment and regimen: Secondary | ICD-10-CM | POA: Insufficient documentation

## 2013-09-09 DIAGNOSIS — Z91199 Patient's noncompliance with other medical treatment and regimen due to unspecified reason: Secondary | ICD-10-CM | POA: Insufficient documentation

## 2013-09-09 DIAGNOSIS — F17200 Nicotine dependence, unspecified, uncomplicated: Secondary | ICD-10-CM | POA: Insufficient documentation

## 2013-09-09 DIAGNOSIS — F39 Unspecified mood [affective] disorder: Secondary | ICD-10-CM | POA: Insufficient documentation

## 2013-09-09 LAB — RAPID URINE DRUG SCREEN, HOSP PERFORMED
Cocaine: NOT DETECTED
Opiates: NOT DETECTED

## 2013-09-09 LAB — COMPREHENSIVE METABOLIC PANEL
AST: 25 U/L (ref 0–37)
BUN: 13 mg/dL (ref 6–23)
CO2: 24 mEq/L (ref 19–32)
Calcium: 9.4 mg/dL (ref 8.4–10.5)
Creatinine, Ser: 0.79 mg/dL (ref 0.50–1.35)
GFR calc Af Amer: >90 mL/min (ref >90)
GFR calc non Af Amer: >90 mL/min (ref >90)
Glucose, Bld: 111 mg/dL — ABNORMAL HIGH (ref 70–99)
Sodium: 139 mEq/L (ref 135–145)
Total Protein: 7.2 g/dL (ref 6.0–8.3)

## 2013-09-09 LAB — CBC
Hemoglobin: 12.8 g/dL — ABNORMAL LOW (ref 13.0–17.0)
MCH: 26.9 pg (ref 26.0–34.0)
MCHC: 33.2 g/dL (ref 30.0–36.0)
MCV: 81.3 fL (ref 78.0–100.0)
Platelets: 239 10*3/uL (ref 150–400)
RBC: 4.75 MIL/uL (ref 4.22–5.81)

## 2013-09-09 LAB — SALICYLATE LEVEL: Salicylate Lvl: <2 mg/dL — ABNORMAL LOW (ref 2.8–20.0)

## 2013-09-09 MED ORDER — LORAZEPAM 1 MG PO TABS
1.0000 mg | ORAL_TABLET | Freq: Three times a day (TID) | ORAL | Status: DC | PRN
  Administered 2013-09-10: 1 mg via ORAL
  Filled 2013-09-09: qty 1

## 2013-09-09 MED ORDER — ZOLPIDEM TARTRATE 5 MG PO TABS: 5.0000 mg | ORAL_TABLET | Freq: Every evening | ORAL | Status: DC | PRN

## 2013-09-09 MED ORDER — IBUPROFEN 200 MG PO TABS: 600.0000 mg | ORAL_TABLET | Freq: Three times a day (TID) | ORAL | Status: DC | PRN

## 2013-09-09 MED ORDER — NICOTINE 21 MG/24HR TD PT24
21.0000 mg | MEDICATED_PATCH | Freq: Every day | TRANSDERMAL | Status: DC
  Administered 2013-09-10: 21 mg via TRANSDERMAL
  Filled 2013-09-09: qty 1

## 2013-09-09 MED ORDER — ALUM & MAG HYDROXIDE-SIMETH 200-200-20 MG/5ML PO SUSP: 30.0000 mL | ORAL | Status: DC | PRN

## 2013-09-09 MED ORDER — ONDANSETRON HCL 4 MG PO TABS: 4.0000 mg | ORAL_TABLET | Freq: Three times a day (TID) | ORAL | Status: DC | PRN

## 2013-09-09 NOTE — ED Provider Notes (Signed)
CSN: [id number]     Arrival date & time 09/09/13  2237 History  This chart was scribed for non-physician practitioner Ivonne Andrew, PA-C working with Flint Melter, MD by Danella Maiers, ED Scribe. This patient was seen in room WTR2/WLPT2 and the patient's care was started at 10:45 PM.   Chief Complaint  Patient presents with  . Suicidal   The history is provided by the patient. No language interpreter was used.   HPI Comments: Chad Sanchez is a 42 y.o. male brought in by PD who presents to the Emergency Department complaining of suicide attempt after standing in traffic today with the intention to kill himself by being hit by a car. He has a prior h/o standing in traffic but has never been struck by a car. He has come from [facility] and was in the ED earlier today for the same problem. Patient states that he was able to "talk his way out of [facility]" and left voluntarily. He states he was drunk earlier today but not now. He states he drinks alcohol "all the time" and reports his depression has been long-term. He is not compliant with his Prozac that was prescribed to him 2 weeks ago because he states it was not helping. Denies any self injury or ingestion. Denies any other aggravating or alleviating factors. No other associated symptoms  Past Medical History  Diagnosis Date  . Alcohol abuse   . Depression    No past surgical history on file. No family history on file. History  Substance Use Topics  . Smoking status: Current Every Day Smoker -- 1.00 packs/day    Types: Cigarettes  . Smokeless tobacco: Not on file  . Alcohol Use: 7.2 oz/week    12 Cans of beer per week     Comment: a case a day; pt told this writer 12 pk daily or a fifth of vodka    Review of Systems  Psychiatric/Behavioral: Positive for suicidal ideas.  All other systems reviewed and are negative.    Allergies  Review of patient's allergies indicates no known allergies.  Home Medications   Current  Outpatient Rx  Name  Route  Sig  Dispense  Refill  . FLUoxetine (PROZAC) 20 MG tablet   Oral   Take 20 mg by mouth daily.          BP 138/88  Pulse 97  Temp(Src) 97.7 F (36.5 C) (Oral)  Resp 16  SpO2 99% Physical Exam  Nursing note and vitals reviewed. Constitutional: He is oriented to person, place, and time. He appears well-developed and well-nourished. No distress.  HENT:  Head: Normocephalic and atraumatic.  Eyes: EOM are normal.  Neck: Neck supple. No tracheal deviation present.  Cardiovascular: Normal rate.   Pulmonary/Chest: Effort normal. No respiratory distress.  Musculoskeletal: Normal range of motion.  Neurological: He is alert and oriented to person, place, and time.  Skin: Skin is warm and dry.  Psychiatric: He has a normal mood and affect. His behavior is normal.    ED Course  Procedures  DIAGNOSTIC STUDIES: Oxygen Saturation is 99% on RA, normal by my interpretation.    COORDINATION OF CARE: 11:11 PM- patient seen and evaluated. Patient well-appearing no acute distress. He is tearful and continues to express SI. Discussed treatment plan with pt, which includes medical screening and TTS consult. Pt agrees to plan. Currently he is voluntary.  Psychiatric holding orders in place. TTS consult requested.   Results for orders placed during the hospital encounter  of 09/09/13  ETHANOL      Result Value Range   Alcohol, Ethyl (B) 301 (*) 0 - 11 mg/dL  COMPREHENSIVE METABOLIC PANEL      Result Value Range   Sodium 139  135 - 145 mEq/L   Potassium 4.0  3.5 - 5.1 mEq/L   Chloride 103  96 - 112 mEq/L   CO2 25  19 - 32 mEq/L   Glucose, Bld 97  70 - 99 mg/dL   BUN 7  6 - 23 mg/dL   Creatinine, Ser 1.61  0.50 - 1.35 mg/dL   Calcium 9.4  8.4 - 09.6 mg/dL   Total Protein 8.0  6.0 - 8.3 g/dL   Albumin 4.1  3.5 - 5.2 g/dL   AST 27  0 - 37 U/L   ALT 20  0 - 53 U/L   Alkaline Phosphatase 67  39 - 117 U/L   Total Bilirubin 0.1 (*) 0.3 - 1.2 mg/dL   GFR calc non  Af Amer >90  >90 mL/min   GFR calc Af Amer >90  >90 mL/min  CBC      Result Value Range   WBC 5.7  4.0 - 10.5 K/uL   RBC 5.04  4.22 - 5.81 MIL/uL   Hemoglobin 14.2  13.0 - 17.0 g/dL   HCT 04.5  40.9 - 81.1 %   MCV 81.0  78.0 - 100.0 fL   MCH 28.2  26.0 - 34.0 pg   MCHC 34.8  30.0 - 36.0 g/dL   RDW 91.4  78.2 - 95.6 %   Platelets 252  150 - 400 K/uL  URINE RAPID DRUG SCREEN (HOSP PERFORMED)      Result Value Range   Opiates NONE DETECTED  NONE DETECTED   Cocaine NONE DETECTED  NONE DETECTED   Benzodiazepines NONE DETECTED  NONE DETECTED   Amphetamines NONE DETECTED  NONE DETECTED   Tetrahydrocannabinol NONE DETECTED  NONE DETECTED   Barbiturates NONE DETECTED  NONE DETECTED      MDM   1. Major depression   2. Suicidal ideation   3. Alcohol dependence         I personally performed the services described in this documentation, which was scribed in my presence. The recorded information has been reviewed and is accurate.    Angus Seller, PA-C 09/10/13 [id number]

## 2013-09-09 NOTE — ED Notes (Signed)
Patient is alert and oriented x3.  He states that he is suicidal and wants help due to life failures.

## 2013-09-09 NOTE — ED Notes (Addendum)
Pt belongings are currently behind triage desk. Pt has 1 pair of pants, shirt, socks, sneakers, cell phone and belt.

## 2013-09-09 NOTE — ED Notes (Signed)
Pt ok to come back to [facility] per Mylene

## 2013-09-09 NOTE — ED Provider Notes (Signed)
CSN: [id number]     Arrival date & time 09/09/13  0109 History   First MD Initiated Contact with Patient 09/09/13 0134     Chief Complaint  Patient presents with  . Medical Clearance   (Consider location/radiation/quality/duration/timing/severity/associated sxs/prior Treatment) The history is provided by the patient.  pt states hx etoh abuse, depression, and has been feeling more depressed, w suicidal thoughts about walking into traffic.states severe depression, denies specific exacerbating or alleviating factors. States when stops drinking will feel shaky, but denies hx seizures or dts. States is eating and drinking normally. No nvd. Denies any injury or ingestion. Went to [facility] and was sent her for medical clearance labs. Pt states physical health at baseline, denies other symptoms.      Past Medical History  Diagnosis Date  . Alcohol abuse   . Depression    History reviewed. No pertinent past surgical history. No family history on file. History  Substance Use Topics  . Smoking status: Current Every Day Smoker -- 1.00 packs/day    Types: Cigarettes  . Smokeless tobacco: Not on file  . Alcohol Use: 7.2 oz/week    12 Cans of beer per week     Comment: a case a day; pt told this writer 12 pk daily or a fifth of vodka    Review of Systems  Constitutional: Negative for fever.  HENT: Negative for sore throat.   Eyes: Negative for redness.  Respiratory: Negative for shortness of breath.   Cardiovascular: Negative for chest pain.  Gastrointestinal: Negative for abdominal pain.  Genitourinary: Negative for flank pain.  Musculoskeletal: Negative for back pain and neck pain.  Skin: Negative for rash.  Neurological: Negative for headaches.  Hematological: Does not bruise/bleed easily.  Psychiatric/Behavioral: Positive for suicidal ideas and dysphoric mood.    Allergies  Review of patient's allergies indicates no known allergies.  Home Medications  No current outpatient  prescriptions on file. BP 114/75  Pulse 103  Temp(Src) 98.2 F (36.8 C) (Oral)  Resp 18  Ht 6\' 2"  (1.88 m)  Wt 150 lb (68.04 kg)  BMI 19.25 kg/m2  SpO2 97% Physical Exam  Nursing note and vitals reviewed. Constitutional: He is oriented to person, place, and time. He appears well-developed and well-nourished. No distress.  HENT:  Head: Atraumatic.  Eyes: Pupils are equal, round, and reactive to light. No scleral icterus.  Neck: Neck supple. No tracheal deviation present.  Cardiovascular: Normal rate.   Pulmonary/Chest: Effort normal and breath sounds normal. No accessory muscle usage. No respiratory distress.  Abdominal: Soft. He exhibits no distension. There is no tenderness.  Musculoskeletal: He exhibits no edema.  Neurological: He is alert and oriented to person, place, and time.  No tremor or shakes.   Skin: Skin is warm and dry.  Psychiatric:  Depressed mood/flat affect.     ED Course  Procedures (including critical care time)  Results for orders placed during the hospital encounter of 09/09/13  ACETAMINOPHEN LEVEL      Result Value Range   Acetaminophen (Tylenol), Serum <15.0  10 - 30 ug/mL  CBC      Result Value Range   WBC 5.8  4.0 - 10.5 K/uL   RBC 4.75  4.22 - 5.81 MIL/uL   Hemoglobin 12.8 (*) 13.0 - 17.0 g/dL   HCT 30.8 (*) 65.7 - 84.6 %   MCV 81.3  78.0 - 100.0 fL   MCH 26.9  26.0 - 34.0 pg   MCHC 33.2  30.0 - 36.0 g/dL  RDW 14.9  11.5 - 15.5 %   Platelets 239  150 - 400 K/uL  COMPREHENSIVE METABOLIC PANEL      Result Value Range   Sodium 139  135 - 145 mEq/L   Potassium 4.0  3.5 - 5.1 mEq/L   Chloride 103  96 - 112 mEq/L   CO2 24  19 - 32 mEq/L   Glucose, Bld 111 (*) 70 - 99 mg/dL   BUN 13  6 - 23 mg/dL   Creatinine, Ser 1.61  0.50 - 1.35 mg/dL   Calcium 9.4  8.4 - 09.6 mg/dL   Total Protein 7.2  6.0 - 8.3 g/dL   Albumin 3.6  3.5 - 5.2 g/dL   AST 25  0 - 37 U/L   ALT 21  0 - 53 U/L   Alkaline Phosphatase 63  39 - 117 U/L   Total Bilirubin 0.3   0.3 - 1.2 mg/dL   GFR calc non Af Amer >90  >90 mL/min   GFR calc Af Amer >90  >90 mL/min  ETHANOL      Result Value Range   Alcohol, Ethyl (B) <11  0 - 11 mg/dL  SALICYLATE LEVEL      Result Value Range   Salicylate Lvl <2.0 (*) 2.8 - 20.0 mg/dL  URINE RAPID DRUG SCREEN (HOSP PERFORMED)      Result Value Range   Opiates NONE DETECTED  NONE DETECTED   Cocaine NONE DETECTED  NONE DETECTED   Benzodiazepines POSITIVE (*) NONE DETECTED   Amphetamines NONE DETECTED  NONE DETECTED   Tetrahydrocannabinol NONE DETECTED  NONE DETECTED   Barbiturates NONE DETECTED  NONE DETECTED      MDM  Labs.  Pt w paperwork from [place] to return there once labs done.  Reviewed nursing notes and prior charts for additional history.   Recheck awake and alert. Nad. Stable for d/c back to [facility].     Suzi Roots, MD 09/09/13 [phone]

## 2013-09-10 ENCOUNTER — Encounter (HOSPITAL_COMMUNITY): Payer: Self-pay

## 2013-09-10 ENCOUNTER — Inpatient Hospital Stay (HOSPITAL_COMMUNITY)
Admission: AD | Admit: 2013-09-10 | Discharge: 2013-09-13 | DRG: 897 | Disposition: A | Discharge status: Home / Self Care | Payer: Federal, State, Local not specified - Other | Source: Intra-hospital | Attending: Psychiatry | Admitting: Psychiatry

## 2013-09-10 ENCOUNTER — Encounter (HOSPITAL_COMMUNITY): Payer: Self-pay | Admitting: Psychiatry

## 2013-09-10 DIAGNOSIS — Z79899 Other long term (current) drug therapy: Secondary | ICD-10-CM

## 2013-09-10 DIAGNOSIS — F411 Generalized anxiety disorder: Secondary | ICD-10-CM | POA: Diagnosis present

## 2013-09-10 DIAGNOSIS — F329 Major depressive disorder, single episode, unspecified: Secondary | ICD-10-CM

## 2013-09-10 DIAGNOSIS — F3289 Other specified depressive episodes: Secondary | ICD-10-CM | POA: Diagnosis present

## 2013-09-10 DIAGNOSIS — F1020 Alcohol dependence, uncomplicated: Principal | ICD-10-CM | POA: Diagnosis present

## 2013-09-10 DIAGNOSIS — R45851 Suicidal ideations: Secondary | ICD-10-CM

## 2013-09-10 DIAGNOSIS — F1994 Other psychoactive substance use, unspecified with psychoactive substance-induced mood disorder: Secondary | ICD-10-CM

## 2013-09-10 LAB — CBC
MCH: 28.2 pg (ref 26.0–34.0)
MCV: 81 fL (ref 78.0–100.0)
Platelets: 252 10*3/uL (ref 150–400)
RDW: 14.9 % (ref 11.5–15.5)
WBC: 5.7 10*3/uL (ref 4.0–10.5)

## 2013-09-10 LAB — ETHANOL: Alcohol, Ethyl (B): 301 mg/dL — ABNORMAL HIGH (ref 0–11)

## 2013-09-10 LAB — COMPREHENSIVE METABOLIC PANEL
AST: 27 U/L (ref 0–37)
Albumin: 4.1 g/dL (ref 3.5–5.2)
Alkaline Phosphatase: 67 U/L (ref 39–117)
BUN: 7 mg/dL (ref 6–23)
CO2: 25 mEq/L (ref 19–32)
Chloride: 103 mEq/L (ref 96–112)
GFR calc Af Amer: >90 mL/min (ref >90)
Potassium: 4 mEq/L (ref 3.5–5.1)
Total Bilirubin: 0.1 mg/dL — ABNORMAL LOW (ref 0.3–1.2)

## 2013-09-10 LAB — RAPID URINE DRUG SCREEN, HOSP PERFORMED: Benzodiazepines: NOT DETECTED

## 2013-09-10 MED ORDER — ONDANSETRON HCL 4 MG PO TABS
4.0000 mg | ORAL_TABLET | Freq: Three times a day (TID) | ORAL | Status: DC | PRN
  Administered 2013-09-11: 4 mg via ORAL
  Filled 2013-09-10: qty 1

## 2013-09-10 MED ORDER — CHLORDIAZEPOXIDE HCL 25 MG PO CAPS: 25.0000 mg | ORAL_CAPSULE | Freq: Four times a day (QID) | ORAL | Status: DC | PRN

## 2013-09-10 MED ORDER — NICOTINE 21 MG/24HR TD PT24
21.0000 mg | MEDICATED_PATCH | Freq: Every day | TRANSDERMAL | Status: DC
  Administered 2013-09-11: 21 mg via TRANSDERMAL
  Filled 2013-09-10 (×5): qty 1

## 2013-09-10 MED ORDER — VITAMIN B-1 100 MG PO TABS
100.0000 mg | ORAL_TABLET | Freq: Every day | ORAL | Status: DC
  Filled 2013-09-10 (×5): qty 1

## 2013-09-10 MED ORDER — ALUM & MAG HYDROXIDE-SIMETH 200-200-20 MG/5ML PO SUSP: 30.0000 mL | ORAL | Status: DC | PRN

## 2013-09-10 MED ORDER — MAGNESIUM HYDROXIDE 400 MG/5ML PO SUSP: 30.0000 mL | Freq: Every day | ORAL | Status: DC | PRN

## 2013-09-10 MED ORDER — HYDROXYZINE HCL 25 MG PO TABS
25.0000 mg | ORAL_TABLET | Freq: Four times a day (QID) | ORAL | Status: DC | PRN
  Administered 2013-09-11: 25 mg via ORAL
  Filled 2013-09-10: qty 1
  Filled 2013-09-10 (×3): qty 20

## 2013-09-10 MED ORDER — LORAZEPAM 1 MG PO TABS
0.0000 mg | ORAL_TABLET | Freq: Four times a day (QID) | ORAL | Status: DC
  Administered 2013-09-10: 1 mg via ORAL
  Filled 2013-09-10: qty 1

## 2013-09-10 MED ORDER — CHLORDIAZEPOXIDE HCL 25 MG PO CAPS
25.0000 mg | ORAL_CAPSULE | Freq: Four times a day (QID) | ORAL | Status: AC
  Administered 2013-09-10 – 2013-09-11 (×5): 25 mg via ORAL
  Filled 2013-09-10 (×6): qty 1

## 2013-09-10 MED ORDER — ADULT MULTIVITAMIN W/MINERALS CH
1.0000 | ORAL_TABLET | Freq: Every day | ORAL | Status: DC
  Administered 2013-09-10 – 2013-09-11 (×2): 1 via ORAL
  Filled 2013-09-10 (×7): qty 1

## 2013-09-10 MED ORDER — CHLORDIAZEPOXIDE HCL 25 MG PO CAPS: 25.0000 mg | ORAL_CAPSULE | Freq: Every day | ORAL | Status: DC

## 2013-09-10 MED ORDER — IBUPROFEN 600 MG PO TABS
600.0000 mg | ORAL_TABLET | Freq: Three times a day (TID) | ORAL | Status: DC | PRN
  Administered 2013-09-13: 600 mg via ORAL
  Filled 2013-09-10: qty 1

## 2013-09-10 MED ORDER — CHLORDIAZEPOXIDE HCL 25 MG PO CAPS: 25.0000 mg | ORAL_CAPSULE | ORAL | Status: DC

## 2013-09-10 MED ORDER — THIAMINE HCL 100 MG/ML IJ SOLN: 100.0000 mg | Freq: Every day | INTRAMUSCULAR | Status: DC

## 2013-09-10 MED ORDER — VITAMIN B-1 100 MG PO TABS
100.0000 mg | ORAL_TABLET | Freq: Every day | ORAL | Status: DC
  Administered 2013-09-10: 100 mg via ORAL
  Filled 2013-09-10: qty 1

## 2013-09-10 MED ORDER — CHLORDIAZEPOXIDE HCL 25 MG PO CAPS
25.0000 mg | ORAL_CAPSULE | Freq: Three times a day (TID) | ORAL | Status: AC
  Filled 2013-09-10 (×3): qty 1

## 2013-09-10 MED ORDER — LOPERAMIDE HCL 2 MG PO CAPS: 2.0000 mg | ORAL_CAPSULE | ORAL | Status: DC | PRN

## 2013-09-10 MED ORDER — TRAZODONE HCL 50 MG PO TABS
50.0000 mg | ORAL_TABLET | Freq: Every evening | ORAL | Status: DC | PRN
  Administered 2013-09-10 – 2013-09-12 (×3): 50 mg via ORAL
  Filled 2013-09-10: qty 14
  Filled 2013-09-10 (×3): qty 1
  Filled 2013-09-10: qty 14

## 2013-09-10 MED ORDER — LORAZEPAM 1 MG PO TABS: 0.0000 mg | ORAL_TABLET | Freq: Two times a day (BID) | ORAL | Status: DC

## 2013-09-10 NOTE — ED Notes (Signed)
Report from RN Cristal Deer, reports pt SI, lots of family stressors, both parents died last year, recently DCed from [place].  Pt not forthcoming with information, very sleepy.  Will reassess in am.

## 2013-09-10 NOTE — ED Notes (Signed)
Pt now awake & communicating.  Pt reports he drinks 12 pack to 1 1/2 pint per day to keep from shaking.  Pt reports he was recently in [facility] a 2 yr residential program.  Denies AV hallucinations.  States mom died 15 yrs ago.

## 2013-09-10 NOTE — BHH Group Notes (Signed)
Adult Psychoeducational Group Note  Date:  09/10/2013 Time:  10:29 PM  Group Topic/Focus:  AA Meeting  Participation Level:  Active  Participation Quality:  Appropriate  Affect:  Appropriate  Cognitive:  Appropriate  Insight: Appropriate  Engagement in Group:  Engaged  Modes of Intervention:  Discussion and Education  Additional Comments:  Yoskar attended AA group.  Caroll Rancher A 09/10/2013, 10:29 PM

## 2013-09-10 NOTE — BHH Counselor (Signed)
Contacted EDP-Dr. Nicanor Alcon at [phone] to obtain clinicals prior to seeing patient. Writer not able to reach ED. Staff will check EDP's phone and have her return phone call.

## 2013-09-10 NOTE — BH Assessment (Addendum)
Assessment Note  Chad Sanchez is an 42 y.o. male who presents voluntarily to [facility] ED by [facility] from [place].. Reportedly he was intoxicated and made a suicide attempt after standing in traffic last night. He had intentions to kill himself by being hit by a car. He was in the ED earlier today for the same problem. Patient states that he was able to "talk his way out of [facility]" and left voluntarily. He was also in the ED 4-5 days ago with suicidal complaints stating he would stand in traffic. He has a prior h/o standing in traffic but has never been struck by a car (at least 2x's). Today patient is unable to contract for safety stating, "I will do whatever I can to kill myself". Patient reports increased depression over the past week. He is not able to identify one single stressor or trigger for his suicidal thoughts. Pt was also found intoxicated as he was standing in traffic. Upon arrival to the ED his BAL was 300+. He reports drinking daily since May 04, 2013 the day he graduated from a 10 month rehab program in [place]). He reports drinking 1/2 pint or 5th of liquor. He started drinking at age 81. His last drink was yesterday but he is unable to recall how much he drank.  He has a history of on/off cocaine use since age 65. Pt reports tremors, history of multiple black outs, no history of seizures. No HI or AVH's. Patient does not have a outpaient mental health provider at this time. Sts he was seeking treatment at [facility] in the past. He reports non compliance with follow up appointments and med mgt. He is suppose to be taking Prozac and Trazadone. Sts he took it 1x approx. 1 week ago. He has taken Zoloft in the past (2012).      Axis I: Major Depressive Disorder, Recurrent, Severe without psychotic features; Alcohol Dependence Axis II: Deferred Axis III:  Past Medical History  Diagnosis Date  . Alcohol abuse   . Depression    Axis IV: psychosocial stressors, enviormental  stressors, no primary support, No acces to resources Axis V: 31-40 impairment in reality testing  Past Medical History:  Past Medical History  Diagnosis Date  . Alcohol abuse   . Depression     History reviewed. No pertinent past surgical history.  Family History: History reviewed. No pertinent family history.  Social History:  reports that he has been smoking Cigarettes.  He has been smoking about 1.00 pack per day. He does not have any smokeless tobacco history on file. He reports that he drinks about 7.2 ounces of alcohol per week. He reports that he uses illicit drugs ("Crack" cocaine and Cocaine).  Additional Social History:  Alcohol / Drug Use Pain Medications: Pt denies having any medications. Prescriptions: No medications Over the Counter: N/A History of alcohol / drug use?: Yes Longest period of sobriety (when/how long): 3 years from 2009-2012 Negative Consequences of Use: Legal Withdrawal Symptoms: Agitation;Cramps;Blackouts;Weakness;Tremors;Patient aware of relationship between substance abuse and physical/medical complications;Tingling;Nausea / Vomiting Substance #1 Name of Substance 1: ETOH 1 - Age of First Use: 42 years of age 35 - Amount (size/oz): 18 beers per day ("Fifth or 1/2 pint"; "As much as I can get my hands on") 1 - Frequency: Daily 1 - Duration: On-going.  Pt was d/c'ed from [facility] hall at [facility] on 10/14 (daily since July 1, 14; day or release from [facility] 17mo prr) 1 - Last Use / Amount:  09/09/13 Substance #2 Name of Substance 2: Cocaine 2 - Age of First Use: 42 years of age 61 - Amount (size/oz): Varies 2 - Frequency: 2x/W 2 - Duration: Ongoing 2 - Last Use / Amount: 10/21 (08/24/13)  CIWA: CIWA-Ar BP: 113/74 mmHg Pulse Rate: 87 COWS:    Allergies: No Known Allergies  Home Medications:  (Not in a hospital admission)  OB/GYN Status:  No LMP for male patient.  General Assessment Data Location of Assessment: [facility] ED Is this a Tele or Face-to-Face  Assessment?: Tele Assessment Is this an Initial Assessment or a Re-assessment for this encounter?: Initial Assessment Living Arrangements: Alone Can pt return to current living arrangement?: Yes Admission Status: Voluntary Is patient capable of signing voluntary admission?: Yes Transfer from: Acute Hospital Referral Source: Self/Family/Friend     [facility] Crisis Care Plan Living Arrangements: Alone Name of Psychiatrist: [facility]esta Mixer (Patient does not have a psychiatrist;seen at [facility] in past) Name of Therapist:  (no therapist noted)  Education Status Is patient currently in school?: No  Risk to self Suicidal Ideation: Yes-Currently Present Suicidal Intent: Yes-Currently Present Is patient at risk for suicide?: Yes Suicidal Plan?: Yes-Currently Present Specify Current Suicidal Plan:  (pt found attempting suicide by standing in traffic) Access to Means: Yes Specify Access to Suicidal Means:  (acess to traffic ) What has been your use of drugs/alcohol within the last 12 months?:  (patient reports alcohol use (daily); occas. cocaine use) Previous Attempts/Gestures: Yes How many times?:  (1-2 prior attempts/gestures) Other Self Harm Risks:  (none reported ) Triggers for Past Attempts: Unpredictable Intentional Self Injurious Behavior: None Comment - Self Injurious Behavior:  (none reported) Family Suicide History: Unknown Recent stressful life event(s): Other (Comment) (pt does not identify any stressors ) Persecutory voices/beliefs?: No Depression: Yes Depression Symptoms: Feeling angry/irritable;Feeling worthless/self pity;Loss of interest in usual pleasures;Fatigue;Isolating;Tearfulness Substance abuse history and/or treatment for substance abuse?: No Suicide prevention information given to non-admitted patients: Not applicable  Risk to Others Homicidal Ideation: No Thoughts of Harm to Others: No Current Homicidal Intent: No Current Homicidal Plan: No Access to Homicidal Means:  No Identified Victim:  (n/a) History of harm to others?: No Assessment of Violence: None Noted Violent Behavior Description:  (patient is calm and cooperative) Does patient have access to weapons?: No Criminal Charges Pending?: No Describe Pending Criminal Charges:  (n/a) Does patient have a court date: No Court Date:  (n/a)  Psychosis Hallucinations: None noted Delusions: None noted  Mental Status Report Appear/Hygiene: Disheveled Eye Contact: Good Motor Activity: Freedom of movement Speech: Logical/coherent Level of Consciousness: Alert Mood: Depressed Affect: Appropriate to circumstance;Anxious Anxiety Level: Minimal Panic attack frequency:  (n/a) Most recent panic attack:  (n/a) Thought Processes: Coherent Judgement: Impaired Orientation: Person;Place;Time;Situation Obsessive Compulsive Thoughts/Behaviors: None  Cognitive Functioning Concentration: Decreased Memory: Recent Intact;Remote Intact IQ: Average Level of Function:  (n/a) Insight: Fair Impulse Control: Fair Appetite: Fair Weight Loss:  (n/a) Weight Gain:  (n/a) Sleep: Decreased Total Hours of Sleep:  (n/a) Vegetative Symptoms: None  ADLScreening [facility] Assessment Services) Patient's cognitive ability adequate to safely complete daily activities?: Yes Patient able to express need for assistance with ADLs?: Yes Independently performs ADLs?: Yes (appropriate for developmental age)  Prior Inpatient Therapy Prior Inpatient Therapy: Yes Prior Therapy Dates: Unknown Prior Therapy Facilty/Provider(s): [facility], [facility] Reason for Treatment: SA  Prior Outpatient Therapy Prior Outpatient Therapy: Yes Prior Therapy Dates: Current (past) Prior Therapy Facilty/Provider(s): [facility] Reason for Treatment: SI/SA (SI, SA, and med mgt.)  ADL Screening (condition at time  of admission) Patient's cognitive ability adequate to safely complete daily activities?: Yes Is the patient deaf or have difficulty hearing?:  No Does the patient have difficulty seeing, even when wearing glasses/contacts?: No Does the patient have difficulty concentrating, remembering, or making decisions?: No Patient able to express need for assistance with ADLs?: Yes Does the patient have difficulty dressing or bathing?: No Independently performs ADLs?: Yes (appropriate for developmental age) Communication: Independent Dressing (OT): Independent Grooming: Independent Feeding: Independent Bathing: Independent Toileting: Independent In/Out Bed: Independent Walks in Home: Independent Does the patient have difficulty walking or climbing stairs?: No Weakness of Legs: None Weakness of Arms/Hands: None  Home Assistive Devices/Equipment Home Assistive Devices/Equipment: None    Abuse/Neglect Assessment (Assessment to be complete while patient is alone) Physical Abuse: Denies Verbal Abuse: Denies Sexual Abuse: Denies Exploitation of patient/patient's resources: Denies Self-Neglect: Denies Values / Beliefs Cultural Requests During Hospitalization: None Spiritual Requests During Hospitalization: None   Advance Directives (For Healthcare) Advance Directive: Patient does not have advance directive Nutrition Screen- [facility] Adult/[facility] Patient's home diet: Regular  Additional Information 1:1 In Past 12 Months?: No CIRT Risk: No Elopement Risk: No Does patient have medical clearance?: Yes     Disposition:  Disposition Initial Assessment Completed for this Encounter: Yes Disposition of Patient: Inpatient treatment program Type of inpatient treatment program: Adult Other disposition(s): Other (Comment) (Pending acceptance by extender for inpatient admisison [facility])  On Site Evaluation by:   Reviewed with Physician:    Melynda Ripple [facility] 09/10/2013 4:54 AM

## 2013-09-10 NOTE — BH Assessment (Signed)
Writer ready to do a TA with this patient 0400. Contacted patient's nurse-Latricia to see if patient is alert and oriented enough to be assessed. Joanie Coddington will try to wake patient up for TA.

## 2013-09-10 NOTE — Progress Notes (Signed)
[facility] provided pt with a [facility] Card application, highlighting [facility].  °

## 2013-09-10 NOTE — BH Assessment (Signed)
Received return call back from EDP-Dr. Nicanor Alcon. Writer requested clinical information from Dr. Nicanor Alcon before seeing patient. Sts that she is not familiar with patient. She suggested that this writer call the PA-Dammen whom saw the patient. Writer attempted to reach PA but unable to reach him.

## 2013-09-10 NOTE — Consult Note (Signed)
Pt was seen with FNP. Agree with assessment and plan.  Kyna Blahnik, MD 

## 2013-09-10 NOTE — Progress Notes (Signed)
Pt accepted to [facility] pending bed availibility.   Catha Gosselin, [place] [phone]  ED [name] 09/10/2013 1136am

## 2013-09-10 NOTE — Progress Notes (Signed)
[name] gave support paperwork to [facility] who agreed to complete with patient and faxed to [facility].   Catha Gosselin, LCSW [phone]  ED [name] .09/10/2013 1153am

## 2013-09-10 NOTE — BH Assessment (Signed)
Accepted to [facility] by MD Tawni Carnes. Attending MD Dub Mikes, assigned bed 303-1.

## 2013-09-10 NOTE — Consult Note (Signed)
[facility] Face-to-Face Psychiatry Consult   Reason for Consult:  Alcohol Dependence Referring Physician:  EDP Chad Sanchez is an 42 y.o. male.  Assessment: AXIS I:  Major Depression, Recurrent severe and Alcohol dependence AXIS II:  Deferred AXIS III:   Past Medical History  Diagnosis Date  . Alcohol abuse   . Depression    AXIS IV:  economic problems, housing problems, occupational problems, other psychosocial or environmental problems, problems related to legal system/crime and problems related to social environment AXIS V:  41-50 serious symptoms  Plan:  Recommend psychiatric Inpatient admission when medically cleared.  Subjective:   Chad Sanchez is a 42 y.o. male patient admitted with Alcohol dependence, MDD RECURRENT,SEVERE.  HPI:  Chad Sanchez is an 42 y.o. male who presents voluntarily to [facility] ED by [employer] from [place].  Patient was discharged from our ER past Monday after alcohol detox in our ER.  Patient stated he started drinking right away when [facility].  He became more depressed due to his homelessness and started drinking.  He was at [facility] where he made suicidal comment and was brought to our ER.  He was cleared and went back to [facility] where he was discharged.  Patient stated he was standing in front of a moving vehicle as a suicidal gesture and that led to his coming to [place].  This am he denies SI/HI/AVH but is requesting inpatient alcohol and depression treatment.   He reports feeling hopeless and helpless and worthless.   Patient states he has been drinking 18 beers daily with a fifth of Liquor daily.  He denies alcohol withdrawal seizures but states she drinks until he passed out.  Patient was supposed to be taking Prozac and Trazodone but uses alcohol to treat his depression.  We have accepted him in our Chemical dependency unit and will admit him for depression and alcohol detox.   HPI Elements:   Location:   [facility]. Quality:  SEVERE WALKING IN FRONT OF TRAFFIC YESTERDAY. Severity:  SEVERE. Context:  HOMELESSNESS.  Past Psychiatric History: Past Medical History  Diagnosis Date  . Alcohol abuse   . Depression     reports that he has been smoking Cigarettes.  He has been smoking about 1.00 pack per day. He does not have any smokeless tobacco history on file. He reports that he drinks about 7.2 ounces of alcohol per week. He reports that he uses illicit drugs ("Crack" cocaine and Cocaine). History reviewed. No pertinent family history. Family History Substance Abuse: No Family Supports: No ("I have 13 brothers and sisters that judge me") Living Arrangements: Alone Can pt return to current living arrangement?: Yes Abuse/Neglect [facility]) Physical Abuse: Denies Verbal Abuse: Denies Sexual Abuse: Denies Allergies:  No Known Allergies  ACT Assessment Complete:  Yes:    Educational Status    Risk to Self: Risk to self Suicidal Ideation: Yes-Currently Present Suicidal Intent: Yes-Currently Present Is patient at risk for suicide?: Yes Suicidal Plan?: Yes-Currently Present Specify Current Suicidal Plan:  (pt found attempting suicide by standing in traffic) Access to Means: Yes Specify Access to Suicidal Means:  (acess to traffic ) What has been your use of drugs/alcohol within the last 12 months?:  (patient reports alcohol use (daily); occas. cocaine use) Previous Attempts/Gestures: Yes How many times?:  (1-2 prior attempts/gestures) Other Self Harm Risks:  (none reported ) Triggers for Past Attempts: Unpredictable Intentional Self Injurious Behavior: None Comment - Self Injurious Behavior:  (  none reported) Family Suicide History: Unknown Recent stressful life event(s): Other (Comment) (pt does not identify any stressors ) Persecutory voices/beliefs?: No Depression: Yes Depression Symptoms: Feeling angry/irritable;Feeling worthless/self pity;Loss of interest in usual  pleasures;Fatigue;Isolating;Tearfulness Substance abuse history and/or treatment for substance abuse?: Yes Suicide prevention information given to non-admitted patients: Not applicable  Risk to Others: Risk to Others Homicidal Ideation: No Thoughts of Harm to Others: No Current Homicidal Intent: No Current Homicidal Plan: No Access to Homicidal Means: No Identified Victim:  (n/a) History of harm to others?: No Assessment of Violence: None Noted Violent Behavior Description:  (patient is calm and cooperative) Does patient have access to weapons?: No Criminal Charges Pending?: No Describe Pending Criminal Charges:  (n/a) Does patient have a court date: No Court Date:  (n/a)  Abuse: Abuse/Neglect Assessment (Assessment to be complete while patient is alone) Physical Abuse: Denies Verbal Abuse: Denies Sexual Abuse: Denies Exploitation of patient/patient's resources: Denies Self-Neglect: Denies  Prior Inpatient Therapy: Prior Inpatient Therapy Prior Inpatient Therapy: Yes Prior Therapy Dates: Unknown Prior Therapy Facilty/Provider(s): [facility], [facility] Reason for Treatment: SA  Prior Outpatient Therapy: Prior Outpatient Therapy Prior Outpatient Therapy: Yes Prior Therapy Dates: Current (past) Prior Therapy Facilty/Provider(s): [facility] Reason for Treatment: SI/SA (SI, SA, and med mgt.)  Additional Information: Additional Information 1:1 In Past 12 Months?: No CIRT Risk: No Elopement Risk: No Does patient have medical clearance?: Yes                  Objective: Blood pressure 137/76, pulse 95, temperature 97.6 F (36.4 C), temperature source Oral, resp. rate 16, SpO2 98.00%.There is no weight on file to calculate BMI. Results for orders placed during the hospital encounter of 09/09/13 (from the past 72 hour(s))  ETHANOL     Status: Abnormal   Collection Time    09/09/13 11:04 PM      Result Value Range   Alcohol, Ethyl (B) 301 (*) 0 - 11 mg/dL   Comment:             LOWEST DETECTABLE LIMIT FOR     SERUM ALCOHOL IS 11 mg/dL     FOR MEDICAL PURPOSES ONLY  COMPREHENSIVE METABOLIC PANEL     Status: Abnormal   Collection Time    09/09/13 11:04 PM      Result Value Range   Sodium 139  135 - 145 mEq/L   Potassium 4.0  3.5 - 5.1 mEq/L   Chloride 103  96 - 112 mEq/L   CO2 25  19 - 32 mEq/L   Glucose, Bld 97  70 - 99 mg/dL   BUN 7  6 - 23 mg/dL   Creatinine, Ser 1.61  0.50 - 1.35 mg/dL   Calcium 9.4  8.4 - 09.6 mg/dL   Total Protein 8.0  6.0 - 8.3 g/dL   Albumin 4.1  3.5 - 5.2 g/dL   AST 27  0 - 37 U/L   ALT 20  0 - 53 U/L   Alkaline Phosphatase 67  39 - 117 U/L   Total Bilirubin 0.1 (*) 0.3 - 1.2 mg/dL   GFR calc non Af Amer >90  >90 mL/min   GFR calc Af Amer >90  >90 mL/min   Comment: (NOTE)     The eGFR has been calculated using the CKD EPI equation.     This calculation has not been validated in all clinical situations.     eGFR's persistently <90 mL/min signify possible Chronic Kidney     Disease.  CBC     Status: None   Collection Time    09/09/13 11:04 PM      Result Value Range   WBC 5.7  4.0 - 10.5 K/uL   RBC 5.04  4.22 - 5.81 MIL/uL   Hemoglobin 14.2  13.0 - 17.0 g/dL   HCT 40.9  81.1 - 91.4 %   MCV 81.0  78.0 - 100.0 fL   MCH 28.2  26.0 - 34.0 pg   MCHC 34.8  30.0 - 36.0 g/dL   RDW 78.2  95.6 - 21.3 %   Platelets 252  150 - 400 K/uL  URINE RAPID DRUG SCREEN (HOSP PERFORMED)     Status: None   Collection Time    09/09/13 11:13 PM      Result Value Range   Opiates NONE DETECTED  NONE DETECTED   Cocaine NONE DETECTED  NONE DETECTED   Benzodiazepines NONE DETECTED  NONE DETECTED   Comment: DELTA CHECK NOTED   Amphetamines NONE DETECTED  NONE DETECTED   Tetrahydrocannabinol NONE DETECTED  NONE DETECTED   Barbiturates NONE DETECTED  NONE DETECTED   Comment:            DRUG SCREEN FOR MEDICAL PURPOSES     ONLY.  IF CONFIRMATION IS NEEDED     FOR ANY PURPOSE, NOTIFY LAB     WITHIN 5 DAYS.                LOWEST DETECTABLE  LIMITS     FOR URINE DRUG SCREEN     Drug Class       Cutoff (ng/mL)     Amphetamine      1000     Barbiturate      200     Benzodiazepine   200     Tricyclics       300     Opiates          300     Cocaine          300     THC              50   Labs are reviewed and are pertinent for Alcohol level  301, rest of result is unremarkable.  Current Facility-Administered Medications  Medication Dose Route Frequency Provider Last Rate Last Dose  . alum & mag hydroxide-simeth (MAALOX/MYLANTA) 200-200-20 MG/5ML suspension 30 mL  30 mL Oral PRN Phill Mutter Dammen, PA-C      . ibuprofen (ADVIL,MOTRIN) tablet 600 mg  600 mg Oral Q8H PRN Angus Seller, PA-C      . LORazepam (ATIVAN) tablet 0-4 mg  0-4 mg Oral Q6H Peter S Dammen, PA-C       Followed by  . [START ON 09/12/2013] LORazepam (ATIVAN) tablet 0-4 mg  0-4 mg Oral Q12H Phill Mutter Dammen, PA-C      . LORazepam (ATIVAN) tablet 1 mg  1 mg Oral Q8H PRN Angus Seller, PA-C   1 mg at 09/10/13 0433  . nicotine (NICODERM CQ - dosed in mg/24 hours) patch 21 mg  21 mg Transdermal Daily Phill Mutter Dammen, PA-C   21 mg at 09/10/13 1038  . ondansetron (ZOFRAN) tablet 4 mg  4 mg Oral Q8H PRN Phill Mutter Dammen, PA-C      . thiamine (VITAMIN B-1) tablet 100 mg  100 mg Oral Daily Phill Mutter Dammen, PA-C   100 mg at 09/10/13 1038   Or  . thiamine (B-1) injection 100  mg  100 mg Intravenous Daily Phill Mutter Dammen, PA-C      . zolpidem (AMBIEN) tablet 5 mg  5 mg Oral QHS PRN Angus Seller, PA-C       Current Outpatient Prescriptions  Medication Sig Dispense Refill  . FLUoxetine (PROZAC) 20 MG tablet Take 20 mg by mouth daily.        Psychiatric Specialty Exam:     Blood pressure 137/76, pulse 95, temperature 97.6 F (36.4 C), temperature source Oral, resp. rate 16, SpO2 98.00%.There is no weight on file to calculate BMI.  General Appearance: Casual  Eye Contact::  Good  Speech:  Clear and Coherent and Normal Rate  Volume:  Normal  Mood:  Anxious, Depressed,  Dysphoric, Euthymic and Worthless  Affect:  Depressed and Flat  Thought Process:  Coherent and suicidal  Orientation:  Full (Time, Place, and Person)  Thought Content:  suicidal  Suicidal Thoughts:  Yes.  without intent/plan  Homicidal Thoughts:  No  Memory:  Immediate;   Good  Judgement:  Poor  Insight:  Lacking and Shallow  Psychomotor Activity:  Normal  Concentration:  Fair  Recall:  NA  Akathisia:  NA  Handed:  Right  AIMS (if indicated):     Assets:  Desire for Improvement  Sleep:      Treatment Plan Summary:  Consult and face to face interview with Dr Tawni Carnes We have accepted patient for treatment of his depression and Alcohol dependence We expect and will encourage patient to participate in our group and individual therapy in the inpatient unit. Daily contact with patient to assess and evaluate symptoms and progress in treatment Medication management  Earney Navy  PMHNP-BC 09/10/2013 11:52 AM

## 2013-09-10 NOTE — Progress Notes (Signed)
D) 42 year old male who has been admitted to the service of Dr. Dub Mikes for alcohol withdrawal. Pt presented to the ED with a with ABL of 303. Pt reports that he has been drinking a 12 pack of beer since he was discharged on 08-26-13. States he also feels suicidal, less so since his admission. States "I feel safe in the hospital". States, "I really haven't been serious about being here in the past, but I am serious now. I want to stop drinking and I want to feel better".  A) Pt given support, reassurance, praise and encouragement. Encouraged to really work on his detox along with learning some new skills and following up with his outpatient plans. Verbal contract made with Pt for his safety. R) Pt states that he will contract for his safety.

## 2013-09-10 NOTE — ED Notes (Signed)
Report to Lamount Cranker RN for transfer to 303-2.

## 2013-09-11 ENCOUNTER — Encounter (HOSPITAL_COMMUNITY): Payer: Self-pay | Admitting: Psychiatry

## 2013-09-11 DIAGNOSIS — F411 Generalized anxiety disorder: Secondary | ICD-10-CM

## 2013-09-11 DIAGNOSIS — F1010 Alcohol abuse, uncomplicated: Secondary | ICD-10-CM

## 2013-09-11 DIAGNOSIS — F332 Major depressive disorder, recurrent severe without psychotic features: Secondary | ICD-10-CM

## 2013-09-11 MED ORDER — FLUOXETINE HCL 20 MG PO CAPS
20.0000 mg | ORAL_CAPSULE | Freq: Every day | ORAL | Status: DC
  Administered 2013-09-11: 20 mg via ORAL
  Filled 2013-09-11: qty 1
  Filled 2013-09-11: qty 14
  Filled 2013-09-11: qty 1
  Filled 2013-09-11: qty 14
  Filled 2013-09-11 (×2): qty 1
  Filled 2013-09-11: qty 14

## 2013-09-11 NOTE — Progress Notes (Signed)
Patient ID: Chad Sanchez, male   DOB: Jul 19, 1971, 42 y.o.   MRN: [id number] Patient reports he is interested in accountability of an [address]. Has court dates of 11/14 and 11/18 upcoming for child support issues. Clinical research associate provided copy of [employer] with vacancies to patient; hand delivered in [employer].  Carney Bern, LCSW

## 2013-09-11 NOTE — Progress Notes (Signed)
D- Patient is out in milieu attending groups with active participation and interacting with peers.  Patient is reporting "a good day" and has shared with this Clinical research associate that he is motivated for treatment.  Denies SI. No physical complaints and no prn medications requested.  Declined noon librium, otherwise compliant with scheduled medicatoins.  A-Support and encouragement offered.  Continue current POC and evaluation of treatment goals.  Continue 15' checks for safety.  R- Safety maintained.

## 2013-09-11 NOTE — [facility] Group Notes (Signed)
[facility] Group Notes: (Clinical Social Work)   09/11/2013      Type of Therapy:  Group Therapy   Participation Level:  Did Not Attend    Ambrose Mantle, LCSW 09/11/2013, 5:42 PM

## 2013-09-11 NOTE — [facility] Suicide Risk Assessment (Signed)
Suicide Risk Assessment  Admission Assessment     Nursing information obtained from:  Patient Demographic factors:  Male;Low socioeconomic status Current Mental Status:  Suicidal ideation indicated by patient Loss Factors:  Financial problems / change in socioeconomic status Historical Factors:  Prior suicide attempts Risk Reduction Factors:  Living with another person, especially a relative  CLINICAL FACTORS:   Severe Anxiety and/or Agitation Depression:   Anhedonia Comorbid alcohol abuse/dependence Hopelessness Impulsivity Insomnia Recent sense of peace/wellbeing Severe Alcohol/Substance Abuse/Dependencies Unstable or Poor Therapeutic Relationship Previous Psychiatric Diagnoses and Treatments  COGNITIVE FEATURES THAT CONTRIBUTE TO RISK:  Closed-mindedness Loss of executive function Polarized thinking Thought constriction (tunnel vision)    SUICIDE RISK:   Moderate:  Frequent suicidal ideation with limited intensity, and duration, some specificity in terms of plans, no associated intent, good self-control, limited dysphoria/symptomatology, some risk factors present, and identifiable protective factors, including available and accessible social support.  PLAN OF CARE: Admitted from [facility] for substance induced mood disorder, alcohol dependence and will be receiving alcohol detox treatment. He has court cases regarding child support and larceny for possession of alcohol.   I certify that inpatient services furnished can reasonably be expected to improve the patient's condition.  Shelby Anderle,JANARDHAHA R. 09/11/2013, 9:48 AM

## 2013-09-11 NOTE — Progress Notes (Signed)
Patient ID: Chad Sanchez, male   DOB: October 21, 1971, 42 y.o.   MRN: [id number] D: Patient in room on approach. Pt presented with depressed mood and flat affect. Pt denies any s/s of withdrawals. Pt denies SI/HI/AVH and pain. Pt attended evening AA group and engaged in discussion. Pt denies any needs or concerns.  Cooperative with assessment. No acute distressed noted at this time.   A: Met with pt 1:1. Medications administered as prescribed. Writer encouraged pt to discuss feelings. Pt encouraged to come to staff with any question or concerns.    R: Patient remains safe. He is complaint with medications and denies any adverse reaction. Continue current POC.

## 2013-09-11 NOTE — Progress Notes (Signed)
D.  Pt pleasant but flat on approach.  Denies complaints other than insomnia at this time.  Interacting appropriately with peers on unit.   Denies SI/HI/hallucinations at this time.  A.  Support and encouragement offered, medication given as ordered for insomnia.  R.  Pt remains safe on unit, will continue to monitor.
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
## 2013-09-11 NOTE — [facility] Group Notes (Signed)
[facility] Group Notes:  (Nursing/MHT/Case Management/Adjunct)  Date:  09/11/2013  Time:  12:50 PM  Type of Therapy:  Psychoeducational Skills  Participation Level:  Active  Participation Quality:  Appropriate  Affect:  Appropriate  Cognitive:  Appropriate  Insight:  Appropriate  Engagement in Group:  Engaged  Modes of Intervention:  Problem-solving  Summary of Progress/Problems: Pt attended self inventory group, and also worked on Optician, dispensing.   Chad Sanchez 09/11/2013, 12:50 PM

## 2013-09-11 NOTE — B[facility] Counselor (Signed)
Adult Psychosocial Assessment Update Interdisciplinary Team  Previous [facility] Hospital admissions/discharges:  Admissions Discharges  Date: 08/26/13 Date:  08/27/13  Date: 08/17/13 Date:  08/20/13  Date:  06/04/13 Date:  06/07/13  Date: Date:  Date: Date:   Changes since the last Psychosocial Assessment (including adherence to outpatient mental health and/or substance abuse treatment, situational issues contributing to decompensation and/or relapse). Patiient reports he has been living at a boarding house since here last and using alcohol   On daily basis drinking a 12 pk or 1.5 pints. Patient is aware of recovery process and  Reports not certain another treatment center can teach him anything he does not know  At this point.  Does agree that he needs some accountability and feels an [employer]   A good placement.  Patient has court 11/14 and 11/18 for child support issues.     Discharge Plan 1. Will you be returning to the same living situation after discharge?   Yes: No: X     If no, what is your plan?           2. Would you like a referral for services when you are discharged? Yes:  X   If yes, for what services?  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                  Signature:  Clide Dales, 09/11/2013 5:26 PM

## 2013-09-11 NOTE — ED Provider Notes (Signed)
Medical screening examination/treatment/procedure(s) were performed by non-physician practitioner and as supervising physician I was immediately available for consultation/collaboration.  Ruchy Wildrick L Seville Downs, MD 09/11/13 0707 

## 2013-09-12 NOTE — [facility] Group Notes (Signed)
[facility] Group Notes:  (Clinical Social Work)  09/12/2013  10:00-11:00AM  Summary of Progress/Problems:   The main focus of today's process group was to   identify the patient's current support system and decide on other supports that can be put in place.  The picture on workbook was used to discuss why additional supports are needed, and a hand-out was distributed with four definitions/levels of support, then used to talk about how patients have given and received all different kinds of support.  An emphasis was placed on using counselor, doctor, therapy groups, 12-step groups, and problem-specific support groups to expand supports.  The patient identified one additional support as being his 3 adult children.  He got up within 5 minutes of group starting, left and did not return.  Type of Therapy:  Process Group with Motivational Interviewing  Participation Level:  Minimal  Participation Quality:  Resistant  Affect:  Blunted and Defensive  Cognitive:  Alert  Insight:  Limited  Engagement in Therapy:  Limited  Modes of Intervention:   Education, Support and Processing, Activity  Ambrose Mantle, LCSW 09/12/2013, 12:15pm

## 2013-09-12 NOTE — Progress Notes (Signed)
D: Pt denies SI/HI/AVH. Pt is pleasant and cooperative. Pt states he's just ready to leave. Pt denies any widthdral symptoms.   A: Pt was offered support and encouragement. Pt was given scheduled medications. Pt was encourage to attend groups. Q 15 minute checks were done for safety.   R:Pt attends groups and interacts well with peers and staff. Pt is taking medication. Pt has no complaints at this time.Pt receptive to treatment and safety maintained on unit.

## 2013-09-12 NOTE — Progress Notes (Signed)
[facility] Group Notes:  (Nursing/MHT/Case Management/Adjunct)  Date:  09/11/2013 Time:  8:00p.m.  Type of Therapy:  Psychoeducational Skills  Participation Level:  Active  Participation Quality:  Monopolizing  Affect:  Anxious  Cognitive:  Appropriate  Insight:  Good  Engagement in Group:  Monopolizing  Modes of Intervention:  Education  Summary of Progress/Problems: The patient verbalized that he had a good due in part to one of the groups that he attended. He mentioned that he felt good in the morning, but has felt "agitated" since the afternoon. He explained that this feeling is the result of looking at other patients and realizing that they have quite a bit to be thankful for and yet, they are not displaying this behavior.   Asherah Lavoy S 09/12/2013, 12:21 AM

## 2013-09-12 NOTE — Progress Notes (Signed)
[facility] MD Progress Note  09/12/2013 12:33 PM Chad Sanchez  MRN:  [id number] Subjective:  Patient states he is ready to leave. He denies withdrawal symptoms. States he doesn't need any more medication. Feels like it's useless to continue medication. Diagnosis:   DSM5: Substance/Addictive Disorders: Alcohol Related Disorder - Severe (303.90) and Alcohol Intoxication without Use Disorder (F10.929)  Depressive Disorders: Major Depressive Disorder - Moderate (296.22)  AXIS I: Alcohol Abuse, Anxiety Disorder NOS and Major Depression, Recurrent severe  AXIS II: Deferred  AXIS III:  Past Medical History   Diagnosis  Date   .  Alcohol abuse    .  Depression     AXIS IV: other psychosocial or environmental problems, problems related to social environment and problems with primary support group  AXIS V: 41-50 serious symptoms   ADL's:  Intact  Sleep: Good  Appetite:  Good  Suicidal Ideation:  denies Homicidal Ideation:  denies AEB (as evidenced by):  Psychiatric Specialty Exam: ROS  Blood pressure 122/72, pulse 105, temperature 98.3 F (36.8 C), temperature source Oral, resp. rate 18, height 6\' 2"  (1.88 m), weight 72.576 kg (160 lb), SpO2 98.00%.Body mass index is 20.53 kg/(m^2).  General Appearance: Fairly Groomed  Patent attorney::  Good  Speech:  Clear and Coherent  Volume:  Normal  Mood:  Euthymic  Affect:  Congruent  Thought Process:  Goal Directed  Orientation:  Full (Time, Place, and Person)  Thought Content:  WDL  Suicidal Thoughts:  No  Homicidal Thoughts:  No  Memory:  NA  Judgement:  Intact  Insight:  Present  Psychomotor Activity:  NA  Concentration:  Fair  Recall:  Fair  Akathisia:  NA  Handed:  Right  AIMS (if indicated):     Assets:  Communication Skills Desire for Improvement  Sleep:  Number of Hours: 6.75   Current Medications: Current Facility-Administered Medications  Medication Dose Route Frequency Provider Last Rate Last Dose  . alum & mag  hydroxide-simeth (MAALOX/MYLANTA) 200-200-20 MG/5ML suspension 30 mL  30 mL Oral PRN Caprice Kluver, MD      . chlordiazePOXIDE (LIBRIUM) capsule 25 mg  25 mg Oral TID Caprice Kluver, MD       Followed by  . [START ON 09/13/2013] chlordiazePOXIDE (LIBRIUM) capsule 25 mg  25 mg Oral BH-qamhs Vinay Genia Harold, MD       Followed by  . [START ON 09/14/2013] chlordiazePOXIDE (LIBRIUM) capsule 25 mg  25 mg Oral Daily Vinay Genia Harold, MD      . chlordiazePOXIDE (LIBRIUM) capsule 25 mg  25 mg Oral Q6H PRN Caprice Kluver, MD      . FLUoxetine (PROZAC) capsule 20 mg  20 mg Oral Daily Nanine Means, NP   20 mg at 09/11/13 1622  . hydrOXYzine (ATARAX/VISTARIL) tablet 25 mg  25 mg Oral Q6H PRN Caprice Kluver, MD   25 mg at 09/11/13 2135  . ibuprofen (ADVIL,MOTRIN) tablet 600 mg  600 mg Oral Q8H PRN Caprice Kluver, MD      . loperamide (IMODIUM) capsule 2-4 mg  2-4 mg Oral PRN Caprice Kluver, MD      . magnesium hydroxide (MILK OF MAGNESIA) suspension 30 mL  30 mL Oral Daily PRN Caprice Kluver, MD      . multivitamin with minerals tablet 1 tablet  1 tablet Oral Daily Caprice Kluver, MD   1 tablet at 09/11/13 0809  . nicotine (NICODERM CQ - dosed in mg/24 hours) patch 21  mg  21 mg Transdermal Daily Caprice Kluver, MD   21 mg at 09/11/13 0810  . ondansetron (ZOFRAN) tablet 4 mg  4 mg Oral Q8H PRN Caprice Kluver, MD   4 mg at 09/11/13 0829  . thiamine (VITAMIN B-1) tablet 100 mg  100 mg Oral Daily Vinay Genia Harold, MD       Or  . thiamine (B-1) injection 100 mg  100 mg Intravenous Daily Caprice Kluver, MD      . traZODone (DESYREL) tablet 50 mg  50 mg Oral QHS PRN Caprice Kluver, MD   50 mg at 09/11/13 2135    Lab Results: No results found for this or any previous visit (from the past 48 hour(s)).  Physical Findings: AIMS: Facial and Oral Movements Muscles of Facial Expression: None, normal Lips and Perioral Area: None, normal Jaw: None, normal Tongue: None, normal,Extremity Movements Upper  (arms, wrists, hands, fingers): None, normal Lower (legs, knees, ankles, toes): None, normal, Trunk Movements Neck, shoulders, hips: None, normal, Overall Severity Severity of abnormal movements (highest score from questions above): None, normal Incapacitation due to abnormal movements: None, normal Patient's awareness of abnormal movements (rate only patient's report): No Awareness, Dental Status Current problems with teeth and/or dentures?: No Does patient usually wear dentures?: No  CIWA:  CIWA-Ar Total: 0 COWS:     Treatment Plan Summary: Daily contact with patient to assess and evaluate symptoms and progress in treatment Medication management  Plan: 1. Continue crisis management and stabilization. 2. Medication management to reduce current symptoms to base line and improve patient's overall level of functioning 3. Treat health problems as indicated. 4. Develop treatment plan to decrease risk of relapse upon discharge and the need for     readmission. 5. Psycho-social education regarding relapse prevention and self care. 6. Health care follow up as needed for medical problems. 7. Continue home medications where appropriate. 8. Anticipate D/C home in AM. He plans to return to [facility] to see if he can get a bed. If he can't he states he is ready to return to [place].   Medical Decision Making Problem Points:  Established problem, stable/improving (1) Data Points:  Review or order clinical lab tests (1)  I certify that inpatient services furnished can reasonably be expected to improve the patient's condition.   Rona Ravens. [place] RPAC 3:13 PM 09/12/2013  Reviewed the information documented and agree with the treatment plan.  Ma Munoz,JANARDHAHA R. 09/12/2013 3:39 PM

## 2013-09-12 NOTE — [facility] Group Notes (Signed)
[facility] Group Notes:  (Nursing/MHT/Case Management/Adjunct)  Date:  09/12/2013  Time:  1:15  Type of Therapy:  Nurse Education  Participation Level:  Did Not Attend  Participation Quality:    Affect:    Cognitive:    Insight:    Engagement in Group:    Modes of Intervention:    Summary of Progress/Problems:  Chad Sanchez 09/12/2013, 2:15 PM

## 2013-09-13 DIAGNOSIS — F1910 Other psychoactive substance abuse, uncomplicated: Secondary | ICD-10-CM

## 2013-09-13 DIAGNOSIS — F1994 Other psychoactive substance use, unspecified with psychoactive substance-induced mood disorder: Secondary | ICD-10-CM

## 2013-09-13 DIAGNOSIS — F329 Major depressive disorder, single episode, unspecified: Secondary | ICD-10-CM

## 2013-09-13 MED ORDER — FLUOXETINE HCL 20 MG PO TABS: 20.0000 mg | ORAL_TABLET | Freq: Every day | ORAL | Status: DC

## 2013-09-13 MED ORDER — TRAZODONE HCL 50 MG PO TABS: 50.0000 mg | ORAL_TABLET | Freq: Every evening | ORAL | Status: DC | PRN

## 2013-09-13 MED ORDER — HYDROXYZINE HCL 25 MG PO TABS: 25.0000 mg | ORAL_TABLET | Freq: Four times a day (QID) | ORAL | Status: DC | PRN

## 2013-09-13 NOTE — Tx Team (Signed)
Interdisciplinary Treatment Plan Update (Adult)  Date: 09/13/2013   Time Reviewed: 10:37 AM  Progress in Treatment:  Attending groups: Yes  Participating in groups:  Yes  Taking medication as prescribed: Yes  Tolerating medication: Yes  Family/Significant othe contact made: No. Pt refused family contact. SPE completed with pt.  Patient understands diagnosis: Yes, AEB seeking treatment for ETOH detox, SI with plan, mood stabilization, and medication management.  Discussing patient identified problems/goals with staff: Yes  Medical problems stabilized or resolved: Yes  Denies suicidal/homicidal ideation: Yes during group/self report.  Patient has not harmed self or Others: Yes  New problem(s) identified: none  Discharge Plan or Barriers: Pt to go to [facility] in [place] and will follow up at [facility]. Pt going to [place] at d/c to get belongings and is in need of part bus money and bus pass.  Additional comments: N/A  Reason for Continuation of Hospitalization: d/c today  Estimated length of stay: d/c today For review of initial/current patient goals, please see plan of care.  Attendees:  Patient:    Family:    Physician: Geoffery Lyons MD 09/13/2013 10:37 AM   Nursing: Lupita Leash RN  09/13/2013 10:36 AM   Clinical Social Worker Sajjad Honea Smart, LCSWA  09/13/2013 10:37 AM   Other: Kendell Bane RN  09/13/2013 10:37 AM   Other: Darden Dates Nurse CM 09/13/2013 10:37 AM   Other: Massie Kluver, Community Care Coordinator  09/13/2013 10:37 AM   Other:    Scribe for Treatment Team:  [employer] LCSWA 09/13/2013 10:37 AM

## 2013-09-13 NOTE — Progress Notes (Signed)
Pt discharged per MD orders; pt currently denies SI/HI and auditory/visual hallucinations; pt was given education by RN regarding follow-up appointments and medications and pt denied any questions or concerns about these instructions; pt was then escorted to search room to retrieve his belongings by RN before being discharged to hospital lobby. 

## 2013-09-13 NOTE — BHH Suicide Risk Assessment (Signed)
B[facility] INPATIENT:  Family/Significant Other Suicide Prevention Education  Suicide Prevention Education:  Patient Refusal for Family/Significant Other Suicide Prevention Education: The patient Chad Sanchez has refused to provide written consent for family/significant other to be provided Family/Significant Other Suicide Prevention Education during admission and/or prior to discharge.  Physician notified.  SPE completed with pt. He was given SPI pamphlet and encouraged to ask questions, talk about concerns, and share information with his support network.    Smart, Melvyn Hommes 09/13/2013, 10:41 AM

## 2013-09-13 NOTE — B[facility] Group Notes (Signed)
[facility] LCSW Aftercare Discharge Planning Group Note   09/13/2013 10:42 AM  Participation Quality:  Appropriate   Mood/Affect:  Appropriate  Depression Rating:  0  Anxiety Rating:  1  Thoughts of Suicide:  No Will you contract for safety?   NA  Current AVH:  No  Plan for Discharge/Comments:  Pt reports that he found two [employer] in [place] and has interviews with them. He plans to follow up with [facility] for med management and assessment for therapy services until his court dates are completed. He plans to eventually move back to [place], [place] to be close to his family.   Transportation Means: Pt in need of bus pass and part bus money.   Supports: family supports in [place].    Smart, [employer]

## 2013-09-13 NOTE — Progress Notes (Signed)
Adult Psychoeducational Group Note  Date:  09/13/2013 Time:  7:10 PM  Group Topic/Focus:  Self Care:   The focus of this group is to help patients understand the importance of self-care in order to improve or restore emotional, physical, spiritual, interpersonal, and financial health.  Participation Level:  Active  Participation Quality:  Appropriate, Attentive, Sharing and Supportive  Affect:  Appropriate  Cognitive:  Appropriate  Insight: Appropriate and Good  Engagement in Group:  Engaged  Modes of Intervention:  Activity, Discussion, Education, Socialization and Support  Additional Comments:  [place] attended group and participated in group. Patient shared the definition of self-care and completed the self care assessment in the workbook. There was five self care areas: emotional, physical, psychological, relationship, spiritual. Patient discussed with peers in group about strengths and weakness in the areas of self care. Patient shared one goal in a area that the patient needs improvement.    Karleen Hampshire Brittini 09/13/2013, 7:10 PM

## 2013-09-13 NOTE — Discharge Summary (Signed)
Physician Discharge Summary Note  Patient:  Chad Sanchez is an 42 y.o., male MRN:  [id number] DOB:  1971/05/21 Patient phone:  [phone] (home)  Patient address:   [address] [place] [address],   Date of Admission:  09/10/2013 Date of Discharge: 11/102014  Reason for Admission:  Alcohol detox  Discharge Diagnoses: Principal Problem:   Alcohol dependence Active Problems:   Substance induced mood disorder  Review of Systems  Constitutional: Negative.   HENT: Negative.   Eyes: Negative.   Respiratory: Negative.   Cardiovascular: Negative.   Gastrointestinal: Negative.   Genitourinary: Negative.   Musculoskeletal: Negative.   Skin: Negative.   Neurological: Negative.   Endo/Heme/Allergies: Negative.   Psychiatric/Behavioral: Positive for depression and substance abuse. The patient is nervous/anxious.     DSM5:  Substance/Addictive Disorders:  Alcohol Related Disorder - Severe (303.90) and Alcohol Intoxication without Use Disorder (F10.929) Depressive Disorders:  Major Depressive Disorder - Moderate (296.22)  Axis Diagnosis:   AXIS I:  Anxiety Disorder NOS, Depressive Disorder NOS, Substance Abuse and Substance Induced Mood Disorder AXIS II:  Deferred AXIS III:   Past Medical History  Diagnosis Date  . Alcohol abuse   . Depression    AXIS IV:  other psychosocial or environmental problems, problems related to social environment and problems with primary support group AXIS V:  41-50 serious symptoms  Level of Care:  OP  Hospital Course:  On admission:  42 y.o. male brought in by PD who presents to the Emergency Department complaining of suicide attempt after standing in traffic today with the intention to kill himself by being hit by a car. He has a prior h/o standing in traffic but has never been struck by a car.Chad Sanchez drinks over a 12 pack daily. He has come from [place] and was in the ED earlier today for the same  problem. Patient states that he was able to "talk his way out of [facility]" and left voluntarily. He states he was drunk earlier today but not now. He states he drinks alcohol "all the time" and reports his depression has been long-term. He is not compliant with his Prozac that was prescribed to him 2 weeks ago because he states it was not helping.   During hospitalization:  Librium protocol implemented successfully.  His Prozac 20 mg daily for depression continued, Vistaril every 6 hours PRN anxiety, and Trazodone 50 mg at bedtime for sleep started.  Chad Sanchez participated and attended therapy.  Patient denied suicidal/homicidal ideations and auditory/visual hallucinations, follow-up appointments encouraged to attend, outside support groups encouraged and information given, Rx given.  Chad Sanchez is mentally and physically stable for discharge.  Consults:  None  Significant Diagnostic Studies:  labs: completed, reviewed, stable  Discharge Vitals:   Blood pressure 126/85, pulse 78, temperature 98.3 F (36.8 C), temperature source Oral, resp. rate 16, height 6\' 2"  (1.88 m), weight 72.576 kg (160 lb), SpO2 98.00%. Body mass index is 20.53 kg/(m^2). Lab Results:   No results found for this or any previous visit (from the past 72 hour(s)).  Physical Findings: AIMS: Facial and Oral Movements Muscles of Facial Expression: None, normal Lips and Perioral Area: None, normal Jaw: None, normal Tongue: None, normal,Extremity Movements Upper (arms, wrists, hands, fingers): None, normal Lower (legs, knees, ankles, toes): None, normal, Trunk Movements Neck, shoulders, hips: None, normal, Overall Severity Severity of abnormal movements (highest score from questions above): None, normal Incapacitation due to abnormal movements: None, normal Patient's awareness of abnormal  movements (rate only patient's report): No Awareness, Dental Status Current problems with teeth and/or dentures?: No Does patient usually wear  dentures?: No  CIWA:  CIWA-Ar Total: 0 COWS:     Psychiatric Specialty Exam: See Psychiatric Specialty Exam and Suicide Risk Assessment completed by Attending Physician prior to discharge.  Discharge destination:  Home  Is patient on multiple antipsychotic therapies at discharge:  No   Has Patient had three or more failed trials of antipsychotic monotherapy by history:  No  Recommended Plan for Multiple Antipsychotic Therapies: NA  Discharge Orders   Future Orders Complete By Expires   Activity as tolerated - No restrictions  As directed    Diet - low sodium heart healthy  As directed        Medication List       Indication   FLUoxetine 20 MG tablet  Commonly known as:  PROZAC  Take 1 tablet (20 mg total) by mouth daily.   Indication:  Major Depressive Disorder     hydrOXYzine 25 MG tablet  Commonly known as:  ATARAX/VISTARIL  Take 1 tablet (25 mg total) by mouth every 6 (six) hours as needed for anxiety (or CIWA score </= 10).   Indication:  Anxiety Neurosis     traZODone 50 MG tablet  Commonly known as:  DESYREL  Take 1 tablet (50 mg total) by mouth at bedtime as needed for sleep.   Indication:  Trouble Sleeping        Follow-up recommendations:  Activity:  as tolerated Diet:  low-sodium heart healthy diet Continue to work your relapse prevention plan Continue to work on the lifestyle changes that could better help you manage the anxiety and mood disorder Comments:  Patient will continue his care at his outpatient appointment.  Total Discharge Time:  Greater than 30 minutes.  SignedNanine Means, PMH-NP 09/13/2013, 9:20 AM Agree with assessment and plna Irivng A. Dub Mikes, M.D.

## 2013-09-13 NOTE — Progress Notes (Signed)
[facility] Adult Case Management Discharge Plan :  Will you be returning to the same living situation after discharge: No.Pt planning to live at [facility].  At discharge, do you have transportation home?:Yes,  bus pass/part bus money Do you have the ability to pay for your medications:Yes,  mental health  Release of information consent forms completed and in the chart;  Patient's signature needed at discharge.  Patient to Follow up at: Follow-up Information   Follow up with [facility]. (Walk in between 8am-9am Monday through Friday for hospital follow-up/medication management/assessment for therapy services. )    Contact information:   [address], [place] [address] Phone: [phone] Fax: [phone]      Patient denies SI/HI:   Yes,  during group/self report.    Safety Planning and Suicide Prevention discussed:  Yes,  pt refused to consent to family contact. SPE completed with pt and he was provided with SPI pamphlet, encouraged to ask questions, talk about concerns, and share information with his support network.   Smart, Lisseth Brazeau 09/13/2013, 10:44 AM

## 2013-09-13 NOTE — BHH Suicide Risk Assessment (Signed)
Suicide Risk Assessment  Discharge Assessment     Demographic Factors:  Male  Mental Status Per Nursing Assessment::   On Admission:  Suicidal ideation indicated by patient  Current Mental Status by Physician: In full contact with reality. There are no suicidal ideas, plans or intent. His mood is euthymic, his affect is appropriate. States that after he was here last he went to court his charges were taken care of, for what he does not have that hanging over his head. He has contacted two half way houses and wants to go this route. They are going to be flexible in terms of his payment. Plans to look for work, go to [employer], work his relapse prevention plan   Loss Factors: NA  Historical Factors: NA  Risk Reduction Factors:   wnats to do better, no legal charges pending, will have the support and the structute at the half way house  Continued Clinical Symptoms:  Alcohol/Substance Abuse/Dependencies  Cognitive Features That Contribute To Risk:  Closed-mindedness Polarized thinking Thought constriction (tunnel vision)    Suicide Risk:  Minimal: No identifiable suicidal ideation.  Patients presenting with no risk factors but with morbid ruminations; may be classified as minimal risk based on the severity of the depressive symptoms  Discharge Diagnoses:   AXIS I:  Substance Induced Mood Disorder, Alcohol Dependence AXIS II:  Deferred AXIS III:   Past Medical History  Diagnosis Date  . Alcohol abuse   . Depression    AXIS IV:  other psychosocial or environmental problems AXIS V:  61-70 mild symptoms  Plan Of Care/Follow-up recommendations:  Activity:  as tolerated Diet:  regular Follow up outpatient basis/AA Is patient on multiple antipsychotic therapies at discharge:  No   Has Patient had three or more failed trials of antipsychotic monotherapy by history:  No  Recommended Plan for Multiple Antipsychotic Therapies: NA  Milen Lengacher A 09/13/2013, 12:02 PM

## 2013-09-13 NOTE — Progress Notes (Signed)
Patient did attend the evening speaker AA meeting. Pt remained in his room and conversed with his roommate.

## 2013-09-14 ENCOUNTER — Encounter (HOSPITAL_COMMUNITY): Payer: Self-pay | Admitting: Emergency Medicine

## 2013-09-14 ENCOUNTER — Emergency Department (HOSPITAL_COMMUNITY)
Admission: EM | Admit: 2013-09-14 | Discharge: 2013-09-14 | Disposition: A | Discharge status: Home / Self Care | Payer: Federal, State, Local not specified - Other | Attending: Emergency Medicine | Admitting: Emergency Medicine

## 2013-09-14 DIAGNOSIS — F3289 Other specified depressive episodes: Secondary | ICD-10-CM | POA: Insufficient documentation

## 2013-09-14 DIAGNOSIS — F1010 Alcohol abuse, uncomplicated: Secondary | ICD-10-CM | POA: Insufficient documentation

## 2013-09-14 DIAGNOSIS — F1994 Other psychoactive substance use, unspecified with psychoactive substance-induced mood disorder: Secondary | ICD-10-CM

## 2013-09-14 DIAGNOSIS — F17200 Nicotine dependence, unspecified, uncomplicated: Secondary | ICD-10-CM | POA: Insufficient documentation

## 2013-09-14 DIAGNOSIS — Z79899 Other long term (current) drug therapy: Secondary | ICD-10-CM | POA: Insufficient documentation

## 2013-09-14 DIAGNOSIS — R45851 Suicidal ideations: Secondary | ICD-10-CM | POA: Insufficient documentation

## 2013-09-14 DIAGNOSIS — F329 Major depressive disorder, single episode, unspecified: Secondary | ICD-10-CM | POA: Insufficient documentation

## 2013-09-14 DIAGNOSIS — F1020 Alcohol dependence, uncomplicated: Secondary | ICD-10-CM

## 2013-09-14 LAB — CBC
HCT: 43.2 % (ref 39.0–52.0)
MCH: 27.6 pg (ref 26.0–34.0)
MCHC: 33.1 g/dL (ref 30.0–36.0)
MCV: 83.2 fL (ref 78.0–100.0)
Platelets: 202 10*3/uL (ref 150–400)
RBC: 5.19 MIL/uL (ref 4.22–5.81)

## 2013-09-14 LAB — COMPREHENSIVE METABOLIC PANEL
ALT: 45 U/L (ref 0–53)
AST: 44 U/L — ABNORMAL HIGH (ref 0–37)
BUN: 12 mg/dL (ref 6–23)
CO2: 24 mEq/L (ref 19–32)
Calcium: 9.3 mg/dL (ref 8.4–10.5)
Creatinine, Ser: 0.78 mg/dL (ref 0.50–1.35)
GFR calc Af Amer: >90 mL/min (ref >90)
GFR calc non Af Amer: >90 mL/min (ref >90)
Glucose, Bld: 89 mg/dL (ref 70–99)
Sodium: 142 mEq/L (ref 135–145)
Total Protein: 7.8 g/dL (ref 6.0–8.3)

## 2013-09-14 LAB — RAPID URINE DRUG SCREEN, HOSP PERFORMED
Benzodiazepines: POSITIVE — AB
Cocaine: NOT DETECTED
Opiates: NOT DETECTED
Tetrahydrocannabinol: NOT DETECTED

## 2013-09-14 MED ORDER — LORAZEPAM 1 MG PO TABS: 1.0000 mg | ORAL_TABLET | Freq: Three times a day (TID) | ORAL | Status: DC | PRN

## 2013-09-14 MED ORDER — IBUPROFEN 200 MG PO TABS: 600.0000 mg | ORAL_TABLET | Freq: Three times a day (TID) | ORAL | Status: DC | PRN

## 2013-09-14 MED ORDER — ZOLPIDEM TARTRATE 5 MG PO TABS: 5.0000 mg | ORAL_TABLET | Freq: Every evening | ORAL | Status: DC | PRN

## 2013-09-14 MED ORDER — ONDANSETRON HCL 4 MG PO TABS: 4.0000 mg | ORAL_TABLET | Freq: Three times a day (TID) | ORAL | Status: DC | PRN

## 2013-09-14 MED ORDER — ALUM & MAG HYDROXIDE-SIMETH 200-200-20 MG/5ML PO SUSP: 30.0000 mL | ORAL | Status: DC | PRN

## 2013-09-14 MED ORDER — NICOTINE 21 MG/24HR TD PT24: 21.0000 mg | MEDICATED_PATCH | Freq: Every day | TRANSDERMAL | Status: DC

## 2013-09-14 MED ORDER — ACETAMINOPHEN 325 MG PO TABS: 650.0000 mg | ORAL_TABLET | ORAL | Status: DC | PRN

## 2013-09-14 NOTE — ED Notes (Signed)
Pt states he is here for detox from alcohol  Pt states he went to [facility] and was sent here for medical clearance  Pt states he drinks daily and has been drinking for the past 25 years

## 2013-09-14 NOTE — ED Provider Notes (Signed)
CSN: [id number]     Arrival date & time 09/14/13  0139 History   First MD Initiated Contact with Patient 09/14/13 0154     Chief Complaint  Patient presents with  . Medical Clearance   (Consider location/radiation/quality/duration/timing/severity/associated sxs/prior Treatment) HPI Comments: Patient is a 42 year old male with a history of alcohol abuse and depression who presents to the emergency department today for detox from alcohol. Patient states that he was discharged from behavioral health this morning and, since this time, has drank 18 beers and one half of a fifth of whiskey. Patient requesting placement in alcohol treatment facility. He states he tried being accepted at 3 facilities prior to arrival but was told he needed to be medically cleared before he could be treated. Patient states that he has had persistence of suicidal ideations and attempted to walk into traffic to kill himself today. The patient's suicidal thoughts appear to be more passive in nature, patient does state that he will try to kill himself if discharged. Patient denies homicidal ideations as well as illicit drug use. He has taken his prescribed antidepressant medications today. Patient denies a history of seizures from alcohol withdrawal.  The history is provided by the patient. No language interpreter was used.    Past Medical History  Diagnosis Date  . Alcohol abuse   . Depression    History reviewed. No pertinent past surgical history. History reviewed. No pertinent family history. History  Substance Use Topics  . Smoking status: Current Every Day Smoker -- 1.00 packs/day    Types: Cigarettes  . Smokeless tobacco: Not on file  . Alcohol Use: 7.2 oz/week    12 Cans of beer per week     Comment: a case a day; pt told this writer 12 pk daily or a fifth of vodka    Review of Systems  Psychiatric/Behavioral: Positive for suicidal ideas and behavioral problems.  All other systems reviewed and are  negative.    Allergies  Review of patient's allergies indicates no known allergies.  Home Medications   Current Outpatient Rx  Name  Route  Sig  Dispense  Refill  . FLUoxetine (PROZAC) 20 MG tablet   Oral   Take 1 tablet (20 mg total) by mouth daily.   30 tablet   0   . hydrOXYzine (ATARAX/VISTARIL) 25 MG tablet   Oral   Take 1 tablet (25 mg total) by mouth every 6 (six) hours as needed for anxiety (or CIWA score </= 10).   30 tablet   0   . ibuprofen (ADVIL,MOTRIN) 200 MG tablet   Oral   Take 400 mg by mouth every 6 (six) hours as needed (tooth pain).         . traZODone (DESYREL) 50 MG tablet   Oral   Take 1 tablet (50 mg total) by mouth at bedtime as needed for sleep.   30 tablet   0    BP 110/71  Pulse 84  Temp(Src) 97.6 F (36.4 C) (Oral)  Resp 18  SpO2 99%  Physical Exam  Nursing note and vitals reviewed. Constitutional: He is oriented to person, place, and time. He appears well-developed and well-nourished. No distress.  HENT:  Head: Normocephalic and atraumatic.  Mouth/Throat: Oropharynx is clear and moist. No oropharyngeal exudate.  Eyes: Conjunctivae and EOM are normal. Pupils are equal, round, and reactive to light. No scleral icterus.  Neck: Normal range of motion. Neck supple.  Cardiovascular: Normal rate, regular rhythm and normal heart sounds.  Pulmonary/Chest: Effort normal and breath sounds normal. No respiratory distress. He has no wheezes. He has no rales.  Abdominal: Soft. He exhibits no distension. There is no tenderness.  Musculoskeletal: Normal range of motion.  Neurological: He is alert and oriented to person, place, and time.  Skin: Skin is warm and dry. No rash noted. He is not diaphoretic. No erythema. No pallor.  Psychiatric: His speech is delayed. He is withdrawn. Cognition and memory are normal. He exhibits a depressed mood (mild). He expresses suicidal (passive) ideation. He expresses no homicidal ideation. He expresses suicidal  plans (walking into traffic). He expresses no homicidal plans.    ED Course  Procedures (including critical care time) Labs Review Labs Reviewed  COMPREHENSIVE METABOLIC PANEL - Abnormal; Notable for the following:    AST 44 (*)    Total Bilirubin 0.2 (*)    All other components within normal limits  ETHANOL - Abnormal; Notable for the following:    Alcohol, Ethyl (B) 251 (*)    All other components within normal limits  URINE RAPID DRUG SCREEN (HOSP PERFORMED) - Abnormal; Notable for the following:    Benzodiazepines POSITIVE (*)    All other components within normal limits  CBC   Imaging Review No results found.  EKG Interpretation   None       MDM   1. Alcohol abuse    Patient with a history of depression and alcohol abuse presents requesting alcohol detox and placement in a treatment facility. Patient states he has been to 3 alcohol treatment facilities today, but was told he needed to be medically cleared before he could be accepted. Patient endorses increased suicidal thoughts since consuming alcohol this morning. Patient with suicidal plan of walking into traffic. He denies homicidal thoughts and illicit drug use. Physical exam today is unremarkable and patient without pain complaints. Labs significant for ethanol of 251. UDS positive for benzos. Consult to TTS and ED psych hold orders placed.  Patient medically cleared and pending TTS eval.    Antony Madura, PA-C 09/14/13 [phone]

## 2013-09-14 NOTE — BHH Counselor (Signed)
This Clinical research associate spoke with pt, states he wants alcohol detox.  Pt was d/c from [facility] on 09/13/13 for alcohol detox and was provided resources for recovery houses and rehab.  Pt told this Clinical research associate that he immediately started drinking(BAL=251) again and asked the police to escort him to [facility]), they told him he needed detox before entering their program.  Pt says he called other facilities and was told that there is of 2-3 days for inpt admission. Pt says he can't wait that long and if he goes back on the street something is going to happen.  Pt says that while he was inpt with [facility] he didn't take any detox meds because he didn't feel like he needed it.  Pt says he needs to stay in the hospital until a program is available. This Clinical research associate explained that detox is not possible because he has not been drinking long enough(<24 hrs) and hospitals do not allow housing until recovery/rehab programs are obtainable and a waiting period maybe is necessary if admission is not immediate.  Upon further investigation, pt told psych ed nurse that he was SI w/plan to walk in traffic. Pt drank 18 beers and 1 pint of liquor 09/13/13.

## 2013-09-14 NOTE — ED Provider Notes (Signed)
Medical screening examination/treatment/procedure(s) were performed by non-physician practitioner and as supervising physician I was immediately available for consultation/collaboration.  Sunnie Nielsen, MD 09/14/13 [phone]

## 2013-09-14 NOTE — Consult Note (Signed)
[facility] Face-to-Face Psychiatry Consult   Reason for Consult:  Suicidal ideation and alcohol abuse Referring Physician:  EDP  Chad Sanchez is an 42 y.o. male.  Assessment: AXIS I:  Alcohol Abuse and Substance Induced Mood Disorder AXIS II:  Deferred AXIS III:   Past Medical History  Diagnosis Date  . Alcohol abuse   . Depression    AXIS IV:  other psychosocial or environmental problems and problems related to social environment AXIS V:  51-60 moderate symptoms  Plan:  No evidence of imminent risk to self or others at present.   Patient does not meet criteria for psychiatric inpatient admission. Supportive therapy provided about ongoing stressors. Discussed crisis plan, support from social network, calling 911, coming to the Emergency Department, and calling Suicide Hotline.  Subjective:   Chad Sanchez is a 42 y.o. male.  HPI:  Patient states that he had went to several long term rehab facilities yesterday hoping to get admission but was told that they had no beds.  "I had went everywhere and [facility]  was the last one by the time I had got there it was late and I had started drinking.  They told me I would have to go the hospital first for detox; I told them I had already been detox, that I had just got out of the hospital.  I guess they smelled the alcohol and said that I would still need to come back to the hospital.  I didn't want to come here. I need to get back to [facility] today.  They didn't say they didn't have no beds just said I needed to come to the hospital first."  Patient denies suicidal/homicidal ideation, psychosis, and paranoia.  "I just need to get into one of these long term rehab places."  Past Psychiatric History: Past Medical History  Diagnosis Date  . Alcohol abuse   . Depression     reports that he has been smoking Cigarettes.  He has been smoking about 1.00 pack per day. He does not have any smokeless tobacco history on file. He reports that he  drinks about 7.2 ounces of alcohol per week. He reports that he uses illicit drugs ("Crack" cocaine and Cocaine). History reviewed. No pertinent family history.         Allergies:  No Known Allergies  ACT Assessment Complete:  Yes:    Educational Status    Risk to Self: Risk to self Is patient at risk for suicide?: Yes Substance abuse history and/or treatment for substance abuse?: Yes  Risk to Others:    Abuse:    Prior Inpatient Therapy:    Prior Outpatient Therapy:    Additional Information:                    Objective: Blood pressure 110/71, pulse 84, temperature 97.6 F (36.4 C), temperature source Oral, resp. rate 18, SpO2 99.00%.There is no weight on file to calculate BMI. Results for orders placed during the hospital encounter of 09/14/13 (from the past 72 hour(s))  CBC     Status: None   Collection Time    09/14/13  2:28 AM      Result Value Range   WBC 6.1  4.0 - 10.5 K/uL   RBC 5.19  4.22 - 5.81 MIL/uL   Hemoglobin 14.3  13.0 - 17.0 g/dL   HCT 65.7  84.6 - 96.2 %   MCV 83.2  78.0 - 100.0 fL   MCH 27.6  26.0 -  34.0 pg   MCHC 33.1  30.0 - 36.0 g/dL   RDW 16.1  09.6 - 04.5 %   Platelets 202  150 - 400 K/uL  COMPREHENSIVE METABOLIC PANEL     Status: Abnormal   Collection Time    09/14/13  2:28 AM      Result Value Range   Sodium 142  135 - 145 mEq/L   Potassium 3.8  3.5 - 5.1 mEq/L   Chloride 106  96 - 112 mEq/L   CO2 24  19 - 32 mEq/L   Glucose, Bld 89  70 - 99 mg/dL   BUN 12  6 - 23 mg/dL   Creatinine, Ser 4.09  0.50 - 1.35 mg/dL   Calcium 9.3  8.4 - 81.1 mg/dL   Total Protein 7.8  6.0 - 8.3 g/dL   Albumin 4.1  3.5 - 5.2 g/dL   AST 44 (*) 0 - 37 U/L   ALT 45  0 - 53 U/L   Alkaline Phosphatase 54  39 - 117 U/L   Total Bilirubin 0.2 (*) 0.3 - 1.2 mg/dL   GFR calc non Af Amer >90  >90 mL/min   GFR calc Af Amer >90  >90 mL/min   Comment: (NOTE)     The eGFR has been calculated using the CKD EPI equation.     This calculation has not been  validated in all clinical situations.     eGFR's persistently <90 mL/min signify possible Chronic Kidney     Disease.  ETHANOL     Status: Abnormal   Collection Time    09/14/13  2:28 AM      Result Value Range   Alcohol, Ethyl (B) 251 (*) 0 - 11 mg/dL   Comment:            LOWEST DETECTABLE LIMIT FOR     SERUM ALCOHOL IS 11 mg/dL     FOR MEDICAL PURPOSES ONLY  URINE RAPID DRUG SCREEN (HOSP PERFORMED)     Status: Abnormal   Collection Time    09/14/13  2:46 AM      Result Value Range   Opiates NONE DETECTED  NONE DETECTED   Cocaine NONE DETECTED  NONE DETECTED   Benzodiazepines POSITIVE (*) NONE DETECTED   Amphetamines NONE DETECTED  NONE DETECTED   Tetrahydrocannabinol NONE DETECTED  NONE DETECTED   Barbiturates NONE DETECTED  NONE DETECTED   Comment:            DRUG SCREEN FOR MEDICAL PURPOSES     ONLY.  IF CONFIRMATION IS NEEDED     FOR ANY PURPOSE, NOTIFY LAB     WITHIN 5 DAYS.                LOWEST DETECTABLE LIMITS     FOR URINE DRUG SCREEN     Drug Class       Cutoff (ng/mL)     Amphetamine      1000     Barbiturate      200     Benzodiazepine   200     Tricyclics       300     Opiates          300     Cocaine          300     THC              50     Current Facility-Administered Medications  Medication Dose Route Frequency  Provider Last Rate Last Dose  . acetaminophen (TYLENOL) tablet 650 mg  650 mg Oral Q4H PRN Antony Madura, PA-C      . alum & mag hydroxide-simeth (MAALOX/MYLANTA) 200-200-20 MG/5ML suspension 30 mL  30 mL Oral PRN Antony Madura, PA-C      . ibuprofen (ADVIL,MOTRIN) tablet 600 mg  600 mg Oral Q8H PRN Antony Madura, PA-C      . LORazepam (ATIVAN) tablet 1 mg  1 mg Oral Q8H PRN Antony Madura, PA-C      . nicotine (NICODERM CQ - dosed in mg/24 hours) patch 21 mg  21 mg Transdermal Daily Antony Madura, PA-C      . ondansetron [facility]) tablet 4 mg  4 mg Oral Q8H PRN Antony Madura, PA-C      . zolpidem (AMBIEN) tablet 5 mg  5 mg Oral QHS PRN Antony Madura, PA-C        Current Outpatient Prescriptions  Medication Sig Dispense Refill  . FLUoxetine (PROZAC) 20 MG tablet Take 1 tablet (20 mg total) by mouth daily.  30 tablet  0  . hydrOXYzine (ATARAX/VISTARIL) 25 MG tablet Take 1 tablet (25 mg total) by mouth every 6 (six) hours as needed for anxiety (or CIWA score </= 10).  30 tablet  0  . ibuprofen (ADVIL,MOTRIN) 200 MG tablet Take 400 mg by mouth every 6 (six) hours as needed (tooth pain).      . traZODone (DESYREL) 50 MG tablet Take 1 tablet (50 mg total) by mouth at bedtime as needed for sleep.  30 tablet  0    Psychiatric Specialty Exam:     Blood pressure 110/71, pulse 84, temperature 97.6 F (36.4 C), temperature source Oral, resp. rate 18, SpO2 99.00%.There is no weight on file to calculate BMI.  General Appearance: Casual  Eye Contact::  Good  Speech:  Clear and Coherent and Normal Rate  Volume:  Normal  Mood:  Anxious  Affect:  Congruent  Thought Process:  Circumstantial and Goal Directed  Orientation:  Full (Time, Place, and Person)  Thought Content:  "I feel good. I just need rehab"  Suicidal Thoughts:  No  Homicidal Thoughts:  No  Memory:  Immediate;   Good Recent;   Good  Judgement:  Good  Insight:  Fair and Present  Psychomotor Activity:  Normal  Concentration:  Good  Recall:  Good  Akathisia:  No  Handed:  Right  AIMS (if indicated):     Assets:  Communication Skills Desire for Improvement  Sleep:      Face to face interview/consult with Dr. Ladona Ridgel  Treatment Plan Summary: Follow up with outpatient provider  Disposition:  Discharge home to follow up with rehab facilities and primary outpatient provider  Assunta Found, FNP-BC 09/14/2013 11:14 AM

## 2013-09-14 NOTE — Progress Notes (Signed)
Writer consulted with the Psychiatrist (Dr. Ladona Ridgel) and the NP [facility]) regarding the patient not meeting criteria for inpatient hospitalization.   Writer provided outpatient referrals for the patient for substance abuse, medication management and outpatient therapy.  Writer informed the ER MD (Dr. Effie Shy) and the nurse working with the patient.   Writer informed the [facility] Office so the patients information can be updated on the log

## 2013-09-14 NOTE — ED Notes (Signed)
Pt transferred from triage, presents intoxicated requesting detox for alcohol abuse and SI with plan to walk in front of traffic.  Pt reports he drank 18 cans of beer and 1 pint of liquor yesterday, went to [facility] for 1 year Detox program and was declined.  Pt calm & cooperative at present.

## 2013-09-15 NOTE — Consult Note (Signed)
Note reviewed and agreed with  

## 2013-09-17 NOTE — Progress Notes (Signed)
Patient Discharge Instructions:  After Visit Summary (AVS):   Faxed to:  09/17/13 Discharge Summary Note:   Faxed to:  09/17/13 Psychiatric Admission Assessment Note:   Faxed to:  09/17/13 Suicide Risk Assessment - Discharge Assessment:   Faxed to:  09/17/13 Faxed/Sent to the Next Level Care provider:  09/17/13 Faxed to [facility] @ [phone]  Jerelene Redden, 09/17/2013, 3:02 PM

## 2013-09-19 ENCOUNTER — Emergency Department (HOSPITAL_COMMUNITY)
Admission: EM | Admit: 2013-09-19 | Discharge: 2013-09-20 | Disposition: A | Discharge status: Home / Self Care | Payer: Self-pay | Attending: Emergency Medicine | Admitting: Emergency Medicine

## 2013-09-19 ENCOUNTER — Encounter (HOSPITAL_COMMUNITY): Payer: Self-pay | Admitting: Emergency Medicine

## 2013-09-19 DIAGNOSIS — Z79899 Other long term (current) drug therapy: Secondary | ICD-10-CM | POA: Insufficient documentation

## 2013-09-19 DIAGNOSIS — R209 Unspecified disturbances of skin sensation: Secondary | ICD-10-CM | POA: Insufficient documentation

## 2013-09-19 DIAGNOSIS — F1020 Alcohol dependence, uncomplicated: Secondary | ICD-10-CM

## 2013-09-19 DIAGNOSIS — F1994 Other psychoactive substance use, unspecified with psychoactive substance-induced mood disorder: Secondary | ICD-10-CM

## 2013-09-19 DIAGNOSIS — F329 Major depressive disorder, single episode, unspecified: Secondary | ICD-10-CM | POA: Insufficient documentation

## 2013-09-19 DIAGNOSIS — F3289 Other specified depressive episodes: Secondary | ICD-10-CM | POA: Insufficient documentation

## 2013-09-19 DIAGNOSIS — F17200 Nicotine dependence, unspecified, uncomplicated: Secondary | ICD-10-CM | POA: Insufficient documentation

## 2013-09-19 DIAGNOSIS — M79609 Pain in unspecified limb: Secondary | ICD-10-CM | POA: Insufficient documentation

## 2013-09-19 DIAGNOSIS — R45851 Suicidal ideations: Secondary | ICD-10-CM | POA: Insufficient documentation

## 2013-09-19 MED ORDER — IBUPROFEN 800 MG PO TABS
800.0000 mg | ORAL_TABLET | Freq: Once | ORAL | Status: AC
  Administered 2013-09-20: 800 mg via ORAL
  Filled 2013-09-19: qty 1

## 2013-09-19 NOTE — ED Notes (Signed)
Pt states he wants detox.  Pt wants to go somewhere long term.  Every time he is sent home, he cannot get into a facility.  Pt is also stating that he wants to walk in front of a car to end everything.

## 2013-09-19 NOTE — ED Provider Notes (Signed)
CSN: [id number]     Arrival date & time 09/19/13  2306 History  This chart was scribed for non-physician practitioner, Arthor Captain, PA-C,working with Hanley Seamen, MD, by Karle Plumber, ED Scribe.  This patient was seen in room WTR5/WTR5 and the patient's care was started at 11:31 PM.  Chief Complaint  Patient presents with  . Medical Clearance   The history is provided by the patient. No language interpreter was used.   HPI Comments:  Chad Sanchez is a 42 y.o. male brought in by [employer] who presents to the Emergency Department complaining of medical clearance. Pt states he has presented here to the ED in the past for the same issue of heavy drinking. He states he drinks as much as he can obtain. He states he was trying to kill himself tonight by walking in front of cars. He states he was put up for adoption at 30 weeks old because his mother was an alcoholic. He states he has been drinking since he was 42 years old. He states he has not been taking his medicine, Prozac and Trazodone, for a few months. He reports his last drink was approximately 2.5 hours ago. He reports calling the police to bring him in. He describes the pain as sore and prevents him from walking normally. Pt states he feels as if he is at "the end point" now. He denies taking any other drugs today. He reports smoking cocaine two days ago. He reports smoking cocaine occasionally.  He complains of severe, constant bilateral leg pain. He states he has some numbness in three of his fingers and the front part of his bilateral legs. Pt appears tearful.   Past Medical History  Diagnosis Date  . Alcohol abuse   . Depression    History reviewed. No pertinent past surgical history. History reviewed. No pertinent family history. History  Substance Use Topics  . Smoking status: Current Every Day Smoker -- 1.00 packs/day    Types: Cigarettes  . Smokeless tobacco: Not on file  . Alcohol Use: 7.2 oz/week    12 Cans of beer  per week     Comment: a case a day; pt told this writer 12 pk daily or a fifth of vodka    Review of Systems  Musculoskeletal:       Bilateral leg pain.  Psychiatric/Behavioral: Positive for suicidal ideas.    Allergies  Review of patient's allergies indicates no known allergies.  Home Medications   Current Outpatient Rx  Name  Route  Sig  Dispense  Refill  . FLUoxetine (PROZAC) 20 MG tablet   Oral   Take 1 tablet (20 mg total) by mouth daily.   30 tablet   0   . hydrOXYzine (ATARAX/VISTARIL) 25 MG tablet   Oral   Take 1 tablet (25 mg total) by mouth every 6 (six) hours as needed for anxiety (or CIWA score </= 10).   30 tablet   0   . ibuprofen (ADVIL,MOTRIN) 200 MG tablet   Oral   Take 400 mg by mouth every 6 (six) hours as needed (tooth pain).         . traZODone (DESYREL) 50 MG tablet   Oral   Take 1 tablet (50 mg total) by mouth at bedtime as needed for sleep.   30 tablet   0    Triage Vitals:BP 123/74  Pulse 109  Temp(Src) 98 F (36.7 C) (Oral)  Resp 16 Physical Exam  Nursing note and vitals  reviewed. Constitutional: He is oriented to person, place, and time. He appears well-developed and well-nourished. No distress.  tearful  HENT:  Head: Normocephalic and atraumatic.  Eyes: Conjunctivae are normal. No scleral icterus.  Neck: Neck supple.  Cardiovascular: Normal rate and intact distal pulses.   Pulmonary/Chest: Effort normal. No stridor. No respiratory distress.  Abdominal: Normal appearance. He exhibits no distension.  Musculoskeletal:  Antalgic gait.   Neurological: He is alert and oriented to person, place, and time.  Skin: Skin is warm and dry. No rash noted.  Psychiatric: He has a normal mood and affect. His behavior is normal.    ED Course  Procedures (including critical care time) DIAGNOSTIC STUDIES: Oxygen Saturation is 95% on room air adequate by my interpretation.  BP 127/69  Pulse 86  Temp(Src) 98.4 F (36.9 C) (Oral)  Resp 16   SpO2 95%  COORDINATION OF CARE: 11:41 PM- Will contact resources for pt to speak with. Pt verbalizes understanding and agrees to plan.  Medications  ibuprofen (ADVIL,MOTRIN) tablet 800 mg (not administered)   Labs Review Labs Reviewed - No data to display Imaging Review No results found.  EKG Interpretation   None       MDM  2 1. Alcohol dependence   2. Substance induced mood disorder    Patient with alcohol dependence and suicidal behavior. Patient with mild hperglycemia, ETOH elevated. Placed on CIWA. Consult to TTS as patient wants placement in rehab.  I personally performed the services described in this documentation, which was scribed in my presence. The recorded information has been reviewed and is accurate.     Arthor Captain, PA-C 09/21/13 1224

## 2013-09-19 NOTE — ED Notes (Signed)
Bed: WTR5 Expected date:  Expected time:  Means of arrival:  Comments: 

## 2013-09-20 DIAGNOSIS — F1020 Alcohol dependence, uncomplicated: Secondary | ICD-10-CM

## 2013-09-20 DIAGNOSIS — F1994 Other psychoactive substance use, unspecified with psychoactive substance-induced mood disorder: Secondary | ICD-10-CM

## 2013-09-20 LAB — CBC WITH DIFFERENTIAL/PLATELET
Basophils Absolute: 0 10*3/uL (ref 0.0–0.1)
Basophils Relative: 0 % (ref 0–1)
Eosinophils Absolute: 0.1 10*3/uL (ref 0.0–0.7)
Eosinophils Relative: 3 % (ref 0–5)
HCT: 40.3 % (ref 39.0–52.0)
Hemoglobin: 13.7 g/dL (ref 13.0–17.0)
Lymphocytes Relative: 40 % (ref 12–46)
MCH: 27.7 pg (ref 26.0–34.0)
MCHC: 34 g/dL (ref 30.0–36.0)
MCV: 81.6 fL (ref 78.0–100.0)
Monocytes Absolute: 0.5 10*3/uL (ref 0.1–1.0)
Monocytes Relative: 12 % (ref 3–12)
Neutrophils Relative %: 45 % (ref 43–77)
Platelets: 216 10*3/uL (ref 150–400)
RDW: 15.1 % (ref 11.5–15.5)

## 2013-09-20 LAB — URINALYSIS, ROUTINE W REFLEX MICROSCOPIC
Glucose, UA: NEGATIVE mg/dL
Ketones, ur: NEGATIVE mg/dL
Leukocytes, UA: NEGATIVE
Protein, ur: NEGATIVE mg/dL
Specific Gravity, Urine: 1.015 (ref 1.005–1.030)

## 2013-09-20 LAB — RAPID URINE DRUG SCREEN, HOSP PERFORMED
Amphetamines: NOT DETECTED
Cocaine: NOT DETECTED
Opiates: NOT DETECTED

## 2013-09-20 LAB — COMPREHENSIVE METABOLIC PANEL
AST: 33 U/L (ref 0–37)
Albumin: 4.1 g/dL (ref 3.5–5.2)
BUN: 13 mg/dL (ref 6–23)
CO2: 21 mEq/L (ref 19–32)
Calcium: 9.1 mg/dL (ref 8.4–10.5)
Chloride: 100 mEq/L (ref 96–112)
Creatinine, Ser: 0.97 mg/dL (ref 0.50–1.35)
GFR calc non Af Amer: >90 mL/min (ref >90)
Sodium: 138 mEq/L (ref 135–145)
Total Bilirubin: 0.2 mg/dL — ABNORMAL LOW (ref 0.3–1.2)
Total Protein: 7.9 g/dL (ref 6.0–8.3)

## 2013-09-20 MED ORDER — ALUM & MAG HYDROXIDE-SIMETH 200-200-20 MG/5ML PO SUSP: 30.0000 mL | ORAL | Status: DC | PRN

## 2013-09-20 MED ORDER — NICOTINE 21 MG/24HR TD PT24: 21.0000 mg | MEDICATED_PATCH | Freq: Every day | TRANSDERMAL | Status: DC

## 2013-09-20 MED ORDER — THIAMINE HCL 100 MG/ML IJ SOLN: 100.0000 mg | Freq: Every day | INTRAMUSCULAR | Status: DC

## 2013-09-20 MED ORDER — ONDANSETRON HCL 4 MG PO TABS: 4.0000 mg | ORAL_TABLET | Freq: Three times a day (TID) | ORAL | Status: DC | PRN

## 2013-09-20 MED ORDER — LORAZEPAM 2 MG/ML IJ SOLN: 1.0000 mg | Freq: Four times a day (QID) | INTRAMUSCULAR | Status: DC | PRN

## 2013-09-20 MED ORDER — LORAZEPAM 1 MG PO TABS: 1.0000 mg | ORAL_TABLET | Freq: Four times a day (QID) | ORAL | Status: DC | PRN

## 2013-09-20 MED ORDER — VITAMIN B-1 100 MG PO TABS
100.0000 mg | ORAL_TABLET | Freq: Every day | ORAL | Status: DC
  Administered 2013-09-20: 100 mg via ORAL
  Filled 2013-09-20: qty 1

## 2013-09-20 MED ORDER — FOLIC ACID 1 MG PO TABS
1.0000 mg | ORAL_TABLET | Freq: Every day | ORAL | Status: DC
  Administered 2013-09-20: 1 mg via ORAL
  Filled 2013-09-20: qty 1

## 2013-09-20 MED ORDER — ADULT MULTIVITAMIN W/MINERALS CH
1.0000 | ORAL_TABLET | Freq: Every day | ORAL | Status: DC
  Administered 2013-09-20: 1 via ORAL
  Filled 2013-09-20: qty 1

## 2013-09-20 MED ORDER — SODIUM CHLORIDE 0.9 % IV SOLN
Freq: Once | INTRAVENOUS | Status: AC
  Administered 2013-09-20: 05:00:00 via INTRAVENOUS

## 2013-09-20 NOTE — Consult Note (Signed)
  Contacted by TTS Rosey Bath) who assessed pt recently (11/7-11/10 )detoxed at [facility] where he claimed he was "serious this time" (about wanting to get sober)H failed to FU with recommendations and in fact retrned to the ED Nov 11 c/o intoxication.He has failed to follow thru on any plan and has been kicked out of 1 treatment program for bringing his girlfriend. He  has a court date 11/18 for child support.He is now claiming to be suicidal per TTS note. He is intoxicated on admission with a 299 ETOH level . Do not recommend admission at this time. Recommend observation in Psych ED and reassess when sober.Would recommend his court date be kept if suicide risk assessment ok.

## 2013-09-20 NOTE — Progress Notes (Signed)
[facility] CL spoke with patient about [facility] on several different occasions. Spoke with patient today about application and patient stated that he had an apt at the [facility] on Wednesday for E/E with [employer].

## 2013-09-20 NOTE — BHH Counselor (Signed)
Pt states he has a bed waiting for him at [facility] in [facility]. However, there are five [facility] [address] in [place]. Writer called [facility] and none of them have a bed waiting for him. Pt then states that he has a bed at [facility] instead of [facility]. Education officer, environmental for [employer]. Pt then asks to be d/c home with bus pass. Lyla Son RN gave him bus pass and Lyla Son will call EDP re: d/c patient.  Evette Cristal, [place] Assessment Counselor

## 2013-09-20 NOTE — ED Notes (Signed)
Pt was given bus pass and $5 from [place] to get pt home to get clothes and money to get to [facility] to a detox facility.

## 2013-09-20 NOTE — ED Notes (Addendum)
Pt's belongings contained in 3 bags. Placed in TCU locker # 29  Bag 1 1 pair of silver and red tennis shoes with laces missing 1 charcoal colored jacket  Bag 2 1 pair of light black pants with a brown belt 1 pair of white socks  Bag 3 1 green lighter in a biohazard bag 1 grey hooded sweatshirt 1 dark grey shirt 1 grey sweatshirt

## 2013-09-20 NOTE — Consult Note (Signed)
[facility] Face-to-Face Psychiatry Consult   Reason for Consult:  ALCOHOL DEPENDENCE, COCAINE DEPENDENCE Referring Physician:  EDP Chad Sanchez is an 42 y.o. male.  Assessment: AXIS I:  Major Depression, Recurrent severe, Substance Abuse and Alcohol dependence, Cocaine dependence AXIS II:  Deferred AXIS III:   Past Medical History  Diagnosis Date  . Alcohol abuse   . Depression    AXIS IV:  economic problems, housing problems, occupational problems, other psychosocial or environmental problems, problems related to legal system/crime, problems related to social environment and problems with primary support group AXIS V:  61-70 mild symptoms  Plan:  No evidence of imminent risk to self or others at present.    Subjective:   Chad Sanchez is a 42 y.o. male patient evaluated for  Alcohol intoxication and dependence.  HPI: This is another visit for this patient for alcohol and cocaine abuse.  Patient was detox from alcohol and cocaine in  our in patient unit.  Patient states he relapsed Saturday and drank all day Sunday too.  His alcohol level was 299 on arrival to the ER.  Patient is calm and denies Suicide at this time.  Patient states he relapsed because he was not able to move in at [facility] as planned.  Patient states he will be moving at [facility] today because he was promised a room.  He denies any shakes and there is no tremor of his fingers noted.  Patient is alert and oriented x3, he denies SI/HI/AVH.  Patient states he is interested in moving into [facility] and participate in outpatient rehabilitation facility near [facility].  Patient states he is sleeping and eating well and does not want to miss his chances of securing a place at [facility].  We will discharge patient with outpatient resources.  Patient agrees to this plan of care.  HPI Elements:   Location:  [facility]. Quality:  MODERATE.  Past Psychiatric History: Past Medical History  Diagnosis Date  .  Alcohol abuse   . Depression     reports that he has been smoking Cigarettes.  He has been smoking about 1.00 pack per day. He does not have any smokeless tobacco history on file. He reports that he drinks about 7.2 ounces of alcohol per week. He reports that he uses illicit drugs ("Crack" cocaine and Cocaine). History reviewed. No pertinent family history. Family History Substance Abuse: No Family Supports: No Living Arrangements: Alone Can pt return to current living arrangement?: Yes Abuse/Neglect [facility]) Physical Abuse: Denies Verbal Abuse: Denies Sexual Abuse: Denies Allergies:   Allergies  Allergen Reactions  . Shellfish Allergy Anaphylaxis    ACT Assessment Complete:  Yes:    Educational Status    Risk to Self: Risk to self Suicidal Ideation: Yes-Currently Present Suicidal Intent: Yes-Currently Present Is patient at risk for suicide?: No Suicidal Plan?: Yes-Currently Present Specify Current Suicidal Plan: Pt says he was walking in front of traffic  Access to Means: Yes Specify Access to Suicidal Means: Traffic/Environmental  What has been your use of drugs/alcohol within the last 12 months?: Abusing: alcohol/cocaine  Previous Attempts/Gestures: No How many times?: 0 Other Self Harm Risks: None  Triggers for Past Attempts: None known Intentional Self Injurious Behavior: None Comment - Self Injurious Behavior: None  Family Suicide History: No Recent stressful life event(s): Other (Comment) (Chronic SA ) Persecutory voices/beliefs?: No Depression: Yes Depression Symptoms: Loss of interest in usual pleasures;Feeling worthless/self pity Substance abuse history and/or treatment for substance abuse?: Yes  Suicide prevention information given to non-admitted patients: Not applicable  Risk to Others: Risk to Others Homicidal Ideation: No Thoughts of Harm to Others: No Current Homicidal Intent: No Current Homicidal Plan: No Access to Homicidal Means: No Identified Victim:  None  History of harm to others?: No Assessment of Violence: None Noted Violent Behavior Description: None Does patient have access to weapons?: No Criminal Charges Pending?: Yes Describe Pending Criminal Charges: Per last note--larceny  Does patient have a court date: Yes Court Date:  (Per last note: November )  Abuse: Abuse/Neglect Assessment (Assessment to be complete while patient is alone) Physical Abuse: Denies Verbal Abuse: Denies Sexual Abuse: Denies Exploitation of patient/patient's resources: Denies Self-Neglect: Denies  Prior Inpatient Therapy: Prior Inpatient Therapy Prior Inpatient Therapy: Yes Prior Therapy Dates: 2014, 2013 Prior Therapy Facilty/Provider(s): [facility], [facility]  Reason for Treatment: Detox/Rehab   Prior Outpatient Therapy: Prior Outpatient Therapy Prior Outpatient Therapy: No Prior Therapy Dates: None  Prior Therapy Facilty/Provider(s): None  Reason for Treatment: None   Additional Information: Additional Information 1:1 In Past 12 Months?: No CIRT Risk: No Elopement Risk: No Does patient have medical clearance?: Yes                  Objective: Blood pressure 127/69, pulse 86, temperature 98.4 F (36.9 C), temperature source Oral, resp. rate 16, SpO2 95.00%.There is no weight on file to calculate BMI. Results for orders placed during the hospital encounter of 09/19/13 (from the past 72 hour(s))  CBC WITH DIFFERENTIAL     Status: None   Collection Time    09/19/13 12:34 AM      Result Value Range   WBC 4.6  4.0 - 10.5 K/uL   RBC 4.94  4.22 - 5.81 MIL/uL   Hemoglobin 13.7  13.0 - 17.0 g/dL   HCT 40.9  81.1 - 91.4 %   MCV 81.6  78.0 - 100.0 fL   MCH 27.7  26.0 - 34.0 pg   MCHC 34.0  30.0 - 36.0 g/dL   RDW 78.2  95.6 - 21.3 %   Platelets 216  150 - 400 K/uL   Neutrophils Relative % 45  43 - 77 %   Neutro Abs 2.0  1.7 - 7.7 K/uL   Lymphocytes Relative 40  12 - 46 %   Lymphs Abs 1.8  0.7 - 4.0 K/uL   Monocytes Relative 12  3  - 12 %   Monocytes Absolute 0.5  0.1 - 1.0 K/uL   Eosinophils Relative 3  0 - 5 %   Eosinophils Absolute 0.1  0.0 - 0.7 K/uL   Basophils Relative 0  0 - 1 %   Basophils Absolute 0.0  0.0 - 0.1 K/uL  COMPREHENSIVE METABOLIC PANEL     Status: Abnormal   Collection Time    09/19/13 12:34 AM      Result Value Range   Sodium 138  135 - 145 mEq/L   Potassium 3.6  3.5 - 5.1 mEq/L   Chloride 100  96 - 112 mEq/L   CO2 21  19 - 32 mEq/L   Glucose, Bld 109 (*) 70 - 99 mg/dL   BUN 13  6 - 23 mg/dL   Creatinine, Ser 0.86  0.50 - 1.35 mg/dL   Calcium 9.1  8.4 - 57.8 mg/dL   Total Protein 7.9  6.0 - 8.3 g/dL   Albumin 4.1  3.5 - 5.2 g/dL   AST 33  0 - 37 U/L   ALT  25  0 - 53 U/L   Alkaline Phosphatase 61  39 - 117 U/L   Total Bilirubin 0.2 (*) 0.3 - 1.2 mg/dL   GFR calc non Af Amer >90  >90 mL/min   GFR calc Af Amer >90  >90 mL/min   Comment: (NOTE)     The eGFR has been calculated using the CKD EPI equation.     This calculation has not been validated in all clinical situations.     eGFR's persistently <90 mL/min signify possible Chronic Kidney     Disease.  ETHANOL     Status: Abnormal   Collection Time    09/19/13 12:34 AM      Result Value Range   Alcohol, Ethyl (B) 299 (*) 0 - 11 mg/dL   Comment:            LOWEST DETECTABLE LIMIT FOR     SERUM ALCOHOL IS 11 mg/dL     FOR MEDICAL PURPOSES ONLY  URINALYSIS, ROUTINE W REFLEX MICROSCOPIC     Status: None   Collection Time    09/19/13 11:47 PM      Result Value Range   Color, Urine YELLOW  YELLOW   APPearance CLEAR  CLEAR   Specific Gravity, Urine 1.015  1.005 - 1.030   pH 5.5  5.0 - 8.0   Glucose, UA NEGATIVE  NEGATIVE mg/dL   Hgb urine dipstick NEGATIVE  NEGATIVE   Bilirubin Urine NEGATIVE  NEGATIVE   Ketones, ur NEGATIVE  NEGATIVE mg/dL   Protein, ur NEGATIVE  NEGATIVE mg/dL   Urobilinogen, UA 0.2  0.0 - 1.0 mg/dL   Nitrite NEGATIVE  NEGATIVE   Leukocytes, UA NEGATIVE  NEGATIVE   Comment: MICROSCOPIC NOT DONE ON URINES  WITH NEGATIVE PROTEIN, BLOOD, LEUKOCYTES, NITRITE, OR GLUCOSE <1000 mg/dL.  URINE RAPID DRUG SCREEN (HOSP PERFORMED)     Status: None   Collection Time    09/19/13 11:47 PM      Result Value Range   Opiates NONE DETECTED  NONE DETECTED   Cocaine NONE DETECTED  NONE DETECTED   Benzodiazepines NONE DETECTED  NONE DETECTED   Amphetamines NONE DETECTED  NONE DETECTED   Tetrahydrocannabinol NONE DETECTED  NONE DETECTED   Barbiturates NONE DETECTED  NONE DETECTED   Comment:            DRUG SCREEN FOR MEDICAL PURPOSES     ONLY.  IF CONFIRMATION IS NEEDED     FOR ANY PURPOSE, NOTIFY LAB     WITHIN 5 DAYS.                LOWEST DETECTABLE LIMITS     FOR URINE DRUG SCREEN     Drug Class       Cutoff (ng/mL)     Amphetamine      1000     Barbiturate      200     Benzodiazepine   200     Tricyclics       300     Opiates          300     Cocaine          300     THC              50   Labs are reviewed and are pertinent for Alcohol level 299, the rest of the labs are unremarkable.  Current Facility-Administered Medications  Medication Dose Route Frequency Provider Last Rate Last Dose  .  alum & mag hydroxide-simeth (MAALOX/MYLANTA) 200-200-20 MG/5ML suspension 30 mL  30 mL Oral PRN Arthor Captain, PA-C      . folic acid (FOLVITE) tablet 1 mg  1 mg Oral Daily Arthor Captain, PA-C   1 mg at 09/20/13 1146  . LORazepam (ATIVAN) tablet 1 mg  1 mg Oral Q6H PRN Arthor Captain, PA-C       Or  . LORazepam (ATIVAN) injection 1 mg  1 mg Intravenous Q6H PRN Arthor Captain, PA-C      . multivitamin with minerals tablet 1 tablet  1 tablet Oral Daily Arthor Captain, PA-C   1 tablet at 09/20/13 1146  . nicotine (NICODERM CQ - dosed in mg/24 hours) patch 21 mg  21 mg Transdermal Daily Arthor Captain, PA-C      . ondansetron (ZOFRAN) tablet 4 mg  4 mg Oral Q8H PRN Arthor Captain, PA-C      . thiamine (VITAMIN B-1) tablet 100 mg  100 mg Oral Daily Arthor Captain, PA-C   100 mg at 09/20/13 1146   Or  .  thiamine (B-1) injection 100 mg  100 mg Intravenous Daily Arthor Captain, PA-C       Current Outpatient Prescriptions  Medication Sig Dispense Refill  . ibuprofen (ADVIL,MOTRIN) 200 MG tablet Take 400 mg by mouth every 6 (six) hours as needed for moderate pain.       Marland Kitchen FLUoxetine (PROZAC) 20 MG tablet Take 1 tablet (20 mg total) by mouth daily.  30 tablet  0  . hydrOXYzine (ATARAX/VISTARIL) 25 MG tablet Take 1 tablet (25 mg total) by mouth every 6 (six) hours as needed for anxiety (or CIWA score </= 10).  30 tablet  0  . traZODone (DESYREL) 50 MG tablet Take 1 tablet (50 mg total) by mouth at bedtime as needed for sleep.  30 tablet  0    Psychiatric Specialty Exam:     Blood pressure 127/69, pulse 86, temperature 98.4 F (36.9 C), temperature source Oral, resp. rate 16, SpO2 95.00%.There is no weight on file to calculate BMI.  General Appearance: Casual  Eye Contact::  Good  Speech:  Clear and Coherent and Normal Rate  Volume:  Normal  Mood:  Anxious and Depressed  Affect:  Congruent and Depressed  Thought Process:  Coherent, Goal Directed and Intact  Orientation:  Full (Time, Place, and Person)  Thought Content:  NA  Suicidal Thoughts:  No  Homicidal Thoughts:  No  Memory:  Immediate;   Good Recent;   Good Remote;   Good  Judgement:  Good  Insight:  Present  Psychomotor Activity:  Normal  Concentration:  Good  Recall:  NA  Akathisia:  NA  Handed:  Right  AIMS (if indicated):     Assets:  Desire for Improvement Housing Social Support Vocational/Educational  Sleep:      Treatment Plan Summary:  Consult and face to face interview with Dr Tawni Carnes We will discharge patient to [facility] with resources for out patient rehabilitation.  We will discharge him to [facility].  Patientis in agreement with this plan  Dahlia Byes, C   PMHNP-BC 09/20/2013 1:08 PM

## 2013-09-20 NOTE — ED Notes (Signed)
Per [facility], pt not for sure where in [place] that he is supposed to be going to stay.  She has contacted a few [facility] in [place] (total of 6 in [place]) none of them has his name on their list.  She also left message with [facility] to see if that is where he is accepted, awaiting to hear back from them.

## 2013-09-20 NOTE — ED Notes (Signed)
Pt wanting to know what the delay is on his discharge.  Informed him that we have contacted [facility] and [facility] and waiting to see where he will be going. Pt states that he has a place to stay of his own that he needs to go first to get his clothes then figure out how to get to the detox place.  Made Renaldo Fiddler aware, she is coming to speak with pt.

## 2013-09-20 NOTE — BHH Counselor (Signed)
This Clinical research associate discussed interview with Maryjean Morn, PA, pt has up coming court date on 09/21/13 and is requesting long term program. Per Maryjean Morn, PA, pt is declined for services with [facility].  This Clinical research associate discussed disposition with Dr. Read Drivers, he states if pt will contract for safety, then he can be d/c'd home with referrals.

## 2013-09-20 NOTE — BH Assessment (Signed)
Assessment Note  Chad Sanchez is a 42 y.o. male who was brought in by the police(voluntarily).  Pt reports the following: pt says he is SI and was walking in traffic.  Pt has been to the emerg dept several times in the last 2 weeks requesting detox from alcohol.  Pt has been drinking heavily since his d/c from [facility] on 09/14/13; began drinking immediately after d/c.  Pt consumes 12 pk of beer, daily and also using 1 gram of cocaine wkly.  Pt.'s last use was 09/20/13, pt drank 12 pk and last used 1 gram of cocaine, 2 days ago.  Pt has past admissions with [facility], [facility] and [facility]--tells this Clinical research associate that he was kicked out of [facility] after staff found out that he arrived with his girlfriend and they were admitted together. Pt left after 6 mos in the program.  It is a 2-yr program.  Pt denies problems with seizures/blackouts and is c/o tremors.  Pt is requesting long term rehab and states he attempted to secure placement with rehab facilities but they waiting lists and he can't wait.          Axis I: Major Depression, Recurrent severe and Alcohol Dependence; Cocaine Abuse Axis II: Deferred Axis III:  Past Medical History  Diagnosis Date  . Alcohol abuse   . Depression    Axis IV: other psychosocial or environmental problems, problems related to legal system/crime, problems related to social environment and problems with primary support group Axis V: 41-50 serious symptoms  Past Medical History:  Past Medical History  Diagnosis Date  . Alcohol abuse   . Depression     History reviewed. No pertinent past surgical history.  Family History: History reviewed. No pertinent family history.  Social History:  reports that he has been smoking Cigarettes.  He has been smoking about 1.00 pack per day. He does not have any smokeless tobacco history on file. He reports that he drinks about 7.2 ounces of alcohol per week. He reports that he uses illicit drugs ("Crack" cocaine and Cocaine).  Additional  Social History:  Alcohol / Drug Use Pain Medications: See MAR  Prescriptions: See MAR  Over the Counter: See MAR  History of alcohol / drug use?: Yes Longest period of sobriety (when/how long): 6 mos  Negative Consequences of Use: Work / School;Personal relationships;Financial Withdrawal Symptoms: Tremors Substance #1 Name of Substance 1: Alcohol  1 - Age of First Use: 13 YOM  1 - Amount (size/oz): 12 pk & 1 pint  1 - Frequency: Daily  1 - Duration: On-going(Has been drinking since last d/c from [facility] 09/14/13 1 - Last Use / Amount: 09/20/13 Substance #2 Name of Substance 2: Cocaine  2 - Age of First Use: 31 YOM  2 - Amount (size/oz): 1 Gram  2 - Frequency: [place]  2 - Duration: On-going  2 - Last Use / Amount: 2 Days Ago   CIWA: CIWA-Ar BP: 123/74 mmHg Pulse Rate: 109 COWS:    Allergies:  Allergies  Allergen Reactions  . Shellfish Allergy Anaphylaxis    Home Medications:  (Not in a hospital admission)  OB/GYN Status:  No LMP for male patient.  General Assessment Data [facility] of Assessment: [facility] Is this a Tele or Face-to-Face Assessment?: Face-to-Face Is this an Initial Assessment or a Re-assessment for this encounter?: Initial Assessment Living Arrangements: Alone Can pt return to current living arrangement?: Yes Admission Status: Voluntary Is patient capable of signing voluntary admission?: Yes Transfer from: [facility]  Referral Source: MD  Medical Screening Exam [facility] Walk-in ONLY) Medical Exam completed: No Reason for MSE not completed: Other: (None )  [facility] Crisis Care Plan Living Arrangements: Alone Name of Psychiatrist: None  Name of Therapist: None   Education Status Is patient currently in school?: No Current Grade: None  Highest grade of school patient has completed: None  Name of school: None  Contact person: None   Risk to self Suicidal Ideation: Yes-Currently Present Suicidal Intent: Yes-Currently Present Is patient at risk for  suicide?: No Suicidal Plan?: Yes-Currently Present Specify Current Suicidal Plan: Pt says he was walking in front of traffic  Access to Means: Yes Specify Access to Suicidal Means: Traffic/Environmental  What has been your use of drugs/alcohol within the last 12 months?: Abusing: alcohol/cocaine  Previous Attempts/Gestures: No How many times?: 0 Other Self Harm Risks: None  Triggers for Past Attempts: None known Intentional Self Injurious Behavior: None Comment - Self Injurious Behavior: None  Family Suicide History: No Recent stressful life event(s): Other (Comment) (Chronic SA ) Persecutory voices/beliefs?: No Depression: Yes Depression Symptoms: Loss of interest in usual pleasures;Feeling worthless/self pity Substance abuse history and/or treatment for substance abuse?: Yes Suicide prevention information given to non-admitted patients: Not applicable  Risk to Others Homicidal Ideation: No Thoughts of Harm to Others: No Current Homicidal Intent: No Current Homicidal Plan: No Access to Homicidal Means: No Identified Victim: None  History of harm to others?: No Assessment of Violence: None Noted Violent Behavior Description: None Does patient have access to weapons?: No Criminal Charges Pending?: Yes Describe Pending Criminal Charges: Per last note--larceny  Does patient have a court date: Yes Court Date:  (Per last note: November )  Psychosis Hallucinations: None noted Delusions: None noted  Mental Status Report Appear/Hygiene: Disheveled Eye Contact: Fair Motor Activity: Tremors Speech: Logical/coherent;Soft Level of Consciousness: Alert Mood: Sad;Depressed Affect: Depressed;Sad Anxiety Level: None Panic attack frequency: None  Most recent panic attack: None  Thought Processes: Coherent;Relevant Judgement: Impaired Orientation: Person;Place;Time;Situation Obsessive Compulsive Thoughts/Behaviors: None  Cognitive Functioning Concentration: Normal Memory:  Recent Intact;Remote Intact IQ: Average Level of Function: None  Insight: Fair Impulse Control: Fair Appetite: Fair Weight Loss: 0 Weight Gain: 0 Sleep: No Change Total Hours of Sleep: 6 Vegetative Symptoms: None  ADLScreening [facility] Assessment Services) Patient's cognitive ability adequate to safely complete daily activities?: Yes Patient able to express need for assistance with ADLs?: Yes Independently performs ADLs?: Yes (appropriate for developmental age)  Prior Inpatient Therapy Prior Inpatient Therapy: Yes Prior Therapy Dates: 2014, 2013 Prior Therapy Facilty/Provider(s): Teressa Lower, [facility]  Reason for Treatment: Detox/Rehab   Prior Outpatient Therapy Prior Outpatient Therapy: No Prior Therapy Dates: None  Prior Therapy Facilty/Provider(s): None  Reason for Treatment: None   ADL Screening (condition at time of admission) Patient's cognitive ability adequate to safely complete daily activities?: Yes Is the patient deaf or have difficulty hearing?: No Does the patient have difficulty seeing, even when wearing glasses/contacts?: No Does the patient have difficulty concentrating, remembering, or making decisions?: No Patient able to express need for assistance with ADLs?: Yes Does the patient have difficulty dressing or bathing?: No Independently performs ADLs?: Yes (appropriate for developmental age) Communication: Independent Dressing (OT): Independent Grooming: Independent Feeding: Independent Bathing: Independent Toileting: Independent In/Out Bed: Independent Walks in Home: Independent Does the patient have difficulty walking or climbing stairs?: No Weakness of Legs: None Weakness of Arms/Hands: None  Home Assistive Devices/Equipment Home Assistive Devices/Equipment: None  Therapy Consults (therapy consults require a physician order) PT  Evaluation Needed: No OT Evalulation Needed: No SLP Evaluation Needed: No Abuse/Neglect Assessment (Assessment to be  complete while patient is alone) Physical Abuse: Denies Verbal Abuse: Denies Sexual Abuse: Denies Exploitation of patient/patient's resources: Denies Self-Neglect: Denies Values / Beliefs Cultural Requests During Hospitalization: None Spiritual Requests During Hospitalization: None Consults Spiritual Care Consult Needed: No Social Work Consult Needed: No Merchant navy officer (For Healthcare) Advance Directive: Patient does not have advance directive;Patient would not like information Pre-existing out of facility DNR order (yellow form or pink MOST form): No Nutrition Screen- [facility] Adult/[facility] Patient's home diet: Regular  Additional Information 1:1 In Past 12 Months?: No CIRT Risk: No Elopement Risk: No Does patient have medical clearance?: Yes     Disposition:  Disposition Initial Assessment Completed for this Encounter: Yes Disposition of Patient: Referred to [facility] ) Type of inpatient treatment program: Adult Other disposition(s):  [facility] ) Patient referred to: Other (Comment) [facility] )  On Site Evaluation by:   Reviewed with Physician:    Murrell Redden 09/20/2013 3:39 AM

## 2013-09-20 NOTE — ED Provider Notes (Signed)
Discussed with TTS. Patient would like to be discharged so he can go back to his halfway house. He will be discharged. He has been seen by Laurice Record R. Rubin Payor, MD 09/20/13 1359

## 2013-09-22 NOTE — ED Provider Notes (Signed)
Medical screening examination/treatment/procedure(s) were performed by non-physician practitioner and as supervising physician I was immediately available for consultation/collaboration.  EKG Interpretation   None         Zarahi Fuerst L Maurilio Puryear, MD 09/22/13 2240 

## 2013-09-23 NOTE — Consult Note (Signed)
Agreeable with plan

## 2013-09-24 NOTE — Consult Note (Signed)
Pt was interviewed with NP. Agree with assessment and plan. Will d/c pt to [facility].

## 2013-09-27 ENCOUNTER — Emergency Department (HOSPITAL_COMMUNITY)
Admission: EM | Admit: 2013-09-27 | Discharge: 2013-09-28 | Disposition: A | Discharge status: Home / Self Care | Payer: Self-pay | Attending: Emergency Medicine | Admitting: Emergency Medicine

## 2013-09-27 ENCOUNTER — Encounter (HOSPITAL_COMMUNITY): Payer: Self-pay | Admitting: Emergency Medicine

## 2013-09-27 DIAGNOSIS — Z79899 Other long term (current) drug therapy: Secondary | ICD-10-CM | POA: Insufficient documentation

## 2013-09-27 DIAGNOSIS — F39 Unspecified mood [affective] disorder: Secondary | ICD-10-CM | POA: Insufficient documentation

## 2013-09-27 DIAGNOSIS — F17200 Nicotine dependence, unspecified, uncomplicated: Secondary | ICD-10-CM | POA: Insufficient documentation

## 2013-09-27 DIAGNOSIS — F1010 Alcohol abuse, uncomplicated: Secondary | ICD-10-CM | POA: Insufficient documentation

## 2013-09-27 DIAGNOSIS — F329 Major depressive disorder, single episode, unspecified: Secondary | ICD-10-CM | POA: Insufficient documentation

## 2013-09-27 DIAGNOSIS — R000 Tachycardia, unspecified: Secondary | ICD-10-CM | POA: Insufficient documentation

## 2013-09-27 DIAGNOSIS — F3289 Other specified depressive episodes: Secondary | ICD-10-CM | POA: Insufficient documentation

## 2013-09-27 MED ORDER — SODIUM CHLORIDE 0.9 % IV BOLUS (SEPSIS)
1000.0000 mL | Freq: Once | INTRAVENOUS | Status: AC
Start: 2013-09-27 — End: 2013-09-28
  Administered 2013-09-28: 1000 mL via INTRAVENOUS

## 2013-09-27 MED ORDER — VITAMIN B-1 100 MG PO TABS: 100.0000 mg | ORAL_TABLET | Freq: Every day | ORAL | Status: DC

## 2013-09-27 MED ORDER — ALUM & MAG HYDROXIDE-SIMETH 200-200-20 MG/5ML PO SUSP: 30.0000 mL | ORAL | Status: DC | PRN

## 2013-09-27 MED ORDER — ZOLPIDEM TARTRATE 5 MG PO TABS
5.0000 mg | ORAL_TABLET | Freq: Every evening | ORAL | Status: DC | PRN
  Administered 2013-09-28: 5 mg via ORAL
  Filled 2013-09-27: qty 1

## 2013-09-27 MED ORDER — IBUPROFEN 200 MG PO TABS: 600.0000 mg | ORAL_TABLET | Freq: Three times a day (TID) | ORAL | Status: DC | PRN

## 2013-09-27 MED ORDER — THIAMINE HCL 100 MG/ML IJ SOLN: 100.0000 mg | Freq: Every day | INTRAMUSCULAR | Status: DC

## 2013-09-27 MED ORDER — NICOTINE 21 MG/24HR TD PT24: 21.0000 mg | MEDICATED_PATCH | Freq: Every day | TRANSDERMAL | Status: DC

## 2013-09-27 MED ORDER — ONDANSETRON HCL 4 MG PO TABS: 4.0000 mg | ORAL_TABLET | Freq: Three times a day (TID) | ORAL | Status: DC | PRN

## 2013-09-27 MED ORDER — LORAZEPAM 1 MG PO TABS
1.0000 mg | ORAL_TABLET | Freq: Three times a day (TID) | ORAL | Status: DC | PRN
  Administered 2013-09-28: 1 mg via ORAL
  Filled 2013-09-27: qty 1

## 2013-09-27 NOTE — ED Provider Notes (Signed)
CSN: [id number]     Arrival date & time 09/27/13  2314 History   First MD Initiated Contact with Patient 09/27/13 2337     Chief Complaint  Patient presents with  . Alcohol Intoxication   (Consider location/radiation/quality/duration/timing/severity/associated sxs/prior Treatment) HPI Comments: Pt is a 42 y/o male with a PMHx of alcohol abuse and depression who presents to the ED requesting detox from alcohol and worsening depression. He was discharged from the ED on 11/17 for similar symptoms, states he was sober for about 2 days after leaving the hospital and began drinking heavily. Drinks over a case of beer daily. Last drink was around 11 am today when he had a 40-oz beer. States he feels as if he is drinking himself to death. Reluctant to answer when asking about SI. Denies HI. Denies hx of withdrawal seizures.  Patient is a 42 y.o. male presenting with intoxication. The history is provided by the patient.  Alcohol Intoxication    Past Medical History  Diagnosis Date  . Alcohol abuse   . Depression    History reviewed. No pertinent past surgical history. History reviewed. No pertinent family history. History  Substance Use Topics  . Smoking status: Current Every Day Smoker -- 1.00 packs/day    Types: Cigarettes  . Smokeless tobacco: Not on file  . Alcohol Use: 7.2 oz/week    12 Cans of beer per week     Comment: a case a day; pt told this writer 12 pk daily or a fifth of vodka    Review of Systems  Psychiatric/Behavioral: Positive for dysphoric mood.       Positive for alcohol abuse.  All other systems reviewed and are negative.    Allergies  Shellfish allergy  Home Medications   Current Outpatient Rx  Name  Route  Sig  Dispense  Refill  . ibuprofen (ADVIL,MOTRIN) 200 MG tablet   Oral   Take 400 mg by mouth every 6 (six) hours as needed for moderate pain.          Marland Kitchen FLUoxetine (PROZAC) 20 MG tablet   Oral   Take 1 tablet (20 mg total) by mouth daily.   30  tablet   0   . hydrOXYzine (ATARAX/VISTARIL) 25 MG tablet   Oral   Take 1 tablet (25 mg total) by mouth every 6 (six) hours as needed for anxiety (or CIWA score </= 10).   30 tablet   0   . traZODone (DESYREL) 50 MG tablet   Oral   Take 1 tablet (50 mg total) by mouth at bedtime as needed for sleep.   30 tablet   0    BP 147/98  Pulse 108  Temp(Src) 98.6 F (37 C) (Oral)  Resp 20  SpO2 98% Physical Exam  Nursing note and vitals reviewed. Constitutional: He is oriented to person, place, and time. He appears well-developed and well-nourished. No distress.  HENT:  Head: Normocephalic and atraumatic.  Mouth/Throat: Oropharynx is clear and moist.  Eyes: Conjunctivae are normal. No scleral icterus.  Neck: Normal range of motion. Neck supple.  Cardiovascular: Regular rhythm and normal heart sounds.  Tachycardia present.   Pulmonary/Chest: Effort normal and breath sounds normal.  Abdominal: Soft. Bowel sounds are normal. There is no tenderness.  Musculoskeletal: Normal range of motion. He exhibits no edema.  Neurological: He is alert and oriented to person, place, and time.  Skin: Skin is warm and dry. He is not diaphoretic.  Psychiatric: His speech is normal and  behavior is normal. He exhibits a depressed mood. He expresses no homicidal ideation. He expresses no suicidal plans.  Tearful.    ED Course  Procedures (including critical care time) Labs Review Labs Reviewed  URINE RAPID DRUG SCREEN (HOSP PERFORMED)  CBC WITH DIFFERENTIAL  COMPREHENSIVE METABOLIC PANEL  ETHANOL   Imaging Review No results found.  EKG Interpretation   None       MDM   1. Alcohol abuse   2. Depression    Patient presenting requesting detox, also complaining of depression, will not give a straight answer when discussing suicidal ideations. Labs pending. TTS consult. Patient discussed with Earley Favor, NP at shift change who will take over care of patient at this time.    Trevor Mace, PA-C 09/28/13 [phone]

## 2013-09-27 NOTE — ED Notes (Addendum)
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

## 2013-09-27 NOTE — ED Notes (Signed)
Patient is alert and oriented x3.  He is complaining of ETOH problem.  He is requesting evaluation for Detox program. He states his last drink was 11am.  He denies any pain

## 2013-09-28 LAB — COMPREHENSIVE METABOLIC PANEL
ALT: 27 U/L (ref 0–53)
AST: 40 U/L — ABNORMAL HIGH (ref 0–37)
BUN: 7 mg/dL (ref 6–23)
CO2: 23 mEq/L (ref 19–32)
Calcium: 8.8 mg/dL (ref 8.4–10.5)
Chloride: 99 mEq/L (ref 96–112)
GFR calc Af Amer: >90 mL/min (ref >90)
GFR calc non Af Amer: >90 mL/min (ref >90)
Glucose, Bld: 79 mg/dL (ref 70–99)
Sodium: 137 mEq/L (ref 135–145)
Total Bilirubin: 0.3 mg/dL (ref 0.3–1.2)

## 2013-09-28 LAB — RAPID URINE DRUG SCREEN, HOSP PERFORMED
Barbiturates: NOT DETECTED
Cocaine: NOT DETECTED

## 2013-09-28 LAB — CBC WITH DIFFERENTIAL/PLATELET
Basophils Absolute: 0.1 10*3/uL (ref 0.0–0.1)
Eosinophils Relative: 2 % (ref 0–5)
HCT: 42.1 % (ref 39.0–52.0)
Hemoglobin: 14.4 g/dL (ref 13.0–17.0)
Lymphocytes Relative: 41 % (ref 12–46)
Lymphs Abs: 2.4 10*3/uL (ref 0.7–4.0)
MCH: 27.7 pg (ref 26.0–34.0)
MCV: 81 fL (ref 78.0–100.0)
Monocytes Absolute: 0.5 10*3/uL (ref 0.1–1.0)
Monocytes Relative: 8 % (ref 3–12)
Platelets: 201 10*3/uL (ref 150–400)
RBC: 5.2 MIL/uL (ref 4.22–5.81)
WBC: 5.8 10*3/uL (ref 4.0–10.5)

## 2013-09-28 MED ORDER — LORAZEPAM 1 MG PO TABS: 1.0000 mg | ORAL_TABLET | Freq: Four times a day (QID) | ORAL | Status: DC | PRN

## 2013-09-28 NOTE — Progress Notes (Signed)
Writer was informed by the nurse that the patient wants to be discharged.  The nurse reports that she informed the ERMD of the patients decision to discharge.

## 2013-09-28 NOTE — BH Assessment (Signed)
Assessment Note   Pt is a 42 year old [place] American male with a history of alcohol abuse and depression.  Patient requests detox from alcohol and worsening depression. Patient was discharged from the ED on 09/20/2013 for similar symptoms.  Patient reports that he was sober for about 2 days after leaving the hospital and began drinking heavily.  Patient reports that he drinks over a case of beer daily.  Patient reports that his last drink was around 11 am today when he had a 40-oz beer.  Patient reports some withdrawal symptoms.  Patient reports depression but denies SI.  Patient denies HI and psychosis.    Patient reports a prior history of psychiatric hospitalizations and detox.  Patient denies outpatient mental health hospitalization.     Axis I: Alcohol Dependence and Major Depressive Disorder  Axis II: Deferred Axis III:  Past Medical History  Diagnosis Date  . Alcohol abuse   . Depression    Axis IV: economic problems, occupational problems, other psychosocial or environmental problems, problems related to social environment, problems with access to health care services and problems with primary support group Axis V: 31-40 impairment in reality testing  Past Medical History:  Past Medical History  Diagnosis Date  . Alcohol abuse   . Depression     History reviewed. No pertinent past surgical history.  Family History: History reviewed. No pertinent family history.  Social History:  reports that he has been smoking Cigarettes.  He has been smoking about 1.00 pack per day. He does not have any smokeless tobacco history on file. He reports that he drinks about 7.2 ounces of alcohol per week. He reports that he uses illicit drugs ("Crack" cocaine and Cocaine).  Additional Social History:     CIWA: CIWA-Ar BP: 115/64 mmHg Pulse Rate: 108 Nausea and Vomiting: intermittent nausea with dry heaves Tactile Disturbances: none Tremor: not visible, but can be felt fingertip to  fingertip Auditory Disturbances: not present Paroxysmal Sweats: no sweat visible Visual Disturbances: not present Anxiety: moderately anxious, or guarded, so anxiety is inferred Headache, Fullness in Head: none present Agitation: somewhat more than normal activity Orientation and Clouding of Sensorium: oriented and can do serial additions CIWA-Ar Total: 10 COWS:    Allergies:  Allergies  Allergen Reactions  . Shellfish Allergy Anaphylaxis    Home Medications:  (Not in a hospital admission)  OB/GYN Status:  No LMP for male patient.  General Assessment Data Location of Assessment: [facility] Is this a Tele or Face-to-Face Assessment?: Face-to-Face Is this an Initial Assessment or a Re-assessment for this encounter?: Initial Assessment Living Arrangements: Alone Can pt return to current living arrangement?: Yes Admission Status: Involuntary Is patient capable of signing voluntary admission?: Yes Transfer from: Acute Hospital Referral Source: Self/Family/Friend  Medical Screening Exam [facility] Walk-in ONLY) Medical Exam completed: Yes  [facility] Crisis Care Plan Living Arrangements: Alone Name of Psychiatrist: None  Name of Therapist: None   Education Status Is patient currently in school?: Yes Current Grade: N/A Highest grade of school patient has completed: None  Name of school: None  Contact person: None   Risk to self Suicidal Ideation: Yes-Currently Present Suicidal Intent: No Is patient at risk for suicide?: No Suicidal Plan?: No Specify Current Suicidal Plan: Patient will drink himself to death. Access to Means: Yes Specify Access to Suicidal Means: alcohol What has been your use of drugs/alcohol within the last 12 months?: alcohol Previous Attempts/Gestures: No How many times?: 0 Other Self Harm Risks: None Triggers  for Past Attempts: None known Intentional Self Injurious Behavior: None Comment - Self Injurious Behavior: None Family Suicide History: No Recent  stressful life event(s): Other (Comment) Persecutory voices/beliefs?: No Depression: Yes Depression Symptoms: Loss of interest in usual pleasures;Feeling worthless/self pity;Feeling angry/irritable;Insomnia;Guilt Substance abuse history and/or treatment for substance abuse?: Yes Suicide prevention information given to non-admitted patients: Not applicable  Risk to Others Homicidal Ideation: No Thoughts of Harm to Others: No Current Homicidal Intent: No Current Homicidal Plan: No Access to Homicidal Means: No Identified Victim: None History of harm to others?: No Assessment of Violence: None Noted Violent Behavior Description: Calm Does patient have access to weapons?: No Criminal Charges Pending?: No Describe Pending Criminal Charges: None Reported Does patient have a court date: No Court Date:  (N/A)  Psychosis Hallucinations: None noted Delusions: None noted  Mental Status Report Appear/Hygiene: Disheveled Eye Contact: Fair Motor Activity: Freedom of movement Speech: Logical/coherent;Soft Level of Consciousness: Alert;Irritable Mood: Depressed;Guilty;Despair;Worthless, low self-esteem Affect: Depressed;Sad Anxiety Level: None Panic attack frequency: None Most recent panic attack: N/A Thought Processes: Coherent;Relevant Judgement: Unimpaired Orientation: Person;Place;Time;Situation Obsessive Compulsive Thoughts/Behaviors: None  Cognitive Functioning Concentration: Decreased Memory: Recent Intact;Remote Intact IQ: Average Insight: Poor Appetite: Poor Weight Loss: 5 Weight Gain: 0 Sleep: Decreased Total Hours of Sleep: 3 (Patient reports that he drinks himself to sleep. ) Vegetative Symptoms: None  ADLScreening [facility] Assessment Services) Patient's cognitive ability adequate to safely complete daily activities?: Yes Patient able to express need for assistance with ADLs?: Yes Independently performs ADLs?: Yes (appropriate for developmental age)  Prior  Inpatient Therapy Prior Inpatient Therapy: Yes Prior Therapy Dates: 2014, 2013 Prior Therapy Facilty/Provider(s): [facility]a [facility], [facility]  Reason for Treatment: Detox/Rehab   Prior Outpatient Therapy Prior Outpatient Therapy: No Prior Therapy Dates: None  Prior Therapy Facilty/Provider(s): None  Reason for Treatment: None   ADL Screening (condition at time of admission) Patient's cognitive ability adequate to safely complete daily activities?: Yes Patient able to express need for assistance with ADLs?: Yes Independently performs ADLs?: Yes (appropriate for developmental age)         Values / Beliefs Cultural Requests During Hospitalization: None Spiritual Requests During Hospitalization: None        Additional Information 1:1 In Past 12 Months?: No CIRT Risk: No Elopement Risk: No Does patient have medical clearance?: Yes     Disposition: Pending [facility] or discharge home.  Disposition Initial Assessment Completed for this Encounter: Yes Disposition of Patient: Referred to Type of inpatient treatment program: Adult Other disposition(s): Other (Comment) Patient referred to: [facility]  On Site Evaluation by:   Reviewed with Physician:    Phillip Heal LaVerne 09/28/2013 4:53 AM

## 2013-09-28 NOTE — ED Provider Notes (Signed)
Medical screening examination/treatment/procedure(s) were conducted as a shared visit with non-physician practitioner(s) and myself.  I personally evaluated the patient during the encounter.  EKG Interpretation   None       7:35 AM Sober at this time.  No homicidal or suicidal thoughts.  Discharge home with outpatient resources for his ongoing alcohol abuse.  This is a recurrent issue for the patient.  He will be returning back to [facility] tomorrow.  He is agreeable to outpatient plan  Lyanne Co, MD 09/28/13 [phone]

## 2013-09-28 NOTE — ED Notes (Signed)
Writer gave pt a ham sandwich and a sprite

## 2013-09-28 NOTE — Progress Notes (Signed)
Writer consulted with Donell Sievert, PA regarding the patient meeting criteria inpatient detox at [facility].  Patient has past thoughts of wanting of to hurt himself but does not have a plan to harm himself presently.  Patient denies HI and psychosis.  Therefore, he will be referred to [facility].    Writer informed the PA, Dondra Spry) and the nurse working with the patient.  Writer informed the MHT Rosebud Poles) at the [facility] Office so the patient disposition can written on the log.

## 2013-12-30 ENCOUNTER — Encounter (HOSPITAL_COMMUNITY): Payer: Self-pay | Admitting: Emergency Medicine

## 2013-12-30 ENCOUNTER — Emergency Department (HOSPITAL_COMMUNITY)
Admission: EM | Admit: 2013-12-30 | Discharge: 2013-12-31 | Disposition: A | Discharge status: Rehabilitation Facility | Payer: Self-pay | Attending: Emergency Medicine | Admitting: Emergency Medicine

## 2013-12-30 DIAGNOSIS — F32A Depression, unspecified: Secondary | ICD-10-CM

## 2013-12-30 DIAGNOSIS — F1410 Cocaine abuse, uncomplicated: Secondary | ICD-10-CM | POA: Insufficient documentation

## 2013-12-30 DIAGNOSIS — F1020 Alcohol dependence, uncomplicated: Secondary | ICD-10-CM

## 2013-12-30 DIAGNOSIS — F17200 Nicotine dependence, unspecified, uncomplicated: Secondary | ICD-10-CM | POA: Insufficient documentation

## 2013-12-30 DIAGNOSIS — F3289 Other specified depressive episodes: Secondary | ICD-10-CM | POA: Insufficient documentation

## 2013-12-30 DIAGNOSIS — F10229 Alcohol dependence with intoxication, unspecified: Secondary | ICD-10-CM | POA: Insufficient documentation

## 2013-12-30 DIAGNOSIS — F329 Major depressive disorder, single episode, unspecified: Secondary | ICD-10-CM | POA: Insufficient documentation

## 2013-12-30 LAB — CBC
HEMATOCRIT: 39.5 % (ref 39.0–52.0)
HEMOGLOBIN: 13.6 g/dL (ref 13.0–17.0)
MCH: 28 pg (ref 26.0–34.0)
MCHC: 34.4 g/dL (ref 30.0–36.0)
MCV: 81.3 fL (ref 78.0–100.0)
Platelets: 167 10*3/uL (ref 150–400)
RBC: 4.86 MIL/uL (ref 4.22–5.81)
RDW: 15.6 % — ABNORMAL HIGH (ref 11.5–15.5)
WBC: 4.9 10*3/uL (ref 4.0–10.5)

## 2013-12-30 LAB — RAPID URINE DRUG SCREEN, HOSP PERFORMED
Amphetamines: NOT DETECTED
BARBITURATES: NOT DETECTED
BENZODIAZEPINES: NOT DETECTED
Cocaine: NOT DETECTED
Opiates: NOT DETECTED
TETRAHYDROCANNABINOL: NOT DETECTED

## 2013-12-30 LAB — SALICYLATE LEVEL: Salicylate Lvl: <2 mg/dL — ABNORMAL LOW (ref 2.8–20.0)

## 2013-12-30 LAB — ACETAMINOPHEN LEVEL

## 2013-12-30 LAB — ETHANOL: ALCOHOL ETHYL (B): 246 mg/dL — AB (ref 0–11)

## 2013-12-30 NOTE — ED Notes (Signed)
House coverage made aware of need of sitter.  Security called to wand pt.  Charge nurse made aware.

## 2013-12-30 NOTE — ED Notes (Addendum)
Pt st's he needs detox from ETOH.  Pt st's he was sober for 2 months and his mom died 2 weeks ago and he has been drinking heavy everyday since then.  St's he has had 2 40oz beers today.  Normally drinks 1 case everyday.  Pt st's he doesn't feel like living anymore.

## 2013-12-30 NOTE — ED Notes (Signed)
Pt states he is not suicidal and has no intentions of hurting his self; pt states he is just sad due to mother's death. Pt states he wants to stop the withdrawal symptoms of alcohol before they start.

## 2013-12-31 LAB — COMPREHENSIVE METABOLIC PANEL
ALT: 30 U/L (ref 0–53)
AST: 50 U/L — ABNORMAL HIGH (ref 0–37)
Albumin: 4 g/dL (ref 3.5–5.2)
Alkaline Phosphatase: 62 U/L (ref 39–117)
BILIRUBIN TOTAL: 0.2 mg/dL — AB (ref 0.3–1.2)
BUN: 6 mg/dL (ref 6–23)
CO2: 23 meq/L (ref 19–32)
CREATININE: 0.92 mg/dL (ref 0.50–1.35)
Calcium: 8.9 mg/dL (ref 8.4–10.5)
Chloride: 101 mEq/L (ref 96–112)
GFR calc Af Amer: >90 mL/min (ref >90)
GFR calc non Af Amer: >90 mL/min (ref >90)
Glucose, Bld: 77 mg/dL (ref 70–99)
Potassium: 4.1 mEq/L (ref 3.7–5.3)
Sodium: 140 mEq/L (ref 137–147)
Total Protein: 8 g/dL (ref 6.0–8.3)

## 2013-12-31 MED ORDER — NICOTINE 21 MG/24HR TD PT24: 21.0000 mg | MEDICATED_PATCH | Freq: Every day | TRANSDERMAL | Status: DC

## 2013-12-31 MED ORDER — ONDANSETRON HCL 4 MG PO TABS: 4.0000 mg | ORAL_TABLET | Freq: Three times a day (TID) | ORAL | Status: DC | PRN

## 2013-12-31 MED ORDER — IBUPROFEN 400 MG PO TABS: 600.0000 mg | ORAL_TABLET | Freq: Three times a day (TID) | ORAL | Status: DC | PRN

## 2013-12-31 MED ORDER — LORAZEPAM 1 MG PO TABS: 0.0000 mg | ORAL_TABLET | Freq: Two times a day (BID) | ORAL | Status: DC

## 2013-12-31 MED ORDER — LORAZEPAM 1 MG PO TABS
0.0000 mg | ORAL_TABLET | Freq: Four times a day (QID) | ORAL | Status: DC
  Administered 2013-12-31 (×2): 1 mg via ORAL
  Filled 2013-12-31 (×2): qty 1

## 2013-12-31 MED ORDER — ZOLPIDEM TARTRATE 5 MG PO TABS
5.0000 mg | ORAL_TABLET | Freq: Every evening | ORAL | Status: DC | PRN
  Administered 2013-12-31: 5 mg via ORAL
  Filled 2013-12-31: qty 1

## 2013-12-31 MED ORDER — ALUM & MAG HYDROXIDE-SIMETH 200-200-20 MG/5ML PO SUSP: 30.0000 mL | ORAL | Status: DC | PRN

## 2013-12-31 NOTE — [facility] Counselor (Signed)
Referral [facility] with [facility], [facility]  [facility] has no beds avail at this time.

## 2013-12-31 NOTE — ED Notes (Signed)
PLEM called for transport at 10.30 am

## 2013-12-31 NOTE — ED Notes (Signed)
MD at bedside. 

## 2013-12-31 NOTE — ED Notes (Signed)
Tele Psy in process

## 2013-12-31 NOTE — BH Assessment (Signed)
Assessment Note  Chad Sanchez is a 43 y.o. male who presents to [facility] for alcohol and cocaine detox.  Pt denies SI/HI/AVH.  Pt reports the following: he is drinking 1 case of beer, daily, last drink was 12/31/13, pt consumed 2-40's and 1-24oz beer.  Pt also uses 1 gram of cocaine, monthly.  Pt.'s last use was 2 days, he used 1/2 gram.  Pt denies seizures or blackouts and no current legal charges.  Pt is c/o tremors and has been treated per hosp protocol.   Axis I: Alcohol use disorder, Severe; Cocaine use disorder, Mild Axis II: Deferred Axis III:  Past Medical History  Diagnosis Date  . Alcohol abuse   . Depression    Axis IV: other psychosocial or environmental problems, problems related to social environment and problems with primary support group Axis V: 41-50 serious symptoms  Past Medical History:  Past Medical History  Diagnosis Date  . Alcohol abuse   . Depression     History reviewed. No pertinent past surgical history.  Family History: No family history on file.  Social History:  reports that he has been smoking Cigarettes.  He has been smoking about 1.00 pack per day. He does not have any smokeless tobacco history on file. He reports that he drinks about 7.2 ounces of alcohol per week. He reports that he uses illicit drugs ("Crack" cocaine and Cocaine).  Additional Social History:  Alcohol / Drug Use Pain Medications: None  Prescriptions: None  Over the Counter: None  History of alcohol / drug use?: Yes Longest period of sobriety (when/how long): None  Negative Consequences of Use: Work / School;Personal relationships;Financial Withdrawal Symptoms: Tremors Substance #1 Name of Substance 1: Alcohol  1 - Age of First Use: 11YOM  1 - Amount (size/oz): 1 Case  1 - Frequency: Daily  1 - Duration: On-going  1 - Last Use / Amount: 12/31/13 Substance #2 Name of Substance 2: Cocaine  2 - Age of First Use: 32YOM  2 - Amount (size/oz): 1 Gram  2 - Frequency:  Monthly  2 - Duration: On-going  2 - Last Use / Amount: 2 Days Ago   CIWA: CIWA-Ar BP: 115/76 mmHg Pulse Rate: 88 Nausea and Vomiting: mild nausea with no vomiting Tactile Disturbances: none Tremor: two Auditory Disturbances: not present Paroxysmal Sweats: barely perceptible sweating, palms moist Visual Disturbances: not present Anxiety: two Headache, Fullness in Head: none present Agitation: normal activity Orientation and Clouding of Sensorium: oriented and can do serial additions CIWA-Ar Total: 6 COWS:    Allergies:  Allergies  Allergen Reactions  . Shellfish Allergy Anaphylaxis    Home Medications:  (Not in a hospital admission)  OB/GYN Status:  No LMP for male patient.  General Assessment Data Location of Assessment: [facility] ED Is this a Tele or Face-to-Face Assessment?: Tele Assessment Is this an Initial Assessment or a Re-assessment for this encounter?: Initial Assessment Living Arrangements: Alone Can pt return to current living arrangement?: Yes Admission Status: Voluntary Is patient capable of signing voluntary admission?: Yes Transfer from: Acute Hospital Referral Source: MD  Medical Screening Exam [facility] Walk-in ONLY) Medical Exam completed: No Reason for MSE not completed: Other: (None )  [facility] Crisis Care Plan Living Arrangements: Alone Name of Psychiatrist: None  Name of Therapist: None   Education Status Is patient currently in school?: No Current Grade: None  Highest grade of school patient has completed: None  Name of school: None  Contact person: None   Risk to self  Suicidal Ideation: No Suicidal Intent: No Is patient at risk for suicide?: No Suicidal Plan?: No Access to Means: No What has been your use of drugs/alcohol within the last 12 months?: Abusing: alcohol, cocaine  Previous Attempts/Gestures: No How many times?: 0 Other Self Harm Risks: None  Triggers for Past Attempts: None known Intentional Self Injurious Behavior: None Family  Suicide History: No Recent stressful life event(s): Other (Comment) (Chronicity ) Persecutory voices/beliefs?: No Depression: Yes Depression Symptoms: Loss of interest in usual pleasures Substance abuse history and/or treatment for substance abuse?: Yes Suicide prevention information given to non-admitted patients: Not applicable  Risk to Others Homicidal Ideation: No Thoughts of Harm to Others: No Current Homicidal Intent: No Current Homicidal Plan: No Access to Homicidal Means: No Identified Victim: None  History of harm to others?: No Assessment of Violence: None Noted Violent Behavior Description: None  Does patient have access to weapons?: No Criminal Charges Pending?: No Does patient have a court date: No  Psychosis Hallucinations: None noted Delusions: None noted  Mental Status Report Appear/Hygiene: Disheveled;Poor hygiene Eye Contact: Fair Motor Activity: Unremarkable Speech: Logical/coherent Level of Consciousness: Alert Mood: Sad Affect: Appropriate to circumstance;Sad Anxiety Level: None Thought Processes: Coherent;Relevant Judgement: Unimpaired Orientation: Person;Place;Time;Situation Obsessive Compulsive Thoughts/Behaviors: None  Cognitive Functioning Concentration: Normal Memory: Recent Intact;Remote Intact IQ: Average Insight: Fair Impulse Control: Fair Appetite: Good Weight Loss: 0 Weight Gain: 0 Sleep: No Change Total Hours of Sleep: 5 Vegetative Symptoms: None  ADLScreening [facility] Assessment Services) Patient's cognitive ability adequate to safely complete daily activities?: Yes Patient able to express need for assistance with ADLs?: Yes Independently performs ADLs?: Yes (appropriate for developmental age)  Prior Inpatient Therapy Prior Inpatient Therapy: Yes Prior Therapy Dates: 2014 [facility]  Reason for Treatment: Detox   Prior Outpatient Therapy Prior Outpatient Therapy: No Prior Therapy Dates: None   Prior Therapy Facilty/Provider(s): None  Reason for Treatment: None   ADL Screening (condition at time of admission) Patient's cognitive ability adequate to safely complete daily activities?: Yes Is the patient deaf or have difficulty hearing?: No Does the patient have difficulty seeing, even when wearing glasses/contacts?: No Does the patient have difficulty concentrating, remembering, or making decisions?: No Patient able to express need for assistance with ADLs?: Yes Does the patient have difficulty dressing or bathing?: No Independently performs ADLs?: Yes (appropriate for developmental age) Does the patient have difficulty walking or climbing stairs?: No Weakness of Legs: None Weakness of Arms/Hands: None  Home Assistive Devices/Equipment Home Assistive Devices/Equipment: None  Therapy Consults (therapy consults require a physician order) PT Evaluation Needed: No OT Evalulation Needed: No SLP Evaluation Needed: No Abuse/Neglect Assessment (Assessment to be complete while patient is alone) Physical Abuse: Denies Verbal Abuse: Denies Sexual Abuse: Denies Exploitation of patient/patient's resources: Denies Self-Neglect: Denies Values / Beliefs Cultural Requests During Hospitalization: None Spiritual Requests During Hospitalization: None Consults Spiritual Care Consult Needed: No Social Work Consult Needed: No Merchant navy officerAdvance Directives (For Healthcare) Advance Directive: Patient does not have advance directive;Patient would not like information Pre-existing out of facility DNR order (yellow form or pink MOST form): No Nutrition Screen- [facility] Adult/[facility] Patient's home diet: Regular  Additional Information 1:1 In Past 12 Months?: No CIRT Risk: No Elopement Risk: No Does patient have medical clearance?: Yes     Disposition:  Disposition Initial Assessment Completed for this Encounter: Yes Disposition of Patient: Inpatient treatment program;Referred to ([facility] ) Type of  inpatient treatment program: Adult Patient referred to: [facility]  On Site Evaluation by:  Reviewed with Physician:    Murrell Redden 12/31/2013 7:24 AM

## 2013-12-31 NOTE — ED Notes (Signed)
Per PA pt released from Suicide Precautions; Sitter no longer needed; Room door will remain open.

## 2013-12-31 NOTE — Progress Notes (Signed)
Per Efraim KaufmannMelissa, [facility]  [facility].  [facility] requests that pt arrive at 1100 for their admission to their facility.  RN was informed and will call [facility] for transport.   Blain PaisMichelle L Kole Hilyard, MHT/NS

## 2013-12-31 NOTE — ED Provider Notes (Signed)
CSN: [id number]     Arrival date & time 12/30/13  2230 History   First MD Initiated Contact with Patient 12/30/13 2307     Chief Complaint  Patient presents with  . Medical Clearance   HPI  History provided by the patient. Patient is a 43 year old male with history of depression and alcohol abuse who presents with requests for help with alcohol detox. Patient has had problems with alcohol abuse for many years. He was sober for some period of time but reports that his mother passed away 2 months ago and since that time he has had increased depression which has led to heavy drinking. Patient reports drinking both liquor and appears in heavy amounts during the day. He states anytime he stops drinking for too long he begins to have tremors and felt nauseous. He denies to me any suicidal ideations or homicidal ideations. He does admit that he told triage nurse that he sometimes feels depressed and does not see the point of living but would never act on harming himself. No prior history of self-harm or suicide attempt. He also admits to some recent cocaine use. He denies any other symptoms.    Past Medical History  Diagnosis Date  . Alcohol abuse   . Depression    History reviewed. No pertinent past surgical history. No family history on file. History  Substance Use Topics  . Smoking status: Current Every Day Smoker -- 1.00 packs/day    Types: Cigarettes  . Smokeless tobacco: Not on file  . Alcohol Use: 7.2 oz/week    12 Cans of beer per week     Comment: a case a day; pt told this writer 12 pk daily or a fifth of vodka    Review of Systems  Constitutional: Negative for fever.  Gastrointestinal: Negative for abdominal pain.  Neurological: Negative for tremors and seizures.  All other systems reviewed and are negative.      Allergies  Shellfish allergy  Home Medications  No current outpatient prescriptions on file. BP 116/81  Pulse 103  Temp(Src) 98.3 F (36.8 C) (Oral)  Resp  18  Wt 160 lb (72.576 kg)  SpO2 96% Physical Exam  Nursing note and vitals reviewed. Constitutional: He is oriented to person, place, and time. He appears well-developed and well-nourished. No distress.  HENT:  Head: Normocephalic.  Cardiovascular: Normal rate and regular rhythm.   Pulmonary/Chest: Effort normal and breath sounds normal. No respiratory distress. He has no wheezes. He has no rales.  Abdominal: Soft. There is no tenderness. There is no rebound and no guarding.  Musculoskeletal: Normal range of motion.  Neurological: He is alert and oriented to person, place, and time.  Skin: Skin is warm.  Psychiatric: His behavior is normal. He exhibits a depressed mood. He expresses no homicidal and no suicidal ideation.    ED Course  Procedures   DIAGNOSTIC STUDIES: Oxygen Saturation is 98% on room air.    COORDINATION OF CARE:  Nursing notes reviewed. Vital signs reviewed. Initial pt interview and examination performed.   12:15 AM-patient seen and evaluated. He appears well no acute distress. No signs of withdrawal at this time. Denies SI or HI. Discussed work up plan with pt at bedside, which includes medical clearance and TTS consult. Pt agrees with plan.  Psychiatric holding orders in place. TTS consult placed.   Results for orders placed during the hospital encounter of 12/30/13  CBC      Result Value Ref Range   WBC 4.9  4.0 - 10.5 K/uL   RBC 4.86  4.22 - 5.81 MIL/uL   Hemoglobin 13.6  13.0 - 17.0 g/dL   HCT 40.1  02.7 - 25.3 %   MCV 81.3  78.0 - 100.0 fL   MCH 28.0  26.0 - 34.0 pg   MCHC 34.4  30.0 - 36.0 g/dL   RDW 66.4 (*) 40.3 - 47.4 %   Platelets 167  150 - 400 K/uL  COMPREHENSIVE METABOLIC PANEL      Result Value Ref Range   Sodium 140  137 - 147 mEq/L   Potassium 4.1  3.7 - 5.3 mEq/L   Chloride 101  96 - 112 mEq/L   CO2 23  19 - 32 mEq/L   Glucose, Bld 77  70 - 99 mg/dL   BUN 6  6 - 23 mg/dL   Creatinine, Ser 2.59  0.50 - 1.35 mg/dL   Calcium 8.9   8.4 - 56.3 mg/dL   Total Protein 8.0  6.0 - 8.3 g/dL   Albumin 4.0  3.5 - 5.2 g/dL   AST 50 (*) 0 - 37 U/L   ALT 30  0 - 53 U/L   Alkaline Phosphatase 62  39 - 117 U/L   Total Bilirubin 0.2 (*) 0.3 - 1.2 mg/dL   GFR calc non Af Amer >90  >90 mL/min   GFR calc Af Amer >90  >90 mL/min  ETHANOL      Result Value Ref Range   Alcohol, Ethyl (B) 246 (*) 0 - 11 mg/dL  ACETAMINOPHEN LEVEL      Result Value Ref Range   Acetaminophen (Tylenol), Serum <15.0  10 - 30 ug/mL  SALICYLATE LEVEL      Result Value Ref Range   Salicylate Lvl <2.0 (*) 2.8 - 20.0 mg/dL  URINE RAPID DRUG SCREEN (HOSP PERFORMED)      Result Value Ref Range   Opiates NONE DETECTED  NONE DETECTED   Cocaine NONE DETECTED  NONE DETECTED   Benzodiazepines NONE DETECTED  NONE DETECTED   Amphetamines NONE DETECTED  NONE DETECTED   Tetrahydrocannabinol NONE DETECTED  NONE DETECTED   Barbiturates NONE DETECTED  NONE DETECTED        MDM   Final diagnoses:  Alcohol dependence  Depression         Angus Seller, PA-C 12/31/13 [phone]

## 2013-12-31 NOTE — ED Provider Notes (Signed)
Medical screening examination/treatment/procedure(s) were performed by non-physician practitioner and as supervising physician I was immediately available for consultation/collaboration.  EKG Interpretation   None         Loren Raceravid Shamiah Kahler, MD 12/31/13 [phone]

## 2013-12-31 NOTE — Clinical Social Work Psychosocial (Signed)
Clinical Social Work Department BRIEF PSYCHOSOCIAL ASSESSMENT 12/31/2013  Patient:  Chad Sanchez,Chad Sanchez     Account Number:  [id number]     Admit date:  12/30/2013  Clinical Social Worker:  Cristy FolksBEST,Tiny Rietz, LCSW  Date/Time:  12/31/2013 09:29 AM  Referred by:  [name]  Date Referred:  12/31/2013 Referred for  Substance Abuse   Other Referral:   N/A   Interview type:  Patient Other interview type:   N/A    PSYCHOSOCIAL DATA Living Status:  ALONE Admitted from facility:   Level of care:   Primary support name:  N/A Primary support relationship to patient:  N/A Degree of support available:   Poor Support    CURRENT CONCERNS Current Concerns  Behavioral Health Issues  Financial Resources   Other Concerns:    SOCIAL WORK ASSESSMENT / PLAN [name] consult to pt regarding mental healhth concerns and long term subtance use disorder. Pt reported that has been depressed for quite some time and was taking Prozac. Pt. reported that he stopped taking it because of the side effects and starting drinking to self medicate. Pt reported that [facility]. He reported that the program changed his life but after discharge he found that he had no coping mechanisms to deal with stress and started drinking alcohol. Pt reported to [name] that at this point that he is drinking to keep from getting sick.[facility]. [facility] and will be transported today.Pt does not have a good support system. Pt reported that his mother pased away 2 weeks ago.   Assessment/plan status:  No Further Intervention Required Other assessment/ plan:  N/A Information/referral to community resources:  N/A  PATIENT'S/FAMILY'S RESPONSE TO PLAN OF CARE:  Pt appreciated information given by [name]. Pt understood role of [name].

## 2013-12-31 NOTE — ED Notes (Signed)
[facility]. Vital signs stable and GCS 15.

## 2013-12-31 NOTE — [facility] Counselor (Signed)
Per Melissa @[facility] , pt accepted for detox treatment.  [facility], this Clinical research associatewriter contacted Foye ClockKristina and asked her to [facility]ed [facility] [phone].  This Clinical research associatewriter will call disposition tech--Michelle and request transportation arrangements for pt.

## 2014-01-04 ENCOUNTER — Emergency Department (HOSPITAL_COMMUNITY)
Admission: EM | Admit: 2014-01-04 | Discharge: 2014-01-04 | Disposition: A | Discharge status: Home / Self Care | Payer: Self-pay | Attending: Emergency Medicine | Admitting: Emergency Medicine

## 2014-01-04 ENCOUNTER — Encounter (HOSPITAL_COMMUNITY): Payer: Self-pay | Admitting: Emergency Medicine

## 2014-01-04 DIAGNOSIS — F17200 Nicotine dependence, unspecified, uncomplicated: Secondary | ICD-10-CM | POA: Insufficient documentation

## 2014-01-04 DIAGNOSIS — D72819 Decreased white blood cell count, unspecified: Secondary | ICD-10-CM | POA: Insufficient documentation

## 2014-01-04 DIAGNOSIS — B349 Viral infection, unspecified: Secondary | ICD-10-CM

## 2014-01-04 DIAGNOSIS — F329 Major depressive disorder, single episode, unspecified: Secondary | ICD-10-CM | POA: Insufficient documentation

## 2014-01-04 DIAGNOSIS — Z79899 Other long term (current) drug therapy: Secondary | ICD-10-CM | POA: Insufficient documentation

## 2014-01-04 DIAGNOSIS — R110 Nausea: Secondary | ICD-10-CM | POA: Insufficient documentation

## 2014-01-04 DIAGNOSIS — B9789 Other viral agents as the cause of diseases classified elsewhere: Secondary | ICD-10-CM | POA: Insufficient documentation

## 2014-01-04 DIAGNOSIS — F3289 Other specified depressive episodes: Secondary | ICD-10-CM | POA: Insufficient documentation

## 2014-01-04 HISTORY — DX: Anxiety disorder, unspecified: F41.9

## 2014-01-04 LAB — CBC WITH DIFFERENTIAL/PLATELET
BASOS ABS: 0 10*3/uL (ref 0.0–0.1)
BASOS PCT: 0 % (ref 0–1)
EOS PCT: 0 % (ref 0–5)
Eosinophils Absolute: 0 10*3/uL (ref 0.0–0.7)
HEMATOCRIT: 40.3 % (ref 39.0–52.0)
Hemoglobin: 13.6 g/dL (ref 13.0–17.0)
Lymphocytes Relative: 26 % (ref 12–46)
Lymphs Abs: 0.7 10*3/uL (ref 0.7–4.0)
MCH: 27.8 pg (ref 26.0–34.0)
MCHC: 33.7 g/dL (ref 30.0–36.0)
MCV: 82.4 fL (ref 78.0–100.0)
MONO ABS: 0.4 10*3/uL (ref 0.1–1.0)
Monocytes Relative: 13 % — ABNORMAL HIGH (ref 3–12)
Neutro Abs: 1.7 10*3/uL (ref 1.7–7.7)
Neutrophils Relative %: 61 % (ref 43–77)
Platelets: 143 10*3/uL — ABNORMAL LOW (ref 150–400)
RBC: 4.89 MIL/uL (ref 4.22–5.81)
RDW: 15.1 % (ref 11.5–15.5)
WBC: 2.8 10*3/uL — ABNORMAL LOW (ref 4.0–10.5)

## 2014-01-04 MED ORDER — ACETAMINOPHEN 325 MG PO TABS
650.0000 mg | ORAL_TABLET | Freq: Once | ORAL | Status: AC
  Administered 2014-01-04: 650 mg via ORAL
  Filled 2014-01-04: qty 2

## 2014-01-04 MED ORDER — ONDANSETRON 4 MG PO TBDP
4.0000 mg | ORAL_TABLET | Freq: Once | ORAL | Status: AC
  Administered 2014-01-04: 4 mg via ORAL
  Filled 2014-01-04: qty 1

## 2014-01-04 MED ORDER — SERTRALINE HCL 50 MG PO TABS: 50.0000 mg | ORAL_TABLET | Freq: Every day | ORAL | Status: DC

## 2014-01-04 NOTE — ED Notes (Signed)
MD at bedside. Dr. James at bedside.  

## 2014-01-04 NOTE — ED Provider Notes (Signed)
CSN: [id number]     Arrival date & time 01/04/14  1949 History   First MD Initiated Contact with Patient 01/04/14 2006     Chief Complaint  Patient presents with  . Fever      HPI  Patient presents here stating self-limited and fevers for 3 days. He was in inpatient treatment for detox last few days. He left. He appears. He came here. He states that he's had fever asymptomatic had fever. No vomiting. No headache. No sore throat. No neck pain no chest pain. Mild nausea.  Past Medical History  Diagnosis Date  . Alcohol abuse   . Depression    History reviewed. No pertinent past surgical history. No family history on file. History  Substance Use Topics  . Smoking status: Current Every Day Smoker -- 1.00 packs/day    Types: Cigarettes  . Smokeless tobacco: Not on file  . Alcohol Use: 0.6 oz/week    1 Cans of beer per week     Comment: a case a day; pt told this writer 12 pk daily or a fifth of vodka    Review of Systems  Constitutional: Positive for fever. Negative for chills, diaphoresis, appetite change and fatigue.  HENT: Negative for mouth sores, sore throat and trouble swallowing.   Eyes: Negative for visual disturbance.  Respiratory: Negative for cough, chest tightness, shortness of breath and wheezing.   Cardiovascular: Negative for chest pain.  Gastrointestinal: Positive for nausea. Negative for vomiting, abdominal pain, diarrhea and abdominal distention.  Endocrine: Negative for polydipsia, polyphagia and polyuria.  Genitourinary: Negative for dysuria, frequency and hematuria.  Musculoskeletal: Negative for gait problem.  Skin: Negative for color change, pallor and rash.  Neurological: Negative for dizziness, syncope, light-headedness and headaches.  Hematological: Does not bruise/bleed easily.  Psychiatric/Behavioral: Negative for behavioral problems and confusion.      Allergies  Shellfish allergy  Home Medications   Current Outpatient Rx  Name  Route  Sig   Dispense  Refill  . ChlordiazePOXIDE HCl (LIBRIUM PO)   Oral   Take 1 tablet by mouth daily.          BP 113/71  Pulse 98  Temp(Src) 99.4 F (37.4 C) (Oral)  Resp 17  Ht 6\' 3"  (1.905 m)  Wt 160 lb (72.576 kg)  BMI 20.00 kg/m2  SpO2 98% Physical Exam  Constitutional: He is oriented to person, place, and time. He appears well-developed and well-nourished. No distress.  HENT:  Head: Normocephalic.  No pharyngitis  Eyes: Conjunctivae are normal. Pupils are equal, round, and reactive to light. No scleral icterus.  Neck: Normal range of motion. Neck supple. No thyromegaly present.  Cardiovascular: Normal rate and regular rhythm.  Exam reveals no gallop and no friction rub.   No murmur heard. Pulmonary/Chest: Effort normal and breath sounds normal. No respiratory distress. He has no wheezes. He has no rales.  Abdominal: Soft. Bowel sounds are normal. He exhibits no distension. There is no tenderness. There is no rebound.  Musculoskeletal: Normal range of motion.  Neurological: He is alert and oriented to person, place, and time.  Skin: Skin is warm and dry. No rash noted.  Psychiatric: He has a normal mood and affect. His behavior is normal.    ED Course  Procedures (including critical care time) Labs Review Labs Reviewed  CBC WITH DIFFERENTIAL - Abnormal; Notable for the following:    WBC 2.8 (*)    Platelets 143 (*)    Monocytes Relative 13 (*)  All other components within normal limits   Imaging Review No results found.   EKG Interpretation None      MDM   Final diagnoses:  Viral syndrome    Leukopenia, without neutropenia. Feels well. It is appropriate for symptomatic treatment at home    Rolland Porter, MD 01/04/14 2327

## 2014-01-04 NOTE — ED Notes (Signed)
Pt ambulatory to exam room with steady gait. Pt states he has generalized body aches and pain behind his eyes. Pt alert, no acute distress. Skin warm, dry.

## 2014-01-04 NOTE — ED Notes (Signed)
Pt reports fever over 100 for past 3 days and with generalized pain all over body. Pt denies cough. Positive for n/v/d. Pt not SOB or in NAD.

## 2014-01-04 NOTE — Discharge Instructions (Signed)
Viral Infections °A virus is a type of germ. Viruses can cause: °· Minor sore throats. °· Aches and pains. °· Headaches. °· Runny nose. °· Rashes. °· Watery eyes. °· Tiredness. °· Coughs. °· Loss of appetite. °· Feeling sick to your stomach (nausea). °· Throwing up (vomiting). °· Watery poop (diarrhea). °HOME CARE  °· Only take medicines as told by your doctor. °· Drink enough water and fluids to keep your pee (urine) clear or pale yellow. Sports drinks are a good choice. °· Get plenty of rest and eat healthy. Soups and broths with crackers or rice are fine. °GET HELP RIGHT AWAY IF:  °· You have a very bad headache. °· You have shortness of breath. °· You have chest pain or neck pain. °· You have an unusual rash. °· You cannot stop throwing up. °· You have watery poop that does not stop. °· You cannot keep fluids down. °· You or your child has a temperature by mouth above 102° F (38.9° C), not controlled by medicine. °· Your baby is older than 3 months with a rectal temperature of 102° F (38.9° C) or higher. °· Your baby is 3 months old or younger with a rectal temperature of 100.4° F (38° C) or higher. °MAKE SURE YOU:  °· Understand these instructions. °· Will watch this condition. °· Will get help right away if you are not doing well or get worse. °Document Released: 10/03/2008 Document Revised: 01/13/2012 Document Reviewed: 02/26/2011 °Exit[employer]® Patient Information ©2014 [employer]. ° °

## 2014-01-13 ENCOUNTER — Encounter (HOSPITAL_COMMUNITY): Payer: Self-pay | Admitting: *Deleted

## 2014-01-13 ENCOUNTER — Emergency Department (HOSPITAL_COMMUNITY): Payer: Self-pay

## 2014-01-13 ENCOUNTER — Emergency Department (HOSPITAL_COMMUNITY)
Admission: EM | Admit: 2014-01-13 | Discharge: 2014-01-14 | Disposition: A | Discharge status: Home / Self Care | Payer: Self-pay | Attending: Emergency Medicine | Admitting: Emergency Medicine

## 2014-01-13 ENCOUNTER — Encounter (HOSPITAL_COMMUNITY): Payer: Self-pay | Admitting: Emergency Medicine

## 2014-01-13 DIAGNOSIS — R51 Headache: Secondary | ICD-10-CM | POA: Insufficient documentation

## 2014-01-13 DIAGNOSIS — F1020 Alcohol dependence, uncomplicated: Secondary | ICD-10-CM | POA: Insufficient documentation

## 2014-01-13 DIAGNOSIS — F17200 Nicotine dependence, unspecified, uncomplicated: Secondary | ICD-10-CM | POA: Insufficient documentation

## 2014-01-13 DIAGNOSIS — R209 Unspecified disturbances of skin sensation: Secondary | ICD-10-CM | POA: Insufficient documentation

## 2014-01-13 DIAGNOSIS — F1410 Cocaine abuse, uncomplicated: Secondary | ICD-10-CM | POA: Insufficient documentation

## 2014-01-13 LAB — CBC
HCT: 38.1 % — ABNORMAL LOW (ref 39.0–52.0)
Hemoglobin: 12.9 g/dL — ABNORMAL LOW (ref 13.0–17.0)
MCH: 27.3 pg (ref 26.0–34.0)
MCHC: 33.9 g/dL (ref 30.0–36.0)
MCV: 80.7 fL (ref 78.0–100.0)
PLATELETS: 453 10*3/uL — AB (ref 150–400)
RBC: 4.72 MIL/uL (ref 4.22–5.81)
RDW: 15.5 % (ref 11.5–15.5)
WBC: 6.5 10*3/uL (ref 4.0–10.5)

## 2014-01-13 LAB — URINALYSIS, ROUTINE W REFLEX MICROSCOPIC
BILIRUBIN URINE: NEGATIVE
GLUCOSE, UA: NEGATIVE mg/dL
Hgb urine dipstick: NEGATIVE
KETONES UR: NEGATIVE mg/dL
LEUKOCYTES UA: NEGATIVE
Nitrite: NEGATIVE
PROTEIN: NEGATIVE mg/dL
Specific Gravity, Urine: 1.013 (ref 1.005–1.030)
Urobilinogen, UA: 0.2 mg/dL (ref 0.0–1.0)
pH: 5 (ref 5.0–8.0)

## 2014-01-13 LAB — COMPREHENSIVE METABOLIC PANEL
ALT: 26 U/L (ref 0–53)
AST: 34 U/L (ref 0–37)
Albumin: 3.8 g/dL (ref 3.5–5.2)
Alkaline Phosphatase: 76 U/L (ref 39–117)
BUN: 8 mg/dL (ref 6–23)
CALCIUM: 9.3 mg/dL (ref 8.4–10.5)
CO2: 23 mEq/L (ref 19–32)
Chloride: 98 mEq/L (ref 96–112)
Creatinine, Ser: 0.9 mg/dL (ref 0.50–1.35)
GFR calc Af Amer: >90 mL/min (ref >90)
GFR calc non Af Amer: >90 mL/min (ref >90)
GLUCOSE: 121 mg/dL — AB (ref 70–99)
Potassium: 3.9 mEq/L (ref 3.7–5.3)
SODIUM: 138 meq/L (ref 137–147)
Total Bilirubin: 0.3 mg/dL (ref 0.3–1.2)
Total Protein: 8 g/dL (ref 6.0–8.3)

## 2014-01-13 LAB — RAPID URINE DRUG SCREEN, HOSP PERFORMED
AMPHETAMINES: NOT DETECTED
BENZODIAZEPINES: NOT DETECTED
Barbiturates: NOT DETECTED
Cocaine: POSITIVE — AB
Opiates: NOT DETECTED
Tetrahydrocannabinol: NOT DETECTED

## 2014-01-13 LAB — SALICYLATE LEVEL

## 2014-01-13 LAB — ACETAMINOPHEN LEVEL: Acetaminophen (Tylenol), Serum: <15 ug/mL (ref 10–30)

## 2014-01-13 LAB — ETHANOL: Alcohol, Ethyl (B): 194 mg/dL — ABNORMAL HIGH (ref 0–11)

## 2014-01-13 LAB — LIPASE, BLOOD: Lipase: 66 U/L — ABNORMAL HIGH (ref 11–59)

## 2014-01-13 MED ORDER — THIAMINE HCL 100 MG/ML IJ SOLN: 100.0000 mg | Freq: Every day | INTRAMUSCULAR | Status: DC

## 2014-01-13 MED ORDER — VITAMIN B-1 100 MG PO TABS: 100.0000 mg | ORAL_TABLET | Freq: Every day | ORAL | Status: DC

## 2014-01-13 MED ORDER — ZOLPIDEM TARTRATE 5 MG PO TABS: 5.0000 mg | ORAL_TABLET | Freq: Every evening | ORAL | Status: DC | PRN

## 2014-01-13 MED ORDER — ONDANSETRON HCL 4 MG PO TABS: 4.0000 mg | ORAL_TABLET | Freq: Three times a day (TID) | ORAL | Status: DC | PRN

## 2014-01-13 MED ORDER — LORAZEPAM 1 MG PO TABS: 0.0000 mg | ORAL_TABLET | Freq: Two times a day (BID) | ORAL | Status: DC

## 2014-01-13 MED ORDER — IBUPROFEN 200 MG PO TABS: 600.0000 mg | ORAL_TABLET | Freq: Three times a day (TID) | ORAL | Status: DC | PRN

## 2014-01-13 MED ORDER — LORAZEPAM 1 MG PO TABS
0.0000 mg | ORAL_TABLET | Freq: Four times a day (QID) | ORAL | Status: DC
  Administered 2014-01-13: 1 mg via ORAL
  Filled 2014-01-13: qty 1

## 2014-01-13 MED ORDER — NICOTINE 21 MG/24HR TD PT24: 21.0000 mg | MEDICATED_PATCH | Freq: Every day | TRANSDERMAL | Status: DC

## 2014-01-13 NOTE — [facility] Counselor (Signed)
[facility] by Alberteen SamFran Hobson, NP, once medically cleared.  #[id number].

## 2014-01-13 NOTE — BH Assessment (Signed)
Chad Assessment Note   Chad Sanchez is a 43 y.o. male who is walk in with [facility] requested alcohol and cocaine detox.  Pt denies SI/HI/AVH.  Pt reports that he has been drinking daily, since d/c from previous detox in 12/2013.  Pt says that he is entering a rehab facility([address]) for long term treatment and is required to detox before program can begin. Pt drinks 12 pk of daily and says he last consumed alcohol today.  He drank 3-40's.  Pt also uses at least 1 gram of cocaine, monthly.  Pt reports last use was today and he used less than 1/2 gram.  Pt is c/o w/d sxs: anxiety and tremors.  Pt has no issues with seizures or blackouts.  He has no current legal problems.  Pt has been sent to emerg dept for medical clearance.    Axis I: Alcohol use disorder, Severe; Cocaine use disorder, Mild Axis II: Deferred Axis III:  Past Medical History  Diagnosis Date  . Alcohol abuse   . Depression   . Anxiety    Axis IV: economic problems, occupational problems, other psychosocial or environmental problems, problems related to social environment and problems with primary support group Axis V: 51-60 moderate symptoms  Past Medical History:  Past Medical History  Diagnosis Date  . Alcohol abuse   . Depression   . Anxiety     History reviewed. No pertinent past surgical history.  Family History: No family history on file.  Social History:  reports that he has been smoking Cigarettes.  He has been smoking about 1.00 pack per day. He does not have any smokeless tobacco history on file. He reports that he drinks about 0.6 ounces of alcohol per week. He reports that he uses illicit drugs ("Crack" cocaine and Cocaine).  Additional Social History:  Alcohol / Drug Use Pain Medications: See MAR  Prescriptions: See MAR  Over the Counter: See MAR  History of alcohol / drug use?: Yes Longest period of sobriety (when/how long): Only when in detox  Negative Consequences of Use: Work /  School;Personal relationships;Financial Withdrawal Symptoms: Tremors;Other (Comment) (Anxiety ) Substance #1 Name of Substance 1: Alcohol  1 - Age of First Use: Teens  1 - Amount (size/oz): 12 PK  1 - Frequency: Daily  1 - Duration: On-going  1 - Last Use / Amount: 01/13/14 Substance #2 Name of Substance 2: Cocaine  2 - Age of First Use: 20's  2 - Amount (size/oz): 1 Gram  2 - Frequency: Monthly  2 - Duration: On-going 2 - Last Use / Amount: 01/13/14  CIWA: CIWA-Ar BP: 113/71 mmHg Pulse Rate: 98 COWS:    Allergies:  Allergies  Allergen Reactions  . Shellfish Allergy Anaphylaxis    Home Medications:  (Not in a hospital admission)  OB/GYN Status:  No LMP for male patient.  General Assessment Data Location of Assessment: [facility] Assessment Services Is this a Chad or Face-to-Face Assessment?: Face-to-Face Is this an Initial Assessment or a Re-assessment for this encounter?: Initial Assessment Living Arrangements: Alone Can pt return to current living arrangement?: Yes Admission Status: Voluntary Is patient capable of signing voluntary admission?: Yes Transfer from: Acute Hospital Referral Source: MD  Medical Screening Exam [facility] Walk-in ONLY) Medical Exam completed: No Reason for MSE not completed: Other:  [facility] Crisis Care Plan Living Arrangements: Alone Name of Psychiatrist: None  Name of Therapist: None   Education Status Is patient currently in school?: No Current Grade: None  Highest grade of  school patient has completed: None  Name of school: None  Contact person: None   Risk to self Suicidal Ideation: No Suicidal Intent: No Is patient at risk for suicide?: No Suicidal Plan?: No Access to Means: No What has been your use of drugs/alcohol within the last 12 months?: Absuing: alcohol, cocaine  Previous Attempts/Gestures: No How many times?: 0 Other Self Harm Risks: None  Triggers for Past Attempts: None known Intentional Self Injurious Behavior:  None Family Suicide History: No Recent stressful life event(s): Other (Comment) (Chronicity ) Persecutory voices/beliefs?: No Depression: Yes Depression Symptoms: Loss of interest in usual pleasures Substance abuse history and/or treatment for substance abuse?: Yes Suicide prevention information given to non-admitted patients: Not applicable  Risk to Others Homicidal Ideation: No Thoughts of Harm to Others: No Current Homicidal Intent: No Current Homicidal Plan: No Access to Homicidal Means: No Identified Victim: None  History of harm to others?: No Assessment of Violence: None Noted Violent Behavior Description: None  Does patient have access to weapons?: No Criminal Charges Pending?: No Does patient have a court date: No  Psychosis Hallucinations: None noted Delusions: None noted  Mental Status Report Appear/Hygiene: Disheveled;Poor hygiene Eye Contact: Good Motor Activity: Unremarkable Speech: Logical/coherent Level of Consciousness: Alert Mood: Depressed Affect: Appropriate to circumstance;Depressed Anxiety Level: None Thought Processes: Coherent;Relevant Judgement: Unimpaired Orientation: Person;Place;Time;Situation Obsessive Compulsive Thoughts/Behaviors: None  Cognitive Functioning Concentration: Normal Memory: Recent Intact;Remote Intact IQ: Average Insight: Fair Impulse Control: Fair Appetite: Good Weight Loss: 0 Weight Gain: 0 Sleep: No Change Total Hours of Sleep: 6 Vegetative Symptoms: None  ADLScreening [facility]) Patient's cognitive ability adequate to safely complete daily activities?: Yes Patient able to express need for assistance with ADLs?: Yes Independently performs ADLs?: Yes (appropriate for developmental age)  Prior Inpatient Therapy Prior Inpatient Therapy: Yes Prior Therapy Dates: 2014, 2015 Prior Therapy Facilty/Provider(s): [facility], [facility] Reason for Treatment: Detox   Prior Outpatient Therapy Prior Outpatient  Therapy: No Prior Therapy Dates: None  Prior Therapy Facilty/Provider(s): None  Reason for Treatment: None   ADL Screening (condition at time of admission) Patient's cognitive ability adequate to safely complete daily activities?: Yes Is the patient deaf or have difficulty hearing?: No Does the patient have difficulty seeing, even when wearing glasses/contacts?: No Does the patient have difficulty concentrating, remembering, or making decisions?: No Patient able to express need for assistance with ADLs?: Yes Does the patient have difficulty dressing or bathing?: No Independently performs ADLs?: Yes (appropriate for developmental age) Does the patient have difficulty walking or climbing stairs?: No Weakness of Legs: None Weakness of Arms/Hands: None  Home Assistive Devices/Equipment Home Assistive Devices/Equipment: None  Therapy Consults (therapy consults require a physician order) PT Evaluation Needed: No OT Evalulation Needed: No SLP Evaluation Needed: No Abuse/Neglect Assessment (Assessment to be complete while patient is alone) Physical Abuse: Denies Verbal Abuse: Denies Sexual Abuse: Denies Exploitation of patient/patient's resources: Denies Self-Neglect: Denies Values / Beliefs Cultural Requests During Hospitalization: None Spiritual Requests During Hospitalization: None Consults Spiritual Care Consult Needed: No Social Work Consult Needed: No Merchant navy officer (For Healthcare) Advance Directive: Patient does not have advance directive;Patient would not like information Pre-existing out of facility DNR order (yellow form or pink MOST form): No Nutrition Screen- [facility] Adult/WL/AP Patient's home diet: Regular  Additional Information 1:1 In Past 12 Months?: No CIRT Risk: No Elopement Risk: No Does patient have medical clearance?: Yes     Disposition:  Disposition Initial Assessment Completed for this Encounter: Yes Disposition of Patient: Inpatient treatment  program;Referred to [facility] ) Type of inpatient treatment program: Adult Patient referred to: Other (Comment) ([facility] )  Murrell ReddenSimmons, Anaka Beazer C 01/13/2014 9:25 PM

## 2014-01-13 NOTE — ED Provider Notes (Signed)
CSN: [id number]     Arrival date & time 01/13/14  2111 History  This chart was scribed for non-physician practitioner, Ivonne Andrew, PA-C,working with Ward Givens, MD, by Karle Plumber, ED Scribe.  This patient was seen in room WTR4/WLPT4 and the patient's care was started at 10:35 PM.  Chief Complaint  Patient presents with  . Medical Clearance   The history is provided by the patient. No language interpreter was used.   HPI Comments:  Chad Sanchez is a 43 y.o. male who presents to the Emergency Department needing medical clearance for detox. He states he wants to stop drinking alcohol. He reports using cocaine yesterday, but states that is not his primary issue. He reports drinking approximately one case of beer daily. He states he has been drinking for over twenty years. He reports a severe HA, numbness of his hands, and paresthesias of BLE for the past week. He reports a fever for the past week ranging from 99.5 degrees to 100 degrees. He reports trembles. Pt states is normally able to eat nightly after he drinks but states he does not normally eat in the morning time. He denies abdominal pain, vomiting, diarrhea, dysuria, urinary frequency, or hematuria. He denies h/o DM. He denies IV drug use. He denies suicidal or homicidal ideations.  Past Medical History  Diagnosis Date  . Alcohol abuse   . Depression   . Anxiety    Past Surgical History  Procedure Laterality Date  . Tonsillectomy     History reviewed. No pertinent family history. History  Substance Use Topics  . Smoking status: Current Every Day Smoker -- 1.00 packs/day    Types: Cigarettes  . Smokeless tobacco: Never Used  . Alcohol Use: 0.6 oz/week    1 Cans of beer per week     Comment: a case a day; pt told this writer 12 pk daily or a fifth of vodka    Review of Systems  Gastrointestinal: Negative for vomiting, abdominal pain and diarrhea.  Genitourinary: Negative for dysuria and hematuria.  Neurological:  Positive for numbness and headaches.  Psychiatric/Behavioral: Negative for suicidal ideas.  All other systems reviewed and are negative.   Allergies  Shellfish allergy  Home Medications  No current outpatient prescriptions on file. Triage Vitals: BP 116/74  Pulse 116  Temp(Src) 98.4 F (36.9 C)  Resp 18  SpO2 98% Physical Exam  Nursing note and vitals reviewed. Constitutional: He is oriented to person, place, and time. He appears well-developed and well-nourished. No distress.  HENT:  Head: Normocephalic and atraumatic.  Eyes: EOM are normal.  Neck: Normal range of motion.  Cardiovascular: Normal rate, regular rhythm and normal heart sounds.  Exam reveals no gallop and no friction rub.   No murmur heard. Pulmonary/Chest: Effort normal and breath sounds normal. No respiratory distress. He has no wheezes. He has no rales.  Abdominal: Soft. He exhibits no distension and no mass. There is no tenderness. There is no rebound and no guarding.  Musculoskeletal: Normal range of motion.  Neurological: He is alert and oriented to person, place, and time.  Skin: Skin is warm and dry.  Psychiatric: He has a normal mood and affect. His behavior is normal.    ED Course  Procedures  DIAGNOSTIC STUDIES: Oxygen Saturation is 98% on RA, normal by my interpretation.   COORDINATION OF CARE: 10:40 PM- Will order standard medical clearance labs and a CXR. Pt verbalizes understanding and agrees to plan.  Patient appears well. Denies any  abdominal pains. Abdomen soft. He is afebrile. Lab testing shows normal WBC. Negative UA and chest x-ray. No signs to suggest a cause of his reported fevers at home. No concerning findings to suggest a significant infection. At this time he is medically cleared and may followup for his alcohol detox.   Results for orders placed during the hospital encounter of 01/13/14  ACETAMINOPHEN LEVEL      Result Value Ref Range   Acetaminophen (Tylenol), Serum <15.0  10 -  30 ug/mL  CBC      Result Value Ref Range   WBC 6.5  4.0 - 10.5 K/uL   RBC 4.72  4.22 - 5.81 MIL/uL   Hemoglobin 12.9 (*) 13.0 - 17.0 g/dL   HCT 16.1 (*) 09.6 - 04.5 %   MCV 80.7  78.0 - 100.0 fL   MCH 27.3  26.0 - 34.0 pg   MCHC 33.9  30.0 - 36.0 g/dL   RDW 40.9  81.1 - 91.4 %   Platelets 453 (*) 150 - 400 K/uL  COMPREHENSIVE METABOLIC PANEL      Result Value Ref Range   Sodium 138  137 - 147 mEq/L   Potassium 3.9  3.7 - 5.3 mEq/L   Chloride 98  96 - 112 mEq/L   CO2 23  19 - 32 mEq/L   Glucose, Bld 121 (*) 70 - 99 mg/dL   BUN 8  6 - 23 mg/dL   Creatinine, Ser 7.82  0.50 - 1.35 mg/dL   Calcium 9.3  8.4 - 95.6 mg/dL   Total Protein 8.0  6.0 - 8.3 g/dL   Albumin 3.8  3.5 - 5.2 g/dL   AST 34  0 - 37 U/L   ALT 26  0 - 53 U/L   Alkaline Phosphatase 76  39 - 117 U/L   Total Bilirubin 0.3  0.3 - 1.2 mg/dL   GFR calc non Af Amer >90  >90 mL/min   GFR calc Af Amer >90  >90 mL/min  ETHANOL      Result Value Ref Range   Alcohol, Ethyl (B) 194 (*) 0 - 11 mg/dL  SALICYLATE LEVEL      Result Value Ref Range   Salicylate Lvl <2.0 (*) 2.8 - 20.0 mg/dL  URINE RAPID DRUG SCREEN (HOSP PERFORMED)      Result Value Ref Range   Opiates NONE DETECTED  NONE DETECTED   Cocaine POSITIVE (*) NONE DETECTED   Benzodiazepines NONE DETECTED  NONE DETECTED   Amphetamines NONE DETECTED  NONE DETECTED   Tetrahydrocannabinol NONE DETECTED  NONE DETECTED   Barbiturates NONE DETECTED  NONE DETECTED  URINALYSIS, ROUTINE W REFLEX MICROSCOPIC      Result Value Ref Range   Color, Urine YELLOW  YELLOW   APPearance CLEAR  CLEAR   Specific Gravity, Urine 1.013  1.005 - 1.030   pH 5.0  5.0 - 8.0   Glucose, UA NEGATIVE  NEGATIVE mg/dL   Hgb urine dipstick NEGATIVE  NEGATIVE   Bilirubin Urine NEGATIVE  NEGATIVE   Ketones, ur NEGATIVE  NEGATIVE mg/dL   Protein, ur NEGATIVE  NEGATIVE mg/dL   Urobilinogen, UA 0.2  0.0 - 1.0 mg/dL   Nitrite NEGATIVE  NEGATIVE   Leukocytes, UA NEGATIVE  NEGATIVE  LIPASE, BLOOD       Result Value Ref Range   Lipase 66 (*) 11 - 59 U/L      Imaging Review Dg Chest 2 View  01/14/2014   CLINICAL DATA:  Medical  clearance, smoker.  EXAM: CHEST  2 VIEW  COMPARISON:  None.  FINDINGS: Lungs are mildly hyperexpanded. No confluent airspace opacity, pleural effusion, or pneumothorax. Cardiomediastinal contours within normal range. No acute osseous finding.  IMPRESSION: No radiographic evidence of an acute cardiopulmonary process.   Electronically Signed   By: Jearld LeschAndrew  DelGaizo M.D.   On: 01/14/2014 00:13     MDM   Final diagnoses:  Alcohol dependence      I personally performed the services described in this documentation, which was scribed in my presence. The recorded information has been reviewed and is accurate.    Angus SellerPeter S Orpah Hausner, PA-C 01/14/14 [id number]

## 2014-01-13 NOTE — ED Notes (Signed)
Pt reports [facility] is [facility] from ETOH, states his last drink was at 1300 today. Reports that he has been having a fever, HA and numbness to his bilateral legs for the past month. States he needs to be evaluated for this before being admitted into the detox program. Pt a&o x4, denies SI/HI or AVH.

## 2014-01-14 ENCOUNTER — Inpatient Hospital Stay (HOSPITAL_COMMUNITY)
Admission: RE | Admit: 2014-01-14 | Discharge: 2014-01-17 | DRG: 897 | Disposition: A | Discharge status: Home / Self Care | Payer: Federal, State, Local not specified - Other | Attending: Psychiatry | Admitting: Psychiatry

## 2014-01-14 ENCOUNTER — Encounter (HOSPITAL_COMMUNITY): Payer: Self-pay

## 2014-01-14 DIAGNOSIS — F1020 Alcohol dependence, uncomplicated: Principal | ICD-10-CM | POA: Diagnosis present

## 2014-01-14 DIAGNOSIS — F1994 Other psychoactive substance use, unspecified with psychoactive substance-induced mood disorder: Secondary | ICD-10-CM | POA: Diagnosis present

## 2014-01-14 DIAGNOSIS — F320 Major depressive disorder, single episode, mild: Secondary | ICD-10-CM | POA: Diagnosis present

## 2014-01-14 DIAGNOSIS — Z91199 Patient's noncompliance with other medical treatment and regimen due to unspecified reason: Secondary | ICD-10-CM

## 2014-01-14 DIAGNOSIS — F17200 Nicotine dependence, unspecified, uncomplicated: Secondary | ICD-10-CM | POA: Diagnosis present

## 2014-01-14 DIAGNOSIS — F411 Generalized anxiety disorder: Secondary | ICD-10-CM | POA: Diagnosis present

## 2014-01-14 DIAGNOSIS — Z9119 Patient's noncompliance with other medical treatment and regimen: Secondary | ICD-10-CM

## 2014-01-14 MED ORDER — CHLORDIAZEPOXIDE HCL 25 MG PO CAPS: 25.0000 mg | ORAL_CAPSULE | ORAL | Status: DC

## 2014-01-14 MED ORDER — METHOCARBAMOL 500 MG PO TABS: 500.0000 mg | ORAL_TABLET | Freq: Three times a day (TID) | ORAL | Status: DC | PRN

## 2014-01-14 MED ORDER — CLONIDINE HCL 0.1 MG PO TABS: 0.1000 mg | ORAL_TABLET | Freq: Every day | ORAL | Status: DC

## 2014-01-14 MED ORDER — LOPERAMIDE HCL 2 MG PO CAPS: 2.0000 mg | ORAL_CAPSULE | ORAL | Status: AC | PRN

## 2014-01-14 MED ORDER — HYDROXYZINE HCL 25 MG PO TABS
25.0000 mg | ORAL_TABLET | Freq: Four times a day (QID) | ORAL | Status: AC | PRN
  Administered 2014-01-15 – 2014-01-16 (×2): 25 mg via ORAL
  Filled 2014-01-14: qty 1

## 2014-01-14 MED ORDER — ADULT MULTIVITAMIN W/MINERALS CH
1.0000 | ORAL_TABLET | Freq: Every day | ORAL | Status: DC
  Administered 2014-01-14 – 2014-01-16 (×2): 1 via ORAL
  Filled 2014-01-14 (×5): qty 1

## 2014-01-14 MED ORDER — GABAPENTIN 100 MG PO CAPS
100.0000 mg | ORAL_CAPSULE | Freq: Three times a day (TID) | ORAL | Status: DC
  Administered 2014-01-14 (×2): 100 mg via ORAL
  Filled 2014-01-14 (×13): qty 1

## 2014-01-14 MED ORDER — ALUM & MAG HYDROXIDE-SIMETH 200-200-20 MG/5ML PO SUSP: 30.0000 mL | ORAL | Status: DC | PRN

## 2014-01-14 MED ORDER — ONDANSETRON 4 MG PO TBDP: 4.0000 mg | ORAL_TABLET | Freq: Four times a day (QID) | ORAL | Status: DC | PRN

## 2014-01-14 MED ORDER — TRAZODONE HCL 50 MG PO TABS
50.0000 mg | ORAL_TABLET | Freq: Every evening | ORAL | Status: DC | PRN
  Administered 2014-01-16: 50 mg via ORAL
  Filled 2014-01-14: qty 1
  Filled 2014-01-14: qty 14
  Filled 2014-01-14: qty 1

## 2014-01-14 MED ORDER — CHLORDIAZEPOXIDE HCL 25 MG PO CAPS
50.0000 mg | ORAL_CAPSULE | Freq: Once | ORAL | Status: AC
  Administered 2014-01-14: 50 mg via ORAL
  Filled 2014-01-14: qty 2

## 2014-01-14 MED ORDER — CLONIDINE HCL 0.1 MG PO TABS
0.1000 mg | ORAL_TABLET | Freq: Four times a day (QID) | ORAL | Status: AC
  Administered 2014-01-14 – 2014-01-16 (×7): 0.1 mg via ORAL
  Filled 2014-01-14 (×10): qty 1

## 2014-01-14 MED ORDER — CLONIDINE HCL 0.1 MG PO TABS
0.1000 mg | ORAL_TABLET | ORAL | Status: DC
  Filled 2014-01-14 (×4): qty 1

## 2014-01-14 MED ORDER — CHLORDIAZEPOXIDE HCL 25 MG PO CAPS
25.0000 mg | ORAL_CAPSULE | Freq: Four times a day (QID) | ORAL | Status: AC
  Administered 2014-01-14 (×4): 25 mg via ORAL
  Filled 2014-01-14 (×4): qty 1

## 2014-01-14 MED ORDER — CHLORDIAZEPOXIDE HCL 25 MG PO CAPS: 25.0000 mg | ORAL_CAPSULE | Freq: Four times a day (QID) | ORAL | Status: AC | PRN

## 2014-01-14 MED ORDER — DICYCLOMINE HCL 20 MG PO TABS: 20.0000 mg | ORAL_TABLET | Freq: Four times a day (QID) | ORAL | Status: DC | PRN

## 2014-01-14 MED ORDER — CHLORDIAZEPOXIDE HCL 25 MG PO CAPS: 25.0000 mg | ORAL_CAPSULE | Freq: Every day | ORAL | Status: DC

## 2014-01-14 MED ORDER — NAPROXEN 500 MG PO TABS: 500.0000 mg | ORAL_TABLET | Freq: Two times a day (BID) | ORAL | Status: DC | PRN

## 2014-01-14 MED ORDER — ACETAMINOPHEN 325 MG PO TABS
650.0000 mg | ORAL_TABLET | Freq: Four times a day (QID) | ORAL | Status: DC | PRN
Start: 2014-01-14 — End: 2014-01-17

## 2014-01-14 MED ORDER — CHLORDIAZEPOXIDE HCL 25 MG PO CAPS
25.0000 mg | ORAL_CAPSULE | Freq: Three times a day (TID) | ORAL | Status: AC
  Filled 2014-01-14 (×2): qty 1

## 2014-01-14 MED ORDER — MAGNESIUM HYDROXIDE 400 MG/5ML PO SUSP: 30.0000 mL | Freq: Every day | ORAL | Status: DC | PRN

## 2014-01-14 NOTE — Discharge Instructions (Signed)
Followup at behavioral health for your auscultation problem as planned.    Alcohol Problems Most adults who drink alcohol drink in moderation (not a lot) are at low risk for developing problems related to their drinking. However, all drinkers, including low-risk drinkers, should know about the health risks connected with drinking alcohol. RECOMMENDATIONS FOR LOW-RISK DRINKING  Drink in moderation. Moderate drinking is defined as follows:   Men - no more than 2 drinks per day.  Nonpregnant women - no more than 1 drink per day.  Over age 44 - no more than 1 drink per day. A standard drink is 12 grams of pure alcohol, which is equal to a 12 ounce bottle of beer or wine cooler, a 5 ounce glass of wine, or 1.5 ounces of distilled spirits (such as whiskey, brandy, vodka, or rum).  ABSTAIN FROM (DO NOT DRINK) ALCOHOL:  When pregnant or considering pregnancy.  When taking a medication that interacts with alcohol.  If you are alcohol dependent.  A medical condition that prohibits drinking alcohol (such as ulcer, liver disease, or heart disease). DISCUSS WITH YOUR CAREGIVER:  If you are at risk for coronary heart disease, discuss the potential benefits and risks of alcohol use: Light to moderate drinking is associated with lower rates of coronary heart disease in certain populations (for example, men over age 44 and postmenopausal women). Infrequent or nondrinkers are advised not to begin light to moderate drinking to reduce the risk of coronary heart disease so as to avoid creating an alcohol-related problem. Similar protective effects can likely be gained through proper diet and exercise.  Women and the elderly have smaller amounts of body water than men. As a result women and the elderly achieve a higher blood alcohol concentration after drinking the same amount of alcohol.  Exposing a fetus to alcohol can cause a broad range of birth defects referred to as Fetal Alcohol Syndrome (FAS) or  Alcohol-Related Birth Defects (ARBD). Although FAS/ARBD is connected with excessive alcohol consumption during pregnancy, studies also have reported neurobehavioral problems in infants born to mothers reporting drinking an average of 1 drink per day during pregnancy.  Heavier drinking (the consumption of more than 4 drinks per occasion by men and more than 3 drinks per occasion by women) impairs learning (cognitive) and psychomotor functions and increases the risk of alcohol-related problems, including accidents and injuries. CAGE QUESTIONS:   Have you ever felt that you should Cut down on your drinking?  Have people Annoyed you by criticizing your drinking?  Have you ever felt bad or Guilty about your drinking?  Have you ever had a drink first thing in the morning to steady your nerves or get rid of a hangover (Eye opener)? If you answered positively to any of these questions: You may be at risk for alcohol-related problems if alcohol consumption is:   Men: Greater than 14 drinks per week or more than 4 drinks per occasion.  Women: Greater than 7 drinks per week or more than 3 drinks per occasion. Do you or your family have a medical history of alcohol-related problems, such as:  Blackouts.  Sexual dysfunction.  Depression.  Trauma.  Liver dysfunction.  Sleep disorders.  Hypertension.  Chronic abdominal pain.  Has your drinking ever caused you problems, such as problems with your family, problems with your work (or school) performance, or accidents/injuries?  Do you have a compulsion to drink or a preoccupation with drinking?  Do you have poor control or are you unable to  stop drinking once you have started?  Do you have to drink to avoid withdrawal symptoms?  Do you have problems with withdrawal such as tremors, nausea, sweats, or mood disturbances?  Does it take more alcohol than in the past to get you high?  Do you feel a strong urge to drink?  Do you change your  plans so that you can have a drink?  Do you ever drink in the morning to relieve the shakes or a hangover? If you have answered a number of the previous questions positively, it may be time for you to talk to your caregivers, family, and friends and see if they think you have a problem. Alcoholism is a chemical dependency that keeps getting worse and will eventually destroy your health and relationships. Many alcoholics end up dead, impoverished, or in prison. This is often the end result of all chemical dependency.  Do not be discouraged if you are not ready to take action immediately.  Decisions to change behavior often involve up and down desires to change and feeling like you cannot decide.  Try to think more seriously about your drinking behavior.  Think of the reasons to quit. WHERE TO GO FOR ADDITIONAL INFORMATION   The National Institute on Alcohol Abuse and Alcoholism (NIAAA) BasicStudents.dk  [employer] on Alcoholism and Drug Dependence (NCADD) www.ncadd.org  American Society of Addiction Medicine (ASAM) RoyalDiary.gl  Document Released: 10/21/2005 Document Revised: 01/13/2012 Document Reviewed: 06/08/2008 [facility] Patient Information 2014 [place], [place].

## 2014-01-14 NOTE — Progress Notes (Signed)
Patient ID: Chad Sanchez, male   DOB: April 22, 1971, 43 y.o.   MRN: [id number]  Admission Note:  D:42 yr male who presents [facility] in no acute distress for the treatment of Detox SI and Depression. Pt appears flat and depressed. Pt was calm and cooperative with admission process. Pt presents with passive SI and contracts for safety upon admission. Pt denies AVH . Pt was brought [facility] by [facility] workers.  To get clean before checking into two year program. Pt stated he's "just tired"   A:Skin was assessed and found to be clear of any abnormal marks apart from a tattoo L neck. POC and unit policies explained and understanding verbalized. Consents obtained. Food and fluids offered, and fluids accepted.  R: Pt had no additional questions or concerns.

## 2014-01-14 NOTE — BHH Suicide Risk Assessment (Signed)
Suicide Risk Assessment  Admission Assessment     Nursing information obtained from:    Demographic factors:    Current Mental Status:    Loss Factors:    Historical Factors:    Risk Reduction Factors:    Total Time spent with patient: 45 minutes  CLINICAL FACTORS:   Severe Anxiety and/or Agitation Alcohol/Substance Abuse/Dependencies  COGNITIVE FEATURES THAT CONTRIBUTE TO RISK:  Closed-mindedness Polarized thinking Thought constriction (tunnel vision)    SUICIDE RISK:   Moderate:  PLAN OF CARE: Supportive approach/coping skills/relapse prevention                                Librium detox/reassess the co morbidities  I certify that inpatient services furnished can reasonably be expected to improve the patient's condition.  Tura Roller A 01/14/2014, 2:02 PM

## 2014-01-14 NOTE — BHH Group Notes (Signed)
[facility] L[facility] Discharge Planning [facility] Note   01/14/2014 9:59 AM  Participation Quality:  DID NOT ATTEND-pt in room sleeping/did not attend group.  Smart, American FinancialHeather LCSWA

## 2014-01-14 NOTE — Progress Notes (Signed)
Adult Psychoeducational Group Note  Date:  01/14/2014 Time:  6:26 PM  Group Topic/Focus:  Early Warning Signs:   The focus of this group is to help patients identify signs or symptoms they exhibit before slipping into an unhealthy state or crisis.  Participation Level:  Did Not Attend  Additional Comments:  Pt was encouraged to attend group but pt stayed in bed and slept.   Cathlean CowerClouse, Siriah Treat Y 01/14/2014, 6:26 PM

## 2014-01-14 NOTE — Progress Notes (Signed)
D: Pt denies SI/HI/AVH. Pt is pleasant and cooperative. Pt just waiting to go to recovery house. Pt stayed to room most of the night stating he was sleepy.   A: Pt was offered support and encouragement. Pt was given scheduled medications. Pt was encourage to attend groups. Q 15 minute checks were done for safety.   R: Pt is taking medication. Pt has no complaints.Pt receptive to treatment and safety maintained on unit.

## 2014-01-14 NOTE — BHH Suicide Risk Assessment (Signed)
B[facility] INPATIENT:  Family/Significant Other Suicide Prevention Education  Suicide Prevention Education:  Patient Refusal for Family/Significant Other Suicide Prevention Education: The patient Chad Sanchez has refused to provide written consent for family/significant other to be provided Family/Significant Other Suicide Prevention Education during admission and/or prior to discharge.  Physician notified.  Pt refused family contact. SPE completed with pt. SPI pamphlet provided to pt and he was encouraged to share information with support network, ask questions, and talk about any concerns relating to SPE.   Smart, Nevyn Bossman LCSWA  01/14/2014, 3:16 PM

## 2014-01-14 NOTE — BHH Counselor (Signed)
Adult Psychosocial Assessment Update Interdisciplinary Team  Previous Behavior Health Hospital admissions/discharges:  Admissions Discharges  Date: 01/14/14 Date: unknown at this time   Date: 09/10/13 Date: 09/13/13  Date: 08/26/13 Date: 08/27/13  Date: 08/17/13 Date: 08/20/13  Date: 06/04/13 Date: 06/07/13   Changes since the last Psychosocial Assessment (including adherence to outpatient mental health and/or substance abuse treatment, situational issues contributing to decompensation and/or relapse). Chad Sanchez is a 43 y.o. male who presents to the Emergency Department needing medical clearance for detox. He states he wants to stop drinking alcohol. He reports using cocaine yesterday, but states that is not his primary issue. He reports drinking approximately one case of beer daily. He states he has been drinking for over twenty years. He reports a severe HA, numbness of his hands, and paresthesias of BLE for the past week. He reports a fever for the past week ranging from 99.5 degrees to 100 degrees. He reports trembles. Pt states is normally able to eat nightly after he drinks but states he does not normally eat in the morning time. He denies abdominal pain, vomiting, diarrhea, dysuria, urinary frequency, or hematuria. He denies h/o DM. He denies IV drug use. He denies suicidal or homicidal ideations.             Discharge Plan 1. Will you be returning to the same living situation after discharge?   Yes: No:      If no, what is your plan?    Pt plans to go to [facility] [place] but must be detoxed in hospital first. Pt [facility] [employer] [facility].        2. Would you like a referral for services when you are discharged? Yes:     If yes, for what services?  No:       Pt [facility].        Summary and Recommendations (to be completed by the evaluator) Pt is 43 year old male  [place], [place] [facility] [facility], cocaine abuse, mood stabilization, medication management, and passive SI. Pt reports that [facility] and plans to [facility] upon d/c. Pt currently denies SI/HI/AVH and rates depression/anxiety as "low." Pt plans to [facility]. Recommendations for pt include: crisis stabilization, therapeutic milieu, encourage group attendance and participation, librium/clonidine taper for withdrawals, medication management for mood stabilization, and development of comprehensive mental wellness/sobriety plan.                        Signature:  Micah NoelSmart, Vishruth Seoane LCSWA, 01/14/2014 12:54 PM

## 2014-01-14 NOTE — BHH Group Notes (Signed)
BHH LCSW Group Therapy  01/14/2014 2:59 PM  Type of Therapy:  Group Therapy  Participation Level:  Active  Participation Quality:  Attentive  Affect:  Appropriate  Cognitive:  Alert and Oriented  Insight:  Engaged  Engagement in Therapy:  Engaged  Modes of Intervention:  Confrontation, Discussion, Education, Exploration, Problem-solving, Rapport Building, Socialization and Support  Summary of Progress/Problems: Feelings around Relapse. Group members discussed the meaning of relapse and shared personal stories of relapse, how it affected them and others, and how they perceived themselves during this time. Group members were encouraged to identify triggers, warning signs and coping skills used when facing the possibility of relapse. Social supports were discussed and explored in detail. Post Acute Withdrawal Syndrome (handout provided) was introduced and examined. Pt's were encouraged to ask questions, talk about key points associated with PAWS, and process this information in terms of relapse prevention. Chad Sanchez was attentive and engaged throughout today's therapy session. Chad Sanchez shared his personal experiences with PAWS and his most recent relapse. Chad Sanchez shows progress in the group setting and improving insight AEB his ability to process how going to a supportive recovery based program [name] and allowing his family/social supports to be a part of his recovery will help him cope with PAWS and learn coping skills that are positive and healthy.    Smart, Chad Sanchez LCSWA  01/14/2014, 2:59 PM

## 2014-01-14 NOTE — Clinical Social Work Note (Signed)
[name] and pt [facility]left message requesting that they hold on to pt belongings until he is able to pick them up at d/c next week. [name] and pt also called [facility] together. They will not be able to bring [facility], but will allow his family to pick up any items not allowed (cell phone etc) at the facility. Beverly at [facility] asked that [name] call either her or Loraine LericheMark on d/c date to arrange transportation [phone] or [phone]).  The Sherwin-WilliamsHeather Smart, LCSWA 01/14/2014 3:18 PM

## 2014-01-14 NOTE — Tx Team (Signed)
Initial Interdisciplinary Treatment Plan  PATIENT STRENGTHS: (choose at least two) General fund of knowledge  PATIENT STRESSORS: Substance abuse   PROBLEM LIST: Problem List/Patient Goals Date to be addressed Date deferred Reason deferred Estimated date of resolution  SA 01/14/14     Risk for Suicuide 01/14/14                                                DISCHARGE CRITERIA:  Safe-care adequate arrangements made Withdrawal symptoms are absent or subacute and managed without 24-hour nursing intervention  PRELIMINARY DISCHARGE PLAN: Attend aftercare/continuing care group Attend PHP/IOP Outpatient therapy  PATIENT/FAMIILY INVOLVEMENT: This treatment plan has been presented to and reviewed with the patient, Chad Sanchez.  The patient and family have been given the opportunity to ask questions and make suggestions.  Jacques Navyhillips, Tabrina Esty A 01/14/2014, 5:40 AM

## 2014-01-14 NOTE — ED Provider Notes (Signed)
Medical screening examination/treatment/procedure(s) were performed by non-physician practitioner and as supervising physician I was immediately available for consultation/collaboration.   EKG Interpretation None      Austen Wygant, MD, FACEP   Shondale Quinley L Elleana Stillson, MD 01/14/14 0028 

## 2014-01-14 NOTE — Tx Team (Signed)
Interdisciplinary Treatment Plan Update (Adult)  Date: 01/14/2014   Time Reviewed: 12:08 PM  Progress in Treatment:  Attending groups: No.  Participating in groups: no.    Taking medication as prescribed: Yes  Tolerating medication: Yes  Family/Significant othe contact made: Not yet. SPE required for this pt.  Patient understands diagnosis: Yes, AEB seeking treatment for ETOH detox, cocaine abuse, mood stabilization, and passive SI.  Discussing patient identified problems/goals with staff: Yes  Medical problems stabilized or resolved: Yes  Denies suicidal/homicidal ideation: Yes during self report.  Patient has not harmed self or Others: Yes  New problem(s) identified:  Discharge Plan or Barriers: Pt plans to enter [facility] after detox complete. He will likely follow up at [facility] for med management. [name] assessing.  Additional comments: [age] yr male who presents [facility] in no acute distress for the treatment of Detox SI and Depression. Pt appears flat and depressed. Pt was calm and cooperative with admission process. Pt presents with passive SI and contracts for safety upon admission. Pt denies AVH . Pt was brought here by [facility] workers. To get clean before checking into two year program. Pt stated he's "just tired." Reason for Continuation of Hospitalization:  Librium taper-withdrawals Mood stabilization Medication Management  Estimated length of stay: 3-4 days  For review of initial/current patient goals, please see plan of care.  Attendees:  Patient:    Family:    Physician: Geoffery LyonsIrving Lugo MD 01/14/2014 12:07 PM   Nursing: Brayton ElBritney RN 01/14/2014 12:07 PM   Clinical Social Worker Tilman Mcclaren Smart, LCSWA  01/14/2014 12:07 PM   Other: Griffin Dakinonecia Rn  01/14/2014 12:07 PM   Other: Darden DatesJennifer C. Nurse CM 01/14/2014 12:07 PM   Other: Massie Kluverelores Sutton, Community Care Coordinator  01/14/2014 12:07 PM   Other: Chandra BatchAggie N. PA 01/14/2014 12:07 PM   Scribe for Treatment Team:  Herbert SetaHeather Smart  LCSWA 01/14/2014 12:08 PM

## 2014-01-14 NOTE — H&P (Signed)
Psychiatric Admission Assessment Adult  Patient Identification:  Chad Sanchez Date of Evaluation:  01/14/2014 Chief Complaint:  ALCOHOL DEPENDENCE History of Present Illness:: 43 Y/O male who states that he is trying to get into the [facility]. He was asked to come here for detox. States when he left here last time (Nov 2014)he went to his home town as he was planning to. While he was there he drank every day. States he would take sips not to have the shakes. He decided to pursue the [address] program.  He admits to cocaine every now and then. He had an interview with  [facility] and  he told them he had "drunk a little something." that day so he was sent here for "detox" and clearance. A week or so ago he had experienced fever malaise came to the ED. He was told it had to do with his withdrawal.   Associated Signs/Synptoms: Depression Symptoms:  denies (Hypo) Manic Symptoms:  denies Anxiety Symptoms:  Anxiety, worry,  Psychotic Symptoms:  denies PTSD Symptoms: Negative Total Time spent with patient: 45 minutes  Psychiatric Specialty Exam: Physical Exam  Review of Systems  Constitutional: Negative.   HENT: Negative.   Eyes: Negative.   Respiratory:       Half a pack a day  Cardiovascular: Negative.   Gastrointestinal: Negative.   Genitourinary: Negative.   Musculoskeletal: Negative.   Skin: Negative.   Neurological: Negative.        Numbness both legs   Endo/Heme/Allergies: Negative.   Psychiatric/Behavioral: Positive for substance abuse. The patient is nervous/anxious and has insomnia.     Blood pressure 112/79, pulse 105, temperature 98.3 F (36.8 C), temperature source Oral, resp. rate 16, height 6' 1"  (1.854 m), weight 69.854 kg (154 lb).Body mass index is 20.32 kg/(m^2).  General Appearance: Fairly Groomed  Engineer, water::  Fair  Speech:  Clear and Coherent, Slow and not spontaneous  Volume:  Decreased  Mood:  Anxious and worry  Affect:  anxiety, worry   Thought Process:  Coherent and Goal Directed  Orientation:  Full (Time, Place, and Person)  Thought Content:  worries, concerns  Suicidal Thoughts:  No  Homicidal Thoughts:  No  Memory:  Immediate;   Fair Recent;   Fair Remote;   Fair  Judgement:  Fair  Insight:  Present  Psychomotor Activity:  Restlessness  Concentration:  Fair  Recall:  [employer] of Knowledge:NA  Language: Fair  Akathisia:  No  Handed:    AIMS (if indicated):     Assets:  Desire for Improvement  Sleep:  Number of Hours: 3    Musculoskeletal: Strength & Muscle Tone: within normal limits Gait & Station: normal Patient leans: N/A  Past Psychiatric History: Diagnosis:  Hospitalizations: [facility]   Outpatient Care: [facility]  Substance Abuse Care: [facility],  Self-Mutilation: Denies  Suicidal Attempts:Denies  Violent Behaviors:Denies   Past Medical History:   Past Medical History  Diagnosis Date  . Alcohol abuse   . Depression   . Anxiety     Allergies:   Allergies  Allergen Reactions  . Shellfish Allergy Anaphylaxis   PTA Medications: No prescriptions prior to admission    Previous Psychotropic Medications:  Medication/Dose  Prozac               Substance Abuse History in the last 12 months:  yes  Consequences of Substance Abuse: Withdrawal Symptoms:   Tremors  Social History:  reports that he has been smoking Cigarettes.  He has been smoking about 1.00 pack per day. He has never used smokeless tobacco. He reports that he drinks about 0.6 ounces of alcohol per week. He reports that he uses illicit drugs ("Crack" cocaine and Cocaine). 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 Legal History: Hobbies/Interests:  Family History:  History reviewed. No pertinent family history.                             Denies family history of mental illness  Results for orders placed during the hospital encounter of 01/13/14 (from the past 72 hour(s))  URINE RAPID DRUG SCREEN (HOSP PERFORMED)     Status: Abnormal   Collection Time    01/13/14  9:29 PM      Result Value Ref Range   Opiates NONE DETECTED  NONE DETECTED   Cocaine POSITIVE (*) NONE DETECTED   Benzodiazepines NONE DETECTED  NONE DETECTED   Amphetamines NONE DETECTED  NONE DETECTED   Tetrahydrocannabinol NONE DETECTED  NONE DETECTED   Barbiturates NONE DETECTED  NONE DETECTED   Comment:            DRUG SCREEN FOR MEDICAL PURPOSES     ONLY.  IF CONFIRMATION IS NEEDED     FOR ANY PURPOSE, NOTIFY LAB     WITHIN 5 DAYS.                LOWEST DETECTABLE LIMITS     FOR URINE DRUG SCREEN     Drug Class       Cutoff (ng/mL)     Amphetamine      1000     Barbiturate      200     Benzodiazepine   220     Tricyclics       254     Opiates          300     Cocaine          300     THC              50  URINALYSIS, ROUTINE W REFLEX MICROSCOPIC     Status: None   Collection Time    01/13/14  9:29 PM      Result Value Ref Range   Color, Urine YELLOW  YELLOW   APPearance CLEAR  CLEAR   Specific Gravity, Urine 1.013  1.005 - 1.030   pH 5.0  5.0 - 8.0   Glucose, UA NEGATIVE  NEGATIVE mg/dL   Hgb urine dipstick NEGATIVE  NEGATIVE   Bilirubin Urine NEGATIVE  NEGATIVE   Ketones, ur NEGATIVE  NEGATIVE mg/dL   Protein, ur NEGATIVE  NEGATIVE mg/dL   Urobilinogen, UA 0.2  0.0 - 1.0 mg/dL   Nitrite NEGATIVE  NEGATIVE   Leukocytes, UA NEGATIVE  NEGATIVE   Comment: MICROSCOPIC NOT DONE ON URINES WITH NEGATIVE PROTEIN, BLOOD, LEUKOCYTES, NITRITE, OR GLUCOSE <1000 mg/dL.  ACETAMINOPHEN LEVEL     Status: None   Collection Time    01/13/14  9:35  PM  Result Value Ref Range   Acetaminophen (Tylenol), Serum <15.0  10 - 30 ug/mL   Comment:            THERAPEUTIC CONCENTRATIONS VARY     SIGNIFICANTLY. A RANGE OF 10-30     ug/mL MAY BE AN EFFECTIVE     CONCENTRATION FOR MANY PATIENTS.     HOWEVER, SOME ARE BEST TREATED     AT CONCENTRATIONS OUTSIDE THIS     RANGE.     ACETAMINOPHEN CONCENTRATIONS     >150 ug/mL AT 4 HOURS AFTER     INGESTION AND >50 ug/mL AT 12     HOURS AFTER INGESTION ARE     OFTEN ASSOCIATED WITH TOXIC     REACTIONS.  CBC     Status: Abnormal   Collection Time    01/13/14  9:35 PM      Result Value Ref Range   WBC 6.5  4.0 - 10.5 K/uL   RBC 4.72  4.22 - 5.81 MIL/uL   Hemoglobin 12.9 (*) 13.0 - 17.0 g/dL   HCT 38.1 (*) 39.0 - 52.0 %   MCV 80.7  78.0 - 100.0 fL   MCH 27.3  26.0 - 34.0 pg   MCHC 33.9  30.0 - 36.0 g/dL   RDW 15.5  11.5 - 15.5 %   Platelets 453 (*) 150 - 400 K/uL  COMPREHENSIVE METABOLIC PANEL     Status: Abnormal   Collection Time    01/13/14  9:35 PM      Result Value Ref Range   Sodium 138  137 - 147 mEq/L   Potassium 3.9  3.7 - 5.3 mEq/L   Chloride 98  96 - 112 mEq/L   CO2 23  19 - 32 mEq/L   Glucose, Bld 121 (*) 70 - 99 mg/dL   BUN 8  6 - 23 mg/dL   Creatinine, Ser 0.90  0.50 - 1.35 mg/dL   Calcium 9.3  8.4 - 10.5 mg/dL   Total Protein 8.0  6.0 - 8.3 g/dL   Albumin 3.8  3.5 - 5.2 g/dL   AST 34  0 - 37 U/L   ALT 26  0 - 53 U/L   Alkaline Phosphatase 76  39 - 117 U/L   Total Bilirubin 0.3  0.3 - 1.2 mg/dL   GFR calc non Af Amer >90  >90 mL/min   GFR calc Af Amer >90  >90 mL/min   Comment: (NOTE)     The eGFR has been calculated using the CKD EPI equation.     This calculation has not been validated in all clinical situations.     eGFR's persistently <90 mL/min signify possible Chronic Kidney     Disease.  ETHANOL     Status: Abnormal   Collection Time    01/13/14  9:35 PM      Result Value Ref Range   Alcohol, Ethyl (B) 194 (*) 0 - 11 mg/dL   Comment:             LOWEST DETECTABLE LIMIT FOR     SERUM ALCOHOL IS 11 mg/dL     FOR MEDICAL PURPOSES ONLY  SALICYLATE LEVEL     Status: Abnormal   Collection Time    01/13/14  9:35 PM      Result Value Ref Range   Salicylate Lvl <1.9 (*) 2.8 - 20.0 mg/dL  LIPASE, BLOOD     Status: Abnormal   Collection Time    01/13/14  9:35  PM      Result Value Ref Range   Lipase 66 (*) 11 - 59 U/L   Psychological Evaluations:  Assessment:   DSM5:  Schizophrenia Disorders:  none Obsessive-Compulsive Disorders:  none Trauma-Stressor Disorders:  none Substance/Addictive Disorders:  Alcohol Related Disorder - Severe (303.90), Cocaine related disorder moderate Depressive Disorders:  Major Depressive Disorder - Mild (296.21)  AXIS I:  Generalized Anxiety Disorder and Substance Induced Mood Disorder AXIS II:  Deferred AXIS III:   Past Medical History  Diagnosis Date  . Alcohol abuse   . Depression   . Anxiety    AXIS IV:  economic problems, housing problems and occupational problems AXIS V:  41-50 serious symptoms  Treatment Plan/Recommendations:  Supportive approach/coping skills/relapse prevention                                                                 Reassess and address the comorbidities  Treatment Plan Summary: Daily contact with patient to assess and evaluate symptoms and progress in treatment Medication management Current Medications:  Current Facility-Administered Medications  Medication Dose Route Frequency Provider Last Rate Last Dose  . acetaminophen (TYLENOL) tablet 650 mg  650 mg Oral Q6H PRN Lurena Nida, NP      . alum & mag hydroxide-simeth (MAALOX/MYLANTA) 200-200-20 MG/5ML suspension 30 mL  30 mL Oral Q4H PRN Lurena Nida, NP      . chlordiazePOXIDE (LIBRIUM) capsule 25 mg  25 mg Oral Q6H PRN Lurena Nida, NP      . chlordiazePOXIDE (LIBRIUM) capsule 25 mg  25 mg Oral QID Lurena Nida, NP   25 mg at 01/14/14 1133   Followed by  . [START ON 01/15/2014] chlordiazePOXIDE  (LIBRIUM) capsule 25 mg  25 mg Oral TID Lurena Nida, NP       Followed by  . [START ON 01/16/2014] chlordiazePOXIDE (LIBRIUM) capsule 25 mg  25 mg Oral BH-qamhs Lurena Nida, NP       Followed by  . [START ON 01/18/2014] chlordiazePOXIDE (LIBRIUM) capsule 25 mg  25 mg Oral Daily Lurena Nida, NP      . cloNIDine (CATAPRES) tablet 0.1 mg  0.1 mg Oral QID Lurena Nida, NP   0.1 mg at 01/14/14 1133   Followed by  . [START ON 01/16/2014] cloNIDine (CATAPRES) tablet 0.1 mg  0.1 mg Oral BH-qamhs Lurena Nida, NP       Followed by  . [START ON 01/19/2014] cloNIDine (CATAPRES) tablet 0.1 mg  0.1 mg Oral QAC breakfast Lurena Nida, NP      . dicyclomine (BENTYL) tablet 20 mg  20 mg Oral Q6H PRN Lurena Nida, NP      . hydrOXYzine (ATARAX/VISTARIL) tablet 25 mg  25 mg Oral Q6H PRN Lurena Nida, NP      . loperamide (IMODIUM) capsule 2-4 mg  2-4 mg Oral PRN Lurena Nida, NP      . magnesium hydroxide (MILK OF MAGNESIA) suspension 30 mL  30 mL Oral Daily PRN Lurena Nida, NP      . methocarbamol (ROBAXIN) tablet 500 mg  500 mg Oral Q8H PRN Lurena Nida, NP      . multivitamin with minerals tablet 1 tablet  1  tablet Oral Daily Lurena Nida, NP   1 tablet at 01/14/14 2951  . naproxen (NAPROSYN) tablet 500 mg  500 mg Oral BID PRN Lurena Nida, NP      . ondansetron (ZOFRAN-ODT) disintegrating tablet 4 mg  4 mg Oral Q6H PRN Lurena Nida, NP      . traZODone (DESYREL) tablet 50 mg  50 mg Oral QHS PRN Lurena Nida, NP        Observation Level/Precautions:  15 minute checks  Laboratory:  As per the ED  Psychotherapy:  Individual/group  Medications:  Librium detox/neurontin  Consultations:    Discharge Concerns:    Estimated LOS: 3-5 days  Other:     I certify that inpatient services furnished can reasonably be expected to improve the patient's condition.   Symantha Steeber A 3/13/20151:09 PM

## 2014-01-14 NOTE — Progress Notes (Signed)
D: Patient denies SI/HI and A/V hallucinations; patient denying all withdrawal symptoms except anxiety;   A: Monitored q 15 minutes; patient encouraged to attend groups; patient educated about medications; patient given medications per physician orders; patient encouraged to express feelings and/or concerns  R: Patient is calm and cooperative; patient is appropriate to circumstances; patient's interaction with staff and peers is appropriate; patient is taking medications as prescribed and tolerating medications; patient is attending some groups

## 2014-01-15 DIAGNOSIS — F411 Generalized anxiety disorder: Secondary | ICD-10-CM

## 2014-01-15 DIAGNOSIS — F1994 Other psychoactive substance use, unspecified with psychoactive substance-induced mood disorder: Secondary | ICD-10-CM

## 2014-01-15 MED ORDER — CHLORDIAZEPOXIDE HCL 25 MG PO CAPS: 25.0000 mg | ORAL_CAPSULE | Freq: Every day | ORAL | Status: DC

## 2014-01-15 MED ORDER — CHLORDIAZEPOXIDE HCL 25 MG PO CAPS: 50.0000 mg | ORAL_CAPSULE | Freq: Once | ORAL | Status: DC

## 2014-01-15 MED ORDER — ALUM & MAG HYDROXIDE-SIMETH 200-200-20 MG/5ML PO SUSP: 30.0000 mL | ORAL | Status: DC | PRN

## 2014-01-15 MED ORDER — THIAMINE HCL 100 MG/ML IJ SOLN
100.0000 mg | Freq: Once | INTRAMUSCULAR | Status: DC
Start: 2014-01-15 — End: 2014-01-15

## 2014-01-15 MED ORDER — CHLORDIAZEPOXIDE HCL 25 MG PO CAPS: 25.0000 mg | ORAL_CAPSULE | Freq: Four times a day (QID) | ORAL | Status: DC

## 2014-01-15 MED ORDER — CHLORDIAZEPOXIDE HCL 25 MG PO CAPS: 25.0000 mg | ORAL_CAPSULE | Freq: Three times a day (TID) | ORAL | Status: DC

## 2014-01-15 MED ORDER — ACETAMINOPHEN 325 MG PO TABS: 650.0000 mg | ORAL_TABLET | Freq: Four times a day (QID) | ORAL | Status: DC | PRN

## 2014-01-15 MED ORDER — HYDROXYZINE HCL 25 MG PO TABS: 25.0000 mg | ORAL_TABLET | Freq: Four times a day (QID) | ORAL | Status: DC | PRN

## 2014-01-15 MED ORDER — VITAMIN B-1 100 MG PO TABS: 100.0000 mg | ORAL_TABLET | Freq: Every day | ORAL | Status: DC

## 2014-01-15 MED ORDER — CHLORDIAZEPOXIDE HCL 25 MG PO CAPS: 25.0000 mg | ORAL_CAPSULE | Freq: Four times a day (QID) | ORAL | Status: DC | PRN

## 2014-01-15 MED ORDER — ONDANSETRON 4 MG PO TBDP: 4.0000 mg | ORAL_TABLET | Freq: Four times a day (QID) | ORAL | Status: DC | PRN

## 2014-01-15 MED ORDER — THIAMINE HCL 100 MG/ML IJ SOLN: 100.0000 mg | Freq: Every day | INTRAMUSCULAR | Status: DC

## 2014-01-15 MED ORDER — ADULT MULTIVITAMIN W/MINERALS CH: 1.0000 | ORAL_TABLET | Freq: Every day | ORAL | Status: DC

## 2014-01-15 MED ORDER — CHLORDIAZEPOXIDE HCL 25 MG PO CAPS: 25.0000 mg | ORAL_CAPSULE | ORAL | Status: DC

## 2014-01-15 MED ORDER — LOPERAMIDE HCL 2 MG PO CAPS: 2.0000 mg | ORAL_CAPSULE | ORAL | Status: DC | PRN

## 2014-01-15 MED ORDER — MAGNESIUM HYDROXIDE 400 MG/5ML PO SUSP: 30.0000 mL | Freq: Every day | ORAL | Status: DC | PRN

## 2014-01-15 MED ORDER — TRAZODONE HCL 50 MG PO TABS: 50.0000 mg | ORAL_TABLET | Freq: Every evening | ORAL | Status: DC | PRN

## 2014-01-15 MED ORDER — NICOTINE 21 MG/24HR TD PT24
21.0000 mg | MEDICATED_PATCH | Freq: Every day | TRANSDERMAL | Status: DC
  Filled 2014-01-15 (×4): qty 1

## 2014-01-15 NOTE — Progress Notes (Signed)
[facility] MD Progress Note  01/15/2014 2:48 PM Chad Sanchez  MRN:  [id number] Subjective:   Patient states "I have been abusing alcohol for the last twenty five years. I drank some beer before I came in so the substance abuse program would not accept me. They said I need to come here for detox. I really need to go to this two year program for help."   Objective:  Patient is visible in the dayroom watching TV with peers. He is guarded during interactions with Probation officer. Patient appears resentful over his admission here feeling that he did not need complete detox. He denies other drug use but his urine drug screen was positive for cocaine. Nursing staff report that the patient is not attending any groups. Patient does not appear invested in his treatment. He took his medications yesterday but has been refusing them today. Boss is fixated on not needing to be in the hospital. However, he presented as a walk in to [facility] requesting alcohol and cocaine detox. At that time patient reported that he had been drinking daily since previous detox in 2/15. Braylon tells this Probation officer today that he had just taken a few sips. Patient does not appear consistent in providing his history.   Diagnosis:   DSM5: Total Time spent with patient: 20 minutes  AXIS I: Generalized Anxiety Disorder and Substance Induced Mood Disorder  AXIS II: Deferred  AXIS III:  Past Medical History   Diagnosis  Date   .  Alcohol abuse    .  Depression    .  Anxiety     AXIS IV: economic problems, housing problems and occupational problems  AXIS V: 41-50 serious symptoms  ADL's:  Intact  Sleep: Good  Appetite:  Good  Suicidal Ideation:  Denies Homicidal Ideation:  Denies AEB (as evidenced by):  Psychiatric Specialty Exam: Physical Exam  Review of Systems  Constitutional: Negative.   HENT: Negative.   Eyes: Negative.   Respiratory: Negative.   Cardiovascular: Negative.   Gastrointestinal: Negative.   Genitourinary:  Negative.   Musculoskeletal: Negative.   Skin: Negative.   Neurological: Negative.   Endo/Heme/Allergies: Negative.   Psychiatric/Behavioral: Positive for substance abuse. Negative for depression, suicidal ideas, hallucinations and memory loss. The patient is nervous/anxious. The patient does not have insomnia.     Blood pressure 104/73, pulse 77, temperature 97.4 F (36.3 C), temperature source Oral, resp. rate 18, height _0  (1.854 m), weight 69.854 kg (154 lb).Body mass index is 20.32 kg/(m^2).  General Appearance: Fairly Groomed  Engineer, water::  Fair  Speech:  Clear and Coherent and Slow  Volume:  Decreased  Mood:  Anxious  Affect:  Full Range  Thought Process:  Coherent and Goal Directed  Orientation:  Full (Time, Place, and Person)  Thought Content:  worries, concerns  Suicidal Thoughts:  No  Homicidal Thoughts:  No  Memory:  Immediate;   Fair Recent;   Fair Remote;   Fair  Judgement:  Fair  Insight:  Present  Psychomotor Activity:  Restlessness  Concentration:  Fair  Recall:  [employer] of Knowledge:NA  Language: Fair  Akathisia:  No  Handed:  Right  AIMS (if indicated):     Assets:  Communication Skills Desire for Improvement Physical Health Resilience  Sleep:  Number of Hours: 6.75   Musculoskeletal: Strength & Muscle Tone: within normal limits Gait & Station: normal Patient leans: N/A  Current Medications: Current Facility-Administered Medications  Medication Dose Route Frequency Provider Last Rate Last Dose  .  acetaminophen (TYLENOL) tablet 650 mg  650 mg Oral Q6H PRN Lurena Nida, NP      . alum & mag hydroxide-simeth (MAALOX/MYLANTA) 200-200-20 MG/5ML suspension 30 mL  30 mL Oral Q4H PRN Lurena Nida, NP      . chlordiazePOXIDE (LIBRIUM) capsule 25 mg  25 mg Oral Q6H PRN Lurena Nida, NP      . chlordiazePOXIDE (LIBRIUM) capsule 25 mg  25 mg Oral QID Lurena Nida, NP   25 mg at 01/14/14 2233   Followed by  . chlordiazePOXIDE (LIBRIUM) capsule 25  mg  25 mg Oral TID Lurena Nida, NP       Followed by  . [START ON 01/16/2014] chlordiazePOXIDE (LIBRIUM) capsule 25 mg  25 mg Oral BH-qamhs Lurena Nida, NP       Followed by  . [START ON 01/18/2014] chlordiazePOXIDE (LIBRIUM) capsule 25 mg  25 mg Oral Daily Lurena Nida, NP      . cloNIDine (CATAPRES) tablet 0.1 mg  0.1 mg Oral QID Lurena Nida, NP   0.1 mg at 01/14/14 2233   Followed by  . [START ON 01/16/2014] cloNIDine (CATAPRES) tablet 0.1 mg  0.1 mg Oral BH-qamhs Lurena Nida, NP       Followed by  . [START ON 01/19/2014] cloNIDine (CATAPRES) tablet 0.1 mg  0.1 mg Oral QAC breakfast Lurena Nida, NP      . dicyclomine (BENTYL) tablet 20 mg  20 mg Oral Q6H PRN Lurena Nida, NP      . gabapentin (NEURONTIN) capsule 100 mg  100 mg Oral TID Nicholaus Bloom, MD   100 mg at 01/14/14 1717  . hydrOXYzine (ATARAX/VISTARIL) tablet 25 mg  25 mg Oral Q6H PRN Lurena Nida, NP      . loperamide (IMODIUM) capsule 2-4 mg  2-4 mg Oral PRN Lurena Nida, NP      . magnesium hydroxide (MILK OF MAGNESIA) suspension 30 mL  30 mL Oral Daily PRN Lurena Nida, NP      . methocarbamol (ROBAXIN) tablet 500 mg  500 mg Oral Q8H PRN Lurena Nida, NP      . multivitamin with minerals tablet 1 tablet  1 tablet Oral Daily Lurena Nida, NP   1 tablet at 01/14/14 2992  . naproxen (NAPROSYN) tablet 500 mg  500 mg Oral BID PRN Lurena Nida, NP      . nicotine (NICODERM CQ - dosed in mg/24 hours) patch 21 mg  21 mg Transdermal Daily Lurena Nida, NP      . ondansetron (ZOFRAN-ODT) disintegrating tablet 4 mg  4 mg Oral Q6H PRN Lurena Nida, NP      . traZODone (DESYREL) tablet 50 mg  50 mg Oral QHS PRN Lurena Nida, NP        Lab Results:  Results for orders placed during the hospital encounter of 01/13/14 (from the past 48 hour(s))  URINE RAPID DRUG SCREEN (HOSP PERFORMED)     Status: Abnormal   Collection Time    01/13/14  9:29 PM      Result Value Ref Range   Opiates NONE DETECTED  NONE DETECTED   Cocaine  POSITIVE (*) NONE DETECTED   Benzodiazepines NONE DETECTED  NONE DETECTED   Amphetamines NONE DETECTED  NONE DETECTED   Tetrahydrocannabinol NONE DETECTED  NONE DETECTED   Barbiturates NONE DETECTED  NONE DETECTED   Comment:  DRUG SCREEN FOR MEDICAL PURPOSES     ONLY.  IF CONFIRMATION IS NEEDED     FOR ANY PURPOSE, NOTIFY LAB     WITHIN 5 DAYS.                LOWEST DETECTABLE LIMITS     FOR URINE DRUG SCREEN     Drug Class       Cutoff (ng/mL)     Amphetamine      1000     Barbiturate      200     Benzodiazepine   820     Tricyclics       601     Opiates          300     Cocaine          300     THC              50  URINALYSIS, ROUTINE W REFLEX MICROSCOPIC     Status: None   Collection Time    01/13/14  9:29 PM      Result Value Ref Range   Color, Urine YELLOW  YELLOW   APPearance CLEAR  CLEAR   Specific Gravity, Urine 1.013  1.005 - 1.030   pH 5.0  5.0 - 8.0   Glucose, UA NEGATIVE  NEGATIVE mg/dL   Hgb urine dipstick NEGATIVE  NEGATIVE   Bilirubin Urine NEGATIVE  NEGATIVE   Ketones, ur NEGATIVE  NEGATIVE mg/dL   Protein, ur NEGATIVE  NEGATIVE mg/dL   Urobilinogen, UA 0.2  0.0 - 1.0 mg/dL   Nitrite NEGATIVE  NEGATIVE   Leukocytes, UA NEGATIVE  NEGATIVE   Comment: MICROSCOPIC NOT DONE ON URINES WITH NEGATIVE PROTEIN, BLOOD, LEUKOCYTES, NITRITE, OR GLUCOSE <1000 mg/dL.  ACETAMINOPHEN LEVEL     Status: None   Collection Time    01/13/14  9:35 PM      Result Value Ref Range   Acetaminophen (Tylenol), Serum <15.0  10 - 30 ug/mL   Comment:            THERAPEUTIC CONCENTRATIONS VARY     SIGNIFICANTLY. A RANGE OF 10-30     ug/mL MAY BE AN EFFECTIVE     CONCENTRATION FOR MANY PATIENTS.     HOWEVER, SOME ARE BEST TREATED     AT CONCENTRATIONS OUTSIDE THIS     RANGE.     ACETAMINOPHEN CONCENTRATIONS     >150 ug/mL AT 4 HOURS AFTER     INGESTION AND >50 ug/mL AT 12     HOURS AFTER INGESTION ARE     OFTEN ASSOCIATED WITH TOXIC     REACTIONS.  CBC     Status:  Abnormal   Collection Time    01/13/14  9:35 PM      Result Value Ref Range   WBC 6.5  4.0 - 10.5 K/uL   RBC 4.72  4.22 - 5.81 MIL/uL   Hemoglobin 12.9 (*) 13.0 - 17.0 g/dL   HCT 38.1 (*) 39.0 - 52.0 %   MCV 80.7  78.0 - 100.0 fL   MCH 27.3  26.0 - 34.0 pg   MCHC 33.9  30.0 - 36.0 g/dL   RDW 15.5  11.5 - 15.5 %   Platelets 453 (*) 150 - 400 K/uL  COMPREHENSIVE METABOLIC PANEL     Status: Abnormal   Collection Time    01/13/14  9:35 PM      Result Value Ref Range   Sodium  138  137 - 147 mEq/L   Potassium 3.9  3.7 - 5.3 mEq/L   Chloride 98  96 - 112 mEq/L   CO2 23  19 - 32 mEq/L   Glucose, Bld 121 (*) 70 - 99 mg/dL   BUN 8  6 - 23 mg/dL   Creatinine, Ser 0.90  0.50 - 1.35 mg/dL   Calcium 9.3  8.4 - 10.5 mg/dL   Total Protein 8.0  6.0 - 8.3 g/dL   Albumin 3.8  3.5 - 5.2 g/dL   AST 34  0 - 37 U/L   ALT 26  0 - 53 U/L   Alkaline Phosphatase 76  39 - 117 U/L   Total Bilirubin 0.3  0.3 - 1.2 mg/dL   GFR calc non Af Amer >90  >90 mL/min   GFR calc Af Amer >90  >90 mL/min   Comment: (NOTE)     The eGFR has been calculated using the CKD EPI equation.     This calculation has not been validated in all clinical situations.     eGFR's persistently <90 mL/min signify possible Chronic Kidney     Disease.  ETHANOL     Status: Abnormal   Collection Time    01/13/14  9:35 PM      Result Value Ref Range   Alcohol, Ethyl (B) 194 (*) 0 - 11 mg/dL   Comment:            LOWEST DETECTABLE LIMIT FOR     SERUM ALCOHOL IS 11 mg/dL     FOR MEDICAL PURPOSES ONLY  SALICYLATE LEVEL     Status: Abnormal   Collection Time    01/13/14  9:35 PM      Result Value Ref Range   Salicylate Lvl <2.9 (*) 2.8 - 20.0 mg/dL  LIPASE, BLOOD     Status: Abnormal   Collection Time    01/13/14  9:35 PM      Result Value Ref Range   Lipase 66 (*) 11 - 59 U/L    Physical Findings: AIMS: Facial and Oral Movements Muscles of Facial Expression: None, normal Lips and Perioral Area: None, normal Jaw: None,  normal Tongue: None, normal,Extremity Movements Upper (arms, wrists, hands, fingers): None, normal Lower (legs, knees, ankles, toes): None, normal, Trunk Movements Neck, shoulders, hips: None, normal, Overall Severity Severity of abnormal movements (highest score from questions above): None, normal Incapacitation due to abnormal movements: None, normal Patient's awareness of abnormal movements (rate only patient's report): No Awareness, Dental Status Current problems with teeth and/or dentures?: No Does patient usually wear dentures?: No  CIWA:  CIWA-Ar Total: 0 COWS:  COWS Total Score: 3  Treatment Plan Summary: Daily contact with patient to assess and evaluate symptoms and progress in treatment Medication management  Plan: Continue crisis management and stabilization.  Medication management: Continue librium detox protocol for alcohol withdrawal, Clonidine detox protocol, Neurontin 100 mg TID for mood stabilization, Trazodone 50 mg hs prn sleep.  Encouraged patient to attend groups, take medications, participate in group counseling sessions and activities.  Discharge plan in progress. Plans to go [employer].  Continue current treatment plan.  Address health issues: Vitals reviewed and stable.   Medical Decision Making Problem Points:  Established problem, stable/improving (1), Review of last therapy session (1) and Review of psycho-social stressors (1) Data Points:  Review of medication regiment & side effects (2)  I certify that inpatient services furnished can reasonably be expected to improve the patient's condition.  Dez Stauffer NP-C 01/15/2014, 2:48 PM

## 2014-01-15 NOTE — [facility] Group Notes (Signed)
[facility] Group Notes:  (Nursing/MHT/Case Management/Adjunct)  Date:  01/15/2014  Time:  1:31 PM  Type of Therapy:  Psychoeducational Skills  Participation Level:  Did Not Attend  Did not attend group and discussion on how attitude effects addiction despite staff encouragement to attend.    Inari Shin R 01/15/2014, 1:31 PM  

## 2014-01-15 NOTE — Progress Notes (Signed)
  Patient seen, evaluated and I agree with notes by Nurse Practitioner. Pleasant Britz, MD 

## 2014-01-15 NOTE — Progress Notes (Signed)
[facility] Group Notes:  (Nursing/MHT/Case Management/Adjunct)  Date:  01/15/2014  Time:  2100  Type of Therapy:  wrap up group  Participation Level:  Active  Participation Quality:  Appropriate, Attentive, Sharing and Supportive  Affect:  Appropriate  Cognitive:  Appropriate  Insight:  Good  Engagement in Group:  Engaged  Modes of Intervention:  Clarification, Education and Support  Summary of Progress/Problems:  Shelah LewandowskySquires, Jackqulyn Mendel Carol 01/15/2014, 10:27 PM

## 2014-01-15 NOTE — Progress Notes (Signed)
D: pt is calm and cooperative. Writer explained medications to pt. Pt reconsidered retaking medication once knowledge expressed. Denies si/hi/avh. Denies pain and withdrawal symptoms. Appropriate to situation. A: Clinical research associatewriter gave pt a print out of medications. scheduled medications given. q 15 min safety checks R: pt reamains safe on unit. No signs of distress or complaints at this time

## 2014-01-15 NOTE — Progress Notes (Signed)
01-15-14 NSG NOTE  7a-3p  D: Affect is depressed.  Isolates, states tired.    Mood is depressed.  Behavior is cooperative with encouragement, direction and support.  Interacts appropriately with peers and staff.  Did not participate in self inventory group, addiction group, or counselor lead group despite staff encouragement.  Goal for today is to work on identifying healthy coping skills.   Did state plan for discharge and what changes were needed to be made post discharge to be successful.  Reports poor sleep and improving appetite.  States ability to pay attention is improving and energy level is low.  A:  Refusing medications.  Support given throughout day.  1:1 time spent with pt.  R:  Following treatment plan.  Denies HI/SI, auditory or visual hallucinations.  Contracts for safety.

## 2014-01-15 NOTE — [facility] Group Notes (Signed)
[facility] Group Notes:  (Clinical Social Work)  01/15/2014     10-11AM  Summary of Progress/Problems:   The main focus of today's process group was for the patient to identify ways in which they have in the past sabotaged their own recovery. Motivational Interviewing and a worksheet were utilized to help patients explore in depth the perceived benefits and costs of their substance use, as well as the potential benefits and costs of stopping.  The Stages of Change were explained using a handout, with an emphasis on making plans to deal with sabotaging behaviors proactively.  The patient expressed that their self-sabotaging behavior is using alcohol to forget his troubles, as well as to be able to tolerate things and people that he could otherwise not tolerate.  He stated he keeps relapsing because is not yet "through" or "done with it."  He believes that not everyone needs to attend AA or put a lot of supports in place, because he has family members who drank and are now sober without any interventions.  [name] made point that willpower alone is not sufficient, and a plan needs to be put in place.  He agreed that "if you keep doing the same thing, you're going to keep getting the same thing, which is why I am back here seeing you again."  Type of Therapy:  Group Therapy - Process   Participation Level:  Active  Participation Quality:  Attentive and Sharing  Affect:  Blunted and Depressed  Cognitive:  Appropriate  Insight:  Developing/Improving  Engagement in Therapy:  Engaged  Modes of Intervention:  Education, Support and Processing, Motivational Interviewing  Ambrose MantleMareida Grossman-Orr, LCSW 01/15/2014, 12:28 PM

## 2014-01-15 NOTE — [facility] Group Notes (Signed)
[facility] Group Notes:  (Nursing/MHT/Case Management/Adjunct)  Date:  01/15/2014  Time:  11:45 AM      Type of Therapy:  Psychoeducational Skills  Participation Level:  Active  Participation Quality:  Appropriate  Affect:  Appropriate  Cognitive:  Appropriate  Insight:  Appropriate  Engagement in Group:  Engaged  Modes of Intervention:  Discussion  Summary of Progress/Problems: Pt did attend self inventory group, pt reported that he was negative SI/HI, no AH/VH noted. Pt rated his depression as a 7, and his helplessness/hopelessness as a 5.

## 2014-01-15 NOTE — Progress Notes (Signed)
[facility] Group Notes:  (Nursing/MHT/Case Management/Adjunct)  Date:  01/15/2014  Time:  6:22 PM  Type of Therapy:  Psychoeducational Skills  Participation Level:  Active  Participation Quality:  Appropriate, Attentive, Sharing and Supportive  Affect:  Appropriate  Cognitive:  Appropriate  Insight:  Appropriate  Engagement in Group:  Engaged  Modes of Intervention:  Activity and Support  Summary of Progress/Problems: Pts played a game of Human Bingo and learned to socialize in a different setting and learned about their peers.  Nettye Flegal C 01/15/2014, 6:22 PM 

## 2014-01-16 NOTE — Progress Notes (Signed)
Patient denied SI and HI.  Contracts for safety.  Denied A/V hallucinations.  Denied pain.  Stated he feels great!  Would like to be discharged tomorrow.

## 2014-01-16 NOTE — Progress Notes (Signed)
[facility] Group Notes:  (Nursing/MHT/Case Management/Adjunct)  Date:  01/16/2014  Time:  6:21 PM  Type of Therapy:  Psychoeducational Skills  Participation Level:  Active  Participation Quality:  Appropriate, Attentive and Supportive  Affect:  Appropriate  Cognitive:  Appropriate  Insight:  Appropriate  Engagement in Group:  Engaged  Modes of Intervention:  Activity  Summary of Progress/Problems: Pts played a game of Pictionary using coping skills. Pt stated he likes to cook as his coping skill.  Caswell CorwinOwen, Shere Eisenhart C 01/16/2014, 6:21 PM

## 2014-01-16 NOTE — Progress Notes (Signed)
Patient ID: Chad Sanchez, male   DOB: [birth year], 42 y.o.   MRN: [id number] [facility] MD Progress Note  01/16/2014 1:52 PM Chad Sanchez  MRN:  [id number] Subjective:   Patient states "I just got out of [facility] on 01/14/14. I need a two year program because that was not long enough to help me. I have made my mind up this time to do better. After I get out of the program then I will find a way to get my life together."   Objective:  Patient is assessed in his room today. Spoke with patient about why he has been refusing his librium and Neurontin. Patient shows poor insight Social research officer, government that he did not really need detox. He was encouraged to comply with his recommended treatment by following medical advice. Patient admits that his noncompliance with medications could be part of why he frequently relapses. However, he becomes argumentative with Clinical research associate over his medication compliance in the hospital. Nursing staff report that patient is not attending groups and refused to fill out his self inventory. Patient does not appear invested in his treatment. His blood alcohol level was high on admission which disputes his claim of having "only a few beers" prior to coming in. Patient is currently denying withdrawal symptoms.   Diagnosis:   DSM5: Total Time spent with patient: 20 minutes  AXIS I: Generalized Anxiety Disorder and Substance Induced Mood Disorder  AXIS II: Deferred  AXIS III:  Past Medical History   Diagnosis  Date   .  Alcohol abuse    .  Depression    .  Anxiety     AXIS IV: economic problems, housing problems and occupational problems  AXIS V: 41-50 serious symptoms  ADL's:  Intact  Sleep: Good  Appetite:  Good  Suicidal Ideation:  Denies Homicidal Ideation:  Denies AEB (as evidenced by):  Psychiatric Specialty Exam: Physical Exam  Review of Systems  Constitutional: Negative.   HENT: Negative.   Eyes: Negative.   Respiratory: Negative.   Cardiovascular: Negative.    Gastrointestinal: Negative.   Genitourinary: Negative.   Musculoskeletal: Negative.   Skin: Negative.   Neurological: Negative.   Endo/Heme/Allergies: Negative.   Psychiatric/Behavioral: Positive for substance abuse. Negative for depression, suicidal ideas, hallucinations and memory loss. The patient is nervous/anxious. The patient does not have insomnia.     Blood pressure 102/71, pulse 73, temperature 97.6 F (36.4 C), temperature source Oral, resp. rate 18, height 6\' 1"  (1.854 m), weight 69.854 kg (154 lb).Body mass index is 20.32 kg/(m^2).  General Appearance: Fairly Groomed  Patent attorney::  Fair  Speech:  Clear and Coherent and Slow  Volume:  Decreased  Mood:  Anxious  Affect:  Full Range  Thought Process:  Coherent and Goal Directed  Orientation:  Full (Time, Place, and Person)  Thought Content:  worries, concerns  Suicidal Thoughts:  No  Homicidal Thoughts:  No  Memory:  Immediate;   Fair Recent;   Fair Remote;   Fair  Judgement:  Fair  Insight:  Present  Psychomotor Activity:  Restlessness  Concentration:  Fair  Recall:  [employer] of Knowledge:NA  Language: Fair  Akathisia:  No  Handed:  Right  AIMS (if indicated):     Assets:  Communication Skills Desire for Improvement Physical Health Resilience  Sleep:  Number of Hours: 6.75   Musculoskeletal: Strength & Muscle Tone: within normal limits Gait & Station: normal Patient leans: N/A  Current Medications: Current Facility-Administered Medications  Medication Dose  Route Frequency Provider Last Rate Last Dose  . acetaminophen (TYLENOL) tablet 650 mg  650 mg Oral Q6H PRN Kristeen MansFran E Hobson, NP      . alum & mag hydroxide-simeth (MAALOX/MYLANTA) 200-200-20 MG/5ML suspension 30 mL  30 mL Oral Q4H PRN Kristeen MansFran E Hobson, NP      . chlordiazePOXIDE (LIBRIUM) capsule 25 mg  25 mg Oral Q6H PRN Kristeen MansFran E Hobson, NP      . chlordiazePOXIDE (LIBRIUM) capsule 25 mg  25 mg Oral TID Kristeen MansFran E Hobson, NP       Followed by  .  chlordiazePOXIDE (LIBRIUM) capsule 25 mg  25 mg Oral BH-qamhs Kristeen MansFran E Hobson, NP       Followed by  . [START ON 01/18/2014] chlordiazePOXIDE (LIBRIUM) capsule 25 mg  25 mg Oral Daily Kristeen MansFran E Hobson, NP      . cloNIDine (CATAPRES) tablet 0.1 mg  0.1 mg Oral QID Kristeen MansFran E Hobson, NP   0.1 mg at 01/16/14 1137   Followed by  . cloNIDine (CATAPRES) tablet 0.1 mg  0.1 mg Oral BH-qamhs Kristeen MansFran E Hobson, NP       Followed by  . [START ON 01/19/2014] cloNIDine (CATAPRES) tablet 0.1 mg  0.1 mg Oral QAC breakfast Kristeen MansFran E Hobson, NP      . dicyclomine (BENTYL) tablet 20 mg  20 mg Oral Q6H PRN Kristeen MansFran E Hobson, NP      . gabapentin (NEURONTIN) capsule 100 mg  100 mg Oral TID Rachael FeeIrving A Lugo, MD   100 mg at 01/14/14 1717  . hydrOXYzine (ATARAX/VISTARIL) tablet 25 mg  25 mg Oral Q6H PRN Kristeen MansFran E Hobson, NP   25 mg at 01/15/14 2107  . loperamide (IMODIUM) capsule 2-4 mg  2-4 mg Oral PRN Kristeen MansFran E Hobson, NP      . magnesium hydroxide (MILK OF MAGNESIA) suspension 30 mL  30 mL Oral Daily PRN Kristeen MansFran E Hobson, NP      . methocarbamol (ROBAXIN) tablet 500 mg  500 mg Oral Q8H PRN Kristeen MansFran E Hobson, NP      . multivitamin with minerals tablet 1 tablet  1 tablet Oral Daily Kristeen MansFran E Hobson, NP   1 tablet at 01/16/14 16100811  . naproxen (NAPROSYN) tablet 500 mg  500 mg Oral BID PRN Kristeen MansFran E Hobson, NP      . nicotine (NICODERM CQ - dosed in mg/24 hours) patch 21 mg  21 mg Transdermal Daily Kristeen MansFran E Hobson, NP      . ondansetron (ZOFRAN-ODT) disintegrating tablet 4 mg  4 mg Oral Q6H PRN Kristeen MansFran E Hobson, NP      . traZODone (DESYREL) tablet 50 mg  50 mg Oral QHS PRN Kristeen MansFran E Hobson, NP        Lab Results:  No results found for this or any previous visit (from the past 48 hour(s)).  Physical Findings: AIMS: Facial and Oral Movements Muscles of Facial Expression: None, normal Lips and Perioral Area: None, normal Jaw: None, normal Tongue: None, normal,Extremity Movements Upper (arms, wrists, hands, fingers): None, normal Lower (legs, knees, ankles,  toes): None, normal, Trunk Movements Neck, shoulders, hips: None, normal, Overall Severity Severity of abnormal movements (highest score from questions above): None, normal Incapacitation due to abnormal movements: None, normal Patient's awareness of abnormal movements (rate only patient's report): No Awareness, Dental Status Current problems with teeth and/or dentures?: No Does patient usually wear dentures?: No  CIWA:  CIWA-Ar Total: 1 COWS:  COWS Total Score: 3  Treatment Plan Summary:  Daily contact with patient to assess and evaluate symptoms and progress in treatment Medication management  Plan: Continue crisis management and stabilization.  Medication management: Continue librium detox protocol for alcohol withdrawal, Clonidine detox protocol, Neurontin 100 mg TID for mood stabilization, Trazodone 50 mg hs prn sleep.  Encouraged patient to attend groups, take medications, participate in group counseling sessions and activities.  Discharge plan in progress. Plans to go [employer].  Continue current treatment plan.  Address health issues: Vitals reviewed and stable.   Medical Decision Making Problem Points:  Established problem, stable/improving (1), Review of last therapy session (1) and Review of psycho-social stressors (1) Data Points:  Review of medication regiment & side effects (2)  I certify that inpatient services furnished can reasonably be expected to improve the patient's condition.   Fransisca Kaufmann NP-C 01/16/2014, 1:52 PM  Patient seen, evaluated and I agree with notes by Nurse Practitioner. Thedore Mins, MD

## 2014-01-16 NOTE — Progress Notes (Signed)
Patient ID: Chad PillowDarrius Tyrone Colello, male   DOB: [birth year], 43 y.o.   MRN: [id number] D: Pt is awake and active on the unit this AM. Pt denies SI/HI and A/V hallucinations. Pt would not complete self inventory today and is not attending groups. Pt mood is apathetic and his affect is flat. Pt's most recent CIWA score was 0. Pt is refusing all medications including Librium except for clonidine and vitamins. Pt does not seem vested in treatment, although he is cooperative with staff.   A: Encouraged pt to discuss feelings with staff and administered medication per MD orders. Writer also encouraged pt to participate in groups.  R: Writer will continue to monitor. 15 minute checks are ongoing for safety.

## 2014-01-16 NOTE — Progress Notes (Signed)
D: pt seen sitting in dayroom. Denies si/hi/avh. Denies having any withdrawal symptoms. Pt refused clonidine and librium this evening. Pt stated there is no reason for me to be on these meds, i am not having anymore withdrawals. Pt stated he is ready to leave. denies pain. Pt is calm and pleasant. Appropriate to situation.  A: 1:1 time given. q 15 min safety checks R: pt remains safe on unit. No signs of distress or complaints at this time

## 2014-01-16 NOTE — B[facility] Group Notes (Signed)
[facility] Group Notes:  (Clinical Social Work)  01/16/2014  10:00-11:00AM  Summary of Progress/Problems:   The main focus of today's process group was to   identify the patient's current support system and decide on other supports that can be put in place.  The picture on workbook was used to discuss why additional supports are needed.  An emphasis was placed on using counselor, doctor, therapy groups, 12-step groups, and problem-specific support groups to expand supports.   There was also an extensive discussion about what constitutes a healthy support versus an unhealthy support.  The patient expressed full comprehension of the concepts presented, and agreed that there is a need to add more supports.  He stated that when he got out of [facility], he gave his ex-wife management of his check and this has really worked for him.  He encouraged others in group to give up pride if it means saving their life.  He admitted that he used to keep using because he thought he was not as bad as the homeless people he saw, that he "hadn't gone that far yet," but he later realized that he is just as bad in his addiction as anyone else.    Type of Therapy:  Process Group with Motivational Interviewing  Participation Level:  Active  Participation Quality:  Attentive, Sharing and Supportive  Affect:  Blunted  Cognitive:  Alert, Appropriate and Oriented  Insight:  Engaged  Engagement in Therapy:  Engaged  Modes of Intervention:   Education, Support and Processing, Activity  Pilgrim's PrideMareida Grossman-Orr, LCSW 01/16/2014, 12:15pm

## 2014-01-16 NOTE — BHH Group Notes (Signed)
Psychoeducational Group Note  Date:  01/16/2014 Time:  0900  Group Topic/Focus:  Orientation:   The focus of this group is to educate the patient on the purpose and policies of crisis stabilization and provide a format to answer questions about their admission.  The group details unit policies and expectations of patients while admitted.  Participation Level: Did Not Attend  Participation Quality:  Not Applicable  Affect:  Not Applicable  Cognitive:  Not Applicable  Insight:  Not Applicable  Engagement in Group: Not Applicable  Additional Comments:    Jule SerKent, Ahmia Colford Gail 01/16/2014, 10:50 AM

## 2014-01-16 NOTE — Progress Notes (Signed)
Patient did not attend the evening speaker AA meeting. Pt remained in bed during group time.   

## 2014-01-17 DIAGNOSIS — F1020 Alcohol dependence, uncomplicated: Principal | ICD-10-CM

## 2014-01-17 MED ORDER — GABAPENTIN 100 MG PO CAPS: 100.0000 mg | ORAL_CAPSULE | Freq: Three times a day (TID) | ORAL | Status: DC

## 2014-01-17 MED ORDER — TRAZODONE HCL 50 MG PO TABS: 50.0000 mg | ORAL_TABLET | Freq: Every evening | ORAL | Status: DC | PRN

## 2014-01-17 NOTE — Discharge Summary (Signed)
Physician Discharge Summary Note  Patient:  Chad Sanchez is an 43 y.o., male MRN:  [id number] DOB:  [birth year] Patient phone:  [phone] (home)  Patient address:   [address] [place] [address],  Total Time spent with patient: Greater than 30 minutes  Date of Admission:  01/14/2014  Date of Discharge: 01/17/14  Reason for Admission: Alcohol detox  Discharge Diagnoses: Active Problems:   Alcohol dependence   GAD (generalized anxiety disorder)   Psychiatric Specialty Exam: Physical Exam  Constitutional: He is oriented to person, place, and time. He appears well-developed.  HENT:  Head: Normocephalic.  Eyes: Pupils are equal, round, and reactive to light.  Neck: Normal range of motion.  Cardiovascular: Normal rate.   Respiratory: Effort normal.  GI: Soft.  Genitourinary:  Denies any issues in this area  Musculoskeletal: Normal range of motion.  Neurological: He is alert and oriented to person, place, and time.  Skin: Skin is warm and dry.  Psychiatric: His speech is normal and behavior is normal. Judgment and thought content normal. His mood appears not anxious. His affect is not angry, not blunt, not labile and not inappropriate. Cognition and memory are normal. He does not exhibit a depressed mood.    Review of Systems  Constitutional: Negative.   HENT: Negative.   Eyes: Negative.   Respiratory: Negative.   Cardiovascular: Negative.   Gastrointestinal: Negative.   Genitourinary: Negative.   Musculoskeletal: Negative.   Skin: Negative.   Neurological: Negative.   Endo/Heme/Allergies: Negative.   Psychiatric/Behavioral: Positive for substance abuse (Alcoholism. chronic). Negative for depression, suicidal ideas, hallucinations and memory loss. The patient is nervous/anxious and has insomnia (Stable).     Blood pressure 96/66, pulse 86, temperature 97.4 F (36.3 C), temperature source Oral, resp. rate 16, height 6\' 1"   (1.854 m), weight 69.854 kg (154 lb).Body mass index is 20.32 kg/(m^2).  General Appearance: Casual and Fairly Groomed  Patent attorney::  Good  Speech:  Clear and Coherent  Volume:  Normal  Mood:  Stable  Affect:  Appropriate and Congruent  Thought Process:  Coherent  Orientation:  Full (Time, Place, and Person)  Thought Content:  Denies any hallucinations, delusions, paranoia.  Suicidal Thoughts:  No  Homicidal Thoughts:  No  Memory:  Immediate;   Good Recent;   Good Remote;   Good  Judgement:  Intact  Insight:  Present  Psychomotor Activity:  Normal  Concentration:  Good  Recall:  Good  Fund of Knowledge:Fair  Language: Good  Akathisia:  No  Handed:  Right  AIMS (if indicated):     Assets:  Desire for Improvement  Sleep:  Number of Hours: 5.75    Past Psychiatric History: Diagnosis: Alcohol related disorder - severe, Substance induced mood disorder, Generalized anxiety disorder  Hospitalizations: [facility] adult unit  Outpatient Care: [facility]  Substance Abuse Care: [facility]  Self-Mutilation: Denies  Suicidal Attempts: NA  Violent Behaviors: NA   Musculoskeletal: Strength & Muscle Tone: within normal limits Gait & Station: normal Patient leans: N/A  DSM5: Schizophrenia Disorders:  NA Obsessive-Compulsive Disorders:  NA Trauma-Stressor Disorders:  NA Substance/Addictive Disorders:  Alcohol Related Disorder - Severe (303.90) Depressive Disorders:  Substance induced mood disorder, Generalized anxiety disorder  Axis Diagnosis:   AXIS I:  Alcohol related disorder - severe, Substance induced mood disorder, Generalized anxiety disorder AXIS II:  Deferred AXIS III:   Past Medical History  Diagnosis Date  . Alcohol abuse   . Depression   .  Anxiety    AXIS IV:  Alcoholism, chronic AXIS V:  63  Level of Care:  OP  Hospital Course: 43 Y/O male who states that he is trying to get into the [facility]. He was asked to come here for detox. States when he left here last  time (Nov 2014)he went to his home town as he was planning to. While he was there he drank every day. States he would take sips not to have the shakes. He decided to pursue the [address] program. He admits to cocaine every now and then. He had an interview with [facility] and he told them he had "drunk a little something." that day so he was sent here for "detox" and clearance. A week or so ago he had experienced fever malaise came to the ED. He was told it had to do with his withdrawal.  Chad Sanchez was admitted to the hospital with a blood alcohol level of 194 and positive cocaine per UDS reports. He was intoxicated. He required detox treatment and was ordered Librium/clonidine detox protocols. He was also enrolled in group counseling sessions/activities and AA/NA meetings being offered and held on this unit. Besides the detoxification treatments, Chad Sanchez also was ordered and received Gabapentin 100 mg three times daily for substance withdrawal syndrome and Trazodone 50 mg Q bedtime for sleep. He presented no other medical issues that required treatment and or monitoring.  Chad Sanchez has completed detoxification treatment, he is currently being discharged to follow-up care at the [facility] here in [place], [place]. He has been provided with all the pertinent information required to make the appointment without problems. Upon discharge, Chad Sanchez adamantly denies any SIHI, AVH, delusions, paranoia and or withdrawal symptoms. He received from [facility] a 14 days worth supply samples of his [facility] discharge medications. He left [facility] with all personal belongings in no distress. Transportation per city bus. Bus pass provided by [facility]  Consults:  psychiatry  Significant Diagnostic Studies:  labs: CBC with diff, CMP, UDS, toxicology tests, U/A  Discharge Vitals:   Blood pressure 96/66, pulse 86, temperature 97.4 F (36.3 C), temperature source Oral, resp. rate 16, height 6\' 1"  (1.854 m), weight 69.854 kg (154  lb). Body mass index is 20.32 kg/(m^2). Lab Results:   No results found for this or any previous visit (from the past 72 hour(s)).  Physical Findings: AIMS: Facial and Oral Movements Muscles of Facial Expression: None, normal Lips and Perioral Area: None, normal Jaw: None, normal Tongue: None, normal,Extremity Movements Upper (arms, wrists, hands, fingers): None, normal Lower (legs, knees, ankles, toes): None, normal, Trunk Movements Neck, shoulders, hips: None, normal, Overall Severity Severity of abnormal movements (highest score from questions above): None, normal Incapacitation due to abnormal movements: None, normal Patient's awareness of abnormal movements (rate only patient's report): No Awareness, Dental Status Current problems with teeth and/or dentures?: No Does patient usually wear dentures?: No  CIWA:  CIWA-Ar Total: 0 COWS:  COWS Total Score: 3  Psychiatric Specialty Exam: See Psychiatric Specialty Exam and Suicide Risk Assessment completed by Attending Physician prior to discharge.  Discharge destination:  Home  Is patient on multiple antipsychotic therapies at discharge:  No   Has Patient had three or more failed trials of antipsychotic monotherapy by history:  No  Recommended Plan for Multiple Antipsychotic Therapies: NA     Medication List       Indication   gabapentin 100 MG capsule  Commonly known as:  NEURONTIN  Take 1 capsule (100 mg total)  by mouth 3 (three) times daily. For substance withdrawal syndrome   Indication:  Pain, Substance withdrawal syndrome     traZODone 50 MG tablet  Commonly known as:  DESYREL  Take 1 tablet (50 mg total) by mouth at bedtime as needed for sleep.   Indication:  Trouble Sleeping       Follow-up Information   Follow up with [facility]. (Walk in between 8am-9am Monday through Friday for hospital follow-up/medication management/assessment for therapy services. )    Contact information:   [address]   [place], [place] [address] Phone: [phone] Fax: [phone]     Follow-up recommendations: Activity:  As tolerated Diet: As recommended by your primary care doctor. Keep all scheduled follow-up appointments as recommended.   Comments: Take all your medications as prescribed by your mental healthcare provider. Report any adverse effects and or reactions from your medicines to your outpatient provider promptly. Patient is instructed and cautioned to not engage in alcohol and or illegal drug use while on prescription medicines. In the event of worsening symptoms, patient is instructed to call the crisis hotline, 911 and or go to the nearest ED for appropriate evaluation and treatment of symptoms. Follow-up with your primary care provider for your other medical issues, concerns and or health care needs.   Total Discharge Time:  Greater than 30 minutes.  Signed: Sanjuana Kavawoko, Agnes I, PMHNP-BC 01/17/2014, 10:06 AM Personally evaluated the patient and agree with assessment and plan Madie RenoIrving A. Dub MikesLugo, M.D.

## 2014-01-17 NOTE — [facility] Group Notes (Signed)
[facility]   01/17/2014 10:45 AM  Participation Quality:  Appropriate   Mood/Affect:  Appropriate  Depression Rating:  0  Anxiety Rating:  0  Thoughts of Suicide:  No Will you contract for safety?   NA  Current AVH:  No  Plan for Discharge/Comments:  Pt plans to go to [place] to retrieve necessary documents to enter [facility] [employer]birth certificate and plans to return [facility] the [facility] (letter provided by [name] in chart for [facility] [employer] [employer] per pt request) until pt officially accepted into [employer] [facility] [address] program. [facility] [facility].   Transportation Means: pt provided with $8 part bus money for two trips and 2 [place] bus passes.   Supports: family supports ([place], [place])  Smart, OncologistHeather LCSWA

## 2014-01-17 NOTE — Tx Team (Signed)
Interdisciplinary Treatment Plan Update (Adult)  Date: 01/17/2014   Time Reviewed: 10:47 AM  Progress in Treatment:  Attending groups: No.  Participating in groups: no.    Taking medication as prescribed: Yes  Tolerating medication: Yes  Family/Significant othe contact made: Pt refused to consent to family contact. SPE completed with pt.  Patient understands diagnosis: Yes, AEB seeking treatment for ETOH detox, cocaine abuse, mood stabilization, and passive SI.  Discussing patient identified problems/goals with staff: Yes  Medical problems stabilized or resolved: Yes  Denies suicidal/homicidal ideation: Yes during self report.  Patient has not harmed self or Others: Yes  New problem(s) identified:  Discharge Plan or Barriers: Pt plans to go to [place] to retrieve necessary documents [facility] and plans to return to the [employer] [facility] (letter provided by [name] in chart for [employer] per pt request) until pt officially accepted into [employer] [facility] [address] program. [facility] [employer] [facility]. Pt provided with part bus money and bus passes.  Additional comments:  Reason for Continuation of Hospitalization: d/c today  Estimated length of stay: d/c today at 11AM For review of initial/current patient goals, please see plan of care.  Attendees:  Patient:    Family:    Physician: Geoffery LyonsIrving Lugo MD 01/17/2014 10:47 AM   Nursing: Lupita Leashonna RN  01/17/2014 10:47 AM   Clinical Social Worker Henleigh Robello Smart, LCSWA  01/17/2014 10:47 AM   Other:  01/17/2014 10:47 AM   Other: Darden DatesJennifer C. Nurse CM 01/17/2014 10:47 AM   Other:  01/17/2014 10:47 AM   Other: Chandra BatchAggie N. PA 01/17/2014 10:47 AM   Scribe for Treatment Team:  Trula SladeHeather Smart LCSWA 01/17/2014 10:47 AM

## 2014-01-17 NOTE — Progress Notes (Signed)
Adult Psychoeducational Group Note  Date:  01/17/2014 Time:  10:00 am  Group Topic/Focus:  Wellness Toolbox:   The focus of this group is to discuss various aspects of wellness, balancing those aspects and exploring ways to increase the ability to experience wellness.  Patients will create a wellness toolbox for use upon discharge.  Participation Level:  Active  Participation Quality:  Appropriate, Sharing and Supportive  Affect:  Appropriate  Cognitive:  Appropriate  Insight: Appropriate  Engagement in Group:  Engaged  Modes of Intervention:  Discussion, Education, Socialization and Support  Additional Comments:  Pt stated that by practicing healthy coping skills and being honest with himself that he can promote health and wellness in his life. Pt stated that "he" is the only one that can keep himself from making progress with the goal.   Laural BenesJohnson, Kaydon Creedon 01/17/2014, 11:02 AM

## 2014-01-17 NOTE — Progress Notes (Signed)
[facility] :  Will you be returning to the same living situation after discharge: Yes,  [facility]. At discharge, do you have transportation home?:Yes,  bus pass/part bus money in chart. Do you have the ability to pay for your medications:Yes,  mental health.  Release of information consent forms completed and submitted to Medical Records by [name].  Patient to Follow up at: Follow-up Information   Follow up with [facility]. (Walk in between 8am-9am Monday through Friday for hospital follow-up/medication management/assessment for therapy services. )    Contact information:   [address]  [place], [place] [address] Phone: [phone] Fax: [phone]      Patient denies SI/HI:   Yes,  during group/self report.    Safety Planning and Suicide Prevention discussed:  Yes,  Pt refused to consent to family contact. SPE completed with pt. SPI pamphlet provided to pt and he was encouraged to share information with support network, ask questions, and talk about any concerns relating to SPE.  Smart, Shawon Denzer LCSWA  01/17/2014, 10:50 AM

## 2014-01-17 NOTE — Progress Notes (Signed)
Patient ID: Chad PillowDarrius Tyrone Sanchez, male   DOB: 06/06/1971, 43 y.o.   MRN: [id number] D:  [facility] treatment/rehab.  All belongings retrieved from room and from locker.  Denies suicidal ideation.   A:  Reviewed all discharge instructions, medications, and follow up care.  Patient was given a two week supply of medications from the hospital pharmacy with prescriptions for renewals.  He was also given a bus pass and cash for the [facility] bus.  Items removed from locker.   R:  Verbalized understanding of all discharge information.  Escorted off the unit, to the search room and out the front door.  He will walk to the bus stop.

## 2014-01-17 NOTE — [facility] Suicide Risk Assessment (Signed)
Suicide Risk Assessment  Admission Assessment     Nursing information obtained from:    Demographic factors:    Current Mental Status:    Loss Factors:    Historical Factors:    Risk Reduction Factors:    Total Time spent with patient: 45 minutes  CLINICAL FACTORS:   Alcohol/Substance Abuse/Dependencies  Psychiatric Specialty Exam:     Blood pressure 96/66, pulse 86, temperature 97.4 F (36.3 C), temperature source Oral, resp. rate 16, height 6\' 1"  (1.854 m), weight 69.854 kg (154 lb).Body mass index is 20.32 kg/(m^2).  General Appearance: Fairly Groomed  Patent attorneyye Contact::  Good  Speech:  Clear and Coherent  Volume:  Increased  Mood:  Euthymic  Affect:  Appropriate  Thought Process:  Coherent and Goal Directed  Orientation:  Full (Time, Place, and Person)  Thought Content:  relapse prevention plan  Suicidal Thoughts:  No  Homicidal Thoughts:  No  Memory:  Immediate;   Fair Recent;   Fair Remote;   Fair  Judgement:  Fair  Insight:  Present  Psychomotor Activity:  Normal  Concentration:  Fair  Recall:  FiservFair  Fund of Knowledge:NA  Language: Fair  Akathisia:  No  Handed:  Right  AIMS (if indicated):     Assets:  Desire for Improvement Social Support Vocational/Educational  Sleep:  Number of Hours: 5.75   Musculoskeletal: Strength & Muscle Tone: within normal limits Gait & Station: normal Patient leans: N/A  COGNITIVE FEATURES THAT CONTRIBUTE TO RISK:  Closed-mindedness Polarized thinking Thought constriction (tunnel vision)    SUICIDE RISK:   Minimal: No identifiable suicidal ideation.  Patients presenting with no risk factors but with morbid ruminations; may be classified as minimal risk based on the severity of the depressive symptoms  PLAN OF CARE: Alcohol Dependence, GAD, Substance Induced Mood Disorder  I certify that inpatient services furnished can reasonably be expected to improve the patient's condition.  Tamim Skog A 01/17/2014, 11:18 AM

## 2014-01-22 ENCOUNTER — Encounter (HOSPITAL_COMMUNITY): Payer: Self-pay | Admitting: Emergency Medicine

## 2014-01-22 DIAGNOSIS — F17200 Nicotine dependence, unspecified, uncomplicated: Secondary | ICD-10-CM | POA: Insufficient documentation

## 2014-01-22 DIAGNOSIS — F3289 Other specified depressive episodes: Secondary | ICD-10-CM | POA: Insufficient documentation

## 2014-01-22 DIAGNOSIS — F411 Generalized anxiety disorder: Secondary | ICD-10-CM | POA: Insufficient documentation

## 2014-01-22 DIAGNOSIS — Z79899 Other long term (current) drug therapy: Secondary | ICD-10-CM | POA: Insufficient documentation

## 2014-01-22 DIAGNOSIS — F1020 Alcohol dependence, uncomplicated: Secondary | ICD-10-CM | POA: Insufficient documentation

## 2014-01-22 DIAGNOSIS — F329 Major depressive disorder, single episode, unspecified: Secondary | ICD-10-CM | POA: Insufficient documentation

## 2014-01-22 NOTE — ED Notes (Signed)
The pt reports that he is a alcoholic and wants to detox.  Last aslcohol was 2-3 hours ago

## 2014-01-23 ENCOUNTER — Emergency Department (HOSPITAL_COMMUNITY)
Admission: EM | Admit: 2014-01-23 | Discharge: 2014-01-23 | Disposition: A | Discharge status: Home / Self Care | Payer: Self-pay | Attending: Emergency Medicine | Admitting: Emergency Medicine

## 2014-01-23 ENCOUNTER — Encounter (HOSPITAL_COMMUNITY): Payer: Self-pay | Admitting: Emergency Medicine

## 2014-01-23 DIAGNOSIS — F1020 Alcohol dependence, uncomplicated: Secondary | ICD-10-CM

## 2014-01-23 LAB — RAPID URINE DRUG SCREEN, HOSP PERFORMED
AMPHETAMINES: NOT DETECTED
BENZODIAZEPINES: NOT DETECTED
Barbiturates: NOT DETECTED
Cocaine: NOT DETECTED
Opiates: NOT DETECTED
Tetrahydrocannabinol: NOT DETECTED

## 2014-01-23 LAB — COMPREHENSIVE METABOLIC PANEL
ALBUMIN: 4.2 g/dL (ref 3.5–5.2)
ALT: 28 U/L (ref 0–53)
AST: 35 U/L (ref 0–37)
Alkaline Phosphatase: 61 U/L (ref 39–117)
BILIRUBIN TOTAL: 0.3 mg/dL (ref 0.3–1.2)
BUN: 7 mg/dL (ref 6–23)
CALCIUM: 9.1 mg/dL (ref 8.4–10.5)
CO2: 22 mEq/L (ref 19–32)
CREATININE: 0.92 mg/dL (ref 0.50–1.35)
Chloride: 98 mEq/L (ref 96–112)
GFR calc Af Amer: >90 mL/min (ref >90)
GFR calc non Af Amer: >90 mL/min (ref >90)
Glucose, Bld: 90 mg/dL (ref 70–99)
Potassium: 4.3 mEq/L (ref 3.7–5.3)
Sodium: 138 mEq/L (ref 137–147)
TOTAL PROTEIN: 8 g/dL (ref 6.0–8.3)

## 2014-01-23 LAB — CBC
HEMATOCRIT: 40 % (ref 39.0–52.0)
Hemoglobin: 14 g/dL (ref 13.0–17.0)
MCH: 27.8 pg (ref 26.0–34.0)
MCHC: 35 g/dL (ref 30.0–36.0)
MCV: 79.5 fL (ref 78.0–100.0)
PLATELETS: 363 10*3/uL (ref 150–400)
RBC: 5.03 MIL/uL (ref 4.22–5.81)
RDW: 15.1 % (ref 11.5–15.5)
WBC: 6.1 10*3/uL (ref 4.0–10.5)

## 2014-01-23 LAB — ETHANOL: ALCOHOL ETHYL (B): 279 mg/dL — AB (ref 0–11)

## 2014-01-23 MED ORDER — ONDANSETRON HCL 4 MG PO TABS: 4.0000 mg | ORAL_TABLET | Freq: Three times a day (TID) | ORAL | Status: DC | PRN

## 2014-01-23 MED ORDER — LORAZEPAM 2 MG/ML IJ SOLN: 0.0000 mg | Freq: Two times a day (BID) | INTRAMUSCULAR | Status: DC

## 2014-01-23 MED ORDER — TRAZODONE HCL 50 MG PO TABS: 50.0000 mg | ORAL_TABLET | Freq: Every evening | ORAL | Status: DC | PRN

## 2014-01-23 MED ORDER — ALUM & MAG HYDROXIDE-SIMETH 200-200-20 MG/5ML PO SUSP: 30.0000 mL | ORAL | Status: DC | PRN

## 2014-01-23 MED ORDER — LORAZEPAM 2 MG/ML IJ SOLN: 0.0000 mg | Freq: Four times a day (QID) | INTRAMUSCULAR | Status: DC

## 2014-01-23 MED ORDER — THIAMINE HCL 100 MG/ML IJ SOLN: 100.0000 mg | Freq: Every day | INTRAMUSCULAR | Status: DC

## 2014-01-23 MED ORDER — LORAZEPAM 1 MG PO TABS
1.0000 mg | ORAL_TABLET | Freq: Four times a day (QID) | ORAL | Status: DC | PRN
  Administered 2014-01-23 (×2): 1 mg via ORAL
  Filled 2014-01-23 (×2): qty 1

## 2014-01-23 MED ORDER — GABAPENTIN 100 MG PO CAPS
100.0000 mg | ORAL_CAPSULE | Freq: Three times a day (TID) | ORAL | Status: DC
  Filled 2014-01-23 (×3): qty 1

## 2014-01-23 MED ORDER — VITAMIN B-1 100 MG PO TABS: 100.0000 mg | ORAL_TABLET | Freq: Every day | ORAL | Status: DC

## 2014-01-23 MED ORDER — IBUPROFEN 400 MG PO TABS: 600.0000 mg | ORAL_TABLET | Freq: Three times a day (TID) | ORAL | Status: DC | PRN

## 2014-01-23 MED ORDER — ACETAMINOPHEN 325 MG PO TABS: 650.0000 mg | ORAL_TABLET | ORAL | Status: DC | PRN

## 2014-01-23 MED ORDER — ZOLPIDEM TARTRATE 5 MG PO TABS: 5.0000 mg | ORAL_TABLET | Freq: Every evening | ORAL | Status: DC | PRN

## 2014-01-23 MED ORDER — NICOTINE 21 MG/24HR TD PT24: 21.0000 mg | MEDICATED_PATCH | Freq: Every day | TRANSDERMAL | Status: DC

## 2014-01-23 NOTE — ED Notes (Addendum)
Pt given bus pass and referrals for etoh assistance

## 2014-01-23 NOTE — BH Assessment (Signed)
Tele Assessment Note   Chad Sanchez is an 43 y.o. male who presents to [facility] ED requesting detox from alcohol. Pt stated "I still have to drink to keep from being sick". Pt is alert and oriented x3. Pt denied SI/HI/AH/VH at the present time. Pt denied any previous mental health treatment; however he reported that he has been hospitalized multiple times for detox. Pt reported that he had suicide attempt several months ago. Pt reported that he attempted to walk in front of a moving car. Pt endorsed symptoms of depression. Pt reported that he has a long history of alcohol abuse. Pt stated that "my biological mother was intoxicated when I was born". Pt reported that he has been drinking a case of beer daily since 2011. Pt also reported that he uses cocaine several times a week and his most recent use was less than a week ago. Pt reported that he is currently experiencing withdrawal symptoms such as "shakes", tremors, and nausea. Pt reported that he was able to remain sober for 4 years in the past and relapsed due to life stressors and not utilizing his coping skills. Pt reported now he has to drink to keep from being sick. Pt reported that he also  has to drink in order to have an appetite and to get a good night's reset. Pt denied any physical, sexual or emotional abuse. Pt identified his aunt as his support system.   Axis I: Generalized Anxiety Disorder, Substance Induced Mood Disorder and Alcohol Dependence  Axis II: Deferred Axis III:  Past Medical History  Diagnosis Date  . Alcohol abuse   . Depression   . Anxiety    Axis IV: other psychosocial or environmental problems Axis V: 41-50 serious symptoms  Past Medical History:  Past Medical History  Diagnosis Date  . Alcohol abuse   . Depression   . Anxiety     Past Surgical History  Procedure Laterality Date  . Tonsillectomy      Family History: No family history on file.  Social History:  reports that he has been smoking  Cigarettes.  He has been smoking about 1.00 pack per day. He has never used smokeless tobacco. He reports that he drinks about 0.6 ounces of alcohol per week. He reports that he uses illicit drugs ("Crack" cocaine and Cocaine).  Additional Social History:  Alcohol / Drug Use Pain Medications: denies abuse  Prescriptions: denies abuse  Over the Counter: denies abuse  History of alcohol / drug use?: Yes Longest period of sobriety (when/how long): Pt reported that he was able to remain sober for 4 years in the past.  Negative Consequences of Use: Financial;Legal;Personal relationships;Work / Programmer, multimedia Withdrawal Symptoms: Irritability;Tremors Substance #1 Name of Substance 1: Alcohol  1 - Age of First Use: 12 1 - Amount (size/oz): "1 case of beer" 1 - Frequency: daily  1 - Duration: "Since 2011" 1 - Last Use / Amount: "2-40oz beers around 8 pm".  Substance #2 Name of Substance 2: Cocaine  2 - Age of First Use: "31" 2 - Amount (size/oz): "less than 1 gram"  2 - Frequency: "2 times a week" 2 - Duration: years 2 - Last Use / Amount: "1 week ago- less than 1 gram".  CIWA: CIWA-Ar BP: 118/72 mmHg Pulse Rate: 104 Nausea and Vomiting: no nausea and no vomiting Tactile Disturbances: none Tremor: six Auditory Disturbances: not present Paroxysmal Sweats: no sweat visible Visual Disturbances: not present Anxiety: three Headache, Fullness in Head: none present Agitation:  normal activity Orientation and Clouding of Sensorium: oriented and can do serial additions CIWA-Ar Total: 9 COWS:    Allergies:  Allergies  Allergen Reactions  . Shellfish Allergy Anaphylaxis    Home Medications:  (Not in a hospital admission)  OB/GYN Status:  No LMP for male patient.  General Assessment Data [facility] Is this a Tele or Face-to-Face Assessment?: Tele Assessment Is this an Initial Assessment or a Re-assessment for this encounter?: Initial Assessment Living Arrangements:  Non-relatives/Friends Can pt return to current living arrangement?: Yes Admission Status: Voluntary Is patient capable of signing voluntary admission?: Yes Transfer from: Home Referral Source: Self/Family/Friend     [facility] Living Arrangements: Non-relatives/Friends  Education Status Is patient currently in school?: No  Risk to self Suicidal Ideation: No Suicidal Intent: No Is patient at risk for suicide?: No Suicidal Plan?: No Access to Means: No What has been your use of drugs/alcohol within the last 12 months?: daily Previous Attempts/Gestures: Yes How many times?: 1 Other Self Harm Risks: none identified at this time Triggers for Past Attempts: Unknown Intentional Self Injurious Behavior: None Family Suicide History: Unknown Recent stressful life event(s): Financial Problems Persecutory voices/beliefs?: No Depression: Yes Depression Symptoms: Despondent;Insomnia;Tearfulness;Isolating;Fatigue;Guilt;Loss of interest in usual pleasures;Feeling worthless/self pity;Feeling angry/irritable Substance abuse history and/or treatment for substance abuse?: Yes Suicide prevention information given to non-admitted patients: Not applicable  Risk to Others Homicidal Ideation: No Thoughts of Harm to Others: No Current Homicidal Intent: No Current Homicidal Plan: No Access to Homicidal Means: No Identified Victim: n/a History of harm to others?: No Assessment of Violence: None Noted Violent Behavior Description: n/a Does patient have access to weapons?: No Criminal Charges Pending?: No Does patient have a court date: No  Psychosis Hallucinations: None noted Delusions: None noted  Mental Status Report Appear/Hygiene: Other (Comment) [facility](H[facility] Scrub[facility]) Eye Contact: Fair Motor Activity: Freedom of movement Speech: Logical/coherent Level of Consciousness: Quiet/awake Mood: Depressed Affect: Depressed Anxiety Level: Minimal Thought Processes:  Coherent;Relevant Judgement: Impaired Orientation: Place;Person;Time;Situation Obsessive Compulsive Thoughts/Behaviors: None  Cognitive Functioning Concentration: Normal Memory: Recent Intact;Remote Impaired IQ: Average Insight: Fair Impulse Control: Fair Appetite: Good Weight Loss: 0 Weight Gain: 0 Sleep: Decreased Total Hours of Sleep: 5 Vegetative Symptoms: None  ADLScreening [facility] Patient's cognitive ability adequate to safely complete daily activities?: Yes Patient able to express need for assistance with ADLs?: Yes Independently performs ADLs?: Yes (appropriate for developmental age)  Prior Inpatient Therapy Prior Inpatient Therapy: Yes Prior Therapy Dates: 2014, 2015 Prior Therapy Facilty/Provider(s): [facility], [facility] Reason for Treatment: Detox   Prior Outpatient Therapy Prior Outpatient Therapy: No Prior Therapy Dates: None  Prior Therapy Facilty/Provider(s): None  Reason for Treatment: None   ADL Screening (condition at time of admission) Patient's cognitive ability adequate to safely complete daily activities?: Yes Is the patient deaf or have difficulty hearing?: No Does the patient have difficulty seeing, even when wearing glasses/contacts?: No Does the patient have difficulty concentrating, remembering, or making decisions?: No Patient able to express need for assistance with ADLs?: Yes Does the patient have difficulty dressing or bathing?: No Independently performs ADLs?: Yes (appropriate for developmental age) Does the patient have difficulty walking or climbing stairs?: No Weakness of Legs: None Weakness of Arms/Hands: None       Abuse/Neglect Assessment (Assessment to be complete while patient is alone) Physical Abuse: Denies Verbal Abuse: Denies Sexual Abuse: Denies Exploitation of patient/patient's resources: Denies Self-Neglect: Denies     Merchant navy officerAdvance Directives (For Healthcare) Advance Directive: Patient  does not have advance  directive    Additional Information 1:1 In Past 12 Months?: No CIRT Risk: No Elopement Risk: No Does patient have medical clearance?: Yes     Disposition: Consulted with Alberteen Sam, NP who agrees that pt meets inpatient criteria. Notified Francee Piccolo, PA-C of recommendations. [facility] at capacity. TTS will seek placement at other facilities.  Disposition Initial Assessment Completed for this Encounter: Yes Disposition of Patient: Inpatient treatment program Type of inpatient treatment program: Adult Patient referred to: [facility]  Alric Geise S 01/23/2014 3:40 AM

## 2014-01-23 NOTE — ED Provider Notes (Signed)
Medical screening examination/treatment/procedure(s) were performed by non-physician practitioner and as supervising physician I was immediately available for consultation/collaboration.    Lequan Dobratz D Tonia Avino, MD 01/23/14 0716 

## 2014-01-23 NOTE — Discharge Instructions (Signed)
°Emergency Department Resource Guide °1) Find a Doctor and Pay Out of Pocket °Although you won't have to find out who is covered by your insurance plan, it is a good idea to ask around and get recommendations. You will then need to call the office and see if the doctor you have chosen will accept you as a new patient and what types of options they offer for patients who are self-pay. Some doctors offer discounts or will set up payment plans for their patients who do not have insurance, but you will need to ask so you aren't surprised when you get to your appointment. ° °2) Contact Your Local Health Department °Not all health departments have doctors that can see patients for sick visits, but many do, so it is worth a call to see if yours does. If you don't know where your local health department is, you can check in your phone book. The CDC also has a tool to help you locate your state's health department, and many state websites also have listings of all of their local health departments. ° °3) Find a Walk-in Clinic °If your illness is not likely to be very severe or complicated, you may want to try a walk in clinic. These are popping up all over the country in pharmacies, drugstores, and shopping centers. They're usually staffed by nurse practitioners or physician assistants that have been trained to treat common illnesses and complaints. They're usually fairly quick and inexpensive. However, if you have serious medical issues or chronic medical problems, these are probably not your best option. ° °No Primary Care Doctor: °- Call [facility] Connect at  832-8000 - they can help you locate a primary care doctor that  accepts your insurance, provides certain services, etc. °- Physician Referral Service- 1-800-533-3463 ° °Chronic Pain Problems: °Organization         Address  Phone   Notes  °[facility]  (336) 297-2271 Patients need to be referred by their primary care doctor.  ° °Medication  Assistance: °Organization         Address  Phone   Notes  °[place] [facility] [address] °[place], [place] [address] (336) 641-8030 --Must be a resident of [place] °-- Must have NO insurance coverage whatsoever (no Medicaid/ Medicare, etc.) °-- The pt. MUST have a primary care doctor that directs their care regularly and follows them in the community °  °[facility]  (866) 331-1348   °[employer]  (888) 892-1162   ° °Agencies that provide inexpensive medical care: °Organization         Address  Phone   Notes  °[facility]  (336) 832-8035   °[facility]    (336) 832-7272   °[facility] [address] °[place],  [address] (336) 832-4777   °[facility] [address], °[place] (336) 271-4999   °[facility]    (336) 373-0678   °[facility]    (336) 272-1050   °[facility] ° [address], [place] Phone:  (336) 832-4444, Fax:  (336) 832-4440 Hours of Operation:  9 am - 6 pm, M-F.  Also accepts Medicaid/Medicare and self-pay.  °[facility] ° [address], [place] Phone: (336) 832-3150, Fax: (336) [phone]. Hours of Operation:  8:30 am - 5:30 pm, M-F.  Also accepts Medicaid and self-pay.  °[facility] [place] [address], [place] Phone: (336) 878-6027   °[facility] [address], [place], [place] (336)723-1848, Ext. 123 Mondays & Thursdays: 7-9 AM.  First 15 patients are seen on a first come, first serve basis. °  ° °Medicaid-accepting [place] Providers: ° °Organization         Address  Phone   Notes  °[facility] [address], [place] (336) 641-2100 Also accepts self-pay patients.  °[facility] [address], [place] ° (336) 856-9996   °[facility] [address], [place]  (336) 288-8857   °[facility] [address] [place] [address], [place] (336) 299-7000   °Veita Bland [address], [place]  ° (336) 373-1557 Only accepts [place] Access Medicaid patients after they have their name applied to their card.  ° °Self-Pay (no insurance) in [place]: ° °Organization         Address  Phone   Notes  °Sickle Cell Patients, [facility] [address], [place] (336) 832-1970   °[facility] [address], [place] (336) 832-4400   °[facility] ° [address], [place] (336) 992-4800   °[facility]/Dr. Osei-Bonsu ° [address] [place] [address], [place] or [address], [place] (336) 841-8500 Phone number for both [place] and [place] locations is the same.  °[facility] [address], [place] (336) 299-0000   °[facility] [address] [place] [address], [place] or [address] (336) 852-7530 °(336) 878-2260   °[facility] [address], [place] (336) 350-1642, phone; (336) 294-5005, fax Sees patients 1st and 3rd Saturday of every month.  Must not qualify for public or private insurance (i.e. Medicaid, Medicare, [place] Health Choice, Veterans' Benefits) • Household income should be no more than 200% of the poverty level •The clinic cannot treat you if you are pregnant or think you are pregnant • Sexually transmitted diseases are not treated at the clinic.  ° ° °Dental Care: °Organization         Address  Phone  Notes  °[place] [facility] [address], [place] (336) 641-6152 Accepts children up to age 21 who are enrolled in Medicaid or [place] Health Choice; pregnant women with a Medicaid card; and children who have applied for Medicaid or [place] Health Choice, but were declined, whose parents can pay a reduced fee at time of service.  °[place]  [facility] [place]  [address], [place] (336) 641-7733 Accepts children up to age 21 who are enrolled in Medicaid or [place] Health Choice; pregnant women with a Medicaid card; and children who have applied for Medicaid or [place] Health Choice, but were declined, whose parents can pay a reduced fee at time of service.  °[facility] ° [address], [place] (336) 641-4533 Patients are seen by appointment only. Walk-ins are not accepted. [facility] will see patients 18 years of age and older. °Monday - Tuesday (8am-5pm) °Most Wednesdays (8:30-5pm) °$30 per visit, cash only  °[facility] ° [address], [place] (336) 641-4533 Patients are seen by appointment only. Walk-ins are not accepted. [facility] will see patients 18 years of age and older. °One   Wednesday Evening (Monthly: Volunteer Based).  $30 per visit, cash only  °[facility]  (919) 537-3737 for adults; Children under age 4, call Graduate Pediatric Dentistry at (919) [phone]. Children aged 4-14, please call (919) [phone] to request a pediatric application. ° Dental services are provided in all areas of dental care including fillings, crowns and bridges, complete and partial dentures, implants, gum treatment, root canals, and extractions. Preventive care is also provided. Treatment is provided to both adults and children. °Patients are selected via a lottery and there is often a waiting list. °  °[facility] [address], °[place] ° (336) 763-8833 [email or web address] °  °[facility] [address], [place], [place] (336)723-1848, Ext. 123 Second and Fourth Thursday of each month, opens at 6:30 AM; Clinic ends at 9 AM.  Patients are seen on a first-come first-served basis, and a limited number are seen during each clinic.  ° °[facility] ° [address], [place], [place] (336) 723-7904    Eligibility Requirements °You must have lived in [place], [place], or [place] counties for at least the last three months. °  You cannot be eligible for state or federal sponsored healthcare insurance, including Veterans Administration, Medicaid, or Medicare. °  You generally cannot be eligible for healthcare insurance through your employer.  °  How to apply: °Eligibility screenings are held every Tuesday and Wednesday afternoon from 1:00 pm until 4:00 pm. You do not need an appointment for the interview!  °[facility] [address], [place], [place] 336-631-2330   °[facility]  336-342-8273   °[facility]  336-703-3100   °[facility]  336-570-6415   ° °Behavioral Health Resources in the Community: °Intensive Outpatient Programs °Organization         Address  Phone  Notes  °[facility] [address], [place], [place] 336-878-6098   °[facility] [address], [place], [place] 336-832-9800   °[facility] [address], [place], [place] ° 336-882-2125   °[facility] [address],  °[place], [place] 1-800-853-5163 or 336-641-4981   °Substance Abuse Resources °Organization         Address  Phone  Notes  °[facility]  336-882-2125   °[facility]  336-784-9470   °[facility]  336-285-9073   °[facility]  336-845-3988   °Residential & Outpatient Substance Abuse Program  1-800-659-3381   °Psychological Services °Organization         Address  Phone  Notes  °[facility]  336- 832-9600   °[facility]  336- 378-7881   °[facility] [address], [place] 1-800-853-5163 or 336-641-4981   ° °Mobile Crisis Teams °Organization         Address  Phone  Notes  °[facility], Mobile Crisis Care Unit  1-877-626-1772   °Assertive °[facility] ° [address]  [place], [place] 336-834-9664   °Sharon DeEsch [address] °[place] 336-554-5454   ° °Self-Help/Support Groups °Organization         Address  Phone             Notes  °[facility] - variety of support groups  336- 373-1402 Call for more information  °Narcotics Anonymous (NA), [facility] [address], °[place]  2 meetings at this location  ° °  Residential Treatment Programs °Organization         Address  Phone  Notes  °[facility] [address],    °[place]  1-866-801-8205   °[facility] ° [address], [place], [place] 704-293-8524   °[facility] [address], [place] 336-845-3988 Admissions: 8am-3pm M-F  °[facility] [address]    °[place], [place] 336-841-1104   °[facility] [address], [place], [place] 336-379-7146   °[facility] [address]  °[place], [place] 336-285-9073   °[facility] - Intensive Outpatient [address], [place], [place] 336-852-3033   °[facility] [address]  °[place], [place] 1-877-615-2722 or 336-784-9470   °[facility] [address] [place], [place] 336-227-7417 Accepts Medicaid  °[facility] [address]  °[place] 1-800-659-3381 Substance Abuse/Addiction Treatment  ° °[place] Behavioral Health Resources °Organization         Address  Phone  Notes  °[facility]  (888) 581-9988   °Julie Brannon, PhD [address] [place], [place]   (336) 349-5553 or (336) 951-0000   °[facility]   [address] °[place], [place] (336) 349-4454   °[facility] [address], [place], [place] (336) 342-8316 Insurance/Medicaid/sponsorship through [facility]  °[facility] [address]                                    [place], [place] (336) 342-8316 Therapy/tele-psych/case    °[facility] [address]  ° [place], [place] (336) 349-2233    °Dr. Arfeen  (336) 349-4544   °[facility] [place]  [employer] [place] [facility] 1) [address], [place] °2) [address], [place] °3)  [address], [place] (336) 349-3220 °(336) 342-7768 ° °(336) 342-8140   °[place] [facility] (336) 342-1394 or (336) 342-3537 (After Hours)    ° ° °

## 2014-01-23 NOTE — ED Provider Notes (Signed)
CSN: [id number]     Arrival date & time 01/22/14  2338 History   None    Chief Complaint  Patient presents with  . detox      (Consider location/radiation/quality/duration/timing/severity/associated sxs/prior Treatment) HPI Comments: Patient is a 43 year old male past medical history significant for alcohol abuse, depression, anxiety presented to the emergency department requesting detox from alcohol. His last use was this evening. He states that he normally drinks 1+ case of beer a day but today the only one to 240 ounce beers as he was worried he might go into withdrawal without them. Patient states that he felt some jitteriness and nausea. He states he's been to the emergency department for symptoms like this before but has never been hospitalized for alcohol withdrawal seizures. Patient states he has attempted to go to rehabilitation in the past and relapsed. He denies any suicidal or homicidal ideations. He denies any recreational drug use. He denies any self injury. No other physical complaints at this time.   Past Medical History  Diagnosis Date  . Alcohol abuse   . Depression   . Anxiety    Past Surgical History  Procedure Laterality Date  . Tonsillectomy     History reviewed. No pertinent family history. History  Substance Use Topics  . Smoking status: Current Every Day Smoker -- 1.00 packs/day    Types: Cigarettes  . Smokeless tobacco: Never Used  . Alcohol Use: 0.6 oz/week    1 Cans of beer per week     Comment: a case a day; pt told this writer 12 pk daily or a fifth of vodka    Review of Systems  Gastrointestinal: Positive for nausea. Negative for vomiting and abdominal pain.  Neurological: Positive for tremors.  All other systems reviewed and are negative.      Allergies  Shellfish allergy  Home Medications   Current Outpatient Rx  Name  Route  Sig  Dispense  Refill  . gabapentin (NEURONTIN) 100 MG capsule   Oral   Take 1 capsule (100 mg total) by  mouth 3 (three) times daily. For substance withdrawal syndrome   90 capsule   0   . traZODone (DESYREL) 50 MG tablet   Oral   Take 1 tablet (50 mg total) by mouth at bedtime as needed for sleep.   60 tablet   0    BP 104/60  Pulse 90  Temp(Src) 98 F (36.7 C) (Oral)  Resp 20  Ht 6\' 2"  (1.88 m)  Wt 164 lb 2 oz (74.447 kg)  BMI 21.06 kg/m2  SpO2 99% Physical Exam  Nursing note and vitals reviewed. Constitutional: He is oriented to person, place, and time. He appears well-developed and well-nourished. No distress.  HENT:  Head: Normocephalic and atraumatic.  Right Ear: External ear normal.  Left Ear: External ear normal.  Nose: Nose normal.  Mouth/Throat: Oropharynx is clear and moist.  Eyes: Conjunctivae are normal.  Neck: Normal range of motion. Neck supple.  Cardiovascular: Normal rate, regular rhythm and normal heart sounds.   Pulmonary/Chest: Effort normal and breath sounds normal. No respiratory distress.  Abdominal: Soft. Bowel sounds are normal. He exhibits no distension. There is no tenderness. There is no rebound and no guarding.  Musculoskeletal: Normal range of motion.  Neurological: He is alert and oriented to person, place, and time.  Skin: Skin is warm and dry. He is not diaphoretic.  Psychiatric: He has a normal mood and affect.    ED Course  Procedures (including  critical care time) Medications  LORazepam (ATIVAN) injection 0-4 mg (0 mg Intravenous Not Given 01/23/14 0310)    Followed by  LORazepam (ATIVAN) injection 0-4 mg (not administered)  thiamine (VITAMIN B-1) tablet 100 mg (not administered)    Or  thiamine (B-1) injection 100 mg (not administered)  acetaminophen (TYLENOL) tablet 650 mg (not administered)  ibuprofen (ADVIL,MOTRIN) tablet 600 mg (not administered)  nicotine (NICODERM CQ - dosed in mg/24 hours) patch 21 mg (not administered)  zolpidem (AMBIEN) tablet 5 mg (not administered)  ondansetron (ZOFRAN) tablet 4 mg (not administered)   alum & mag hydroxide-simeth (MAALOX/MYLANTA) 200-200-20 MG/5ML suspension 30 mL (not administered)  LORazepam (ATIVAN) tablet 1 mg (1 mg Oral Given 01/23/14 1610)  gabapentin (NEURONTIN) capsule 100 mg (not administered)  traZODone (DESYREL) tablet 50 mg (not administered)    Labs Review Labs Reviewed  ETHANOL - Abnormal; Notable for the following:    Alcohol, Ethyl (B) 279 (*)    All other components within normal limits  URINE RAPID DRUG SCREEN (HOSP PERFORMED)  CBC  COMPREHENSIVE METABOLIC PANEL   Imaging Review No results found.   EKG Interpretation None      MDM   Final diagnoses:  Alcohol dependence    Filed Vitals:   01/23/14 0627  BP: 104/60  Pulse: 90  Temp:   Resp:     Afebrile, NAD, non-toxic appearing, AAOx4.   Patient requesting placement for detox from alcohol. Long-standing history of abuse. No SI, HI, hallucinations, self injury. No recreational drug use. Patient endorses some tremors and nausea no other physical complaints at this time. Labs reviewed. TTS consulted and patient fits inpatient treatment criteria, currently are working on bed placement for patient at this time. Will notify once patient has been accepted to a facility.    Jeannetta Ellis, PA-C 01/23/14 [phone]

## 2014-01-23 NOTE — BH Assessment (Signed)
Received a call for a tele-assessment. Spoke with Francee PiccoloJennifer Piepenbrink, PA-C who stated that pt is a long standing alcoholic and has had multiple relapses. Pt denies SI/HI or hallucinations at this time. Pt denied having any hospitalizations or withdrawal seizures from alcohol. Tele-assessment will be initiated.

## 2014-01-23 NOTE — ED Notes (Signed)
All patient personal belongings were taken to holding area, all pt valuables were locked up in security

## 2014-01-23 NOTE — ED Notes (Signed)
Called rts to check on referral. Spoke to alaina and she advises she has not received a referral on this pt. Will send referral now

## 2014-01-23 NOTE — Progress Notes (Signed)
Placed call to [facility] to see if beds available for a male, stated that someone has already called and was sending information on this pt.   Tomi BambergerMariya Doyl Bitting Disposition MHT

## 2014-01-23 NOTE — ED Provider Notes (Signed)
Medical screening examination/treatment/procedure(s) were conducted as a shared visit with non-physician practitioner(s) and myself.  I personally evaluated the patient during the encounter.   EKG Interpretation None      Pt feels much better at this time and would like to go home. No signs of withdrawal   Lyanne CoKevin M Aniqua Briere, MD 01/23/14 [phone]

## 2014-01-24 ENCOUNTER — Encounter (HOSPITAL_COMMUNITY): Payer: Self-pay | Admitting: Emergency Medicine

## 2014-01-24 ENCOUNTER — Emergency Department (HOSPITAL_COMMUNITY)
Admission: EM | Admit: 2014-01-24 | Discharge: 2014-01-25 | Disposition: A | Discharge status: Home / Self Care | Payer: Self-pay | Attending: Emergency Medicine | Admitting: Emergency Medicine

## 2014-01-24 DIAGNOSIS — F1020 Alcohol dependence, uncomplicated: Secondary | ICD-10-CM

## 2014-01-24 DIAGNOSIS — F3289 Other specified depressive episodes: Secondary | ICD-10-CM | POA: Insufficient documentation

## 2014-01-24 DIAGNOSIS — F10229 Alcohol dependence with intoxication, unspecified: Secondary | ICD-10-CM | POA: Insufficient documentation

## 2014-01-24 DIAGNOSIS — R000 Tachycardia, unspecified: Secondary | ICD-10-CM | POA: Insufficient documentation

## 2014-01-24 DIAGNOSIS — F411 Generalized anxiety disorder: Secondary | ICD-10-CM | POA: Insufficient documentation

## 2014-01-24 DIAGNOSIS — Z79899 Other long term (current) drug therapy: Secondary | ICD-10-CM | POA: Insufficient documentation

## 2014-01-24 DIAGNOSIS — F17200 Nicotine dependence, unspecified, uncomplicated: Secondary | ICD-10-CM | POA: Insufficient documentation

## 2014-01-24 DIAGNOSIS — F329 Major depressive disorder, single episode, unspecified: Secondary | ICD-10-CM | POA: Insufficient documentation

## 2014-01-24 LAB — RAPID URINE DRUG SCREEN, HOSP PERFORMED
AMPHETAMINES: NOT DETECTED
BENZODIAZEPINES: NOT DETECTED
Barbiturates: NOT DETECTED
Cocaine: NOT DETECTED
OPIATES: NOT DETECTED
Tetrahydrocannabinol: NOT DETECTED

## 2014-01-24 LAB — COMPREHENSIVE METABOLIC PANEL
ALK PHOS: 61 U/L (ref 39–117)
ALT: 24 U/L (ref 0–53)
AST: 35 U/L (ref 0–37)
Albumin: 4 g/dL (ref 3.5–5.2)
BUN: 7 mg/dL (ref 6–23)
CALCIUM: 9.1 mg/dL (ref 8.4–10.5)
CO2: 22 mEq/L (ref 19–32)
Chloride: 98 mEq/L (ref 96–112)
Creatinine, Ser: 0.89 mg/dL (ref 0.50–1.35)
GFR calc Af Amer: >90 mL/min (ref >90)
GFR calc non Af Amer: >90 mL/min (ref >90)
Glucose, Bld: 111 mg/dL — ABNORMAL HIGH (ref 70–99)
Potassium: 4 mEq/L (ref 3.7–5.3)
SODIUM: 137 meq/L (ref 137–147)
TOTAL PROTEIN: 7.8 g/dL (ref 6.0–8.3)
Total Bilirubin: 0.5 mg/dL (ref 0.3–1.2)

## 2014-01-24 LAB — CBC WITH DIFFERENTIAL/PLATELET
BASOS PCT: 1 % (ref 0–1)
Basophils Absolute: 0 10*3/uL (ref 0.0–0.1)
EOS ABS: 0.1 10*3/uL (ref 0.0–0.7)
Eosinophils Relative: 2 % (ref 0–5)
HCT: 40.7 % (ref 39.0–52.0)
Hemoglobin: 14.1 g/dL (ref 13.0–17.0)
Lymphocytes Relative: 42 % (ref 12–46)
Lymphs Abs: 2.5 10*3/uL (ref 0.7–4.0)
MCH: 27.7 pg (ref 26.0–34.0)
MCHC: 34.6 g/dL (ref 30.0–36.0)
MCV: 80 fL (ref 78.0–100.0)
Monocytes Absolute: 0.4 10*3/uL (ref 0.1–1.0)
Monocytes Relative: 7 % (ref 3–12)
NEUTROS PCT: 48 % (ref 43–77)
Neutro Abs: 2.9 10*3/uL (ref 1.7–7.7)
PLATELETS: 334 10*3/uL (ref 150–400)
RBC: 5.09 MIL/uL (ref 4.22–5.81)
RDW: 14.9 % (ref 11.5–15.5)
WBC: 6 10*3/uL (ref 4.0–10.5)

## 2014-01-24 LAB — ETHANOL: Alcohol, Ethyl (B): 279 mg/dL — ABNORMAL HIGH (ref 0–11)

## 2014-01-24 MED ORDER — ALUM & MAG HYDROXIDE-SIMETH 200-200-20 MG/5ML PO SUSP
30.0000 mL | ORAL | Status: DC | PRN
Start: 2014-01-24 — End: 2014-01-25

## 2014-01-24 MED ORDER — LORAZEPAM 1 MG PO TABS: 0.0000 mg | ORAL_TABLET | Freq: Two times a day (BID) | ORAL | Status: DC

## 2014-01-24 MED ORDER — NICOTINE 21 MG/24HR TD PT24: 21.0000 mg | MEDICATED_PATCH | Freq: Every day | TRANSDERMAL | Status: DC

## 2014-01-24 MED ORDER — LORAZEPAM 1 MG PO TABS
1.0000 mg | ORAL_TABLET | Freq: Three times a day (TID) | ORAL | Status: DC | PRN
  Administered 2014-01-25: 1 mg via ORAL
  Filled 2014-01-24: qty 1

## 2014-01-24 MED ORDER — GABAPENTIN 100 MG PO CAPS
100.0000 mg | ORAL_CAPSULE | Freq: Three times a day (TID) | ORAL | Status: DC
  Administered 2014-01-24 – 2014-01-25 (×2): 100 mg via ORAL
  Filled 2014-01-24 (×4): qty 1

## 2014-01-24 MED ORDER — ONDANSETRON HCL 4 MG PO TABS: 4.0000 mg | ORAL_TABLET | Freq: Three times a day (TID) | ORAL | Status: DC | PRN

## 2014-01-24 MED ORDER — TRAZODONE HCL 50 MG PO TABS: 50.0000 mg | ORAL_TABLET | Freq: Every evening | ORAL | Status: DC | PRN

## 2014-01-24 MED ORDER — LORAZEPAM 1 MG PO TABS: 0.0000 mg | ORAL_TABLET | Freq: Four times a day (QID) | ORAL | Status: DC

## 2014-01-24 NOTE — ED Notes (Signed)
Pt. requesting detox for alcohol abuse , last drink today , denies suicidal ideation , no visual or auditory hallucinations .

## 2014-01-24 NOTE — ED Provider Notes (Signed)
CSN: [id number]     Arrival date & time 01/24/14  2051 History   First MD Initiated Contact with Patient 01/24/14 2139     Chief Complaint  Patient presents with  . Alcohol Problem     (Consider location/radiation/quality/duration/timing/severity/associated sxs/prior Treatment) Patient is a 43 y.o. male presenting with alcohol problem. The history is provided by the patient.  Alcohol Problem   patient here requesting help with detox from alcohol as well as worsening depression. No suicidal homicidal ideations. Denies any auditory hallucinations. Was seen 2 days ago for similar symptoms and left before being placed into a facility. Was admitted to behavior health hospital week ago for similar symptoms as well 2. States that his depression has been worsening and that he's been compliant with his medications. Last drink was just prior to arrival. Denies any illicit drug use currently. Nothing makes her symptoms better worse. No treatment tried prior to arrival.  Past Medical History  Diagnosis Date  . Alcohol abuse   . Depression   . Anxiety    Past Surgical History  Procedure Laterality Date  . Tonsillectomy     No family history on file. History  Substance Use Topics  . Smoking status: Current Every Day Smoker -- 1.00 packs/day    Types: Cigarettes  . Smokeless tobacco: Never Used  . Alcohol Use: 0.6 oz/week    1 Cans of beer per week     Comment: a case a day; pt told this writer 12 pk daily or a fifth of vodka    Review of Systems  All other systems reviewed and are negative.      Allergies  Shellfish allergy  Home Medications   Current Outpatient Rx  Name  Route  Sig  Dispense  Refill  . gabapentin (NEURONTIN) 100 MG capsule   Oral   Take 1 capsule (100 mg total) by mouth 3 (three) times daily. For substance withdrawal syndrome   90 capsule   0   . traZODone (DESYREL) 50 MG tablet   Oral   Take 1 tablet (50 mg total) by mouth at bedtime as needed for  sleep.   60 tablet   0    BP 139/90  Pulse 96  Temp(Src) 98.4 F (36.9 C) (Oral)  Resp 20  Ht 6\' 2"  (1.88 m)  Wt 160 lb (72.576 kg)  BMI 20.53 kg/m2  SpO2 98% Physical Exam  Nursing note and vitals reviewed. Constitutional: He is oriented to person, place, and time. He appears well-developed and well-nourished.  Non-toxic appearance. No distress.  HENT:  Head: Normocephalic and atraumatic.  Eyes: Conjunctivae, EOM and lids are normal. Pupils are equal, round, and reactive to light.  Neck: Normal range of motion. Neck supple. No tracheal deviation present. No mass present.  Cardiovascular: Regular rhythm and normal heart sounds.  Tachycardia present.  Exam reveals no gallop.   No murmur heard. Pulmonary/Chest: Effort normal and breath sounds normal. No stridor. No respiratory distress. He has no decreased breath sounds. He has no wheezes. He has no rhonchi. He has no rales.  Abdominal: Soft. Normal appearance and bowel sounds are normal. He exhibits no distension. There is no tenderness. There is no rebound and no CVA tenderness.  Musculoskeletal: Normal range of motion. He exhibits no edema and no tenderness.  Neurological: He is alert and oriented to person, place, and time. He has normal strength. No cranial nerve deficit or sensory deficit. GCS eye subscore is 4. GCS verbal subscore is 5. GCS  motor subscore is 6.  Skin: Skin is warm and dry. No abrasion and no rash noted.  Psychiatric: He has a normal mood and affect. His speech is normal and behavior is normal. He expresses no suicidal plans and no homicidal plans.    ED Course  Procedures (including critical care time) Labs Review Labs Reviewed  CBC WITH DIFFERENTIAL  ETHANOL  URINE RAPID DRUG SCREEN (HOSP PERFORMED)  COMPREHENSIVE METABOLIC PANEL   Imaging Review No results found.   EKG Interpretation None      MDM   Final diagnoses:  None    DT patient's concurrent history of depression, we'll consult TTS  for placement.    Toy Baker, MD 01/24/14 [phone]

## 2014-01-24 NOTE — Progress Notes (Signed)
Patient Discharge Instructions:  After Visit Summary (AVS):   Faxed to:  01/24/14 Discharge Summary Note:   Faxed to:  01/24/14 Psychiatric Admission Assessment Note:   Faxed to:  01/24/14 Suicide Risk Assessment - Discharge Assessment:   Faxed to:  01/24/14 Faxed/Sent to the Next Level Care provider:  01/24/14 [facility] @ [phone]  Jerelene ReddenSheena E [place], 01/24/2014, 4:01 PM

## 2014-01-24 NOTE — BH Assessment (Signed)
[facility] Assessment Progress Note  At 22:32 I spoke to EDP Lorre NickAnthony Allen, MD in anticipation of TTS assessment.  He reports that was in the ED seeking help with alcohol problems.  He left the ED, but has returned for the same problems.  Doylene Canninghomas Lynsi Dooner, MA Triage Specialist 01/24/2014 @ 23:29

## 2014-01-25 MED ORDER — GABAPENTIN 100 MG PO CAPS: 100.0000 mg | ORAL_CAPSULE | Freq: Three times a day (TID) | ORAL | Status: DC

## 2014-01-25 NOTE — ED Notes (Signed)
TSS taking place now.

## 2014-01-25 NOTE — BH Assessment (Signed)
Tele Assessment Note   Chad Sanchez is an 43 y.o. male.  -Clinician talked to Dr. Norlene Campbelltter about need for TTS.  She said that she understood that patient is requesting detox from ETOH.  Patient was assessed on 03/22 in the early morning.  Patient says that he wishes he had not left because he had to start drinking again.  He says, "I thought I was feeling better but when the medicine wore off, I felt like crap."  Patient denies any SI, HI or A/V hallucinations.  Patient admits to being hospitalized multiple times for detox.  He cites a long history of problems with substance abuse stemming all the way to birth.  Patient reports "My mother was intoxicated when I was born."  Pt reports drinking a case of beer daily since 2011.  He is experiencing withdrawal symptoms such as "the shakes, tremors and nausea.  Patient had remained sober for 4 years in the past but due to life stressors, he did not utilize healthy coping skills.    Patient now reports he has to drink to keep from being sick.  Drinks to gain appetite and get a good night's rest.  Patient has no transportation to get to [facility] or [facility].  Clinician did talk to Dr. Norlene Campbelltter and let her know that those two places will be referral points for patient.  Axis I: Substance Induced Mood Disorder and 303.90 ETOH use d/o, severe Axis II: Deferred Axis III:  Past Medical History  Diagnosis Date  . Alcohol abuse   . Depression   . Anxiety    Axis IV: economic problems, occupational problems and problems related to social environment Axis V: 31-40 impairment in reality testing  Past Medical History:  Past Medical History  Diagnosis Date  . Alcohol abuse   . Depression   . Anxiety     Past Surgical History  Procedure Laterality Date  . Tonsillectomy      Family History: No family history on file.  Social History:  reports that he has been smoking Cigarettes.  He has been smoking about 1.00 pack per day. He has never used smokeless  tobacco. He reports that he drinks about 0.6 ounces of alcohol per week. He reports that he uses illicit drugs ("Crack" cocaine and Cocaine).  Additional Social History:  Alcohol / Drug Use Pain Medications: None Prescriptions: None Over the Counter: None History of alcohol / drug use?: Yes Negative Consequences of Use: Financial Withdrawal Symptoms: Diarrhea;Fever / Chills;Tremors;Irritability;Nausea / Vomiting;Patient aware of relationship between substance abuse and physical/medical complications;Sweats Substance #1 Name of Substance 1: ETOH 1 - Age of First Use: 43 years of age 11 - Amount (size/oz): 1 case of beer daily 1 - Frequency: Daily use 1 - Duration: Ince 2011 1 - Last Use / Amount: Drank two 40's around 7pm tonight (03/23). Substance #2 Name of Substance 2: Cocaine 2 - Age of First Use: 43 years of age 82 - Amount (size/oz): <1 gram 2 - Frequency: Twice per week on average 82 - Duration: Ongoing 2 - Last Use / Amount: 1 week ago less than one gram.  CIWA: CIWA-Ar BP: 108/71 mmHg Pulse Rate: 92 Nausea and Vomiting: no nausea and no vomiting Tactile Disturbances: none Tremor: no tremor Auditory Disturbances: not present Paroxysmal Sweats: no sweat visible Visual Disturbances: not present Anxiety: no anxiety, at ease Headache, Fullness in Head: none present Agitation: normal activity Orientation and Clouding of Sensorium: oriented and can do serial additions CIWA-Ar Total: 0  COWS:    Allergies:  Allergies  Allergen Reactions  . Shellfish Allergy Anaphylaxis    Home Medications:  (Not in a hospital admission)  OB/GYN Status:  No LMP for male patient.  General Assessment Data Location of Assessment: [facility] ED Is this a Tele or Face-to-Face Assessment?: Tele Assessment Is this an Initial Assessment or a Re-assessment for this encounter?: Initial Assessment Living Arrangements: Non-relatives/Friends Can pt return to current living arrangement?: Yes Admission  Status: Voluntary Is patient capable of signing voluntary admission?: Yes Transfer from: Acute Hospital Referral Source: Self/Family/Friend     [facility] Crisis Care Plan Living Arrangements: Non-relatives/Friends Name of Psychiatrist: None Name of Therapist: NOne     Risk to self Suicidal Ideation: No Suicidal Intent: No Is patient at risk for suicide?: No Suicidal Plan?: No Access to Means: No What has been your use of drugs/alcohol within the last 12 months?: Daily Previous Attempts/Gestures: Yes How many times?: 1 Other Self Harm Risks: None Triggers for Past Attempts: Unknown Intentional Self Injurious Behavior: None Family Suicide History: Unknown Recent stressful life event(s): Financial Problems Persecutory voices/beliefs?: No Depression: Yes Depression Symptoms: Despondent;Insomnia;Isolating;Guilt;Feeling worthless/self pity Substance abuse history and/or treatment for substance abuse?: Yes Suicide prevention information given to non-admitted patients: Not applicable  Risk to Others Homicidal Ideation: No Thoughts of Harm to Others: No Current Homicidal Intent: No Current Homicidal Plan: No Access to Homicidal Means: No Identified Victim: No one History of harm to others?: No Assessment of Violence: None Noted Violent Behavior Description: N/A Does patient have access to weapons?: No Criminal Charges Pending?: No Does patient have a court date: No  Psychosis Hallucinations: None noted Delusions: None noted  Mental Status Report Appear/Hygiene:  (Casual) Eye Contact: Fair Motor Activity: Freedom of movement;Unremarkable Speech: Logical/coherent Level of Consciousness: Quiet/awake Mood: Depressed Affect: Depressed Anxiety Level: Minimal Thought Processes: Coherent;Relevant Judgement: Impaired Orientation: Place;Person;Time;Situation Obsessive Compulsive Thoughts/Behaviors: None  Cognitive Functioning Concentration: Normal Memory: Recent  Intact;Remote Impaired IQ: Average Insight: Fair Impulse Control: Poor Appetite: Good Weight Loss: 0 Weight Gain: 0 Sleep: Decreased Total Hours of Sleep: 5 Vegetative Symptoms: None  ADLScreening [facility]) Patient's cognitive ability adequate to safely complete daily activities?: Yes Patient able to express need for assistance with ADLs?: Yes Independently performs ADLs?: Yes (appropriate for developmental age)  Prior Inpatient Therapy Prior Inpatient Therapy: Yes Prior Therapy Dates: 2014, 2015 Prior Therapy Facilty/Provider(s): [facility], [facility] Reason for Treatment: Detox   Prior Outpatient Therapy Prior Outpatient Therapy: No Prior Therapy Dates: None  Prior Therapy Facilty/Provider(s): None  Reason for Treatment: None   ADL Screening (condition at time of admission) Patient's cognitive ability adequate to safely complete daily activities?: Yes Is the patient deaf or have difficulty hearing?: No Does the patient have difficulty seeing, even when wearing glasses/contacts?: No Does the patient have difficulty concentrating, remembering, or making decisions?: No Patient able to express need for assistance with ADLs?: Yes Does the patient have difficulty dressing or bathing?: No Independently performs ADLs?: Yes (appropriate for developmental age) Does the patient have difficulty walking or climbing stairs?: No Weakness of Legs: None Weakness of Arms/Hands: None       Abuse/Neglect Assessment (Assessment to be complete while patient is alone) Physical Abuse: Denies Verbal Abuse: Denies Sexual Abuse: Denies Exploitation of patient/patient's resources: Denies Self-Neglect: Denies     Merchant navy officer (For Healthcare) Advance Directive: Patient does not have advance directive;Patient would not like information    Additional Information 1:1 In Past 12 Months?: No CIRT Risk: No Elopement  Risk: No Does patient have medical clearance?: Yes      Disposition:  Disposition Initial Assessment Completed for this Encounter: Yes Disposition of Patient: Inpatient treatment program Type of inpatient treatment program: Adult Patient referred to: [facility]  Beatriz Stallion Ray 01/25/2014 5:23 AM

## 2014-01-25 NOTE — ED Notes (Signed)
[facility] to transport pt

## 2014-01-25 NOTE — ED Notes (Signed)
PATIENT HAS BEEN ACCEPTED TO [facility] PER CAR[facility].

## 2014-01-25 NOTE — ED Notes (Signed)
REFERRAL FAXED TO [facility]. THEY HAVE OPEN DETOX BEDS 

## 2014-01-25 NOTE — ED Notes (Signed)
Spoke with [facility]. They advise that they have not gotten a referral for this patient

## 2014-01-29 ENCOUNTER — Emergency Department (HOSPITAL_COMMUNITY)
Admission: EM | Admit: 2014-01-29 | Discharge: 2014-01-29 | Disposition: A | Discharge status: Home / Self Care | Payer: Self-pay | Attending: Emergency Medicine | Admitting: Emergency Medicine

## 2014-01-29 ENCOUNTER — Emergency Department (HOSPITAL_COMMUNITY): Payer: Self-pay

## 2014-01-29 ENCOUNTER — Encounter (HOSPITAL_COMMUNITY): Payer: Self-pay | Admitting: Emergency Medicine

## 2014-01-29 DIAGNOSIS — IMO0002 Reserved for concepts with insufficient information to code with codable children: Secondary | ICD-10-CM | POA: Insufficient documentation

## 2014-01-29 DIAGNOSIS — M79672 Pain in left foot: Secondary | ICD-10-CM

## 2014-01-29 DIAGNOSIS — F411 Generalized anxiety disorder: Secondary | ICD-10-CM | POA: Insufficient documentation

## 2014-01-29 DIAGNOSIS — S99929A Unspecified injury of unspecified foot, initial encounter: Principal | ICD-10-CM

## 2014-01-29 DIAGNOSIS — F329 Major depressive disorder, single episode, unspecified: Secondary | ICD-10-CM | POA: Insufficient documentation

## 2014-01-29 DIAGNOSIS — F3289 Other specified depressive episodes: Secondary | ICD-10-CM | POA: Insufficient documentation

## 2014-01-29 DIAGNOSIS — F1010 Alcohol abuse, uncomplicated: Secondary | ICD-10-CM | POA: Insufficient documentation

## 2014-01-29 DIAGNOSIS — F1020 Alcohol dependence, uncomplicated: Secondary | ICD-10-CM

## 2014-01-29 DIAGNOSIS — S99919A Unspecified injury of unspecified ankle, initial encounter: Principal | ICD-10-CM

## 2014-01-29 DIAGNOSIS — S8990XA Unspecified injury of unspecified lower leg, initial encounter: Secondary | ICD-10-CM | POA: Insufficient documentation

## 2014-01-29 DIAGNOSIS — F17200 Nicotine dependence, unspecified, uncomplicated: Secondary | ICD-10-CM | POA: Insufficient documentation

## 2014-01-29 DIAGNOSIS — Y939 Activity, unspecified: Secondary | ICD-10-CM | POA: Insufficient documentation

## 2014-01-29 DIAGNOSIS — Y929 Unspecified place or not applicable: Secondary | ICD-10-CM | POA: Insufficient documentation

## 2014-01-29 DIAGNOSIS — Z79899 Other long term (current) drug therapy: Secondary | ICD-10-CM | POA: Insufficient documentation

## 2014-01-29 MED ORDER — LORAZEPAM 1 MG PO TABS
1.0000 mg | ORAL_TABLET | Freq: Once | ORAL | Status: AC
  Administered 2014-01-29: 1 mg via ORAL
  Filled 2014-01-29: qty 1

## 2014-01-29 NOTE — ED Notes (Signed)
Patient transported to X-ray 

## 2014-01-29 NOTE — ED Provider Notes (Signed)
CSN: [id number]     Arrival date & time 01/29/14  0038 History   First MD Initiated Contact with Patient 01/29/14 0209     Chief Complaint  Patient presents with  . Depression     (Consider location/radiation/quality/duration/timing/severity/associated sxs/prior Treatment) The history is provided by the patient.   patient ports left foot pain over the past several days.  He states that he stubbed his toe several days ago his had ongoing pain in his left medial midfoot.  He continues to build ambulate.  He does report ongoing alcohol abuse.  He states his last trip was approximately 4-5 hours ago and he feels slightly shaky at this time.  He does not requests assistance with detox.  He does report that he is somewhat depressed.  He denies suicidal or homicidal thoughts.  Past Medical History  Diagnosis Date  . Alcohol abuse   . Depression   . Anxiety    Past Surgical History  Procedure Laterality Date  . Tonsillectomy     No family history on file. History  Substance Use Topics  . Smoking status: Current Every Day Smoker -- 1.00 packs/day    Types: Cigarettes  . Smokeless tobacco: Never Used  . Alcohol Use: 0.6 oz/week    1 Cans of beer per week     Comment: a case a day; pt told this writer 12 pk daily or a fifth of vodka    Review of Systems  All other systems reviewed and are negative.      Allergies  Shellfish allergy  Home Medications   Current Outpatient Rx  Name  Route  Sig  Dispense  Refill  . acetaminophen (TYLENOL) 500 MG tablet   Oral   Take 500 mg by mouth every 6 (six) hours as needed for moderate pain.         Marland Kitchen gabapentin (NEURONTIN) 100 MG capsule   Oral   Take 1 capsule (100 mg total) by mouth 3 (three) times daily.   15 capsule   0   . traZODone (DESYREL) 50 MG tablet   Oral   Take 1 tablet (50 mg total) by mouth at bedtime as needed for sleep.   60 tablet   0    BP 122/80  Pulse 99  Temp(Src) 97.7 F (36.5 C)  SpO2 99% Physical  Exam  Nursing note and vitals reviewed. Constitutional: He is oriented to person, place, and time. He appears well-developed and well-nourished.  HENT:  Head: Normocephalic and atraumatic.  Eyes: EOM are normal.  Neck: Normal range of motion.  Cardiovascular: Normal rate, regular rhythm, normal heart sounds and intact distal pulses.   Pulmonary/Chest: Effort normal and breath sounds normal. No respiratory distress.  Abdominal: Soft. He exhibits no distension. There is no tenderness.  Musculoskeletal: Normal range of motion.  Mild tenderness of his left medial mid foot without obvious deformity.  Normal PT and DP pulse in his left foot.  No left lower extremity swelling  Neurological: He is alert and oriented to person, place, and time.  Skin: Skin is warm and dry.  Psychiatric: He has a normal mood and affect. Judgment normal.    ED Course  Procedures (including critical care time) Labs Review Labs Reviewed - No data to display Imaging Review Dg Foot Complete Left  01/29/2014   CLINICAL DATA:  Depression. Diffuse pain after stubbed toe 2 weeks ago.  EXAM: LEFT FOOT - COMPLETE 3+ VIEW  COMPARISON:  None.  FINDINGS: There is no  evidence of fracture or dislocation. Mild dorsal osseous spurring at the talonavicular joint. Incidental bipartite medial great toe sesamoid. Punctate high-density in the region of the second/third interspace is likely on the skin surface.  IMPRESSION: Negative.   Electronically Signed   By: Tiburcio PeaJonathan  Watts M.D.   On: 01/29/2014 03:53     EKG Interpretation None      MDM   Final diagnoses:  Left foot pain  Alcohol dependence    X-ray negative.  Normal pulses in left foot.  Outpatient resources given for the patient's alcohol abuse issue.  No signs of alcohol withdrawal this time    Lyanne CoKevin M Trenden Hazelrigg, MD 01/29/14 [phone]

## 2014-01-29 NOTE — ED Notes (Signed)
Patient is alert and oriented x3.  He is complaining of depression.  He states that he has  Been trying to stop drinking and today he had some beer to take the shakes away and  The depression is getting the best of him.  He also has complaints of left side pain.  Currently He rates his pain 10 of 10 in his left foot and left hand

## 2014-01-29 NOTE — ED Notes (Signed)
Pt states he wants detox from alcohol,  Drinks a case a beer a day and has been drinking since he was 43 years old,  Pt also says his left foot hurts,  It is red and peeling

## 2015-12-16 ENCOUNTER — Emergency Department (HOSPITAL_COMMUNITY)
Admission: EM | Admit: 2015-12-16 | Discharge: 2015-12-16 | Disposition: A | Discharge status: Home / Self Care | Payer: Self-pay | Attending: Emergency Medicine | Admitting: Emergency Medicine

## 2015-12-16 ENCOUNTER — Encounter (HOSPITAL_COMMUNITY): Payer: Self-pay | Admitting: *Deleted

## 2015-12-16 ENCOUNTER — Emergency Department (HOSPITAL_COMMUNITY): Payer: Self-pay

## 2015-12-16 DIAGNOSIS — F1094 Alcohol use, unspecified with alcohol-induced mood disorder: Secondary | ICD-10-CM | POA: Diagnosis present

## 2015-12-16 DIAGNOSIS — R45851 Suicidal ideations: Secondary | ICD-10-CM | POA: Insufficient documentation

## 2015-12-16 DIAGNOSIS — F1024 Alcohol dependence with alcohol-induced mood disorder: Secondary | ICD-10-CM | POA: Insufficient documentation

## 2015-12-16 DIAGNOSIS — R05 Cough: Secondary | ICD-10-CM | POA: Insufficient documentation

## 2015-12-16 DIAGNOSIS — Z79899 Other long term (current) drug therapy: Secondary | ICD-10-CM | POA: Insufficient documentation

## 2015-12-16 DIAGNOSIS — F17210 Nicotine dependence, cigarettes, uncomplicated: Secondary | ICD-10-CM | POA: Insufficient documentation

## 2015-12-16 DIAGNOSIS — F419 Anxiety disorder, unspecified: Secondary | ICD-10-CM | POA: Insufficient documentation

## 2015-12-16 DIAGNOSIS — F329 Major depressive disorder, single episode, unspecified: Secondary | ICD-10-CM | POA: Insufficient documentation

## 2015-12-16 DIAGNOSIS — F10230 Alcohol dependence with withdrawal, uncomplicated: Secondary | ICD-10-CM | POA: Insufficient documentation

## 2015-12-16 LAB — ACETAMINOPHEN LEVEL

## 2015-12-16 LAB — CBC
HCT: 41.6 % (ref 39.0–52.0)
Hemoglobin: 13.4 g/dL (ref 13.0–17.0)
MCH: 26.9 pg (ref 26.0–34.0)
MCHC: 32.2 g/dL (ref 30.0–36.0)
MCV: 83.4 fL (ref 78.0–100.0)
Platelets: 177 10*3/uL (ref 150–400)
RBC: 4.99 MIL/uL (ref 4.22–5.81)
RDW: 14.6 % (ref 11.5–15.5)
WBC: 6.2 10*3/uL (ref 4.0–10.5)

## 2015-12-16 LAB — COMPREHENSIVE METABOLIC PANEL
ALK PHOS: 70 U/L (ref 38–126)
ALT: 21 U/L (ref 17–63)
ANION GAP: 13 (ref 5–15)
AST: 25 U/L (ref 15–41)
Albumin: 4.4 g/dL (ref 3.5–5.0)
BILIRUBIN TOTAL: 0.6 mg/dL (ref 0.3–1.2)
BUN: 12 mg/dL (ref 6–20)
CALCIUM: 9.1 mg/dL (ref 8.9–10.3)
CO2: 23 mmol/L (ref 22–32)
Chloride: 105 mmol/L (ref 101–111)
Creatinine, Ser: 0.92 mg/dL (ref 0.61–1.24)
Glucose, Bld: 102 mg/dL — ABNORMAL HIGH (ref 65–99)
Potassium: 4.1 mmol/L (ref 3.5–5.1)
Sodium: 141 mmol/L (ref 135–145)
TOTAL PROTEIN: 8 g/dL (ref 6.5–8.1)

## 2015-12-16 LAB — ETHANOL: Alcohol, Ethyl (B): 153 mg/dL — ABNORMAL HIGH (ref <5)

## 2015-12-16 LAB — SALICYLATE LEVEL

## 2015-12-16 MED ORDER — VITAMIN B-1 100 MG PO TABS
100.0000 mg | ORAL_TABLET | Freq: Every day | ORAL | Status: DC
  Administered 2015-12-16: 100 mg via ORAL
  Filled 2015-12-16: qty 1

## 2015-12-16 MED ORDER — NICOTINE 21 MG/24HR TD PT24
21.0000 mg | MEDICATED_PATCH | Freq: Every day | TRANSDERMAL | Status: DC
  Filled 2015-12-16: qty 1

## 2015-12-16 MED ORDER — LORAZEPAM 1 MG PO TABS: 0.0000 mg | ORAL_TABLET | Freq: Two times a day (BID) | ORAL | Status: DC

## 2015-12-16 MED ORDER — LORAZEPAM 1 MG PO TABS
0.0000 mg | ORAL_TABLET | Freq: Four times a day (QID) | ORAL | Status: DC
  Administered 2015-12-16: 2 mg via ORAL
  Filled 2015-12-16: qty 2

## 2015-12-16 MED ORDER — THIAMINE HCL 100 MG/ML IJ SOLN: 100.0000 mg | Freq: Every day | INTRAMUSCULAR | Status: DC

## 2015-12-16 NOTE — Discharge Instructions (Addendum)
Alcohol Use Disorder °Alcohol use disorder is a mental disorder. It is not a one-time incident of heavy drinking. Alcohol use disorder is the excessive and uncontrollable use of alcohol over time that leads to problems with functioning in one or more areas of daily living. People with this disorder risk harming themselves and others when they drink to excess. Alcohol use disorder also can cause other mental disorders, such as mood and anxiety disorders, and serious physical problems. People with alcohol use disorder often misuse other drugs.  °Alcohol use disorder is common and widespread. Some people with this disorder drink alcohol to cope with or escape from negative life events. Others drink to relieve chronic pain or symptoms of mental illness. People with a family history of alcohol use disorder are at higher risk of losing control and using alcohol to excess.  °Drinking too much alcohol can cause injury, accidents, and health problems. One drink can be too much when you are: °· Working. °· Pregnant or breastfeeding. °· Taking medicines. Ask your doctor. °· Driving or planning to drive. °SYMPTOMS  °Signs and symptoms of alcohol use disorder may include the following:  °· Consumption of alcohol in larger amounts or over a longer period of time than intended. °· Multiple unsuccessful attempts to cut down or control alcohol use.   °· A great deal of time spent obtaining alcohol, using alcohol, or recovering from the effects of alcohol (hangover). °· A strong desire or urge to use alcohol (cravings).   °· Continued use of alcohol despite problems at work, school, or home because of alcohol use.   °· Continued use of alcohol despite problems in relationships because of alcohol use. °· Continued use of alcohol in situations when it is physically hazardous, such as driving a car. °· Continued use of alcohol despite awareness of a physical or psychological problem that is likely related to alcohol use. Physical  problems related to alcohol use can involve the brain, heart, liver, stomach, and intestines. Psychological problems related to alcohol use include intoxication, depression, anxiety, psychosis, delirium, and dementia.   °· The need for increased amounts of alcohol to achieve the same desired effect, or a decreased effect from the consumption of the same amount of alcohol (tolerance). °· Withdrawal symptoms upon reducing or stopping alcohol use, or alcohol use to reduce or avoid withdrawal symptoms. Withdrawal symptoms include: °¨ Racing heart. °¨ Hand tremor. °¨ Difficulty sleeping. °¨ Nausea. °¨ Vomiting. °¨ Hallucinations. °¨ Restlessness. °¨ Seizures. °DIAGNOSIS °Alcohol use disorder is diagnosed through an assessment by your health care provider. Your health care provider may start by asking three or four questions to screen for excessive or problematic alcohol use. To confirm a diagnosis of alcohol use disorder, at least two symptoms must be present within a 12-month period. The severity of alcohol use disorder depends on the number of symptoms: °· Mild--two or three. °· Moderate--four or five. °· Severe--six or more. °Your health care provider may perform a physical exam or use results from lab tests to see if you have physical problems resulting from alcohol use. Your health care provider may refer you to a mental health professional for evaluation. °TREATMENT  °Some people with alcohol use disorder are able to reduce their alcohol use to low-risk levels. Some people with alcohol use disorder need to quit drinking alcohol. When necessary, mental health professionals with specialized training in substance use treatment can help. Your health care provider can help you decide how severe your alcohol use disorder is and what type of treatment you need.   The following forms of treatment are available:   Detoxification. Detoxification involves the use of prescription medicines to prevent alcohol withdrawal  symptoms in the first week after quitting. This is important for people with a history of symptoms of withdrawal and for heavy drinkers who are likely to have withdrawal symptoms. Alcohol withdrawal can be dangerous and, in severe cases, cause death. Detoxification is usually provided in a hospital or in-patient substance use treatment facility.  Counseling or talk therapy. Talk therapy is provided by substance use treatment counselors. It addresses the reasons people use alcohol and ways to keep them from drinking again. The goals of talk therapy are to help people with alcohol use disorder find healthy activities and ways to cope with life stress, to identify and avoid triggers for alcohol use, and to handle cravings, which can cause relapse.  Medicines.Different medicines can help treat alcohol use disorder through the following actions:  Decrease alcohol cravings.  Decrease the positive reward response felt from alcohol use.  Produce an uncomfortable physical reaction when alcohol is used (aversion therapy).  Support groups. Support groups are run by people who have quit drinking. They provide emotional support, advice, and guidance. These forms of treatment are often combined. Some people with alcohol use disorder benefit from intensive combination treatment provided by specialized substance use treatment centers. Both inpatient and outpatient treatment programs are available.   This information is not intended to replace advice given to you by your health care provider. Make sure you discuss any questions you have with your health care provider.   Document Released: 11/28/2004 Document Revised: 11/11/2014 Document Reviewed: 01/28/2013 Elsevier Interactive Patient Education 2016 [employer]   Patient to follow up with opne of the following upong discharge as recommended by psychiatry:  [facility]   [facility] [address]     [address] [place], [place] [address]                 [place], [place] [phone]                  [phone] Admissions: 8am-3pm M-F  [facility] ([facility] [address] [place], [place] [phone] or [phone]  [facility] (Medication management, therapy, and crisis services) ACCESS LINE:  [phone] or [phone] [facility] [address] [place], [place] [address]

## 2015-12-16 NOTE — [facility] Suicide Risk Assessment (Signed)
Suicide Risk Assessment  Discharge Assessment   [facility] Discharge Suicide Risk Assessment   Principal Problem: Alcohol-induced mood disorder [facility]) Discharge Diagnoses:  Patient Active Problem List   Diagnosis Date Noted  . Alcohol-induced mood disorder (HCC) [F10.94] 12/16/2015    Priority: High  . Alcohol dependence with uncomplicated withdrawal (HCC) [F10.230] 12/16/2015    Priority: High  . GAD (generalized anxiety disorder) [F41.1] 01/14/2014    Total Time spent with patient: 45 minutes  Musculoskeletal: Strength & Muscle Tone: within normal limits Gait & Station: normal Patient leans: N/A  Psychiatric Specialty Exam: Review of Systems  Constitutional: Negative.   HENT: Negative.   Eyes: Negative.   Respiratory: Negative.   Cardiovascular: Negative.   Gastrointestinal: Negative.   Genitourinary: Negative.   Musculoskeletal: Negative.   Skin: Negative.   Neurological: Negative.   Endo/Heme/Allergies: Negative.   Psychiatric/Behavioral: Positive for substance abuse.    Blood pressure 109/72, pulse 72, temperature 98.4 F (36.9 C), temperature source Oral, resp. rate 13, SpO2 99 %.There is no weight on file to calculate BMI.  General Appearance: Casual  Eye Contact::  Good  Speech:  Normal Rate  Volume:  Normal  Mood:  Depressed, mild  Affect:  Congruent  Thought Process:  Coherent  Orientation:  Full (Time, Place, and Person)  Thought Content:  WDL  Suicidal Thoughts:  No  Homicidal Thoughts:  No  Memory:  Immediate;   Good Recent;   Good Remote;   Good  Judgement:  Fair  Insight:  Good  Psychomotor Activity:  Normal  Concentration:  Good  Recall:  Good  Fund of Knowledge:Good  Language: Good  Akathisia:  No  Handed:  Right  AIMS (if indicated):     Assets:  Housing Leisure Time Physical Health Resilience Social Support  ADL's:  Intact  Cognition: WNL  Sleep:      Mental Status Per Nursing Assessment::   On Admission:   Alcohol abuse with  passive suicidal ideations  Demographic Factors:  Male and Adolescent or young adult  Loss Factors: NA  Historical Factors: Impulsivity  Risk Reduction Factors:   Sense of responsibility to family, Living with another person, especially a relative and Positive social support  Continued Clinical Symptoms:  Depression, mild  Cognitive Features That Contribute To Risk:  None    Suicide Risk:  Minimal: No identifiable suicidal ideation.  Patients presenting with no risk factors but with morbid ruminations; may be classified as minimal risk based on the severity of the depressive symptoms    Plan Of Care/Follow-up recommendations:  Activity:  as tolerated  Diet:  heart healthy diet  Isha Seefeld, NP 12/16/2015, 11:34 AM

## 2015-12-16 NOTE — [facility] Counselor (Addendum)
Discharge is recommended by Diannia Ruder, MD with outpatient substance abuse resources. Patient received resources for [facility], and [facility] per psychiatrist request. Patient requested a bus pass and denies any questions or concerns. Patient states that he has been to [facility] and will follow up with [facility] or [facility] once he is discharged from the hospital. Patients bus pass was provided to his nurse.    Davina Poke, LCSW Therapeutic Triage Specialist [facility] 12/16/2015 11:13 AM

## 2015-12-16 NOTE — ED Notes (Signed)
Patient ambulates and performs activities of daily living independently. 

## 2015-12-16 NOTE — ED Notes (Signed)
Patient aware of needed urine sample.  

## 2015-12-16 NOTE — ED Provider Notes (Signed)
CSN: [id number]     Arrival date & time 12/16/15  0041 History   By signing my name below, I, Lyndel Safe, attest that this documentation has been prepared under the direction and in the presence of Rolan Bucco, MD. Electronically Signed: Lyndel Safe, ED Scribe. 12/16/2015. 2:15 AM.   Chief Complaint  Patient presents with  . Medical Clearance   The history is provided by the patient. No language interpreter was used.   HPI Comments: Chad Sanchez is a 45 y.o. male, with a h/o alcohol abuse, anxiety, and depression, who presents to the Emergency Department voluntarily with [facility] escort for medical clearance evaluation. Pt states he is suffering from depression, anxiety and experiencing alcohol withdrawals. He notes consuming an 18 pack of beer per day, last consumption of EtOH was ~ 12 hours ago. He reports SI but does not state a plan. Denies illicit drug use today but reports use of cocaine in the past. He is not followed by a counselor and is not prescribed depression medication. Denies HI, auditory or visual hallucinations. Pt is a current, daily smoker and reports a recent cough.  Past Medical History  Diagnosis Date  . Alcohol abuse   . Depression   . Anxiety    Past Surgical History  Procedure Laterality Date  . Tonsillectomy     History reviewed. No pertinent family history. Social History  Substance Use Topics  . Smoking status: Current Every Day Smoker -- 1.00 packs/day    Types: Cigarettes  . Smokeless tobacco: Never Used  . Alcohol Use: 0.6 oz/week    1 Cans of beer per week     Comment: a case a day; pt told this writer 12 pk daily or a fifth of vodka    Review of Systems  Constitutional: Negative for fever, chills, diaphoresis and fatigue.  HENT: Negative for congestion, rhinorrhea and sneezing.   Eyes: Negative.   Respiratory: Positive for cough. Negative for chest tightness and shortness of breath.   Cardiovascular: Negative for chest pain and leg  swelling.  Gastrointestinal: Negative for nausea, vomiting, abdominal pain, diarrhea and blood in stool.  Genitourinary: Negative for frequency, hematuria, flank pain and difficulty urinating.  Musculoskeletal: Negative for back pain and arthralgias.  Skin: Negative for rash.  Neurological: Negative for dizziness, speech difficulty, weakness, numbness and headaches.  Psychiatric/Behavioral: Positive for suicidal ideas and dysphoric mood. Negative for hallucinations and self-injury.   Allergies  Shellfish allergy  Home Medications   Prior to Admission medications   Medication Sig Start Date End Date Taking? Authorizing Provider  gabapentin (NEURONTIN) 100 MG capsule Take 1 capsule (100 mg total) by mouth 3 (three) times daily. Patient taking differently: Take 100 mg by mouth 3 (three) times daily. Patient took this about 2 days ago but dont normally take it because he dont like it 01/25/14   Rolland Porter, MD  traZODone (DESYREL) 50 MG tablet Take 1 tablet (50 mg total) by mouth at bedtime as needed for sleep. Patient taking differently: Take 50 mg by mouth at bedtime as needed for sleep. Patient took this about 2 days ago but dont normally take it because he dont like it 01/17/14   Sanjuana Kava, NP   BP 119/89 mmHg  Pulse 80  Temp(Src) 97.6 F (36.4 C) (Oral)  Resp 14  SpO2 100% Physical Exam  Constitutional: He is oriented to person, place, and time. He appears well-developed and well-nourished.  HENT:  Head: Normocephalic and atraumatic.  Eyes: Pupils are  equal, round, and reactive to light.  Neck: Normal range of motion. Neck supple.  Cardiovascular: Normal rate, regular rhythm and normal heart sounds.   Pulmonary/Chest: Effort normal and breath sounds normal. No respiratory distress. He has no wheezes. He has no rales. He exhibits no tenderness.  Abdominal: Soft. Bowel sounds are normal. There is no tenderness. There is no rebound and no guarding.  Musculoskeletal: Normal range of  motion. He exhibits no edema.  Lymphadenopathy:    He has no cervical adenopathy.  Neurological: He is alert and oriented to person, place, and time.  No tremor.  Skin: Skin is warm and dry. No rash noted.  Psychiatric: He has a normal mood and affect.    ED Course  Procedures  DIAGNOSTIC STUDIES: Oxygen Saturation is 100% on RA, normal by my interpretation.    COORDINATION OF CARE: 2:13 AM Discussed treatment plan with pt at bedside and pt agreed to plan.   Labs Review Results for orders placed or performed during the hospital encounter of 12/16/15  Comprehensive metabolic panel  Result Value Ref Range   Sodium 141 135 - 145 mmol/L   Potassium 4.1 3.5 - 5.1 mmol/L   Chloride 105 101 - 111 mmol/L   CO2 23 22 - 32 mmol/L   Glucose, Bld 102 (H) 65 - 99 mg/dL   BUN 12 6 - 20 mg/dL   Creatinine, Ser 4.09 0.61 - 1.24 mg/dL   Calcium 9.1 8.9 - 81.1 mg/dL   Total Protein 8.0 6.5 - 8.1 g/dL   Albumin 4.4 3.5 - 5.0 g/dL   AST 25 15 - 41 U/L   ALT 21 17 - 63 U/L   Alkaline Phosphatase 70 38 - 126 U/L   Total Bilirubin 0.6 0.3 - 1.2 mg/dL   GFR calc non Af Amer >60 >60 mL/min   GFR calc Af Amer >60 >60 mL/min   Anion gap 13 5 - 15  Ethanol (ETOH)  Result Value Ref Range   Alcohol, Ethyl (B) 153 (H) <5 mg/dL  Salicylate level  Result Value Ref Range   Salicylate Lvl <4.0 2.8 - 30.0 mg/dL  Acetaminophen level  Result Value Ref Range   Acetaminophen (Tylenol), Serum <10 (L) 10 - 30 ug/mL  CBC  Result Value Ref Range   WBC 6.2 4.0 - 10.5 K/uL   RBC 4.99 4.22 - 5.81 MIL/uL   Hemoglobin 13.4 13.0 - 17.0 g/dL   HCT 91.4 78.2 - 95.6 %   MCV 83.4 78.0 - 100.0 fL   MCH 26.9 26.0 - 34.0 pg   MCHC 32.2 30.0 - 36.0 g/dL   RDW 21.3 08.6 - 57.8 %   Platelets 177 150 - 400 K/uL   Dg Chest 2 View  12/16/2015  CLINICAL DATA:  Acute onset of cough.  Initial encounter. EXAM: CHEST  2 VIEW COMPARISON:  Chest radiograph performed 01/13/2014 FINDINGS: The lungs are well-aerated and clear.  There is no evidence of focal opacification, pleural effusion or pneumothorax. Bilateral nipple shadows are seen. The heart is normal in size; the mediastinal contour is within normal limits. No acute osseous abnormalities are seen. IMPRESSION: No acute cardiopulmonary process seen. Electronically Signed   By: Roanna Raider M.D.   On: 12/16/2015 02:26     Images Review Dg Chest 2 View  12/16/2015  CLINICAL DATA:  Acute onset of cough.  Initial encounter. EXAM: CHEST  2 VIEW COMPARISON:  Chest radiograph performed 01/13/2014 FINDINGS: The lungs are well-aerated and clear. There is no  evidence of focal opacification, pleural effusion or pneumothorax. Bilateral nipple shadows are seen. The heart is normal in size; the mediastinal contour is within normal limits. No acute osseous abnormalities are seen. IMPRESSION: No acute cardiopulmonary process seen. Electronically Signed   By: Roanna Raider M.D.   On: 12/16/2015 02:26    I have personally reviewed and evaluated these images and lab results as part of my medical decision-making.   MDM   Final diagnoses:  ETOH abuse  Suicidal ideation    Pt with ETOH abuse and SI.  Will start CIWA protocol.  Pt is medically cleared.  Has been seen by TTS who recommends inpatient placement.  Awaiting placement  I personally performed the services described in this documentation, which was scribed in my presence.  The recorded information has been reviewed and considered.    Rolan Bucco, MD 12/16/15 [phone]

## 2015-12-16 NOTE — ED Notes (Signed)
Pt states also that he doesn't take his depression medication because it causes "manhood" erectile dysfunction

## 2015-12-16 NOTE — Consult Note (Signed)
[place] [facility] Psychiatry Consult   Reason for Consult:  Passive suicidal ideations, alcohol abuse Referring Physician:  EDP Patient Identification: Chad Sanchez MRN:  [id number] Principal Diagnosis: Alcohol-induced mood disorder ([place]) Diagnosis:   Patient Active Problem List   Diagnosis Date Noted  . Alcohol-induced mood disorder ([place]) [F10.94] 12/16/2015    Priority: High  . Alcohol dependence with uncomplicated withdrawal ([place]) [F10.230] 12/16/2015    Priority: High  . GAD (generalized anxiety disorder) [F41.1] 01/14/2014    Total Time spent with patient: 45 minutes  Subjective:   Chad Sanchez is a 45 y.o. male patient does not warrant admission.  HPI:  46 yo male who presented with alcohol abuse and passive suicidal ideations, history of alcohol abuse and depression.  He recently moved from [facility] to live with his girlfriend in [place].  Over the past two weeks,he has been drinking daily with an increase in his depression.  Denies suicidal ideations today, no homicidal ideations, hallucinations, does report some crack/cocaine use.  Calm, cooperative, engaging.  He wants to be connected to outpatient therapy for his depression as he feels he self medicates with alcohol for short periods of time.  He was in [place] for two stints and has periods of doing well.  Discussed a few counseling strategies for him to redirect some of his negative self-talk.  Chad Sanchez wants to leave and follow-up outpatient, stable for discharge.  Past Psychiatric History: Alcohol abuse, depression  Risk to Self: Suicidal Ideation: Yes-Currently Present Suicidal Intent: No Is patient at risk for suicide?: No Suicidal Plan?: No Access to Means: No What has been your use of drugs/alcohol within the last 12 months?: Alcohol and cocaine use reported  How many times?: 0 Other Self Harm Risks: None reported  Triggers for Past Attempts: None known Intentional Self Injurious Behavior:  None Risk to Others: Homicidal Ideation: No Thoughts of Harm to Others: No Current Homicidal Intent: No Current Homicidal Plan: No Access to Homicidal Means: No Identified Victim: N/A History of harm to others?: No Assessment of Violence: None Noted Violent Behavior Description: No violent behaviors reported.  Does patient have access to weapons?: No Criminal Charges Pending?: No Does patient have a court date: No Prior Inpatient Therapy: Prior Inpatient Therapy: Yes Prior Therapy Dates: 2011, 2014, 2015 Prior Therapy Facilty/Provider(s): [facility]  Reason for Treatment: Substance abuse  Prior Outpatient Therapy: Prior Outpatient Therapy: No Does patient have an ACCT team?: No Does patient have Intensive In-House Services?  : No Does patient have [facility] services? : No Does patient have P4CC services?: No  Past Medical History:  Past Medical History  Diagnosis Date  . Alcohol abuse   . Depression   . Anxiety     Past Surgical History  Procedure Laterality Date  . Tonsillectomy     Family History: History reviewed. No pertinent family history. Family Psychiatric  History: None Social History:  History  Alcohol Use  . 0.6 oz/week  . 1 Cans of beer per week    Comment: a case a day; pt told this writer 12 pk daily or a fifth of vodka     History  Drug Use  . Yes  . Special: "Crack" cocaine, Cocaine    Comment: Pt endorses using crack cocaine    Social History   Social History  . Marital Status: Single    Spouse Name: N/A  . Number of Children: N/A  . Years of Education: N/A   Social History Main Topics  . Smoking  status: Current Every Day Smoker -- 1.00 packs/day    Types: Cigarettes  . Smokeless tobacco: Never Used  . Alcohol Use: 0.6 oz/week    1 Cans of beer per week     Comment: a case a day; pt told this writer 12 pk daily or a fifth of vodka  . Drug Use: Yes    Special: "Crack" cocaine, Cocaine     Comment: Pt endorses using crack cocaine  . Sexual  Activity: Not Currently   Other Topics Concern  . None   Social History Narrative   Additional Social History:    Allergies:   Allergies  Allergen Reactions  . Shellfish Allergy Anaphylaxis    Labs:  Results for orders placed or performed during the hospital encounter of 12/16/15 (from the past 48 hour(s))  Comprehensive metabolic panel     Status: Abnormal   Collection Time: 12/16/15  1:48 AM  Result Value Ref Range   Sodium 141 135 - 145 mmol/L   Potassium 4.1 3.5 - 5.1 mmol/L   Chloride 105 101 - 111 mmol/L   CO2 23 22 - 32 mmol/L   Glucose, Bld 102 (H) 65 - 99 mg/dL   BUN 12 6 - 20 mg/dL   Creatinine, Ser 0.92 0.61 - 1.24 mg/dL   Calcium 9.1 8.9 - 10.3 mg/dL   Total Protein 8.0 6.5 - 8.1 g/dL   Albumin 4.4 3.5 - 5.0 g/dL   AST 25 15 - 41 U/L   ALT 21 17 - 63 U/L   Alkaline Phosphatase 70 38 - 126 U/L   Total Bilirubin 0.6 0.3 - 1.2 mg/dL   GFR calc non Af Amer >60 >60 mL/min   GFR calc Af Amer >60 >60 mL/min    Comment: (NOTE) The eGFR has been calculated using the CKD EPI equation. This calculation has not been validated in all clinical situations. eGFR's persistently <60 mL/min signify possible Chronic Kidney Disease.    Anion gap 13 5 - 15  Ethanol (ETOH)     Status: Abnormal   Collection Time: 12/16/15  1:48 AM  Result Value Ref Range   Alcohol, Ethyl (B) 153 (H) <5 mg/dL    Comment:        LOWEST DETECTABLE LIMIT FOR SERUM ALCOHOL IS 5 mg/dL FOR MEDICAL PURPOSES ONLY   Salicylate level     Status: None   Collection Time: 12/16/15  1:48 AM  Result Value Ref Range   Salicylate Lvl <3.5 2.8 - 30.0 mg/dL  Acetaminophen level     Status: Abnormal   Collection Time: 12/16/15  1:48 AM  Result Value Ref Range   Acetaminophen (Tylenol), Serum <10 (L) 10 - 30 ug/mL    Comment:        THERAPEUTIC CONCENTRATIONS VARY SIGNIFICANTLY. A RANGE OF 10-30 ug/mL MAY BE AN EFFECTIVE CONCENTRATION FOR MANY PATIENTS. HOWEVER, SOME ARE BEST TREATED AT  CONCENTRATIONS OUTSIDE THIS RANGE. ACETAMINOPHEN CONCENTRATIONS >150 ug/mL AT 4 HOURS AFTER INGESTION AND >50 ug/mL AT 12 HOURS AFTER INGESTION ARE OFTEN ASSOCIATED WITH TOXIC REACTIONS.   CBC     Status: None   Collection Time: 12/16/15  1:48 AM  Result Value Ref Range   WBC 6.2 4.0 - 10.5 K/uL   RBC 4.99 4.22 - 5.81 MIL/uL   Hemoglobin 13.4 13.0 - 17.0 g/dL   HCT 41.6 39.0 - 52.0 %   MCV 83.4 78.0 - 100.0 fL   MCH 26.9 26.0 - 34.0 pg   MCHC 32.2 30.0 -  36.0 g/dL   RDW 14.6 11.5 - 15.5 %   Platelets 177 150 - 400 K/uL    Current Facility-Administered Medications  Medication Dose Route Frequency Provider Last Rate Last Dose  . LORazepam (ATIVAN) tablet 0-4 mg  0-4 mg Oral 4 times per day Malvin Johns, MD   2 mg at 12/16/15 0930   Followed by  . [START ON 12/18/2015] LORazepam (ATIVAN) tablet 0-4 mg  0-4 mg Oral Q12H Malvin Johns, MD      . nicotine (NICODERM CQ - dosed in mg/24 hours) patch 21 mg  21 mg Transdermal Daily Malvin Johns, MD   21 mg at 12/16/15 0931  . thiamine (VITAMIN B-1) tablet 100 mg  100 mg Oral Daily Malvin Johns, MD   100 mg at 12/16/15 9233   Or  . thiamine (B-1) injection 100 mg  100 mg Intravenous Daily Malvin Johns, MD       Current Outpatient Prescriptions  Medication Sig Dispense Refill  . gabapentin (NEURONTIN) 100 MG capsule Take 1 capsule (100 mg total) by mouth 3 (three) times daily. (Patient taking differently: Take 100 mg by mouth 3 (three) times daily. Patient took this about 2 days ago but dont normally take it because he dont like it) 15 capsule 0  . traZODone (DESYREL) 50 MG tablet Take 1 tablet (50 mg total) by mouth at bedtime as needed for sleep. (Patient taking differently: Take 50 mg by mouth at bedtime as needed for sleep. Patient took this about 2 days ago but dont normally take it because he dont like it) 60 tablet 0    Musculoskeletal: Strength & Muscle Tone: within normal limits Gait & Station: normal Patient leans:  N/A  Psychiatric Specialty Exam: Review of Systems  Constitutional: Negative.   HENT: Negative.   Eyes: Negative.   Respiratory: Negative.   Cardiovascular: Negative.   Gastrointestinal: Negative.   Genitourinary: Negative.   Musculoskeletal: Negative.   Skin: Negative.   Neurological: Negative.   Endo/Heme/Allergies: Negative.   Psychiatric/Behavioral: Positive for substance abuse.    Blood pressure 109/72, pulse 72, temperature 98.4 F (36.9 C), temperature source Oral, resp. rate 13, SpO2 99 %.There is no weight on file to calculate BMI.  General Appearance: Casual  Eye Contact::  Good  Speech:  Normal Rate  Volume:  Normal  Mood:  Depressed, mild  Affect:  Congruent  Thought Process:  Coherent  Orientation:  Full (Time, Place, and Person)  Thought Content:  WDL  Suicidal Thoughts:  No  Homicidal Thoughts:  No  Memory:  Immediate;   Good Recent;   Good Remote;   Good  Judgement:  Fair  Insight:  Good  Psychomotor Activity:  Normal  Concentration:  Good  Recall:  Good  Fund of Knowledge:Good  Language: Good  Akathisia:  No  Handed:  Right  AIMS (if indicated):     Assets:  Housing Leisure Time Physical Health Resilience Social Support  ADL's:  Intact  Cognition: WNL  Sleep:      Treatment Plan Summary: Daily contact with patient to assess and evaluate symptoms and progress in treatment, Medication management and Plan alcohol induced mood disorder:  -Crisis stabilization -Medication management:  Started CIWA Ativan Alcohol detox protocol -Individual and substance abuse counseling provided -Outpatient resources provided with a bus pass  Disposition: No evidence of imminent risk to self or others at present.    Waylan Boga, NP 12/16/2015 11:21 AM   Patient seen and I agree with treatment and plan  Levonne Spiller M.D.

## 2015-12-16 NOTE — ED Notes (Signed)
MD at bedside. Psychiatry  

## 2015-12-16 NOTE — BH Assessment (Signed)
Assessment completed. Consulted Hulan Fess, NP who recommended inpatient treatment. TTS to seek placement. Dr. Fredderick Phenix has been informed of the recommendation.

## 2015-12-16 NOTE — BH Assessment (Signed)
Tele Assessment Note   Chad Sanchez is an 45 y.o. male presenting to [facility] reporting depression and SI without active plan. Pt denies HI and AVH at this time. Pt is currently not receiving any mental health treatment at this time; however he reports previous inpatient treatment. PT reported that he has been abusing alcohol and cocaine. Pt is endorsing multiple depressive symptoms but denies having any stressors.  Pt meets inpatient criteria.  Diagnosis: Major Depressive Disorder, Recurrent; Alcohol Use Disorder, Severe   Past Medical History:  Past Medical History  Diagnosis Date  . Alcohol abuse   . Depression   . Anxiety     Past Surgical History  Procedure Laterality Date  . Tonsillectomy      Family History: History reviewed. No pertinent family history.  Social History:  reports that he has been smoking Cigarettes.  He has been smoking about 1.00 pack per day. He has never used smokeless tobacco. He reports that he drinks about 0.6 oz of alcohol per week. He reports that he uses illicit drugs ("Crack" cocaine and Cocaine).  Additional Social History:  Alcohol / Drug Use History of alcohol / drug use?: Yes Longest period of sobriety (when/how long): Pt reported that he was able to remain sober for 4 years in the past.  Substance #1 Name of Substance 1: Alcohol  1 - Age of First Use: 29 1 - Amount (size/oz): "as much as I can"  1 - Frequency: 3 weekly  1 - Duration: ongoing  1 - Last Use / Amount: 12-15-15 Substance #2 Name of Substance 2: Cocaine  2 - Age of First Use: 45 years of age 32 - Amount (size/oz): <1 gram 2 - Frequency: Twice per week on average 32 - Duration: Ongoing 2 - Last Use / Amount: 12-13-15 BAL-153  CIWA: CIWA-Ar BP: 119/89 mmHg Pulse Rate: 80 COWS:    PATIENT STRENGTHS: (choose at least two) Average or above average intelligence Supportive family/friends  Allergies:  Allergies  Allergen Reactions  . Shellfish Allergy Anaphylaxis     Home Medications:  (Not in a hospital admission)  OB/GYN Status:  No LMP for male patient.  General Assessment Data Location of Assessment: [facility] ED TTS Assessment: In system Is this a Tele or Face-to-Face Assessment?: Face-to-Face Is this an Initial Assessment or a Re-assessment for this encounter?: Initial Assessment Marital status: Single Living Arrangements: Non-relatives/Friends Can pt return to current living arrangement?: Yes Admission Status: Voluntary Is patient capable of signing voluntary admission?: Yes Referral Source: Self/Family/Friend Insurance type: None      Crisis Care Plan Living Arrangements: Non-relatives/Friends Name of Psychiatrist: No provider reported Name of Therapist: No provider reported   Education Status Is patient currently in school?: No Current Grade: N/A Highest grade of school patient has completed: N/A Name of school: N/A Contact person: N/A  Risk to self with the past 6 months Suicidal Ideation: Yes-Currently Present Has patient been a risk to self within the past 6 months prior to admission? : No Suicidal Intent: No Has patient had any suicidal intent within the past 6 months prior to admission? : No Is patient at risk for suicide?: No Suicidal Plan?: No Has patient had any suicidal plan within the past 6 months prior to admission? : No Access to Means: No What has been your use of drugs/alcohol within the last 12 months?: Alcohol and cocaine use reported  Previous Attempts/Gestures: No How many times?: 0 Other Self Harm Risks: None reported  Triggers for Past  Attempts: None known Intentional Self Injurious Behavior: None Family Suicide History: Unknown Recent stressful life event(s):  (None reported ) Persecutory voices/beliefs?: No Depression: Yes Depression Symptoms: Despondent, Insomnia, Isolating, Guilt, Loss of interest in usual pleasures, Feeling angry/irritable, Feeling worthless/self pity, Fatigue Substance abuse  history and/or treatment for substance abuse?: Yes  Risk to Others within the past 6 months Homicidal Ideation: No Does patient have any lifetime risk of violence toward others beyond the six months prior to admission? : No Thoughts of Harm to Others: No Current Homicidal Intent: No Current Homicidal Plan: No Access to Homicidal Means: No Identified Victim: N/A History of harm to others?: No Assessment of Violence: None Noted Violent Behavior Description: No violent behaviors reported.  Does patient have access to weapons?: No Criminal Charges Pending?: No Does patient have a court date: No Is patient on probation?: No  Psychosis Hallucinations: None noted Delusions: None noted  Mental Status Report Appearance/Hygiene: Unremarkable Eye Contact: Poor Motor Activity: Freedom of movement Speech: Logical/coherent, Slow Level of Consciousness: Quiet/awake Mood: Depressed, Sad Affect: Appropriate to circumstance Anxiety Level: Minimal Thought Processes: Relevant, Coherent Judgement: Unimpaired Orientation: Appropriate for developmental age Obsessive Compulsive Thoughts/Behaviors: None  Cognitive Functioning Concentration: Decreased Memory: Recent Intact, Remote Intact IQ: Average Insight: Fair Impulse Control: Fair Appetite: Poor Weight Loss: 0 Weight Gain: 0 Sleep: Decreased Total Hours of Sleep: 5 Vegetative Symptoms: Staying in bed, Decreased grooming  ADLScreening [facility] Assessment Services) Patient's cognitive ability adequate to safely complete daily activities?: Yes Patient able to express need for assistance with ADLs?: Yes Independently performs ADLs?: Yes (appropriate for developmental age)  Prior Inpatient Therapy Prior Inpatient Therapy: Yes Prior Therapy Dates: 2011, 2014, 2015 Prior Therapy Facilty/Provider(s): [facility]  Reason for Treatment: Substance abuse   Prior Outpatient Therapy Prior Outpatient Therapy: No Does patient have an ACCT team?:  No Does patient have Intensive In-House Services?  : No Does patient have [facility] services? : No Does patient have P4CC services?: No  ADL Screening (condition at time of admission) Patient's cognitive ability adequate to safely complete daily activities?: Yes Is the patient deaf or have difficulty hearing?: No Does the patient have difficulty seeing, even when wearing glasses/contacts?: No Does the patient have difficulty concentrating, remembering, or making decisions?: No Patient able to express need for assistance with ADLs?: Yes Does the patient have difficulty dressing or bathing?: No Independently performs ADLs?: Yes (appropriate for developmental age)       Abuse/Neglect Assessment (Assessment to be complete while patient is alone) Physical Abuse: Denies Verbal Abuse: Denies Sexual Abuse: Denies Exploitation of patient/patient's resources: Denies Self-Neglect: Denies     Merchant [employer] officer (For Healthcare) Does patient have an advance directive?: No Would patient like information on creating an advanced directive?: No - patient declined information    Additional Information 1:1 In Past 12 Months?: Yes CIRT Risk: No Elopement Risk: No Does patient have medical clearance?: Yes     Disposition:  Disposition Initial Assessment Completed for this Encounter: Yes Disposition of Patient: Inpatient treatment program  Arend Bahl S 12/16/2015 3:55 AM
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
## 2015-12-17 ENCOUNTER — Emergency Department (HOSPITAL_COMMUNITY)
Admission: EM | Admit: 2015-12-17 | Discharge: 2015-12-17 | Disposition: A | Discharge status: Home / Self Care | Payer: Self-pay | Attending: Emergency Medicine | Admitting: Emergency Medicine

## 2015-12-17 ENCOUNTER — Encounter (HOSPITAL_COMMUNITY): Payer: Self-pay | Admitting: Emergency Medicine

## 2015-12-17 ENCOUNTER — Emergency Department (HOSPITAL_COMMUNITY): Payer: Self-pay

## 2015-12-17 DIAGNOSIS — F419 Anxiety disorder, unspecified: Secondary | ICD-10-CM | POA: Insufficient documentation

## 2015-12-17 DIAGNOSIS — S3992XA Unspecified injury of lower back, initial encounter: Secondary | ICD-10-CM | POA: Insufficient documentation

## 2015-12-17 DIAGNOSIS — S29001A Unspecified injury of muscle and tendon of front wall of thorax, initial encounter: Secondary | ICD-10-CM | POA: Insufficient documentation

## 2015-12-17 DIAGNOSIS — R0781 Pleurodynia: Secondary | ICD-10-CM

## 2015-12-17 DIAGNOSIS — M545 Low back pain: Secondary | ICD-10-CM

## 2015-12-17 DIAGNOSIS — Y998 Other external cause status: Secondary | ICD-10-CM | POA: Insufficient documentation

## 2015-12-17 DIAGNOSIS — F17210 Nicotine dependence, cigarettes, uncomplicated: Secondary | ICD-10-CM | POA: Insufficient documentation

## 2015-12-17 DIAGNOSIS — S3991XA Unspecified injury of abdomen, initial encounter: Secondary | ICD-10-CM | POA: Insufficient documentation

## 2015-12-17 DIAGNOSIS — Y92512 Supermarket, store or market as the place of occurrence of the external cause: Secondary | ICD-10-CM | POA: Insufficient documentation

## 2015-12-17 DIAGNOSIS — F329 Major depressive disorder, single episode, unspecified: Secondary | ICD-10-CM | POA: Insufficient documentation

## 2015-12-17 DIAGNOSIS — R7989 Other specified abnormal findings of blood chemistry: Secondary | ICD-10-CM | POA: Insufficient documentation

## 2015-12-17 DIAGNOSIS — Y9389 Activity, other specified: Secondary | ICD-10-CM | POA: Insufficient documentation

## 2015-12-17 LAB — I-STAT CHEM 8, ED
BUN: 15 mg/dL (ref 6–20)
CALCIUM ION: 1.07 mmol/L — AB (ref 1.12–1.23)
CHLORIDE: 103 mmol/L (ref 101–111)
Creatinine, Ser: 1.4 mg/dL — ABNORMAL HIGH (ref 0.61–1.24)
Glucose, Bld: 86 mg/dL (ref 65–99)
HCT: 45 % (ref 39.0–52.0)
Hemoglobin: 15.3 g/dL (ref 13.0–17.0)
POTASSIUM: 4 mmol/L (ref 3.5–5.1)
SODIUM: 142 mmol/L (ref 135–145)
TCO2: 24 mmol/L (ref 0–100)

## 2015-12-17 LAB — URINALYSIS, ROUTINE W REFLEX MICROSCOPIC
Bilirubin Urine: NEGATIVE
GLUCOSE, UA: NEGATIVE mg/dL
Hgb urine dipstick: NEGATIVE
Ketones, ur: NEGATIVE mg/dL
LEUKOCYTES UA: NEGATIVE
Nitrite: NEGATIVE
PH: 5 (ref 5.0–8.0)
Protein, ur: NEGATIVE mg/dL
Specific Gravity, Urine: 1.015 (ref 1.005–1.030)

## 2015-12-17 MED ORDER — KETOROLAC TROMETHAMINE 30 MG/ML IJ SOLN
30.0000 mg | Freq: Once | INTRAMUSCULAR | Status: AC
  Administered 2015-12-17: 30 mg via INTRAVENOUS
  Filled 2015-12-17: qty 1

## 2015-12-17 MED ORDER — IOHEXOL 300 MG/ML  SOLN
80.0000 mL | Freq: Once | INTRAMUSCULAR | Status: AC | PRN
  Administered 2015-12-17: 100 mL via INTRAVENOUS

## 2015-12-17 MED ORDER — IBUPROFEN 800 MG PO TABS: 800.0000 mg | ORAL_TABLET | Freq: Three times a day (TID) | ORAL | Status: DC | PRN

## 2015-12-17 MED ORDER — SODIUM CHLORIDE 0.9 % IV BOLUS (SEPSIS)
1000.0000 mL | Freq: Once | INTRAVENOUS | Status: AC
  Administered 2015-12-17: 1000 mL via INTRAVENOUS
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
## 2015-12-17 NOTE — ED Notes (Addendum)
Disregard previous note r/e pain medication

## 2015-12-17 NOTE — ED Notes (Signed)
Provided patient with saltine crackers and gingerale.

## 2015-12-17 NOTE — Discharge Instructions (Signed)
Back Pain, Adult Back pain is very common in adults.The cause of back pain is rarely dangerous and the pain often gets better over time.The cause of your back pain may not be known. Some common causes of back pain include:  Strain of the muscles or ligaments supporting the spine.  Wear and tear (degeneration) of the spinal disks.  Arthritis.  Direct injury to the back. For many people, back pain may return. Since back pain is rarely dangerous, most people can learn to manage this condition on their own. HOME CARE INSTRUCTIONS Watch your back pain for any changes. The following actions may help to lessen any discomfort you are feeling:  Remain active. It is stressful on your back to sit or stand in one place for long periods of time. Do not sit, drive, or stand in one place for more than 30 minutes at a time. Take short walks on even surfaces as soon as you are able.Try to increase the length of time you walk each day.  Exercise regularly as directed by your health care provider. Exercise helps your back heal faster. It also helps avoid future injury by keeping your muscles strong and flexible.  Do not stay in bed.Resting more than 1-2 days can delay your recovery.  Pay attention to your body when you bend and lift. The most comfortable positions are those that put less stress on your recovering back. Always use proper lifting techniques, including:  Bending your knees.  Keeping the load close to your body.  Avoiding twisting.  Find a comfortable position to sleep. Use a firm mattress and lie on your side with your knees slightly bent. If you lie on your back, put a pillow under your knees.  Avoid feeling anxious or stressed.Stress increases muscle tension and can worsen back pain.It is important to recognize when you are anxious or stressed and learn ways to manage it, such as with exercise.  Take medicines only as directed by your health care provider. Over-the-counter  medicines to reduce pain and inflammation are often the most helpful.Your health care provider may prescribe muscle relaxant drugs.These medicines help dull your pain so you can more quickly return to your normal activities and healthy exercise.  Apply ice to the injured area:  Put ice in a plastic bag.  Place a towel between your skin and the bag.  Leave the ice on for 20 minutes, 2-3 times a day for the first 2-3 days. After that, ice and heat may be alternated to reduce pain and spasms.  Maintain a healthy weight. Excess weight puts extra stress on your back and makes it difficult to maintain good posture. SEEK MEDICAL CARE IF:  You have pain that is not relieved with rest or medicine.  You have increasing pain going down into the legs or buttocks.  You have pain that does not improve in one week.  You have night pain.  You lose weight.  You have a fever or chills. SEEK IMMEDIATE MEDICAL CARE IF:   You develop new bowel or bladder control problems.  You have unusual weakness or numbness in your arms or legs.  You develop nausea or vomiting.  You develop abdominal pain.  You feel faint.   This information is not intended to replace advice given to you by your health care provider. Make sure you discuss any questions you have with your health care provider.   Document Released: 10/21/2005 Document Revised: 11/11/2014 Document Reviewed: 02/22/2014 Elsevier Interactive Patient Education 2016 Elsevier  Inc. Contusion A contusion is a deep bruise. Contusions are the result of a blunt injury to tissues and muscle fibers under the skin. The injury causes bleeding under the skin. The skin overlying the contusion may turn blue, purple, or yellow. Minor injuries will give you a painless contusion, but more severe contusions may stay painful and swollen for a few weeks.  CAUSES  This condition is usually caused by a blow, trauma, or direct force to an area of the body. SYMPTOMS    Symptoms of this condition include:  Swelling of the injured area.  Pain and tenderness in the injured area.  Discoloration. The area may have redness and then turn blue, purple, or yellow. DIAGNOSIS  This condition is diagnosed based on a physical exam and medical history. An X-ray, CT scan, or MRI may be needed to determine if there are any associated injuries, such as broken bones (fractures). TREATMENT  Specific treatment for this condition depends on what area of the body was injured. In general, the best treatment for a contusion is resting, icing, applying pressure to (compression), and elevating the injured area. This is often called the RICE strategy. Over-the-counter anti-inflammatory medicines may also be recommended for pain control.  HOME CARE INSTRUCTIONS   Rest the injured area.  If directed, apply ice to the injured area:  Put ice in a plastic bag.  Place a towel between your skin and the bag.  Leave the ice on for 20 minutes, 2-3 times per day.  If directed, apply light compression to the injured area using an elastic bandage. Make sure the bandage is not wrapped too tightly. Remove and reapply the bandage as directed by your health care provider.  If possible, raise (elevate) the injured area above the level of your heart while you are sitting or lying down.  Take over-the-counter and prescription medicines only as told by your health care provider. SEEK MEDICAL CARE IF:  Your symptoms do not improve after several days of treatment.  Your symptoms get worse.  You have difficulty moving the injured area. SEEK IMMEDIATE MEDICAL CARE IF:   You have severe pain.  You have numbness in a hand or foot.  Your hand or foot turns pale or cold.   This information is not intended to replace advice given to you by your health care provider. Make sure you discuss any questions you have with your health care provider.   Document Released: 07/31/2005 Document Revised:  07/12/2015 Document Reviewed: 03/08/2015 Elsevier Interactive Patient Education 2016 Elsevier Inc.  RICE for Routine Care of Injuries Theroutine careofmanyinjuriesincludes rest, ice, compression, and elevation (RICE therapy). RICE therapy is often recommended for injuries to soft tissues, such as a muscle strain, ligament injuries, bruises, and overuse injuries. It can also be used for some bony injuries. Using RICE therapy can help to relieve pain, lessen swelling, and enable your body to heal. Rest Rest is required to allow your body to heal. This usually involves reducing your normal activities and avoiding use of the injured part of your body. Generally, you can return to your normal activities when you are comfortable and have been given permission by your health care provider. Ice Icing your injury helps to keep the swelling down, and it lessens pain. Do not apply ice directly to your skin.  Put ice in a plastic bag.  Place a towel between your skin and the bag.  Leave the ice on for 20 minutes, 2-3 times a day. Do this for  as long as you are directed by your health care provider. Compression Compression means putting pressure on the injured area. Compression helps to keep swelling down, gives support, and helps with discomfort. Compression may be done with an elastic bandage. If an elastic bandage has been applied, follow these general tips:  Remove and reapply the bandage every 3-4 hours or as directed by your health care provider.  Make sure the bandage is not wrapped too tightly, because this can cut off circulation. If part of your body beyond the bandage becomes blue, numb, cold, swollen, or more painful, your bandage is most likely too tight. If this occurs, remove your bandage and reapply it more loosely.  See your health care provider if the bandage seems to be making your problems worse rather than better. Elevation Elevation means keeping the injured area raised. This  helps to lessen swelling and decrease pain. If possible, your injured area should be elevated at or above the level of your heart or the center of your chest. WHEN SHOULD I SEEK MEDICAL CARE? You should seek medical care if:  Your pain and swelling continue.  Your symptoms are getting worse rather than improving. These symptoms may indicate that further evaluation or further X-rays are needed. Sometimes, X-rays may not show a small broken bone (fracture) until a number of days later. Make a follow-up appointment with your health care provider. WHEN SHOULD I SEEK IMMEDIATE MEDICAL CARE? You should seek immediate medical care if:  You have sudden severe pain at or below the area of your injury.  You have redness or increased swelling around your injury.  You have tingling or numbness at or below the area of your injury that does not improve after you remove the elastic bandage.   This information is not intended to replace advice given to you by your health care provider. Make sure you discuss any questions you have with your health care provider.   Document Released: 02/02/2001 Document Revised: 07/12/2015 Document Reviewed: 09/28/2014 Elsevier Interactive Patient Education [employer]

## 2016-04-18 ENCOUNTER — Encounter (HOSPITAL_COMMUNITY): Payer: Self-pay | Admitting: Emergency Medicine

## 2016-04-18 ENCOUNTER — Emergency Department (HOSPITAL_COMMUNITY)
Admission: EM | Admit: 2016-04-18 | Discharge: 2016-04-19 | Disposition: A | Discharge status: Other Acute Inpatient Hospital | Payer: Self-pay | Attending: Emergency Medicine | Admitting: Emergency Medicine

## 2016-04-18 DIAGNOSIS — F329 Major depressive disorder, single episode, unspecified: Secondary | ICD-10-CM

## 2016-04-18 DIAGNOSIS — R45851 Suicidal ideations: Secondary | ICD-10-CM | POA: Insufficient documentation

## 2016-04-18 DIAGNOSIS — F10929 Alcohol use, unspecified with intoxication, unspecified: Secondary | ICD-10-CM

## 2016-04-18 DIAGNOSIS — F1410 Cocaine abuse, uncomplicated: Secondary | ICD-10-CM | POA: Insufficient documentation

## 2016-04-18 DIAGNOSIS — F419 Anxiety disorder, unspecified: Secondary | ICD-10-CM

## 2016-04-18 DIAGNOSIS — F418 Other specified anxiety disorders: Secondary | ICD-10-CM | POA: Insufficient documentation

## 2016-04-18 DIAGNOSIS — F32A Depression, unspecified: Secondary | ICD-10-CM

## 2016-04-18 DIAGNOSIS — F17210 Nicotine dependence, cigarettes, uncomplicated: Secondary | ICD-10-CM | POA: Insufficient documentation

## 2016-04-18 DIAGNOSIS — F10129 Alcohol abuse with intoxication, unspecified: Secondary | ICD-10-CM | POA: Insufficient documentation

## 2016-04-18 LAB — COMPREHENSIVE METABOLIC PANEL
ALBUMIN: 4.8 g/dL (ref 3.5–5.0)
ALT: 30 U/L (ref 17–63)
AST: 50 U/L — AB (ref 15–41)
Alkaline Phosphatase: 53 U/L (ref 38–126)
Anion gap: 12 (ref 5–15)
BUN: 11 mg/dL (ref 6–20)
CHLORIDE: 100 mmol/L — AB (ref 101–111)
CO2: 26 mmol/L (ref 22–32)
CREATININE: 1.08 mg/dL (ref 0.61–1.24)
Calcium: 9.4 mg/dL (ref 8.9–10.3)
GFR calc Af Amer: >60 mL/min (ref >60)
GFR calc non Af Amer: >60 mL/min (ref >60)
Glucose, Bld: 79 mg/dL (ref 65–99)
POTASSIUM: 4.6 mmol/L (ref 3.5–5.1)
SODIUM: 138 mmol/L (ref 135–145)
Total Bilirubin: 1.2 mg/dL (ref 0.3–1.2)
Total Protein: 8.3 g/dL — ABNORMAL HIGH (ref 6.5–8.1)

## 2016-04-18 LAB — CBC
HCT: 41.8 % (ref 39.0–52.0)
HEMOGLOBIN: 14.3 g/dL (ref 13.0–17.0)
MCH: 28.1 pg (ref 26.0–34.0)
MCHC: 34.2 g/dL (ref 30.0–36.0)
MCV: 82.3 fL (ref 78.0–100.0)
PLATELETS: 159 10*3/uL (ref 150–400)
RBC: 5.08 MIL/uL (ref 4.22–5.81)
RDW: 14.9 % (ref 11.5–15.5)
WBC: 6.2 10*3/uL (ref 4.0–10.5)

## 2016-04-18 LAB — RAPID URINE DRUG SCREEN, HOSP PERFORMED
AMPHETAMINES: NOT DETECTED
BENZODIAZEPINES: NOT DETECTED
Barbiturates: NOT DETECTED
Cocaine: POSITIVE — AB
OPIATES: NOT DETECTED
TETRAHYDROCANNABINOL: NOT DETECTED

## 2016-04-18 LAB — SALICYLATE LEVEL: Salicylate Lvl: <4 mg/dL (ref 2.8–30.0)

## 2016-04-18 LAB — ACETAMINOPHEN LEVEL: Acetaminophen (Tylenol), Serum: <10 ug/mL — ABNORMAL LOW (ref 10–30)

## 2016-04-18 LAB — ETHANOL: ALCOHOL ETHYL (B): 209 mg/dL — AB (ref <5)

## 2016-04-18 MED ORDER — IBUPROFEN 200 MG PO TABS: 400.0000 mg | ORAL_TABLET | Freq: Three times a day (TID) | ORAL | Status: DC | PRN

## 2016-04-18 MED ORDER — ACETAMINOPHEN 325 MG PO TABS
650.0000 mg | ORAL_TABLET | ORAL | Status: DC | PRN
  Administered 2016-04-18: 650 mg via ORAL
  Filled 2016-04-18: qty 2

## 2016-04-18 MED ORDER — LORAZEPAM 1 MG PO TABS
1.0000 mg | ORAL_TABLET | Freq: Three times a day (TID) | ORAL | Status: DC | PRN
  Administered 2016-04-18: 1 mg via ORAL
  Filled 2016-04-18: qty 1

## 2016-04-18 NOTE — BH Assessment (Signed)
Assessment completed. Consulted Alberteen SamFran Hobson, NP who recommend the observation unit. Informed Dr. Arizona ConstableStienl of the recommendation. [facility]

## 2016-04-18 NOTE — ED Notes (Signed)
Patient reports SI and ongoing depression. Rates anxiety and feelings of depression 10/10. States that he has racing thoughts and cannot sleep without alcohol. States that he has had therapy in the past and use to have RX for Seroquel and Abilify but self medicates with alcohol. Reports pain in feet post yard work today.   Encouragement offered. Snack provided. Ativan and Tylenol given. Encouraged to hydrate.  Q 15 safety checks in place.

## 2016-04-18 NOTE — ED Notes (Signed)
Patient states that he having depression and wanting detox from ETOH. Patient has SI by wanting to borrow a gun.  Patient states that today he was outside mowing and started to have burning/tingling in bilat feet.

## 2016-04-18 NOTE — BH Assessment (Addendum)
Pt has been accepted accepted to the Observation Unit Bed 3 (Dr.Kumar). Pt can be transported after midnight. Informed Dr. Denton LankSteinl of the disposition. [facility].

## 2016-04-18 NOTE — ED Notes (Signed)
Pt has cellphone(broke), lighter,and Advertising account plannerphone charger.  Has been seen and wanded by security.  One bag of belongings.

## 2016-04-18 NOTE — ED Provider Notes (Addendum)
CSN: [id number]     Arrival date & time 04/18/16  1902 History   First MD Initiated Contact with Patient 04/18/16 1912     Chief Complaint  Patient presents with  . detox    . bilat foot pain   . Suicidal     (Consider location/radiation/quality/duration/timing/severity/associated sxs/prior Treatment) The history is provided by the patient.  Patient with hx depression, anxiety, and etoh abuse, c/o daily etoh use/abuse x 1 month, and increasing anxiety, feelings of depression, and states has had transient thoughts of suicide.  Denies any current plan. Denies attempt to harm self.  States physical health at baseline, no recent illness, trauma or injury.  Is eating and drinking. Sleeps ok at night. Denies fever or chills. Denies hx complicated etoh withdrawal or dts.         Past Medical History  Diagnosis Date  . Alcohol abuse   . Depression   . Anxiety    Past Surgical History  Procedure Laterality Date  . Tonsillectomy     No family history on file. Social History  Substance Use Topics  . Smoking status: Current Every Day Smoker -- 1.00 packs/day    Types: Cigarettes  . Smokeless tobacco: Never Used  . Alcohol Use: 0.6 oz/week    1 Cans of beer per week     Comment: a case a day; pt told this writer 12 pk daily or a fifth of vodka    Review of Systems  Constitutional: Negative for fever.  HENT: Negative for sore throat.   Eyes: Negative for redness.  Respiratory: Negative for shortness of breath.   Cardiovascular: Negative for chest pain.  Gastrointestinal: Negative for vomiting, abdominal pain and diarrhea.  Genitourinary: Negative for flank pain.  Musculoskeletal: Negative for back pain and neck pain.  Skin: Negative for rash.  Neurological: Negative for headaches.  Hematological: Does not bruise/bleed easily.  Psychiatric/Behavioral: Positive for suicidal ideas and dysphoric mood. Negative for confusion. The patient is nervous/anxious.       Allergies   Shellfish allergy  Home Medications   Prior to Admission medications   Medication Sig Start Date End Date Taking? Authorizing Provider  ibuprofen (ADVIL,MOTRIN) 800 MG tablet Take 1 tablet (800 mg total) by mouth every 8 (eight) hours as needed for mild pain. Patient not taking: Reported on 04/18/2016 12/17/15   Kristen N Ward, DO   BP 126/95 mmHg  Pulse 105  Temp(Src) 98.7 F (37.1 C) (Oral)  Resp 19  SpO2 95% Physical Exam  Constitutional: He is oriented to person, place, and time. He appears well-developed and well-nourished. No distress.  HENT:  Mouth/Throat: Oropharynx is clear and moist.  Eyes: Conjunctivae are normal. Pupils are equal, round, and reactive to light. No scleral icterus.  Neck: Neck supple. No tracheal deviation present.  Cardiovascular: Normal rate, regular rhythm, normal heart sounds and intact distal pulses.   No murmur heard. Pulmonary/Chest: Effort normal and breath sounds normal. No accessory muscle usage. No respiratory distress.  Abdominal: Soft. Bowel sounds are normal. He exhibits no distension. There is no tenderness.  Musculoskeletal: Normal range of motion. He exhibits no edema.  Neurological: He is alert and oriented to person, place, and time.  No tremor or shakes. Steady gait.   Skin: Skin is warm and dry. No rash noted. He is not diaphoretic.  Psychiatric: He has a normal mood and affect.  Nursing note and vitals reviewed.   ED Course  Procedures (including critical care time) Labs Review  Results for orders placed or performed during the hospital encounter of 04/18/16  Comprehensive metabolic panel  Result Value Ref Range   Sodium 138 135 - 145 mmol/L   Potassium 4.6 3.5 - 5.1 mmol/L   Chloride 100 (L) 101 - 111 mmol/L   CO2 26 22 - 32 mmol/L   Glucose, Bld 79 65 - 99 mg/dL   BUN 11 6 - 20 mg/dL   Creatinine, Ser 1.611.08 0.61 - 1.24 mg/dL   Calcium 9.4 8.9 - 09.610.3 mg/dL   Total Protein 8.3 (H) 6.5 - 8.1 g/dL   Albumin 4.8 3.5 - 5.0  g/dL   AST 50 (H) 15 - 41 U/L   ALT 30 17 - 63 U/L   Alkaline Phosphatase 53 38 - 126 U/L   Total Bilirubin 1.2 0.3 - 1.2 mg/dL   GFR calc non Af Amer >60 >60 mL/min   GFR calc Af Amer >60 >60 mL/min   Anion gap 12 5 - 15  Ethanol  Result Value Ref Range   Alcohol, Ethyl (B) 209 (H) <5 mg/dL  Salicylate level  Result Value Ref Range   Salicylate Lvl <4.0 2.8 - 30.0 mg/dL  Acetaminophen level  Result Value Ref Range   Acetaminophen (Tylenol), Serum <10 (L) 10 - 30 ug/mL  cbc  Result Value Ref Range   WBC 6.2 4.0 - 10.5 K/uL   RBC 5.08 4.22 - 5.81 MIL/uL   Hemoglobin 14.3 13.0 - 17.0 g/dL   HCT 04.541.8 40.939.0 - 81.152.0 %   MCV 82.3 78.0 - 100.0 fL   MCH 28.1 26.0 - 34.0 pg   MCHC 34.2 30.0 - 36.0 g/dL   RDW 91.414.9 78.211.5 - 95.615.5 %   Platelets 159 150 - 400 K/uL  Rapid urine drug screen (hospital performed)  Result Value Ref Range   Opiates NONE DETECTED NONE DETECTED   Cocaine POSITIVE (A) NONE DETECTED   Benzodiazepines NONE DETECTED NONE DETECTED   Amphetamines NONE DETECTED NONE DETECTED   Tetrahydrocannabinol NONE DETECTED NONE DETECTED   Barbiturates NONE DETECTED NONE DETECTED     I have personally reviewed and evaluated these lab results as part of my medical decision-making.   MDM   Labs sent.  Reviewed nursing notes and prior charts for additional history.   BH team consulted.  Recheck, pt calm and alert.   [facility]   [facility] Dr Lucianne MussKumar.  Recheck, pt alert, content, nad. Appears stable for transfer.       Cathren LaineKevin Willaim Mode, MD 04/18/16 [phone]

## 2016-04-18 NOTE — ED Notes (Signed)
[id number] Expected date: 04/18/16 Expected time: 8:30 PM Means of arrival:  Comments: Hold Marina GoodellPerry, D.

## 2016-04-18 NOTE — BH Assessment (Addendum)
Tele Assessment Note   Chad Sanchez is an 45 y.o. male presenting to [facility] reporting suicidal ideation with a plan to shoot self. Pt is also requesting detox from alcohol and cocaine. Pt reported that he has been dealing with depression for a while. Pt stated "I just don't feel good about myself". Pt reported that he plan to borrow a gun from a friend to commit suicide. PT shared that he has attempted multiple times in the past. Pt did not report any self-injurious behaviors. Pt is reporting multiple depressive symptoms and stated "life is a stressor". Pt reported that his sleep and appetite have been poor. Pt denies HI and AVH at this time. Pt did not report any pending criminal charges or upcoming court dates.  Pt reported that he drinks alcohol daily and reported that he uses cocaine every other day. Pt did not report any current mental health treatment and shared that he has been inpatient several times in the past. Pt did not report any physical, sexual or emotional abuse at this time.    Diagnosis: Alcohol-induced depressive disorder, with moderate or severe use disorder   Past Medical History:  Past Medical History  Diagnosis Date  . Alcohol abuse   . Depression   . Anxiety     Past Surgical History  Procedure Laterality Date  . Tonsillectomy      Family History: No family history on file.  Social History:  reports that he has been smoking Cigarettes.  He has been smoking about 1.00 pack per day. He has never used smokeless tobacco. He reports that he drinks about 0.6 oz of alcohol per week. He reports that he uses illicit drugs ("Crack" cocaine and Cocaine).  Additional Social History:  Alcohol / Drug Use History of alcohol / drug use?: Yes Longest period of sobriety (when/how long): 2 years  Negative Consequences of Use: Personal relationships, Surveyor, quantityinancial, Work / School Withdrawal Symptoms: Tremors Substance #1 Name of Substance 1: Alcohol  1 - Age of First Use: 13 1 -  Amount (size/oz): " as much as I can get"  1 - Frequency: daily  1 - Duration: 30 days  1 - Last Use / Amount: 04-18-16 Substance #2 Name of Substance 2: cocaine  2 - Age of First Use: 31 2 - Amount (size/oz): <1 gram 2 - Frequency: 3-4x weekly  2 - Duration: ongoing  2 - Last Use / Amount: 04-18-16  CIWA: CIWA-Ar BP: 126/95 mmHg Pulse Rate: 105 COWS:    PATIENT STRENGTHS: (choose at least two) Average or above average intelligence Motivation for treatment/growth    Allergies:  Allergies  Allergen Reactions  . Shellfish Allergy Anaphylaxis    All seafood    Home Medications:  (Not in a hospital admission)  OB/GYN Status:  No LMP for male patient.  General Assessment Data Location of Assessment: [facility] TTS Assessment: In system Is this a Tele or Face-to-Face Assessment?: Face-to-Face Is this an Initial Assessment or a Re-assessment for this encounter?: Initial Assessment Marital status: Single Living Arrangements: Alone Can pt return to current living arrangement?: Yes Admission Status: Voluntary Is patient capable of signing voluntary admission?: Yes Referral Source: Self/Family/Friend Insurance type: Self-Pay     Crisis Care Plan Living Arrangements: Alone Name of Psychiatrist: None  Name of Therapist: None   Education Status Is patient currently in school?: No Current Grade: N/A Highest grade of school patient has completed: Some college Name of school: N/A Contact person: N/A  Risk to self  with the past 6 months Suicidal Ideation: Yes-Currently Present Has patient been a risk to self within the past 6 months prior to admission? : Yes Suicidal Intent: Yes-Currently Present Has patient had any suicidal intent within the past 6 months prior to admission? : Yes Is patient at risk for suicide?: Yes Suicidal Plan?: Yes-Currently Present Has patient had any suicidal plan within the past 6 months prior to admission? : Yes Specify Current Suicidal Plan:  "borrow a gun from friend and shoot self" Access to Means: Yes Specify Access to Suicidal Means: pt reported that he can borrow a gun from a friend.  What has been your use of drugs/alcohol within the last 12 months?: Alcohol and cocaine use reported. ' Previous Attempts/Gestures: Yes How many times?:  (multiple') Other Self Harm Risks: Pt denies Triggers for Past Attempts: Unpredictable, Other (Comment) (substance use) Family Suicide History: Unknown Recent stressful life event(s): Other (Comment) ("just life in general") Persecutory voices/beliefs?: No Depression: Yes Depression Symptoms: Despondent, Insomnia, Tearfulness, Isolating, Fatigue, Guilt, Loss of interest in usual pleasures, Feeling worthless/self pity, Feeling angry/irritable Substance abuse history and/or treatment for substance abuse?: Yes Suicide prevention information given to non-admitted patients: Not applicable  Risk to Others within the past 6 months Homicidal Ideation: No Does patient have any lifetime risk of violence toward others beyond the six months prior to admission? : No Thoughts of Harm to Others: No Current Homicidal Intent: No Current Homicidal Plan: No Access to Homicidal Means: No Identified Victim: N/A History of harm to others?: No Assessment of Violence: None Noted Violent Behavior Description: No violent behaviors observed. Pt is calm and cooperative at this time.  Does patient have access to weapons?: Yes (Comment) (pt reported that he can borrow a gun from a friend. ) Criminal Charges Pending?: No Does patient have a court date: No Is patient on probation?: No  Psychosis Hallucinations: None noted Delusions: None noted  Mental Status Report Appearance/Hygiene: In scrubs Eye Contact: Fair Motor Activity: Freedom of movement Speech: Logical/coherent Level of Consciousness: Quiet/awake Mood: Depressed Affect: Appropriate to circumstance Anxiety Level: Minimal Thought Processes:  Coherent, Relevant Judgement: Partial Orientation: Person, Place, Time, Appropriate for developmental age, Situation Obsessive Compulsive Thoughts/Behaviors: None  Cognitive Functioning Concentration: Fair Memory: Recent Intact, Remote Intact IQ: Average Insight: Fair Impulse Control: Fair Appetite: Poor ("no food in 2 days" due to drinking) Weight Loss: 0 Weight Gain: 0 Sleep: Decreased Total Hours of Sleep: 4 (pt reported that he only sleeps when intoxicated) Vegetative Symptoms: Staying in bed, Decreased grooming  ADLScreening [facility] Assessment Services) Patient's cognitive ability adequate to safely complete daily activities?: Yes Patient able to express need for assistance with ADLs?: Yes Independently performs ADLs?: Yes (appropriate for developmental age)  Prior Inpatient Therapy Prior Inpatient Therapy: Yes Prior Therapy Dates: 2011, 2014, 2015 Prior Therapy Facilty/Provider(s): [facility] Reason for Treatment: Substance abuse, depression   Prior Outpatient Therapy Prior Outpatient Therapy: No Does patient have an ACCT team?: No Does patient have Intensive In-House Services?  : No Does patient have [facility] services? : No Does patient have P4CC services?: No  ADL Screening (condition at time of admission) Patient's cognitive ability adequate to safely complete daily activities?: Yes Is the patient deaf or have difficulty hearing?: No Does the patient have difficulty seeing, even when wearing glasses/contacts?: No Does the patient have difficulty concentrating, remembering, or making decisions?: No Patient able to express need for assistance with ADLs?: Yes Does the patient have difficulty dressing or bathing?: No Independently performs  ADLs?: Yes (appropriate for developmental age)       Abuse/Neglect Assessment (Assessment to be complete while patient is alone) Physical Abuse: Denies Verbal Abuse: Denies Sexual Abuse: Denies Exploitation of  patient/patient's resources: Denies Self-Neglect: Denies     Merchant navy officer (For Healthcare) Does patient have an advance directive?: No Would patient like information on creating an advanced directive?: No - patient declined information    Additional Information 1:1 In Past 12 Months?: No CIRT Risk: No Elopement Risk: No Does patient have medical clearance?: No (Labs pending )     Disposition:  Disposition Initial Assessment Completed for this Encounter: Yes Disposition of Patient: Other dispositions (OBS )  Timeka Goette S 04/18/2016 8:28 PM

## 2016-04-18 NOTE — Discharge Instructions (Signed)
[facility]

## 2016-04-19 ENCOUNTER — Encounter (HOSPITAL_COMMUNITY): Payer: Self-pay | Admitting: Emergency Medicine

## 2016-04-19 ENCOUNTER — Observation Stay (HOSPITAL_COMMUNITY)
Admission: AD | Admit: 2016-04-19 | Discharge: 2016-04-20 | Disposition: A | Discharge status: Home / Self Care | Payer: Federal, State, Local not specified - Other | Source: Intra-hospital | Attending: Psychiatry | Admitting: Psychiatry

## 2016-04-19 DIAGNOSIS — Z9114 Patient's other noncompliance with medication regimen: Secondary | ICD-10-CM | POA: Insufficient documentation

## 2016-04-19 DIAGNOSIS — F411 Generalized anxiety disorder: Secondary | ICD-10-CM | POA: Insufficient documentation

## 2016-04-19 DIAGNOSIS — F1094 Alcohol use, unspecified with alcohol-induced mood disorder: Secondary | ICD-10-CM | POA: Diagnosis present

## 2016-04-19 DIAGNOSIS — F1410 Cocaine abuse, uncomplicated: Secondary | ICD-10-CM | POA: Insufficient documentation

## 2016-04-19 DIAGNOSIS — F1024 Alcohol dependence with alcohol-induced mood disorder: Secondary | ICD-10-CM | POA: Insufficient documentation

## 2016-04-19 DIAGNOSIS — Z91013 Allergy to seafood: Secondary | ICD-10-CM | POA: Insufficient documentation

## 2016-04-19 DIAGNOSIS — F329 Major depressive disorder, single episode, unspecified: Principal | ICD-10-CM | POA: Insufficient documentation

## 2016-04-19 DIAGNOSIS — R45851 Suicidal ideations: Secondary | ICD-10-CM | POA: Insufficient documentation

## 2016-04-19 MED ORDER — LOPERAMIDE HCL 2 MG PO CAPS: 2.0000 mg | ORAL_CAPSULE | ORAL | Status: DC | PRN

## 2016-04-19 MED ORDER — ADULT MULTIVITAMIN W/MINERALS CH
1.0000 | ORAL_TABLET | Freq: Every day | ORAL | Status: DC
  Administered 2016-04-19 – 2016-04-20 (×2): 1 via ORAL
  Filled 2016-04-19 (×2): qty 1

## 2016-04-19 MED ORDER — VITAMIN B-1 100 MG PO TABS
100.0000 mg | ORAL_TABLET | Freq: Every day | ORAL | Status: DC
Start: 2016-04-20 — End: 2016-04-20
  Administered 2016-04-20: 100 mg via ORAL
  Filled 2016-04-19: qty 1

## 2016-04-19 MED ORDER — ONDANSETRON 4 MG PO TBDP: 4.0000 mg | ORAL_TABLET | Freq: Four times a day (QID) | ORAL | Status: DC | PRN

## 2016-04-19 MED ORDER — TRAZODONE HCL 50 MG PO TABS
50.0000 mg | ORAL_TABLET | Freq: Every evening | ORAL | Status: DC | PRN
  Administered 2016-04-19: 50 mg via ORAL
  Filled 2016-04-19 (×3): qty 1

## 2016-04-19 MED ORDER — LORAZEPAM 1 MG PO TABS: 1.0000 mg | ORAL_TABLET | Freq: Four times a day (QID) | ORAL | Status: DC | PRN

## 2016-04-19 MED ORDER — THIAMINE HCL 100 MG/ML IJ SOLN: 100.0000 mg | Freq: Once | INTRAMUSCULAR | Status: DC

## 2016-04-19 MED ORDER — LORAZEPAM 1 MG PO TABS
1.0000 mg | ORAL_TABLET | Freq: Four times a day (QID) | ORAL | Status: AC
  Administered 2016-04-19 (×4): 1 mg via ORAL
  Filled 2016-04-19 (×4): qty 1

## 2016-04-19 MED ORDER — LORAZEPAM 1 MG PO TABS: 1.0000 mg | ORAL_TABLET | Freq: Every day | ORAL | Status: DC

## 2016-04-19 MED ORDER — ACETAMINOPHEN 325 MG PO TABS: 650.0000 mg | ORAL_TABLET | ORAL | Status: DC | PRN

## 2016-04-19 MED ORDER — LORAZEPAM 1 MG PO TABS: 1.0000 mg | ORAL_TABLET | Freq: Two times a day (BID) | ORAL | Status: DC

## 2016-04-19 MED ORDER — SERTRALINE HCL 50 MG PO TABS
50.0000 mg | ORAL_TABLET | Freq: Every day | ORAL | Status: DC
  Administered 2016-04-19 – 2016-04-20 (×2): 50 mg via ORAL
  Filled 2016-04-19 (×2): qty 1
  Filled 2016-04-19: qty 3

## 2016-04-19 MED ORDER — HYDROXYZINE HCL 25 MG PO TABS: 25.0000 mg | ORAL_TABLET | Freq: Four times a day (QID) | ORAL | Status: DC | PRN

## 2016-04-19 MED ORDER — LORAZEPAM 1 MG PO TABS
1.0000 mg | ORAL_TABLET | Freq: Three times a day (TID) | ORAL | Status: DC
  Administered 2016-04-20 (×2): 1 mg via ORAL
  Filled 2016-04-19 (×2): qty 1

## 2016-04-19 NOTE — Progress Notes (Signed)
D: Patient seen lying quietly in his bed at the time of shift change. Patient denies pain, SI, AH/VH at this time. Endorses depression of 8/10. Minimal conversation. Reports mild tremor but states "I know it's gonna get worse tomorrow". No behavioral issues noted.  A: Staff offered support and encouragement. meds given as ordered. Constant monitoring and observation for safety and stability.  R: Patient remains safe.

## 2016-04-19 NOTE — H&P (Signed)
[facility] Provider Admission PAA/H&P  Patient Identification: Chad Sanchez MRN:  [id number] Date of Evaluation:  04/19/2016 Chief Complaint:  Patient states "I drink all day until I pass out."  Principal Diagnosis: Alcohol-induced mood disorder ([place]) Diagnosis:   Patient Active Problem List   Diagnosis Date Noted  . Alcohol-induced mood disorder ([place]) [F10.94] 12/16/2015  . Alcohol dependence with uncomplicated withdrawal ([place]) [F10.230] 12/16/2015  . GAD (generalized anxiety disorder) [F41.1] 01/14/2014   History of Present Illness:   Chad Sanchez is an 45 y.o. male presenting to [place] reporting suicidal ideation with a plan to shoot self. Patient is also requesting detox from alcohol and cocaine. Patient reported that he has been dealing with depression for a while. Patient reported that he plan to borrow a gun from a friend to commit suicide. Patient also reports that he has attempted multiple times in the past.  Patient is reporting multiple depressive symptoms such as decreased energy and hopelessness.  Patient reported that his sleep and appetite have been poor. Patient denies HI and AVH during assessment.  Patient did not report any pending criminal charges or upcoming court dates. Patient reported that he drinks alcohol daily and reported that he uses cocaine every other day. Patient did not report any current mental health treatment and shared that he has been inpatient several times in the past. Patient did not report any physical, sexual or emotional abuse. He stated during assessment "I drink until I pass out. When I try to stop I get the shakes and end up drinking again. It's been this bad for about a month. I was at [facility] for two years from 2009-2011. I was sober for six months after that. I'm not having the suicidal thoughts as much. It still comes and goes. I am starting to have withdrawal symptoms like I feel very sweaty. I have been on psychiatric medication  before but never stayed on it more than a week. I would like to try an antidepressant again."  Associated Signs/Symptoms: Depression Symptoms:  depressed mood, anhedonia, psychomotor retardation, fatigue, feelings of worthlessness/guilt, difficulty concentrating, hopelessness, recurrent thoughts of death, suicidal thoughts with specific plan, anxiety, loss of energy/fatigue, disturbed sleep, (Hypo) Manic Symptoms:  Denies Anxiety Symptoms:  Excessive Worry, Psychotic Symptoms:  Denies PTSD Symptoms: Negative Total Time spent with patient: 45 minutes  Past Psychiatric History: Alcohol induced mood disorder   Is the patient at risk to self? Yes.    Has the patient been a risk to self in the past 6 months? Yes.    Has the patient been a risk to self within the distant past? Yes.    Is the patient a risk to others? No.  Has the patient been a risk to others in the past 6 months? No.  Has the patient been a risk to others within the distant past? No.   Prior Inpatient Therapy:   Yes Prior Outpatient Therapy:  Yes  Alcohol Screening: 1. How often do you have a drink containing alcohol?: 4 or more times a week 2. How many drinks containing alcohol do you have on a typical day when you are drinking?: 5 or 6 3. How often do you have six or more drinks on one occasion?: Daily or almost daily Preliminary Score: 6 4. How often during the last year have you found that you were not able to stop drinking once you had started?: Daily or almost daily 5. How often during the last year have you failed  to do what was normally expected from you becasue of drinking?: Daily or almost daily 6. How often during the last year have you needed a first drink in the morning to get yourself going after a heavy drinking session?: Daily or almost daily 7. How often during the last year have you had a feeling of guilt of remorse after drinking?: Daily or almost daily 8. How often during the last year have  you been unable to remember what happened the night before because you had been drinking?: Monthly 9. Have you or someone else been injured as a result of your drinking?: No 10. Has a relative or friend or a doctor or another health worker been concerned about your drinking or suggested you cut down?: Yes, during the last year Alcohol Use Disorder Identification Test Final Score (AUDIT): 32 Substance Abuse History in the last 12 months:  No. Consequences of Substance Abuse: Withdrawal Symptoms:   Diaphoresis Tremors Previous Psychotropic Medications: Yes Zoloft, Abilify, Seroquel, Trazodone  Psychological Evaluations: No  Past Medical History:  Past Medical History  Diagnosis Date  . Alcohol abuse   . Depression   . Anxiety     Past Surgical History  Procedure Laterality Date  . Tonsillectomy     Family History: History reviewed. No pertinent family history. Family Psychiatric History: Patient stated "My mother died of alcohol abuse." Tobacco Screening: One half pack per day.  Social History:  History  Alcohol Use  . 0.6 oz/week  . 1 Cans of beer per week    Comment: a case a day; pt told this writer 12 pk daily or a fifth of vodka     History  Drug Use  . Yes  . Special: "Crack" cocaine, Cocaine    Comment: Pt endorses using crack cocaine    Additional Social History:                           Allergies:   Allergies  Allergen Reactions  . Shellfish Allergy Anaphylaxis    All seafood   Lab Results:  Results for orders placed or performed during the hospital encounter of 04/18/16 (from the past 48 hour(s))  Comprehensive metabolic panel     Status: Abnormal   Collection Time: 04/18/16  7:39 PM  Result Value Ref Range   Sodium 138 135 - 145 mmol/L   Potassium 4.6 3.5 - 5.1 mmol/L   Chloride 100 (L) 101 - 111 mmol/L   CO2 26 22 - 32 mmol/L   Glucose, Bld 79 65 - 99 mg/dL   BUN 11 6 - 20 mg/dL   Creatinine, Ser 1.08 0.61 - 1.24 mg/dL   Calcium 9.4 8.9  - 10.3 mg/dL   Total Protein 8.3 (H) 6.5 - 8.1 g/dL   Albumin 4.8 3.5 - 5.0 g/dL   AST 50 (H) 15 - 41 U/L   ALT 30 17 - 63 U/L   Alkaline Phosphatase 53 38 - 126 U/L   Total Bilirubin 1.2 0.3 - 1.2 mg/dL   GFR calc non Af Amer >60 >60 mL/min   GFR calc Af Amer >60 >60 mL/min    Comment: (NOTE) The eGFR has been calculated using the CKD EPI equation. This calculation has not been validated in all clinical situations. eGFR's persistently <60 mL/min signify possible Chronic Kidney Disease.    Anion gap 12 5 - 15  Ethanol     Status: Abnormal   Collection Time: 04/18/16  7:39  PM  Result Value Ref Range   Alcohol, Ethyl (B) 209 (H) <5 mg/dL    Comment:        LOWEST DETECTABLE LIMIT FOR SERUM ALCOHOL IS 5 mg/dL FOR MEDICAL PURPOSES ONLY   Salicylate level     Status: None   Collection Time: 04/18/16  7:39 PM  Result Value Ref Range   Salicylate Lvl <1.6 2.8 - 30.0 mg/dL  Acetaminophen level     Status: Abnormal   Collection Time: 04/18/16  7:39 PM  Result Value Ref Range   Acetaminophen (Tylenol), Serum <10 (L) 10 - 30 ug/mL    Comment:        THERAPEUTIC CONCENTRATIONS VARY SIGNIFICANTLY. A RANGE OF 10-30 ug/mL MAY BE AN EFFECTIVE CONCENTRATION FOR MANY PATIENTS. HOWEVER, SOME ARE BEST TREATED AT CONCENTRATIONS OUTSIDE THIS RANGE. ACETAMINOPHEN CONCENTRATIONS >150 ug/mL AT 4 HOURS AFTER INGESTION AND >50 ug/mL AT 12 HOURS AFTER INGESTION ARE OFTEN ASSOCIATED WITH TOXIC REACTIONS.   cbc     Status: None   Collection Time: 04/18/16  7:39 PM  Result Value Ref Range   WBC 6.2 4.0 - 10.5 K/uL   RBC 5.08 4.22 - 5.81 MIL/uL   Hemoglobin 14.3 13.0 - 17.0 g/dL   HCT 41.8 39.0 - 52.0 %   MCV 82.3 78.0 - 100.0 fL   MCH 28.1 26.0 - 34.0 pg   MCHC 34.2 30.0 - 36.0 g/dL   RDW 14.9 11.5 - 15.5 %   Platelets 159 150 - 400 K/uL  Rapid urine drug screen (hospital performed)     Status: Abnormal   Collection Time: 04/18/16  7:40 PM  Result Value Ref Range   Opiates NONE  DETECTED NONE DETECTED   Cocaine POSITIVE (A) NONE DETECTED   Benzodiazepines NONE DETECTED NONE DETECTED   Amphetamines NONE DETECTED NONE DETECTED   Tetrahydrocannabinol NONE DETECTED NONE DETECTED   Barbiturates NONE DETECTED NONE DETECTED    Comment:        DRUG SCREEN FOR MEDICAL PURPOSES ONLY.  IF CONFIRMATION IS NEEDED FOR ANY PURPOSE, NOTIFY LAB WITHIN 5 DAYS.        LOWEST DETECTABLE LIMITS FOR URINE DRUG SCREEN Drug Class       Cutoff (ng/mL) Amphetamine      1000 Barbiturate      200 Benzodiazepine   073 Tricyclics       710 Opiates          300 Cocaine          300 THC              50     Blood Alcohol level:  Lab Results  Component Value Date   ETH 209* 04/18/2016   ETH 153* 62/69/4854    Metabolic Disorder Labs:  No results found for: HGBA1C, MPG No results found for: PROLACTIN No results found for: CHOL, TRIG, HDL, CHOLHDL, VLDL, LDLCALC  Current Medications: Current Facility-Administered Medications  Medication Dose Route Frequency Provider Last Rate Last Dose  . acetaminophen (TYLENOL) tablet 650 mg  650 mg Oral Q4H PRN Lurena Nida, NP      . hydrOXYzine (ATARAX/VISTARIL) tablet 25 mg  25 mg Oral Q6H PRN Lurena Nida, NP      . loperamide (IMODIUM) capsule 2-4 mg  2-4 mg Oral PRN Lurena Nida, NP      . LORazepam (ATIVAN) tablet 1 mg  1 mg Oral QID Lurena Nida, NP   1 mg at 04/19/16 1212  Followed by  . [START ON 04/20/2016] LORazepam (ATIVAN) tablet 1 mg  1 mg Oral TID Lurena Nida, NP       Followed by  . [START ON 04/21/2016] LORazepam (ATIVAN) tablet 1 mg  1 mg Oral BID Lurena Nida, NP       Followed by  . [START ON 04/22/2016] LORazepam (ATIVAN) tablet 1 mg  1 mg Oral Daily Lurena Nida, NP      . LORazepam (ATIVAN) tablet 1 mg  1 mg Oral Q6H PRN Lurena Nida, NP      . multivitamin with minerals tablet 1 tablet  1 tablet Oral Daily Lurena Nida, NP   1 tablet at 04/19/16 0732  . ondansetron (ZOFRAN-ODT) disintegrating tablet 4 mg  4  mg Oral Q6H PRN Lurena Nida, NP      . sertraline (ZOLOFT) tablet 50 mg  50 mg Oral Daily Niel Hummer, NP      . thiamine (B-1) injection 100 mg  100 mg Intramuscular Once Lurena Nida, NP   100 mg at 04/19/16 0341  . [START ON 04/20/2016] thiamine (VITAMIN B-1) tablet 100 mg  100 mg Oral Daily Lurena Nida, NP       PTA Medications: Prescriptions prior to admission  Medication Sig Dispense Refill Last Dose  . ibuprofen (ADVIL,MOTRIN) 800 MG tablet Take 1 tablet (800 mg total) by mouth every 8 (eight) hours as needed for mild pain. (Patient not taking: Reported on 04/18/2016) 30 tablet 0 Completed Course at Unknown time    Musculoskeletal: Strength & Muscle Tone: within normal limits Gait & Station: normal Patient leans: N/A  Psychiatric Specialty Exam: Physical Exam  Review of Systems  Constitutional: Positive for diaphoresis.  Neurological: Positive for tremors.  Psychiatric/Behavioral: Positive for depression, suicidal ideas and substance abuse. Negative for hallucinations and memory loss. The patient is nervous/anxious and has insomnia.     Blood pressure 129/80, pulse 78, temperature 97.9 F (36.6 C), temperature source Oral, resp. rate 18, height 6' 1.5" (1.867 m), weight 70.761 kg (156 lb), SpO2 100 %.Body mass index is 20.3 kg/(m^2).  General Appearance: Casual  Eye Contact:  Good  Speech:  Clear and Coherent  Volume:  Decreased  Mood:  Dysphoric  Affect:  Flat  Thought Process:  Coherent and Goal Directed  Orientation:  Full (Time, Place, and Person)  Thought Content:  Symptoms, worries, concerns  Suicidal Thoughts:  Yes.  without intent/plan  Homicidal Thoughts:  No  Memory:  Immediate;   Good Recent;   Good Remote;   Good  Judgement:  Impaired  Insight:  Shallow  Psychomotor Activity:  Decreased  Concentration:  Concentration: Good and Attention Span: Good  Recall:  Good  Fund of Knowledge:  Good  Language:  Good  Akathisia:  No  Handed:  Right  AIMS (if  indicated):     Assets:  Communication Skills Desire for Improvement Leisure Time Physical Health Resilience  ADL's:  Intact  Cognition:  WNL  Sleep:         Treatment Plan Summary: Daily contact with patient to assess and evaluate symptoms and progress in treatment and Medication management  Observation Level/Precautions:  Continuous Observation Laboratory:  CBC Chemistry Profile UDS ETOH Psychotherapy:  Individual for substance abuse counseling  Medications:  Start Zoloft 50 mg daily for depressive symptoms, Ativan taper for alcohol detox  Consultations:  None Discharge Concerns:  Continued substance abuse, medication non-compliance  Estimated LOS: 24-48 hours Other:  Referral for homeless shelter, substance abuse treatment     DAVIS, LAURA, NP 6/16/20172:27 PM I agree with NP note and assessment as above

## 2016-04-19 NOTE — Progress Notes (Signed)
Patient ID: Chad Sanchez, male   DOB: [birth year], 45 y.o.   MRN: [id number] Idalia Needleaige in [facility] notified writer that [facility] is full for tonight but may be a bed tomorrow to check back then. For tonight he is going to stay in OBS. He was notified he would be spending the night and he is fine with it. He has been cooperative and pleasant. He is able to contract for safety, and his CIWA was 3 tonight at meal time.

## 2016-04-19 NOTE — Progress Notes (Signed)
[facility] with a plan to shoot self. Patient states he wants help to  Detox off alcohol as well. Patient denies HI and AVH. Patient calm and cooperative. Patient given food and water to drink. Patient oriented to unit and monitoring of patient continued for safety. Patient remains safe on unit.

## 2016-04-19 NOTE — Progress Notes (Signed)
[facility] INPATIENT:  Family/Significant Other Suicide Prevention Education  Suicide Prevention Education:  Patient Refusal for Family/Significant Other Suicide Prevention Education: The patient Chad Sanchez has refused to provide written consent for family/significant other to be provided Family/Significant Other Suicide Prevention Education during admission and/or prior to discharge.    Curly RimBarbara B Elvi Leventhal 04/19/2016, 3:05 AM

## 2016-04-19 NOTE — [facility] Counselor (Addendum)
Per [name] at [facility], their beds have filled up for the night. TTS should call in am to see if any beds become available then.  [name], [place] Therapeutic Triage Specialist [phone]   [facility].  [name], [place]  Therapeutic Triage Specialist [phone]

## 2016-04-19 NOTE — Progress Notes (Addendum)
D: Patient has been resting quietly in bed.  He endorses passive SI and contracts for safety.  He denies HI/AVH.  Patient complains of some pain in his feet from recent yard work.  He has been treated for substance abuse in the past.  He would like to go to a long term treatment facility.  He is on the ativan protocol and is tolerating well.  Patient has little support in the area states, "they kinda distance themselves from me when I'm drinking."  He has been staying [place] friend here in [place] until he secures a bed at [facility].  He rates his depression 10/10; hopelessness 10/10; anxiety 9/10. A: Continue to monitor medication management and MD orders.  Patient remains safe in OBS unit.  Offer support and encouragement as needed. R: Patient's behavior is appropriate.

## 2016-04-19 NOTE — Progress Notes (Signed)
Patient ID: Chad Sanchez, male   DOB: 03/19/1971, 45 y.o.   MRN: [id number] Approached writer to check on what our plans for him are. He is able to contract for safey and says if he were in a safe place like a detox program he is not suicidal. He is just tired of living the way he has been. Will discuss making a referral to [facility].

## 2016-04-19 NOTE — [facility] Counselor (Signed)
Writer called called [facility] - Bertha reports they don't have beds and won't have beds until Mon. Writer left voicemail for [facility] [phone] re: setting up phone interview with pt for possible bed. Writer lefts OBS number [phone]. Writer updated OBS MH Tech and OBS NP.   Henryk Ursin Paige Demarri Elie, LCSWA Therapeutic Triage Specialist  

## 2016-04-20 DIAGNOSIS — F1094 Alcohol use, unspecified with alcohol-induced mood disorder: Secondary | ICD-10-CM | POA: Diagnosis not present

## 2016-04-20 MED ORDER — ADULT MULTIVITAMIN W/MINERALS CH: 1.0000 | ORAL_TABLET | Freq: Every day | ORAL | Status: DC

## 2016-04-20 MED ORDER — SERTRALINE HCL 50 MG PO TABS: 50.0000 mg | ORAL_TABLET | Freq: Every day | ORAL | Status: DC

## 2016-04-20 MED ORDER — TRAZODONE HCL 50 MG PO TABS
50.0000 mg | ORAL_TABLET | Freq: Every evening | ORAL | Status: DC | PRN
  Filled 2016-04-20 (×2): qty 3

## 2016-04-20 MED ORDER — TRAZODONE HCL 50 MG PO TABS: 50.0000 mg | ORAL_TABLET | Freq: Every evening | ORAL | Status: DC | PRN

## 2016-04-20 NOTE — Discharge Summary (Signed)
  Chad Sanchez is an 45 y.o. male presenting to [facility] reporting suicidal ideation with a plan to shoot self. Patient is also requesting detox from alcohol and cocaine. Patient reported that he has been dealing with depression for a while. Patient reported that he plan to borrow a gun from a friend to commit suicide. Patient also reports that he has attempted multiple times in the past. Patient is reporting multiple depressive symptoms such as decreased energy and hopelessness. Patient reported that his sleep and appetite have been poor. Patient denies HI and AVH during assessment. Patient did not report any pending criminal charges or upcoming court dates. Patient reported that he drinks alcohol daily and reported that he uses cocaine every other day. Patient did not report any current mental health treatment and shared that he has been inpatient several times in the past. Patient did not report any physical, sexual or emotional abuse. He stated during assessment "I drink until I pass out. When I try to stop I get the shakes and end up drinking again. It's been this bad for about a month. [facility]. I was sober for six months after that. I'm not having the suicidal thoughts as much. It still comes and goes. I am starting to have withdrawal symptoms like I feel very sweaty. I have been on psychiatric medication before but never stayed on it more than a week. I would like to try an antidepressant again."  [facility]. He was started on zoloft 50 mg daily to address his underlying depressive symptoms. Patient per CIWA scores showed minimal withdrawal symptoms from previously reported alcohol use. During assessment on 04/20/2016 the patient denied any active suicidal ideation. Patient called [facility] to verify that a bed was available for him. The counselor also verified that they offer services to help with  substance abuse. Patient was provided with a prescription for zoloft and a three day supply per the pharmacy. He left [facility] in stable condition with all belongings returned to him.   Patient seen by Fransisca KaufmannLaura Davis.  I have reviewed notes and agree with plan.

## 2016-04-20 NOTE — Progress Notes (Signed)
Patient is resting in recliner. Upon assessment, he is was slightly diaphoretic and having mild tremors. He rated his depression a 10, anxiety an 8, and stated that he felt overall hopelessness. Patient denied SI , HI, and AVH. Patient is calm and cooperative.  Patient remains safe with constant observation and medications administered. Patient offered support, encouragement, and education.   Patient is receptive and compliant, will continue to monitor.

## 2016-04-20 NOTE — Discharge Instructions (Signed)
You are encouraged to follow up with [facility] and continuation of care with substance abuse support offered by [facility] as you have expressed interest/ preference for. Also, you are encouraged to follow up with [facility] services for medication management and continuation of care. For further assistance you are encouraged to follow up with [facility] for additional outpatient substance abuse treatment.

## 2016-04-20 NOTE — Progress Notes (Signed)
Patient received belongings, medication samples, AVS worksheet, and scripts.  Patient signed belongings sheet and duplicate AVS. Offered education and opportunity to answer questions.  Patient d/c'd at [phone]

## 2016-04-20 NOTE — [facility] Counselor (Signed)
Pt. Not yet awake to speak with as of yet. Contacted [facility], Per Tim  No bed availability today. [facility], no admissions for [facility] Inpt. On weekends.  Roisin Mones K. Sherlon HandingHarris, LCAS-A, LPC-A, [facility]  Counselor 04/20/2016 9:22 AM

## 2016-04-20 NOTE — [facility] Counselor (Signed)
Spoke with pt. And consulted with Vernona RiegerLaura, NP regarding pt. Disposition. Pt states requests services from [facility]. This counselor contacted [facility] and they have available shelter 24/7. Registration is Monday -Friday and was explained to pt. Pt also requests to receive help with [facility] Through group, and classes for substance abuse and is pt. Preference.  Pt . Expressed no SI/HI and was explained due for d/c via Vernona RiegerLaura , NP and this counselor.   Pt due for d/c today with resources to [facility] and given other shelter services handout followed by outpt. Resources on d/c papers to [facility] up with [facility] for med. mngmnt. And [facility] as additional resource for outpt. substance abuse treatment.  Kileigh Ortmann K. Sherlon HandingHarris, LCAS-A, LPC-A, [facility]  Counselor 04/20/2016 12:40 PM

## 2016-05-03 ENCOUNTER — Emergency Department (HOSPITAL_COMMUNITY)
Admission: EM | Admit: 2016-05-03 | Discharge: 2016-05-03 | Disposition: A | Discharge status: Other Acute Inpatient Hospital | Payer: Self-pay | Attending: Emergency Medicine | Admitting: Emergency Medicine

## 2016-05-03 ENCOUNTER — Encounter (HOSPITAL_COMMUNITY): Payer: Self-pay | Admitting: *Deleted

## 2016-05-03 ENCOUNTER — Observation Stay (HOSPITAL_COMMUNITY)
Admission: AD | Admit: 2016-05-03 | Discharge: 2016-05-04 | Disposition: A | Discharge status: Home / Self Care | Payer: Federal, State, Local not specified - Other | Source: Intra-hospital | Attending: Psychiatry | Admitting: Psychiatry

## 2016-05-03 DIAGNOSIS — F411 Generalized anxiety disorder: Secondary | ICD-10-CM | POA: Insufficient documentation

## 2016-05-03 DIAGNOSIS — R45851 Suicidal ideations: Secondary | ICD-10-CM | POA: Insufficient documentation

## 2016-05-03 DIAGNOSIS — Z5321 Procedure and treatment not carried out due to patient leaving prior to being seen by health care provider: Secondary | ICD-10-CM | POA: Insufficient documentation

## 2016-05-03 DIAGNOSIS — F329 Major depressive disorder, single episode, unspecified: Secondary | ICD-10-CM | POA: Insufficient documentation

## 2016-05-03 DIAGNOSIS — F17210 Nicotine dependence, cigarettes, uncomplicated: Secondary | ICD-10-CM | POA: Insufficient documentation

## 2016-05-03 DIAGNOSIS — F1094 Alcohol use, unspecified with alcohol-induced mood disorder: Secondary | ICD-10-CM | POA: Diagnosis present

## 2016-05-03 DIAGNOSIS — F1010 Alcohol abuse, uncomplicated: Secondary | ICD-10-CM | POA: Insufficient documentation

## 2016-05-03 DIAGNOSIS — Z79899 Other long term (current) drug therapy: Secondary | ICD-10-CM | POA: Insufficient documentation

## 2016-05-03 DIAGNOSIS — F332 Major depressive disorder, recurrent severe without psychotic features: Secondary | ICD-10-CM | POA: Insufficient documentation

## 2016-05-03 DIAGNOSIS — F1024 Alcohol dependence with alcohol-induced mood disorder: Principal | ICD-10-CM | POA: Insufficient documentation

## 2016-05-03 DIAGNOSIS — Z91013 Allergy to seafood: Secondary | ICD-10-CM | POA: Insufficient documentation

## 2016-05-03 DIAGNOSIS — F32A Depression, unspecified: Secondary | ICD-10-CM

## 2016-05-03 LAB — COMPREHENSIVE METABOLIC PANEL
ALT: 58 U/L (ref 17–63)
ANION GAP: 15 (ref 5–15)
AST: 62 U/L — AB (ref 15–41)
Albumin: 4.5 g/dL (ref 3.5–5.0)
Alkaline Phosphatase: 54 U/L (ref 38–126)
BILIRUBIN TOTAL: 1.4 mg/dL — AB (ref 0.3–1.2)
BUN: 13 mg/dL (ref 6–20)
CALCIUM: 9.2 mg/dL (ref 8.9–10.3)
CHLORIDE: 101 mmol/L (ref 101–111)
CO2: 24 mmol/L (ref 22–32)
CREATININE: 0.9 mg/dL (ref 0.61–1.24)
GFR calc Af Amer: >60 mL/min (ref >60)
Glucose, Bld: 75 mg/dL (ref 65–99)
POTASSIUM: 3.9 mmol/L (ref 3.5–5.1)
Sodium: 140 mmol/L (ref 135–145)
Total Protein: 7.9 g/dL (ref 6.5–8.1)

## 2016-05-03 LAB — RAPID URINE DRUG SCREEN, HOSP PERFORMED
Amphetamines: NOT DETECTED
BARBITURATES: NOT DETECTED
BENZODIAZEPINES: NOT DETECTED
COCAINE: POSITIVE — AB
Opiates: NOT DETECTED
Tetrahydrocannabinol: NOT DETECTED

## 2016-05-03 LAB — CBC
HEMATOCRIT: 42.5 % (ref 39.0–52.0)
Hemoglobin: 14.7 g/dL (ref 13.0–17.0)
MCH: 27.8 pg (ref 26.0–34.0)
MCHC: 34.6 g/dL (ref 30.0–36.0)
MCV: 80.3 fL (ref 78.0–100.0)
PLATELETS: 275 10*3/uL (ref 150–400)
RBC: 5.29 MIL/uL (ref 4.22–5.81)
RDW: 14.2 % (ref 11.5–15.5)
WBC: 6.4 10*3/uL (ref 4.0–10.5)

## 2016-05-03 LAB — SALICYLATE LEVEL: Salicylate Lvl: <4 mg/dL (ref 2.8–30.0)

## 2016-05-03 LAB — ETHANOL: ALCOHOL ETHYL (B): 14 mg/dL — AB (ref <5)

## 2016-05-03 LAB — ACETAMINOPHEN LEVEL

## 2016-05-03 MED ORDER — ONDANSETRON HCL 4 MG PO TABS: 4.0000 mg | ORAL_TABLET | Freq: Three times a day (TID) | ORAL | Status: DC | PRN

## 2016-05-03 MED ORDER — ALUM & MAG HYDROXIDE-SIMETH 200-200-20 MG/5ML PO SUSP: 30.0000 mL | ORAL | Status: DC | PRN

## 2016-05-03 MED ORDER — TRAZODONE HCL 50 MG PO TABS
50.0000 mg | ORAL_TABLET | Freq: Every evening | ORAL | Status: DC | PRN
  Administered 2016-05-03: 50 mg via ORAL
  Filled 2016-05-03: qty 1

## 2016-05-03 MED ORDER — LORAZEPAM 1 MG PO TABS
1.0000 mg | ORAL_TABLET | Freq: Once | ORAL | Status: AC
  Administered 2016-05-03: 1 mg via ORAL
  Filled 2016-05-03: qty 1

## 2016-05-03 MED ORDER — ADULT MULTIVITAMIN W/MINERALS CH: 1.0000 | ORAL_TABLET | Freq: Every day | ORAL | Status: DC

## 2016-05-03 MED ORDER — SERTRALINE HCL 50 MG PO TABS
50.0000 mg | ORAL_TABLET | Freq: Every day | ORAL | Status: DC
  Administered 2016-05-04: 50 mg via ORAL
  Filled 2016-05-03: qty 1

## 2016-05-03 MED ORDER — LORAZEPAM 1 MG PO TABS
0.0000 mg | ORAL_TABLET | Freq: Four times a day (QID) | ORAL | Status: DC
  Administered 2016-05-03 – 2016-05-04 (×4): 1 mg via ORAL
  Filled 2016-05-03 (×4): qty 1

## 2016-05-03 MED ORDER — CHLORDIAZEPOXIDE HCL 25 MG PO CAPS
50.0000 mg | ORAL_CAPSULE | Freq: Once | ORAL | Status: AC
  Administered 2016-05-03: 50 mg via ORAL
  Filled 2016-05-03: qty 2

## 2016-05-03 MED ORDER — ADULT MULTIVITAMIN W/MINERALS CH
1.0000 | ORAL_TABLET | Freq: Every day | ORAL | Status: DC
  Administered 2016-05-04: 1 via ORAL
  Filled 2016-05-03: qty 1

## 2016-05-03 MED ORDER — ACETAMINOPHEN 325 MG PO TABS: 650.0000 mg | ORAL_TABLET | ORAL | Status: DC | PRN

## 2016-05-03 MED ORDER — SERTRALINE HCL 50 MG PO TABS: 50.0000 mg | ORAL_TABLET | Freq: Every day | ORAL | Status: DC

## 2016-05-03 MED ORDER — TRAZODONE HCL 50 MG PO TABS: 50.0000 mg | ORAL_TABLET | Freq: Every evening | ORAL | Status: DC | PRN

## 2016-05-03 MED ORDER — ONDANSETRON HCL 4 MG/2ML IJ SOLN
4.0000 mg | Freq: Once | INTRAMUSCULAR | Status: AC
  Administered 2016-05-03: 4 mg via INTRAVENOUS
  Filled 2016-05-03: qty 2

## 2016-05-03 MED ORDER — LORAZEPAM 1 MG PO TABS: 0.0000 mg | ORAL_TABLET | Freq: Four times a day (QID) | ORAL | Status: DC

## 2016-05-03 MED ORDER — MAGNESIUM HYDROXIDE 400 MG/5ML PO SUSP: 30.0000 mL | Freq: Every day | ORAL | Status: DC | PRN

## 2016-05-03 MED ORDER — LORAZEPAM 1 MG PO TABS: 0.0000 mg | ORAL_TABLET | Freq: Two times a day (BID) | ORAL | Status: DC

## 2016-05-03 MED ORDER — SODIUM CHLORIDE 0.9 % IV BOLUS (SEPSIS)
1000.0000 mL | Freq: Once | INTRAVENOUS | Status: AC
  Administered 2016-05-03: 1000 mL via INTRAVENOUS

## 2016-05-03 NOTE — ED Notes (Signed)
[facility] was called and will be in route to take pt [facility]

## 2016-05-03 NOTE — ED Notes (Signed)
Pt states he had his last drink about 8pm last night. States he has been drinking for 2 weeks straight. Pt states he vomited the last time about 7:30am then called EMS.

## 2016-05-03 NOTE — ED Notes (Signed)
Pt given urinal, made aware we need a sample of urine

## 2016-05-03 NOTE — ED Provider Notes (Signed)
CSN: [id number]     Arrival date & time 05/03/16  [id number] History   First MD Initiated Contact with Patient 05/03/16 [id number]     Chief Complaint  Patient presents with  . Alcohol Problem  . Nausea     (Consider location/radiation/quality/duration/timing/severity/associated sxs/prior Treatment) HPI  45 year old male who presents for detox. He has a history of alcohol abuse, depression and anxiety. Was seen here June 15 for intermittent passive suicidal thoughts, hopelessness, and increased depression. He was in the behavioral health Hospital under observation for 1-2 days, initiated on Zoloft, and discharge him and symptoms improved. He states that he started to pick up alcohol drinking since discharge and not been taking his Zoloft. Drinks about a pack of beer per day. He states that his last drink was at 8 PM yesterday as he wanted to detox, and started having nausea, vomiting, and anxiety this morning. EMS was called by patient, and on their arrival he had some vomiting. Per EMS, he was drinking at that time. Still endorses hopelessness, no SI or attempt. No HI, VAH. Denies fall or trauma.  Past Medical History  Diagnosis Date  . Alcohol abuse   . Depression   . Anxiety    Past Surgical History  Procedure Laterality Date  . Tonsillectomy     No family history on file. Social History  Substance Use Topics  . Smoking status: Current Every Day Smoker -- 1.00 packs/day    Types: Cigarettes  . Smokeless tobacco: Never Used  . Alcohol Use: 0.6 oz/week    1 Cans of beer per week     Comment: a case a day; pt told this writer 12 pk daily or a fifth of vodka    Review of Systems 10/14 systems reviewed and are negative other than those stated in the HPI    Allergies  Shellfish allergy  Home Medications   Prior to Admission medications   Medication Sig Start Date End Date Taking? Authorizing Provider  Multiple Vitamin (MULTIVITAMIN WITH MINERALS) TABS tablet Take 1 tablet by mouth  daily. 04/20/16  Yes Thermon LeylandLaura A Davis, NP  sertraline (ZOLOFT) 50 MG tablet Take 1 tablet (50 mg total) by mouth daily. 04/20/16  Yes Thermon LeylandLaura A Davis, NP  traZODone (DESYREL) 50 MG tablet Take 1 tablet (50 mg total) by mouth at bedtime as needed for sleep. 04/20/16  Yes Thermon LeylandLaura A Davis, NP   BP 131/90 mmHg  Pulse 78  Temp(Src) 98.2 F (36.8 C) (Oral)  Resp 16  SpO2 96% Physical Exam Physical Exam  Nursing note and vitals reviewed. Constitutional: Well developed, well nourished, non-toxic, and in no acute distress Head: Normocephalic and atraumatic.  Mouth/Throat: Oropharynx is clear and moist.  Neck: Normal range of motion. Neck supple.  Cardiovascular: Normal rate and regular rhythm.   Pulmonary/Chest: Effort normal and breath sounds normal.  Abdominal: Soft. There is no tenderness. There is no rebound and no guarding.  Musculoskeletal: Normal range of motion.  Neurological: Alert, no facial droop, fluent speech, moves all extremities symmetrically Skin: Skin is warm and dry.  Psychiatric: Cooperative  ED Course  Procedures (including critical care time) Labs Review Labs Reviewed  COMPREHENSIVE METABOLIC PANEL - Abnormal; Notable for the following:    AST 62 (*)    Total Bilirubin 1.4 (*)    All other components within normal limits  ETHANOL - Abnormal; Notable for the following:    Alcohol, Ethyl (B) 14 (*)    All other components within normal limits  URINE  RAPID DRUG SCREEN, HOSP PERFORMED - Abnormal; Notable for the following:    Cocaine POSITIVE (*)    All other components within normal limits  ACETAMINOPHEN LEVEL - Abnormal; Notable for the following:    Acetaminophen (Tylenol), Serum <10 (*)    All other components within normal limits  CBC  SALICYLATE LEVEL    Imaging Review No results found. I have personally reviewed and evaluated these images and lab results as part of my medical decision-making.   EKG Interpretation None      MDM   Final diagnoses:  None     With minimal withdrawal symptoms on arrival. Vital signs are's within normal limits. He does have C was scored 7, given Ativan and Librium, with resolution of symptoms. Nausea and vomiting improved after a tight medics as well. Blood work overall unremarkable. TTS initially consulted given patient's depression and hopelessness, and they have recommended that he be observed in the behavioral health Hospital. Psych hold orders are placed while he is waiting for available bed.   Lavera Guiseana Duo Liu, MD 05/03/16 1409

## 2016-05-03 NOTE — ED Notes (Signed)
David with TTS in with pt

## 2016-05-03 NOTE — BH Assessment (Signed)
[facility] Assessment Progress Note  Per Claudette Headonrad Withrow, DNP, this pt would benefit [facility] at this [facility].  Berneice Heinrichina Tate, RN, [facility] to Obs 1.  Pt has signed Voluntary Admission and Consent for Treatment, as well as Consent to Release Information, and signed [facility].  Pt's nurse has been notified, and agrees to send original paperwork along with pt via [facility], and to call report to [phone] or [phone].  Doylene Canninghomas Murel Shenberger, MA Triage Specialist [phone]

## 2016-05-03 NOTE — BH Assessment (Addendum)
Assessment Note  Chad Sanchez is an 45 y.o. male that presents this date requesting detox for alcohol use. Patient does report some S/I with a plan to "run into traffic" but states he "doesn't want to but feels at times, he wants to give up." Patient reports daily ETOH use stating he consumes 4 to 6 40 oz beers daily with moderate cocaine use. Patient reports using up to 1 gram of powdered cocaine 2 to 3 times a week. Patient reports multiple admissions with the last one on [facility]/16 when [facility]. Patient stated he has failed to follow up with outpatient treatment due to not having transportation and has not been complaint with prescribed medications (Zoloft) on discharge due to not having the funds. Patient stated he received treatment from [facility] for two years in 2009 but soon relapsed after being discharged. Patient reports ongoing depression rating his symptoms at a 9 at the time of admission. Patient reports intermittent passive suicidal thoughts, hopelessness, and increased anxiety. He states that his last drink was on 05/02/16 where patient reported consuming 6 40 oz beers and then started "getting really sick." Patient reports ongoing nausea, vomiting, and anxiety this morning. EMS was called by patient, and on their arrival he had some vomiting. Per EMS note: "Patient endorses hopelessness, no SI or attempt. No HI, AVH.  Denies fall or trauma.  Patient stated he has attempted suicide multiple times in the past with a plan this time to "run into traffic.". Pt did not report any self-injurious behaviors. Pt is reporting multiple depressive symptoms and stated everything is stressing me out". Pt reported that his sleep and appetite have been poor. Pt denies any pending criminal charges or upcoming court dates.Pt did not report any current mental health treatment (outpatient) and shared that he has been inpatient several times in the past. Pt did not report any  physical, sexual or emotional abuse at this time. Case was staffed with Renata Capriceonrad DNP who recommended patient be observed in the Observational Unit.    Diagnosis: Major depressive D/O recurrent severe, Alcohol-induced depressive disorder, severe     Past Medical History:  Past Medical History  Diagnosis Date  . Alcohol abuse   . Depression   . Anxiety     Past Surgical History  Procedure Laterality Date  . Tonsillectomy      Family History: No family history on file.  Social History:  reports that he has been smoking Cigarettes.  He has been smoking about 1.00 pack per day. He has never used smokeless tobacco. He reports that he drinks about 0.6 oz of alcohol per week. He reports that he uses illicit drugs ("Crack" cocaine and Cocaine).  Additional Social History:  Alcohol / Drug Use Pain Medications: See MAR Prescriptions: See MAR Over the Counter: See MAR History of alcohol / drug use?: Yes Longest period of sobriety (when/how long): 2 years Withdrawal Symptoms: Agitation, Tremors, Cramps Substance #1 Name of Substance 1: Alcohol 1 - Age of First Use: 17 1 - Amount (size/oz): 3 or 4 40 oz beers  1 - Frequency: Daily 1 - Duration: Last 5 years 1 - Last Use / Amount: 05/02/16 4 40 oz beers Substance #2 Name of Substance 2: Cocaine 2 - Age of First Use: 17 2 - Amount (size/oz): 1 gram 2 - Frequency: twice a week 2 - Duration: Last 4 years 2 - Last Use / Amount: 05/02/16  CIWA: CIWA-Ar BP: 138/97 mmHg Pulse Rate:  82 Nausea and Vomiting: 2 Tactile Disturbances: none Tremor: two Auditory Disturbances: not present Paroxysmal Sweats: barely perceptible sweating, palms moist Visual Disturbances: not present Anxiety: two Headache, Fullness in Head: none present Agitation: normal activity Orientation and Clouding of Sensorium: oriented and can do serial additions CIWA-Ar Total: 7 COWS:    Allergies:  Allergies  Allergen Reactions  . Shellfish Allergy Anaphylaxis     All seafood    Home Medications:  (Not in a hospital admission)  OB/GYN Status:  No LMP for male patient.  General Assessment Data Location of Assessment: [facility] ED TTS Assessment: In system Is this a Tele or Face-to-Face Assessment?: Face-to-Face Is this an Initial Assessment or a Re-assessment for this encounter?: Initial Assessment Marital status: Single Maiden name: na Is patient pregnant?: No Pregnancy Status: No Living Arrangements: Non-relatives/Friends Can pt return to current living arrangement?: Yes Admission Status: Voluntary Is patient capable of signing voluntary admission?: Yes Referral Source: Self/Family/Friend Insurance type: No  Medical Screening Exam [facility] Walk-in ONLY) Medical Exam completed: Yes  Crisis Care Plan Living Arrangements: Non-relatives/Friends Legal Guardian: Other: (na) Name of Psychiatrist: None  Name of Therapist: None   Education Status Is patient currently in school?: No Current Grade: na Highest grade of school patient has completed: Some college Name of school: N/A Contact person: N/A  Risk to self with the past 6 months Suicidal Ideation: Yes-Currently Present Has patient been a risk to self within the past 6 months prior to admission? : Yes Suicidal Intent: No Has patient had any suicidal intent within the past 6 months prior to admission? : Yes Is patient at risk for suicide?: Yes Suicidal Plan?: No Has patient had any suicidal plan within the past 6 months prior to admission? : Yes Specify Current Suicidal Plan: Step in front of car Access to Means: Yes Specify Access to Suicidal Means: [place] What has been your use of drugs/alcohol within the last 12 months?: Current use Previous Attempts/Gestures: Yes How many times?: 3 Other Self Harm Risks: na Triggers for Past Attempts: Unpredictable, Other (Comment) Intentional Self Injurious Behavior: None Family Suicide History: Unknown Recent stressful life  event(s): Other (Comment) (increased drinking) Persecutory voices/beliefs?: No Depression: Yes Depression Symptoms: Feeling worthless/self pity, Feeling angry/irritable Substance abuse history and/or treatment for substance abuse?: Yes Suicide prevention information given to non-admitted patients: Yes  Risk to Others within the past 6 months Homicidal Ideation: No Does patient have any lifetime risk of violence toward others beyond the six months prior to admission? : No Thoughts of Harm to Others: No Current Homicidal Intent: No Current Homicidal Plan: No Access to Homicidal Means: No Identified Victim: na History of harm to others?: No Assessment of Violence: None Noted Violent Behavior Description: na Does patient have access to weapons?: No Criminal Charges Pending?: No Does patient have a court date: No Is patient on probation?: No  Psychosis Hallucinations: None noted Delusions: None noted  Mental Status Report Appearance/Hygiene: Unremarkable Eye Contact: Fair Motor Activity: Freedom of movement Speech: Logical/coherent Level of Consciousness: Quiet/awake Mood: Depressed Affect: Appropriate to circumstance Anxiety Level: Minimal Thought Processes: Coherent, Relevant Judgement: Unimpaired Orientation: Person, Place, Time, Appropriate for developmental age, Situation Obsessive Compulsive Thoughts/Behaviors: None  Cognitive Functioning Concentration: Fair Memory: Recent Intact, Remote Intact IQ: Average Insight: Fair Impulse Control: Poor Appetite: Poor Weight Loss: 10 Weight Gain: 0 Sleep: Decreased Total Hours of Sleep: 4 Vegetative Symptoms: None  ADLScreening [facility] Assessment Services) Patient's cognitive ability adequate to safely complete daily activities?: Yes  Patient able to express need for assistance with ADLs?: Yes Independently performs ADLs?: Yes (appropriate for developmental age)  Prior Inpatient Therapy Prior Inpatient Therapy: Yes Prior  Therapy Dates: 2011, 2014, 2015 Prior Therapy Facilty/Provider(s): [facility] Reason for Treatment: Substance abuse, depression   Prior Outpatient Therapy Prior Outpatient Therapy: No Prior Therapy Dates: na Prior Therapy Facilty/Provider(s): [facility] Reason for Treatment: Alcohol use, SI Does patient have an ACCT team?: No Does patient have Intensive In-House Services?  : No Does patient have [facility] services? : Yes (2015 detox) Does patient have [facility] services?: No  ADL Screening (condition at time of admission) Patient's cognitive ability adequate to safely complete daily activities?: Yes Is the patient deaf or have difficulty hearing?: No Does the patient have difficulty seeing, even when wearing glasses/contacts?: No Does the patient have difficulty concentrating, remembering, or making decisions?: No Patient able to express need for assistance with ADLs?: Yes Does the patient have difficulty dressing or bathing?: No Independently performs ADLs?: Yes (appropriate for developmental age) Does the patient have difficulty walking or climbing stairs?: No Weakness of Legs: None Weakness of Arms/Hands: None  Home Assistive Devices/Equipment Home Assistive Devices/Equipment: None  Therapy Consults (therapy consults require a physician order) PT Evaluation Needed: No OT Evalulation Needed: No SLP Evaluation Needed: No Abuse/Neglect Assessment (Assessment to be complete while patient is alone) Physical Abuse: Denies Verbal Abuse: Denies Sexual Abuse: Denies Exploitation of patient/patient's resources: Denies Self-Neglect: Denies Values / Beliefs Cultural Requests During Hospitalization: None Spiritual Requests During Hospitalization: None Consults Spiritual Care Consult Needed: No Social Work Consult Needed: No Merchant navy officer (For Healthcare) Does patient have an advance directive?: No Would patient like information on creating an advanced directive?: No - patient  declined information (pt declines information)    Additional Information 1:1 In Past 12 Months?: No CIRT Risk: No Elopement Risk: No Does patient have medical clearance?: No     Disposition: Case was staffed with Renata Caprice DNP who recommended patient be observed in the Observational Unit.  Disposition Initial Assessment Completed for this Encounter: Yes Disposition of Patient: Other dispositions Other disposition(s): Other (Comment) (Observation Unit)  On Site Evaluation by:   Reviewed with Physician:    Alfredia Ferguson 05/03/2016 12:06 PM

## 2016-05-03 NOTE — ED Notes (Signed)
[facility] via [place].

## 2016-05-03 NOTE — ED Notes (Signed)
Continues to rest without complaints.

## 2016-05-03 NOTE — Progress Notes (Signed)
Admission Note: Pt is a 45 y.o.AAM [facility] help with ETOH detox. Pt presents with flat affect and anxious mood. Denies SI, HI, AVH and pain when assessed. Cooperative with assessment. Per chart pt does have a h/o ETOH abuse, anxiety and depression. Patient reports daily ETOH use stating he consumes 4 to 6 40 oz beers daily with moderate cocaine use. Per pt last drink was on 05/02/16 and states he uses up to 1 gram of powdered cocaine 2 to 3 times a week. Skin assessment done, scaly, dried patches noted on pt's back. All belongings searched and secured in locker. Unit orientation done and routines discussed with pt; understanding verbalized. Fluids and lunch offered and tolerated well. Pt showered and changed his scrubs. No self harm gestures or outburst noted at this time. Will continue to monitor pt for safety and symptoms stabilization.

## 2016-05-03 NOTE — ED Notes (Signed)
Pt resting without distress, has not vomited since arrival, meds given along with blanket and pillow.

## 2016-05-03 NOTE — ED Notes (Signed)
Bed: [id number] Expected date: 05/03/16 Expected time: 8:52 AM Means of arrival: Ambulance Comments: N/V ETOH

## 2016-05-03 NOTE — ED Notes (Signed)
EMS called downtown by pt, pt drinking then started to vomit. Ambulatory at scene.

## 2016-05-03 NOTE — BH Assessment (Signed)
[facility] Assessment Progress Note  Case was staffed with Renata Capriceonrad DNP who recommended patient be observed in the Observational Unit. Patient was accepted to OBS Bed 1.

## 2016-05-04 DIAGNOSIS — F332 Major depressive disorder, recurrent severe without psychotic features: Secondary | ICD-10-CM | POA: Diagnosis not present

## 2016-05-04 NOTE — Progress Notes (Signed)
D: Patient sleeping at the time of shift change. Staff woke patient up for assessment and med. Patient denies pain, SI, AH/VH at this time. Endorses de[pression of 6/10. Made no further complaint.  A: Staff offered support and encouragement as needed. Due meds given as ordered. Safety maintained by constant observation.  R: Patient remains safe.

## 2016-05-04 NOTE — Progress Notes (Signed)
Patient to discharged AMA to lobby at his request and NP approval. Patient reports feeling much better after hydration and rest.  All discharge paperwork given and signed, belongings returned. Out patient resource list  given. Patient able to verbalize understanding. Patient stable, denies SI/HI/AVH. Patient given opportunity to express concerns and ask questions. VSS 98.3-124/80-72-16 100% on RA. Ambulatory without distress.

## 2016-05-04 NOTE — Discharge Summary (Signed)
                 [facility]-Observation Unit Discharge Summary  Patient reports that he has unable to stop drinking on his own. He was recently discharged from the Observation Unit on 04/20/2016. Patient denies any suicidal ideation. On admission his urine drug screen was positive for cocaine and alcohol level was 14. He reports not taking zoloft due to sexual side effects stating "[facility] and it gave me problems. I drink no to be sick. I feel depressed." Patient reports last time he left the Observation Unit was on 04/20/2016 and was going to follow up at [facility]. The patient reports that upon arriving there was not a bed available although staff had verified earlier. Chad Sanchez appeared [facility] treatment to address his chronic alcohol abuse. His vital signs appear stable and his last CIWA score was five. Nursing staff later informed writer that the patient was requesting discharge and had verbalized intent to leave AMA. Patient reports feeling much better after hydration and rest. All discharge paperwork given and signed, belongings returned. Out patient resource listgiven. Patient able to verbalize understanding. Patient stable, denies SI/HI/AVH.

## 2016-05-04 NOTE — H&P (Signed)
[facility] Provider Admission PAA/H&P  Patient Identification: Bruin Oliverio Cho MRN:  [id number] Date of Evaluation:  05/04/2016 Chief Complaint:  Patient states "I have to drink so I don't feel sick."  Principal Diagnosis: Alcohol-induced mood disorder ([place]) Diagnosis:   Patient Active Problem List   Diagnosis Date Noted  . MDD (major depressive disorder), recurrent severe, without psychosis ([place]) [F33.2] 05/03/2016  . Alcohol-induced mood disorder ([place]) [F10.94] 12/16/2015  . Alcohol dependence with uncomplicated withdrawal ([place]) [F10.230] 12/16/2015  . GAD (generalized anxiety disorder) [F41.1] 01/14/2014   History of Present Illness:   Per initial assessment at [facility] on 05/03/2016:  Nero Carder Yin is an 45 y.o. male that presents this date requesting detox for alcohol use. Patient does report some S/I with a plan to "run into traffic" but states he "doesn't want to but feels at times, he wants to give up." Patient reports daily ETOH use stating he consumes 4 to 6 40 oz beers daily with moderate cocaine use. Patient reports using up to 1 gram of powdered cocaine 2 to 3 times a week. Patient reports multiple admissions with the last one on 04/18/16 when patient was admitted to [facility] for similar symptoms. Patient stated he has failed to follow up with outpatient treatment due to not having transportation and has not been complaint with prescribed medications (Zoloft) on discharge due to not having the funds. Patient stated he received treatment from [facility] for two years in 2009 but soon relapsed after being discharged. Patient reports ongoing depression rating his symptoms at a 9 at the time of admission. Patient reports intermittent passive suicidal thoughts, hopelessness, and increased anxiety. He states that his last drink was on 05/02/16 where patient reported consuming 6 40 oz beers and then started "getting really sick." Patient reports ongoing nausea, vomiting, and anxiety this  morning. EMS was called by patient, and on their arrival he had some vomiting.   Assessment on 05/04/2016: Patient reports that he has unable to stop drinking on his own. He was recently discharged from the Observation Unit. Patient denies any suicidal ideation. On admission his urine drug screen was positive for cocaine and alcohol level was 14. He reports not taking zoloft due to sexual side effects stating "I took it for two years at [facility] and it gave me problems. I drink no to be sick. I feel depressed." Patient reports last time he left the Observation Unit was on 04/20/2016 and was going to follow up at [facility]. The patient reports that upon arriving there was not a bed available although staff had verified earlier. Norvil appeared interested in being referred to [facility] for residential treatment to address his chronic alcohol abuse. His vital signs appear stable and his last CIWA score was five.   Associated Signs/Symptoms: Depression Symptoms:  depressed mood, anhedonia, psychomotor retardation, fatigue, feelings of worthlessness/guilt, difficulty concentrating, hopelessness, recurrent thoughts of death, suicidal thoughts with specific plan, anxiety, loss of energy/fatigue, disturbed sleep, (Hypo) Manic Symptoms:  Denies Anxiety Symptoms:  Excessive Worry, Psychotic Symptoms:  Denies PTSD Symptoms: Negative Total Time spent with patient: 45 minutes  Past Psychiatric History: Alcohol induced mood disorder   Is the patient at risk to self? Yes.    Has the patient been a risk to self in the past 6 months? Yes.    Has the patient been a risk to self within the distant past? Yes.    Is the patient a risk to others? No.  Has the patient been a risk  to others in the past 6 months? No.  Has the patient been a risk to others within the distant past? No.   Prior Inpatient Therapy:   Yes Prior Outpatient Therapy:  Yes  Alcohol Screening:   Substance Abuse History in the last 12  months:  No. Consequences of Substance Abuse: Withdrawal Symptoms:   Diaphoresis Tremors Previous Psychotropic Medications: Yes Zoloft, Abilify, Seroquel, Trazodone  Psychological Evaluations: No  Past Medical History:  Past Medical History  Diagnosis Date  . Alcohol abuse   . Depression   . Anxiety     Past Surgical History  Procedure Laterality Date  . Tonsillectomy     Family History: History reviewed. No pertinent family history. Family Psychiatric History: Patient stated "My mother died of alcohol abuse." Tobacco Screening: One half pack per day.  Social History:  History  Alcohol Use  . 0.6 oz/week  . 1 Cans of beer per week    Comment: a case a day; pt told this writer 12 pk daily or a fifth of vodka     History  Drug Use  . Yes  . Special: "Crack" cocaine, Cocaine    Comment: Pt endorses using crack cocaine    Additional Social History:                           Allergies:   Allergies  Allergen Reactions  . Shellfish Allergy Anaphylaxis    All seafood   Lab Results:  Results for orders placed or performed during the hospital encounter of 05/03/16 (from the past 48 hour(s))  Comprehensive metabolic panel     Status: Abnormal   Collection Time: 05/03/16  9:21 AM  Result Value Ref Range   Sodium 140 135 - 145 mmol/L   Potassium 3.9 3.5 - 5.1 mmol/L   Chloride 101 101 - 111 mmol/L   CO2 24 22 - 32 mmol/L   Glucose, Bld 75 65 - 99 mg/dL   BUN 13 6 - 20 mg/dL   Creatinine, Ser 0.90 0.61 - 1.24 mg/dL   Calcium 9.2 8.9 - 10.3 mg/dL   Total Protein 7.9 6.5 - 8.1 g/dL   Albumin 4.5 3.5 - 5.0 g/dL   AST 62 (H) 15 - 41 U/L   ALT 58 17 - 63 U/L   Alkaline Phosphatase 54 38 - 126 U/L   Total Bilirubin 1.4 (H) 0.3 - 1.2 mg/dL   GFR calc non Af Amer >60 >60 mL/min   GFR calc Af Amer >60 >60 mL/min    Comment: (NOTE) The eGFR has been calculated using the CKD EPI equation. This calculation has not been validated in all clinical situations. eGFR's  persistently <60 mL/min signify possible Chronic Kidney Disease.    Anion gap 15 5 - 15  Ethanol     Status: Abnormal   Collection Time: 05/03/16  9:21 AM  Result Value Ref Range   Alcohol, Ethyl (B) 14 (H) <5 mg/dL    Comment:        LOWEST DETECTABLE LIMIT FOR SERUM ALCOHOL IS 5 mg/dL FOR MEDICAL PURPOSES ONLY   cbc     Status: None   Collection Time: 05/03/16  9:21 AM  Result Value Ref Range   WBC 6.4 4.0 - 10.5 K/uL   RBC 5.29 4.22 - 5.81 MIL/uL   Hemoglobin 14.7 13.0 - 17.0 g/dL   HCT 42.5 39.0 - 52.0 %   MCV 80.3 78.0 - 100.0 fL  MCH 27.8 26.0 - 34.0 pg   MCHC 34.6 30.0 - 36.0 g/dL   RDW 14.2 11.5 - 15.5 %   Platelets 275 150 - 400 K/uL  Acetaminophen level     Status: Abnormal   Collection Time: 05/03/16  9:21 AM  Result Value Ref Range   Acetaminophen (Tylenol), Serum <10 (L) 10 - 30 ug/mL    Comment:        THERAPEUTIC CONCENTRATIONS VARY SIGNIFICANTLY. A RANGE OF 10-30 ug/mL MAY BE AN EFFECTIVE CONCENTRATION FOR MANY PATIENTS. HOWEVER, SOME ARE BEST TREATED AT CONCENTRATIONS OUTSIDE THIS RANGE. ACETAMINOPHEN CONCENTRATIONS >150 ug/mL AT 4 HOURS AFTER INGESTION AND >50 ug/mL AT 12 HOURS AFTER INGESTION ARE OFTEN ASSOCIATED WITH TOXIC REACTIONS.   Salicylate level     Status: None   Collection Time: 05/03/16  9:21 AM  Result Value Ref Range   Salicylate Lvl <2.8 2.8 - 30.0 mg/dL  Rapid urine drug screen (hospital performed)     Status: Abnormal   Collection Time: 05/03/16 10:23 AM  Result Value Ref Range   Opiates NONE DETECTED NONE DETECTED   Cocaine POSITIVE (A) NONE DETECTED   Benzodiazepines NONE DETECTED NONE DETECTED   Amphetamines NONE DETECTED NONE DETECTED   Tetrahydrocannabinol NONE DETECTED NONE DETECTED   Barbiturates NONE DETECTED NONE DETECTED    Comment:        DRUG SCREEN FOR MEDICAL PURPOSES ONLY.  IF CONFIRMATION IS NEEDED FOR ANY PURPOSE, NOTIFY LAB WITHIN 5 DAYS.        LOWEST DETECTABLE LIMITS FOR URINE DRUG SCREEN Drug  Class       Cutoff (ng/mL) Amphetamine      1000 Barbiturate      200 Benzodiazepine   768 Tricyclics       115 Opiates          300 Cocaine          300 THC              50     Blood Alcohol level:  Lab Results  Component Value Date   ETH 14* 05/03/2016   ETH 209* 72/62/0355    Metabolic Disorder Labs:  No results found for: HGBA1C, MPG No results found for: PROLACTIN No results found for: CHOL, TRIG, HDL, CHOLHDL, VLDL, LDLCALC  Current Medications: Current Facility-Administered Medications  Medication Dose Route Frequency Provider Last Rate Last Dose  . acetaminophen (TYLENOL) tablet 650 mg  650 mg Oral Q4H PRN Benjamine Mola, FNP      . alum & mag hydroxide-simeth (MAALOX/MYLANTA) 200-200-20 MG/5ML suspension 30 mL  30 mL Oral Q4H PRN Benjamine Mola, FNP      . LORazepam (ATIVAN) tablet 0-4 mg  0-4 mg Oral Q6H Benjamine Mola, FNP   1 mg at 05/04/16 1211   Followed by  . [START ON 05/05/2016] LORazepam (ATIVAN) tablet 0-4 mg  0-4 mg Oral Q12H John C Withrow, FNP      . magnesium hydroxide (MILK OF MAGNESIA) suspension 30 mL  30 mL Oral Daily PRN Benjamine Mola, FNP      . multivitamin with minerals tablet 1 tablet  1 tablet Oral Daily Benjamine Mola, FNP   1 tablet at 05/04/16 [id number]  . ondansetron (ZOFRAN) tablet 4 mg  4 mg Oral Q8H PRN Benjamine Mola, FNP      . traZODone (DESYREL) tablet 50 mg  50 mg Oral QHS PRN Benjamine Mola, FNP   50 mg at 05/03/16 2154  PTA Medications: Prescriptions prior to admission  Medication Sig Dispense Refill Last Dose  . Multiple Vitamin (MULTIVITAMIN WITH MINERALS) TABS tablet Take 1 tablet by mouth daily.   Past Month at Unknown time  . sertraline (ZOLOFT) 50 MG tablet Take 1 tablet (50 mg total) by mouth daily. 15 tablet 0 Past Month at Unknown time  . traZODone (DESYREL) 50 MG tablet Take 1 tablet (50 mg total) by mouth at bedtime as needed for sleep. 15 tablet 0 Past Month at Unknown time    Musculoskeletal: Strength & Muscle Tone:  within normal limits Gait & Station: normal Patient leans: N/A  Psychiatric Specialty Exam: Physical Exam  Review of Systems  Neurological: Positive for tremors.  Psychiatric/Behavioral: Positive for depression, suicidal ideas and substance abuse. Negative for hallucinations and memory loss. The patient is nervous/anxious and has insomnia.     Blood pressure 124/80, pulse 72, temperature 98.3 F (36.8 C), temperature source Oral, resp. rate 16, height 6' 1.5" (1.867 m), weight 71.215 kg (157 lb), SpO2 100 %.Body mass index is 20.43 kg/(m^2).  General Appearance: Casual  Eye Contact:  Good  Speech:  Clear and Coherent  Volume:  Decreased  Mood:  Dysphoric  Affect:  Flat  Thought Process:  Coherent and Goal Directed  Orientation:  Full (Time, Place, and Person)  Thought Content:  Symptoms, worries, concerns  Suicidal Thoughts:  Yes.  without intent/plan  Homicidal Thoughts:  No  Memory:  Immediate;   Good Recent;   Good Remote;   Good  Judgement:  Impaired  Insight:  Shallow  Psychomotor Activity:  Decreased  Concentration:  Concentration: Good and Attention Span: Good  Recall:  Good  Fund of Knowledge:  Good  Language:  Good  Akathisia:  No  Handed:  Right  AIMS (if indicated):     Assets:  Communication Skills Desire for Improvement Leisure Time Physical Health Resilience  ADL's:  Intact  Cognition:  WNL  Sleep:         Treatment Plan Summary: Daily contact with patient to assess and evaluate symptoms and progress in treatment and Medication management  Observation Level/Precautions:  Continuous Observation Laboratory:  CBC Chemistry Profile UDS ETOH Psychotherapy:  Individual for substance abuse counseling  Medications:  Discontinue as patient has not been taking due to sexual side effects, Would consider Wellbutrin when patient is not at risk for seizures related to alcohol abuse,  Ativan taper for alcohol detox  Consultations:  None Discharge Concerns:   Continued substance abuse, medication non-compliance  Estimated LOS: 24-48 hours Other:  Referral for homeless shelter, substance abuse treatment   Darneisha Windhorst, Mickel Baas, NP 7/1/201712:56 PM

## 2016-05-12 ENCOUNTER — Emergency Department (HOSPITAL_COMMUNITY)
Admission: EM | Admit: 2016-05-12 | Discharge: 2016-05-12 | Disposition: A | Discharge status: Home / Self Care | Payer: Self-pay | Attending: Emergency Medicine | Admitting: Emergency Medicine

## 2016-05-12 ENCOUNTER — Encounter (HOSPITAL_COMMUNITY): Payer: Self-pay | Admitting: Emergency Medicine

## 2016-05-12 DIAGNOSIS — F329 Major depressive disorder, single episode, unspecified: Secondary | ICD-10-CM | POA: Insufficient documentation

## 2016-05-12 DIAGNOSIS — F10230 Alcohol dependence with withdrawal, uncomplicated: Secondary | ICD-10-CM

## 2016-05-12 DIAGNOSIS — F17210 Nicotine dependence, cigarettes, uncomplicated: Secondary | ICD-10-CM | POA: Insufficient documentation

## 2016-05-12 DIAGNOSIS — F1014 Alcohol abuse with alcohol-induced mood disorder: Secondary | ICD-10-CM | POA: Insufficient documentation

## 2016-05-12 DIAGNOSIS — Z79899 Other long term (current) drug therapy: Secondary | ICD-10-CM | POA: Insufficient documentation

## 2016-05-12 DIAGNOSIS — F1094 Alcohol use, unspecified with alcohol-induced mood disorder: Secondary | ICD-10-CM

## 2016-05-12 DIAGNOSIS — F1490 Cocaine use, unspecified, uncomplicated: Secondary | ICD-10-CM | POA: Insufficient documentation

## 2016-05-12 LAB — CBC WITH DIFFERENTIAL/PLATELET
Basophils Absolute: 0 10*3/uL (ref 0.0–0.1)
Basophils Relative: 0 %
Eosinophils Absolute: 0 10*3/uL (ref 0.0–0.7)
Eosinophils Relative: 0 %
HEMATOCRIT: 44.8 % (ref 39.0–52.0)
HEMOGLOBIN: 15.4 g/dL (ref 13.0–17.0)
LYMPHS PCT: 49 %
Lymphs Abs: 2.6 10*3/uL (ref 0.7–4.0)
MCH: 28.1 pg (ref 26.0–34.0)
MCHC: 34.4 g/dL (ref 30.0–36.0)
MCV: 81.8 fL (ref 78.0–100.0)
MONO ABS: 0.4 10*3/uL (ref 0.1–1.0)
MONOS PCT: 8 %
NEUTROS ABS: 2.3 10*3/uL (ref 1.7–7.7)
NEUTROS PCT: 43 %
PLATELETS: 237 10*3/uL (ref 150–400)
RBC: 5.48 MIL/uL (ref 4.22–5.81)
RDW: 14.3 % (ref 11.5–15.5)
WBC: 5.3 10*3/uL (ref 4.0–10.5)

## 2016-05-12 LAB — COMPREHENSIVE METABOLIC PANEL
ALK PHOS: 54 U/L (ref 38–126)
ALT: 41 U/L (ref 17–63)
AST: 53 U/L — AB (ref 15–41)
Albumin: 4.8 g/dL (ref 3.5–5.0)
Anion gap: 14 (ref 5–15)
BILIRUBIN TOTAL: 0.6 mg/dL (ref 0.3–1.2)
BUN: 9 mg/dL (ref 6–20)
CALCIUM: 8.9 mg/dL (ref 8.9–10.3)
CO2: 21 mmol/L — ABNORMAL LOW (ref 22–32)
CREATININE: 0.99 mg/dL (ref 0.61–1.24)
Chloride: 103 mmol/L (ref 101–111)
Glucose, Bld: 85 mg/dL (ref 65–99)
Potassium: 3.8 mmol/L (ref 3.5–5.1)
Sodium: 138 mmol/L (ref 135–145)
TOTAL PROTEIN: 8.7 g/dL — AB (ref 6.5–8.1)

## 2016-05-12 LAB — SALICYLATE LEVEL

## 2016-05-12 LAB — ACETAMINOPHEN LEVEL

## 2016-05-12 LAB — RAPID URINE DRUG SCREEN, HOSP PERFORMED
Amphetamines: POSITIVE — AB
BARBITURATES: NOT DETECTED
Benzodiazepines: POSITIVE — AB
Cocaine: NOT DETECTED
Opiates: NOT DETECTED
Tetrahydrocannabinol: NOT DETECTED

## 2016-05-12 LAB — ETHANOL: ALCOHOL ETHYL (B): 362 mg/dL — AB (ref <5)

## 2016-05-12 MED ORDER — LORAZEPAM 1 MG PO TABS: 0.0000 mg | ORAL_TABLET | Freq: Two times a day (BID) | ORAL | Status: DC

## 2016-05-12 MED ORDER — TRAZODONE HCL 100 MG PO TABS: 100.0000 mg | ORAL_TABLET | Freq: Every day | ORAL | Status: DC

## 2016-05-12 MED ORDER — LORAZEPAM 1 MG PO TABS: 0.0000 mg | ORAL_TABLET | Freq: Four times a day (QID) | ORAL | Status: DC

## 2016-05-12 MED ORDER — SERTRALINE HCL 50 MG PO TABS: 100.0000 mg | ORAL_TABLET | Freq: Every day | ORAL | Status: DC

## 2016-05-12 MED ORDER — SERTRALINE HCL 100 MG PO TABS: 100.0000 mg | ORAL_TABLET | Freq: Every day | ORAL | Status: DC

## 2016-05-12 NOTE — BH Assessment (Addendum)
Tele Assessment Note   Chad Sanchez is an 45 y.o. single male who presents unaccompanied to [facility] ED after being transported voluntarily by Patent examiner. Pt called 911 from a bridge and said he was suicidal and considering jumping. Pt continues to voice suicidal ideation. Pt describes being overwhelmed by daily stressors and says "I don't know how to handle situations." He states he has attempted suicide in the past by trying to have law enforcement shoot him. Pt says he receives medication management through [facility] and is compliant with Zoloft and Trazodone. Pt reports symptoms including crying spells, social withdrawal, loss of interest in usual pleasures, fatigue, irritability, decreased concentration, decreased sleep and feelings of worthlessness and hopelessness. He reports having severe anxiety. He denies current homicidal ideation or history of violence. He denies auditory or visual hallucinations.  Pt has a long history of alcohol abuse. He reports drinking approximately twelve cans of beer daily. He states he only drank three cans in the past twenty-four hours to avoid withdrawal symptoms. Pt reports inhaling approximately one gram of powder cocaine approximately once per week. He says his last use of cocaine was approximately one week ago. Pt reports a history of blackouts and experiencing tremors. He denies any history of seizure. Pt's blood alcohol is less than five and urine drug screen is pending.  Pt cannot identify any specific stressor and insists that "everything in life" is stressful for him. 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 He lives with his cousin. Pt says he was adopted as a child and denies any history of abuse or trauma. He says he has a sister who is supportive. Pt was discharged from observation unit at [facility] on 05/04/16. He indicates his last inpatient psychiatric admission was at [facility] in March 2015. Pt stated he received treatment from  [facility] for two years in 2009 but soon relapsed after being discharged.  Pt is dressed in hospital scrubs, alert, oriented x4 with normal speech and normal motor behavior. Eye contact is good and Pt was tearful at times. Pt's mood is depressed and affect is congruent with mood. Thought process is coherent and relevant. There is no indication Pt is currently responding to internal stimuli or experiencing delusional thought content. Pt was cooperative throughout assessment. He is requesting inpatient dual-diagnosis treatment to address his mood disturbance and alcohol use.   Diagnosis: Alcohol-induced Mood Disorder; Alcohol Use Disorder, Severe; Cocaine Use Disorder, Moderate; Generalized Anxiety Disorder  Past Medical History:  Past Medical History  Diagnosis Date  . Alcohol abuse   . Depression   . Anxiety     Past Surgical History  Procedure Laterality Date  . Tonsillectomy      Family History: No family history on file.  Social History:  reports that he has been smoking Cigarettes.  He has been smoking about 1.00 pack per day. He has never used smokeless tobacco. He reports that he drinks about 0.6 oz of alcohol per week. He reports that he uses illicit drugs ("Crack" cocaine and Cocaine).  Additional Social History:  Alcohol / Drug Use Pain Medications: See MAR Prescriptions: See MAR Over the Counter: See MAR History of alcohol / drug use?: Yes Longest period of sobriety (when/how long): Three years Negative Consequences of Use: Personal relationships, Surveyor, quantity, Work / School Withdrawal Symptoms: Tremors, Blackouts Substance #1 Name of Substance 1: Alcohol 1 - Age of First Use: 17 1 - Amount (size/oz): Approximately 12 beers 1 - Frequency: Daily 1 -  Duration: Last 5 years 1 - Last Use / Amount: 05/11/16, three cans of beer Substance #2 Name of Substance 2: Cocaine 2 - Age of First Use: 17 2 - Amount (size/oz): 1 gram 2 - Frequency: twice a week 2 - Duration: Last 4  years 2 - Last Use / Amount: 05/02/16  CIWA: CIWA-Ar BP: 136/100 mmHg Pulse Rate: 107 COWS:    PATIENT STRENGTHS: (choose at least two) Ability for insight Average or above average intelligence Capable of independent living Communication skills General fund of knowledge Motivation for treatment/growth Physical Health  Allergies:  Allergies  Allergen Reactions  . Shellfish Allergy Anaphylaxis    All seafood    Home Medications:  (Not in a hospital admission)  OB/GYN Status:  No LMP for male patient.  General Assessment Data Location of Assessment: [facility] ED TTS Assessment: In system Is this a Tele or Face-to-Face Assessment?: Face-to-Face Is this an Initial Assessment or a Re-assessment for this encounter?: Initial Assessment Marital status: Single Maiden name: NA Is patient pregnant?: No Pregnancy Status: No Living Arrangements: Other relatives (Staying with cousin) Can pt return to current living arrangement?: Yes Admission Status: Voluntary Is patient capable of signing voluntary admission?: Yes Referral Source: Self/Family/Friend Insurance type: Self-pay     Crisis Care Plan Living Arrangements: Other relatives (Staying with cousin) Legal Guardian: Other: (None) [facility] Name of Therapist: None  Education Status Is patient currently in school?: No Current Grade: NA Highest grade of school patient has completed: Some college Name of school: N/A Contact person: N/A  Risk to self with the past 6 months Suicidal Ideation: Yes-Currently Present Has patient been a risk to self within the past 6 months prior to admission? : Yes Suicidal Intent: Yes-Currently Present Has patient had any suicidal intent within the past 6 months prior to admission? : Yes Is patient at risk for suicide?: Yes Suicidal Plan?: Yes-Currently Present Has patient had any suicidal plan within the past 6 months prior to admission? : Yes Specify Current Suicidal  Plan: Jump from a bridge Access to Means: Yes Specify Access to Suicidal Means: Law enforcement picked up Pt on bridge What has been your use of drugs/alcohol within the last 12 months?: Pt is using alcohol and cocaine Previous Attempts/Gestures: Yes How many times?: 5 (Multiple) Other Self Harm Risks: None Triggers for Past Attempts: Unpredictable, Other (Comment) Intentional Self Injurious Behavior: None Family Suicide History: Unknown Recent stressful life event(s):  ("Everything in life") Persecutory voices/beliefs?: No Depression: Yes Depression Symptoms: Despondent, Insomnia, Tearfulness, Isolating, Fatigue, Guilt, Loss of interest in usual pleasures, Feeling worthless/self pity, Feeling angry/irritable Substance abuse history and/or treatment for substance abuse?: Yes Suicide prevention information given to non-admitted patients: Not applicable  Risk to Others within the past 6 months Homicidal Ideation: No Does patient have any lifetime risk of violence toward others beyond the six months prior to admission? : No Thoughts of Harm to Others: No Current Homicidal Intent: No Current Homicidal Plan: No Access to Homicidal Means: No Identified Victim: None History of harm to others?: No Assessment of Violence: None Noted Violent Behavior Description: Pt denies history of violence Does patient have access to weapons?: No Criminal Charges Pending?: No Does patient have a court date: No Is patient on probation?: No  Psychosis Hallucinations: None noted Delusions: None noted  Mental Status Report Appearance/Hygiene: In scrubs Eye Contact: Good Motor Activity: Unremarkable Speech: Logical/coherent Level of Consciousness: Alert Mood: Depressed Affect: Depressed Anxiety Level: Minimal Thought Processes: Coherent, Relevant Judgement:  Partial Orientation: Person, Place, Time, Appropriate for developmental age, Situation Obsessive Compulsive Thoughts/Behaviors:  None  Cognitive Functioning Concentration: Normal Memory: Recent Intact, Remote Intact IQ: Average Insight: Fair Impulse Control: Fair Appetite: Good Weight Loss: 0 Weight Gain: 0 Sleep: Decreased Total Hours of Sleep: 3 Vegetative Symptoms: None  ADLScreening [facility] Assessment Services) Patient's cognitive ability adequate to safely complete daily activities?: Yes Patient able to express need for assistance with ADLs?: Yes Independently performs ADLs?: Yes (appropriate for developmental age)  Prior Inpatient Therapy Prior Inpatient Therapy: Yes Prior Therapy Dates: 2015, 2014, 2011 Prior Therapy Facilty/Provider(s): [facility] Reason for Treatment: Substance abuse, depression   Prior Outpatient Therapy Prior Outpatient Therapy: Yes Prior Therapy Dates: Current Prior Therapy Facilty/Provider(s): [facility] Reason for Treatment: Depression Does patient have an ACCT team?: No Does patient have Intensive In-House Services?  : No Does patient have [facility] services? : Yes Does patient have P4CC services?: No  ADL Screening (condition at time of admission) Patient's cognitive ability adequate to safely complete daily activities?: Yes Is the patient deaf or have difficulty hearing?: No Does the patient have difficulty seeing, even when wearing glasses/contacts?: No Does the patient have difficulty concentrating, remembering, or making decisions?: No Patient able to express need for assistance with ADLs?: Yes Does the patient have difficulty dressing or bathing?: No Independently performs ADLs?: Yes (appropriate for developmental age) Does the patient have difficulty walking or climbing stairs?: No Weakness of Legs: None Weakness of Arms/Hands: None  Home Assistive Devices/Equipment Home Assistive Devices/Equipment: None    Abuse/Neglect Assessment (Assessment to be complete while patient is alone) Physical Abuse: Denies Verbal Abuse: Denies Sexual Abuse:  Denies Exploitation of patient/patient's resources: Denies Self-Neglect: Denies     Merchant navy officer (For Healthcare) Does patient have an advance directive?: No Would patient like information on creating an advanced directive?: No - patient declined information    Additional Information 1:1 In Past 12 Months?: No CIRT Risk: No Elopement Risk: No Does patient have medical clearance?: Yes     Disposition: Orlean Patten, AC at [facility], confirmed adult unit is at capacity. Gave clinical report to Maryjean Morn, PA who said Pt meets criteria for inpatient psychiatric treatment. TTS will contact other facilities for placement. Notified Antony Madura, PA-C and Gladstone Lighter, RN of recommendation.  Disposition Initial Assessment Completed for this Encounter: Yes Disposition of Patient: Inpatient treatment program Type of inpatient treatment program: Adult   Pamalee Leyden, [facility], [facility], [facility] Triage Specialist [phone]   Patsy Baltimore, Harlin Rain 05/12/2016 1:00 AM

## 2016-05-12 NOTE — ED Provider Notes (Signed)
CSN: [id number]     Arrival date & time 05/12/16  0004 History  By signing my name below, I, Chad Sanchez, attest that this documentation has been prepared under the direction and in the presence of TRW AutomotiveKelly Rashee Marschall, PA-C. Electronically Signed: Bridgette HabermannMaria Sanchez, ED Scribe. 05/12/2016. 12:29 AM.   Chief Complaint  Patient presents with  . Suicidal   The history is provided by the patient. No language interpreter was used.   HPI Comments: Chad Sanchez is a 45 y.o. male with h/o alcohol abuse, depression, and anxiety who presents to the Emergency Department by [employer] for suicidal ideations. Pt called 911 from a bridge with thoughts of jumping and committing suicide. Pt states he's been on his medications for 10 years and feels like they aren't working. Pt states he is also nauseous and feels sick and this is usually brought on when he is feeling more anxious. Pt has drank several beers. He has attempted suicide before recently. He denies homicidal ideations. Pt denies any elicit drug use.  Past Medical History  Diagnosis Date  . Alcohol abuse   . Depression   . Anxiety    Past Surgical History  Procedure Laterality Date  . Tonsillectomy     No family history on file. Social History  Substance Use Topics  . Smoking status: Current Every Day Smoker -- 1.00 packs/day    Types: Cigarettes  . Smokeless tobacco: Never Used  . Alcohol Use: 0.6 oz/week    1 Cans of beer per week     Comment: a case a day; pt told this writer 12 pk daily or a fifth of vodka    Review of Systems  Constitutional: Negative for fever.  Gastrointestinal: Positive for nausea.  Psychiatric/Behavioral: Positive for suicidal ideas and behavioral problems. The patient is nervous/anxious.   All other systems reviewed and are negative.   Allergies  Shellfish allergy  Home Medications   Prior to Admission medications   Medication Sig Start Date End Date Taking? Authorizing Provider  Multiple Vitamin (MULTIVITAMIN WITH  MINERALS) TABS tablet Take 1 tablet by mouth daily. 04/20/16  Yes Thermon LeylandLaura A Davis, NP  sertraline (ZOLOFT) 50 MG tablet Take 1 tablet (50 mg total) by mouth daily. 04/20/16  Yes Thermon LeylandLaura A Davis, NP  traZODone (DESYREL) 50 MG tablet Take 1 tablet (50 mg total) by mouth at bedtime as needed for sleep. 04/20/16  Yes Thermon LeylandLaura A Davis, NP   BP 132/92 mmHg  Pulse 101  Temp(Src) 97.9 F (36.6 C) (Oral)  Resp 18  Ht 6\' 2"  (1.88 m)  Wt 72.576 kg  BMI 20.53 kg/m2  SpO2 97%   Physical Exam  Constitutional: He is oriented to person, place, and time. He appears well-developed and well-nourished. No distress.  HENT:  Head: Normocephalic and atraumatic.  Eyes: Conjunctivae and EOM are normal. No scleral icterus.  Neck: Normal range of motion.  Pulmonary/Chest: Effort normal. No respiratory distress.  Musculoskeletal: Normal range of motion.  Neurological: He is alert and oriented to person, place, and time.  Skin: Skin is warm and dry. No rash noted. He is not diaphoretic. No erythema. No pallor.  Psychiatric: His speech is normal and behavior is normal. He exhibits a depressed mood. He expresses suicidal ideation. He expresses suicidal plans (jumping off bridge).  Patient tearful  Nursing note and vitals reviewed.   ED Course  Procedures  DIAGNOSTIC STUDIES: Oxygen Saturation is 96% on RA, adequate by my interpretation.    COORDINATION OF CARE: 12:28 AM  Discussed treatment plan with pt at bedside which includes counseling and pt agreed to plan.  Labs Review Labs Reviewed  COMPREHENSIVE METABOLIC PANEL - Abnormal; Notable for the following:    CO2 21 (*)    Total Protein 8.7 (*)    AST 53 (*)    All other components within normal limits  ACETAMINOPHEN LEVEL - Abnormal; Notable for the following:    Acetaminophen (Tylenol), Serum <10 (*)    All other components within normal limits  ETHANOL - Abnormal; Notable for the following:    Alcohol, Ethyl (B) 362 (*)    All other components within  normal limits  CBC WITH DIFFERENTIAL/PLATELET  SALICYLATE LEVEL  URINE RAPID DRUG SCREEN, HOSP PERFORMED   I have personally reviewed and evaluated these lab results as part of my medical decision-making.   MDM   Final diagnoses:  Alcohol-induced mood disorder [facility])    Patient presenting for psychiatric evaluation. He has been medically cleared. TTS recommend inpatient treatment; no beds at [facility], so seeking placement. Disposition to be determined by oncoming ED provider.  I personally performed the services described in this documentation, which was scribed in my presence. The recorded information has been reviewed and is accurate.    Filed Vitals:   05/12/16 0013 05/12/16 0142  BP: 136/100 132/92  Pulse: 107 101  Temp: 97.9 F (36.6 C)   TempSrc: Oral   Resp: 20 18  Height:  (1.88 m)   Weight: 72.576 kg   SpO2: 96% 97%     Antony Madura, PA-C 05/12/16 0450  Azalia Bilis, MD 05/12/16 [phone]

## 2016-05-12 NOTE — [facility] Suicide Risk Assessment (Signed)
Suicide Risk Assessment  Discharge Assessment   [facility]   Principal Problem: Alcohol-induced mood disorder [facility](HCC) Discharge Diagnoses:  Patient Active Problem List   Diagnosis Date Noted  . Alcohol-induced mood disorder (HCC) [F10.94] 12/16/2015    Priority: High  . Alcohol dependence with uncomplicated withdrawal (HCC) [F10.230] 12/16/2015    Priority: High  . MDD (major depressive disorder), recurrent severe, without psychosis (HCC) [F33.2] 05/03/2016  . GAD (generalized anxiety disorder) [F41.1] 01/14/2014    Total Time spent with patient: 45 minutes   Musculoskeletal: Strength & Muscle Tone: within normal limits Gait & Station: normal Patient leans: N/A  Psychiatric Specialty Exam: Physical Exam  Constitutional: He is oriented to person, place, and time. He appears well-developed and well-nourished.  HENT:  Head: Normocephalic.  Neck: Normal range of motion.  Respiratory: Effort normal.  Musculoskeletal: Normal range of motion.  Neurological: He is alert and oriented to person, place, and time.  Skin: Skin is warm and dry.  Psychiatric: He has a normal mood and affect. His speech is normal and behavior is normal. Judgment and thought content normal. Cognition and memory are normal.    Review of Systems  Constitutional: Negative.   HENT: Negative.   Eyes: Negative.   Respiratory: Negative.   Cardiovascular: Negative.   Gastrointestinal: Negative.   Genitourinary: Negative.   Musculoskeletal: Negative.   Skin: Negative.   Neurological: Negative.   Endo/Heme/Allergies: Negative.   Psychiatric/Behavioral: Positive for substance abuse.    Blood pressure 117/77, pulse 104, temperature 98.4 F (36.9 C), temperature source Oral, resp. rate 18, height 6\' 2"  (1.88 m), weight 72.576 kg (160 lb), SpO2 98 %.Body mass index is 20.53 kg/(m^2).  General Appearance: Casual  Eye Contact:  Good  Speech:  Normal Rate  Volume:  Normal  Mood:   Depressed, mild  Affect:  Congruent  Thought Process:  Coherent and Descriptions of Associations: Intact  Orientation:  Full (Time, Place, and Person)  Thought Content:  WDL  Suicidal Thoughts:  No  Homicidal Thoughts:  No  Memory:  Immediate;   Good Recent;   Good Remote;   Good  Judgement:  Fair  Insight:  Fair  Psychomotor Activity:  Normal  Concentration:  Concentration: Good and Attention Span: Good  Recall:  Good  Fund of Knowledge:  Good  Language:  Good  Akathisia:  No  Handed:  Right  AIMS (if indicated):     Assets:  Housing Leisure Time Physical Health Resilience Social Support  ADL's:  Intact  Cognition:  WNL  Sleep:       Mental Status Per Nursing Assessment::   On Admission:   alcohol intoxication with suicidal ideations  Demographic Factors:  Male  Loss Factors: NA  Historical Factors: NA  Risk Reduction Factors:   Sense of responsibility to family, Living with another person, especially a relative, Positive social support and Positive therapeutic relationship  Continued Clinical Symptoms:  Depression, mild  Cognitive Features That Contribute To Risk:  None    Suicide Risk:  Minimal: No identifiable suicidal ideation.  Patients presenting with no risk factors but with morbid ruminations; may be classified as minimal risk based on the severity of the depressive symptoms    Plan Of Care/Follow-up recommendations:  Activity:  as tolerated Diet:  heart healthy diet  LORD, JAMISON, NP 05/12/2016, 11:43 AM

## 2016-05-12 NOTE — Discharge Instructions (Signed)
Alcohol Withdrawal  When a person who drinks a lot of alcohol stops drinking, he or she may go through alcohol withdrawal. Alcohol withdrawal causes problems. It can make you feel:  · Tired (fatigued).  · Sad (depressed).  · Fearful (anxious).  · Grouchy (irritable).  · Not hungry.  · Sick to your stomach (nauseous).  · Shaky.  It can also make you have:  · Nightmares.  · Trouble sleeping.  · Trouble thinking clearly.  · Mood swings.  · Clammy skin.  · Very bad sweating.  · A very fast heartbeat.  · Shaking that you cannot control (tremor).  · Having a fever.  · A fit of movements that you cannot control (seizure).  · Confusion.  · Throwing up (vomiting).  · Feeling or seeing things that are not there (hallucinations).  HOME CARE  · Take medicines and vitamins only as told by your doctor.  · Do not drink alcohol.  · Have someone around in case you need help.  · Drink enough fluid to keep your pee (urine) clear or pale yellow.  · Think about joining a group to help you stop drinking.  GET HELP IF:  · Your problems get worse.  · Your problems do not go away.  · You cannot eat or drink without throwing up.  · You are having a hard time not drinking alcohol.  · You cannot stop drinking alcohol.  GET HELP RIGHT AWAY IF:  · You feel your heart beating differently than usual.  · Your chest hurts.  · You have trouble breathing.  · You have very bad problems, like:    A fever.    A fit of movements that you cannot control.    Being very confused.    Feeling or seeing things that are not there.     This information is not intended to replace advice given to you by your health care provider. Make sure you discuss any questions you have with your health care provider.     Document Released: 04/08/2008 Document Revised: 11/11/2014 Document Reviewed: 08/09/2014  Elsevier Interactive Patient Education ©2016 [employer]vier Inc[employer]

## 2016-05-12 NOTE — ED Notes (Signed)
Pt transported via [employer] from bridge, pt called 911 from bridge with thoughts of jumping with hopes of dying. Pt reporting etoh usage today. [place] and has arranged assistance through their mental health resources.

## 2016-05-12 NOTE — Consult Note (Signed)
[name]   Reason for Consult:  Alcohol intoxication with suicidal ideations Referring Physician:  EDP Patient Identification: Chad Sanchez MRN:  [id number] Principal Diagnosis: Alcohol-induced mood disorder ([place]) Diagnosis:   Patient Active Problem List   Diagnosis Date Noted  . Alcohol-induced mood disorder ([place]) [F10.94] 12/16/2015    Priority: High  . Alcohol dependence with uncomplicated withdrawal ([place]) [F10.230] 12/16/2015    Priority: High  . MDD (major depressive disorder), recurrent severe, without psychosis ([place]) [F33.2] 05/03/2016  . GAD (generalized anxiety disorder) [F41.1] 01/14/2014    Total Time spent with patient: 45 minutes  Subjective:   Chad Sanchez is a 45 y.o. male patient does not warrant admission.  HPI:  45 yo male who presented to the ED with alcohol intoxication and suicidal ideations, also using cocaine.  Today, he is clear and coherent, denying suicidal/homicidal ideations, hallucinations, withdrawal symptoms, and past suicide attempts.  [name] was the tenth anniversary of his brother's death.  He is living with his cousin and returning to [place] [facility] [place] on Tuesday where he is originally.  [name] has been on Zoloft 50 mg daily for depression and Trazodone 50 mg at bedtime for sleep for many years and feels it is not working as well.  Medications adjusted.  Stable for discharge.    Past Psychiatric History: depression, substance abuse  Risk to Self: Suicidal Ideation: Yes-Currently Present Suicidal Intent: Yes-Currently Present Is patient at risk for suicide?: Yes Suicidal Plan?: Yes-Currently Present Specify Current Suicidal Plan: Jump from a bridge Access to Means: Yes Specify Access to Suicidal Means: Law enforcement picked up Pt on bridge What has been your use of drugs/alcohol within the last 12 months?: Pt is using alcohol and cocaine How many times?: 5 (Multiple) Other Self Harm Risks: None Triggers  for Past Attempts: Unpredictable, Other (Comment) Intentional Self Injurious Behavior: None Risk to Others: Homicidal Ideation: No Thoughts of Harm to Others: No Current Homicidal Intent: No Current Homicidal Plan: No Access to Homicidal Means: No Identified Victim: None History of harm to others?: No Assessment of Violence: None Noted Violent Behavior Description: Pt denies history of violence Does patient have access to weapons?: No Criminal Charges Pending?: No Does patient have a court date: No Prior Inpatient Therapy: Prior Inpatient Therapy: Yes Prior Therapy Dates: 2015, 2014, 2011 Prior Therapy Facilty/Provider(s): [facility] Reason for Treatment: Substance abuse, depression  Prior Outpatient Therapy: Prior Outpatient Therapy: Yes Prior Therapy Dates: Current Prior Therapy Facilty/Provider(s): [facility] Reason for Treatment: Depression Does patient have an ACCT team?: No Does patient have Intensive In-House Services?  : No Does patient have [facility] services? : Yes Does patient have P4CC services?: No  Past Medical History:  Past Medical History  Diagnosis Date  . Alcohol abuse   . Depression   . Anxiety     Past Surgical History  Procedure Laterality Date  . Tonsillectomy     Family History: No family history on file. Family Psychiatric  History: none Social History:  History  Alcohol Use  . 0.6 oz/week  . 1 Cans of beer per week    Comment: a case a day; pt told this writer 12 pk daily or a fifth of vodka     History  Drug Use  . Yes  . Special: "Crack" cocaine, Cocaine    Comment: Pt endorses using crack cocaine    Social History   Social History  . Marital Status: Single    Spouse Name: N/A  . Number  of Children: N/A  . Years of Education: N/A   Social History Main Topics  . Smoking status: Current Every Day Smoker -- 1.00 packs/day    Types: Cigarettes  . Smokeless tobacco: Never Used  . Alcohol Use: 0.6 oz/week    1 Cans of beer per  week     Comment: a case a day; pt told this writer 12 pk daily or a fifth of vodka  . Drug Use: Yes    Special: "Crack" cocaine, Cocaine     Comment: Pt endorses using crack cocaine  . Sexual Activity: Not Currently   Other Topics Concern  . None   Social History Narrative   Additional Social History:    Allergies:   Allergies  Allergen Reactions  . Shellfish Allergy Anaphylaxis    All seafood    Labs:  Results for orders placed or performed during the hospital encounter of 05/12/16 (from the past 48 hour(s))  CBC with Differential     Status: None   Collection Time: 05/12/16 12:27 AM  Result Value Ref Range   WBC 5.3 4.0 - 10.5 K/uL   RBC 5.48 4.22 - 5.81 MIL/uL   Hemoglobin 15.4 13.0 - 17.0 g/dL   HCT 44.8 39.0 - 52.0 %   MCV 81.8 78.0 - 100.0 fL   MCH 28.1 26.0 - 34.0 pg   MCHC 34.4 30.0 - 36.0 g/dL   RDW 14.3 11.5 - 15.5 %   Platelets 237 150 - 400 K/uL   Neutrophils Relative % 43 %   Neutro Abs 2.3 1.7 - 7.7 K/uL   Lymphocytes Relative 49 %   Lymphs Abs 2.6 0.7 - 4.0 K/uL   Monocytes Relative 8 %   Monocytes Absolute 0.4 0.1 - 1.0 K/uL   Eosinophils Relative 0 %   Eosinophils Absolute 0.0 0.0 - 0.7 K/uL   Basophils Relative 0 %   Basophils Absolute 0.0 0.0 - 0.1 K/uL  Comprehensive metabolic panel     Status: Abnormal   Collection Time: 05/12/16 12:27 AM  Result Value Ref Range   Sodium 138 135 - 145 mmol/L   Potassium 3.8 3.5 - 5.1 mmol/L   Chloride 103 101 - 111 mmol/L   CO2 21 (L) 22 - 32 mmol/L   Glucose, Bld 85 65 - 99 mg/dL   BUN 9 6 - 20 mg/dL   Creatinine, Ser 0.99 0.61 - 1.24 mg/dL   Calcium 8.9 8.9 - 10.3 mg/dL   Total Protein 8.7 (H) 6.5 - 8.1 g/dL   Albumin 4.8 3.5 - 5.0 g/dL   AST 53 (H) 15 - 41 U/L   ALT 41 17 - 63 U/L   Alkaline Phosphatase 54 38 - 126 U/L   Total Bilirubin 0.6 0.3 - 1.2 mg/dL   GFR calc non Af Amer >60 >60 mL/min   GFR calc Af Amer >60 >60 mL/min    Comment: (NOTE) The eGFR has been calculated using the CKD EPI  equation. This calculation has not been validated in all clinical situations. eGFR's persistently <60 mL/min signify possible Chronic Kidney Disease.    Anion gap 14 5 - 15  Acetaminophen level     Status: Abnormal   Collection Time: 05/12/16 12:27 AM  Result Value Ref Range   Acetaminophen (Tylenol), Serum <10 (L) 10 - 30 ug/mL    Comment:        THERAPEUTIC CONCENTRATIONS VARY SIGNIFICANTLY. A RANGE OF 10-30 ug/mL MAY BE AN EFFECTIVE CONCENTRATION FOR MANY PATIENTS. HOWEVER, SOME ARE  BEST TREATED AT CONCENTRATIONS OUTSIDE THIS RANGE. ACETAMINOPHEN CONCENTRATIONS >150 ug/mL AT 4 HOURS AFTER INGESTION AND >50 ug/mL AT 12 HOURS AFTER INGESTION ARE OFTEN ASSOCIATED WITH TOXIC REACTIONS.   Salicylate level     Status: None   Collection Time: 05/12/16 12:27 AM  Result Value Ref Range   Salicylate Lvl <9.2 2.8 - 30.0 mg/dL  Ethanol     Status: Abnormal   Collection Time: 05/12/16 12:27 AM  Result Value Ref Range   Alcohol, Ethyl (B) 362 (HH) <5 mg/dL    Comment:        LOWEST DETECTABLE LIMIT FOR SERUM ALCOHOL IS 5 mg/dL FOR MEDICAL PURPOSES ONLY CRITICAL RESULT CALLED TO, READ BACK BY AND VERIFIED WITH: A DENNIS RN 0103 05/12/16 A NAVARRO   Rapid urine drug screen (hospital performed)     Status: Abnormal   Collection Time: 05/12/16 10:50 AM  Result Value Ref Range   Opiates NONE DETECTED NONE DETECTED   Cocaine NONE DETECTED NONE DETECTED   Benzodiazepines POSITIVE (A) NONE DETECTED   Amphetamines POSITIVE (A) NONE DETECTED   Tetrahydrocannabinol NONE DETECTED NONE DETECTED   Barbiturates NONE DETECTED NONE DETECTED    Comment:        DRUG SCREEN FOR MEDICAL PURPOSES ONLY.  IF CONFIRMATION IS NEEDED FOR ANY PURPOSE, NOTIFY LAB WITHIN 5 DAYS.        LOWEST DETECTABLE LIMITS FOR URINE DRUG SCREEN Drug Class       Cutoff (ng/mL) Amphetamine      1000 Barbiturate      200 Benzodiazepine   426 Tricyclics       834 Opiates          300 Cocaine          300 THC               50     Current Facility-Administered Medications  Medication Dose Route Frequency Provider Last Rate Last Dose  . LORazepam (ATIVAN) tablet 0-4 mg  0-4 mg Oral Q6H Antonietta Breach, PA-C   0 mg at 05/12/16 0144   Followed by  . [START ON 05/14/2016] LORazepam (ATIVAN) tablet 0-4 mg  0-4 mg Oral Q12H Antonietta Breach, PA-C      . sertraline (ZOLOFT) tablet 100 mg  100 mg Oral Daily Patrecia Pour, NP      . traZODone (DESYREL) tablet 100 mg  100 mg Oral QHS Patrecia Pour, NP       Current Outpatient Prescriptions  Medication Sig Dispense Refill  . Multiple Vitamin (MULTIVITAMIN WITH MINERALS) TABS tablet Take 1 tablet by mouth daily.    . sertraline (ZOLOFT) 50 MG tablet Take 1 tablet (50 mg total) by mouth daily. 15 tablet 0  . traZODone (DESYREL) 50 MG tablet Take 1 tablet (50 mg total) by mouth at bedtime as needed for sleep. 15 tablet 0    Musculoskeletal: Strength & Muscle Tone: within normal limits Gait & Station: normal Patient leans: N/A  Psychiatric Specialty Exam: Physical Exam  Constitutional: He is oriented to person, place, and time. He appears well-developed and well-nourished.  HENT:  Head: Normocephalic.  Neck: Normal range of motion.  Respiratory: Effort normal.  Musculoskeletal: Normal range of motion.  Neurological: He is alert and oriented to person, place, and time.  Skin: Skin is warm and dry.  Psychiatric: He has a normal mood and affect. His speech is normal and behavior is normal. Judgment and thought content normal. Cognition and memory are normal.  Review of Systems  Constitutional: Negative.   HENT: Negative.   Eyes: Negative.   Respiratory: Negative.   Cardiovascular: Negative.   Gastrointestinal: Negative.   Genitourinary: Negative.   Musculoskeletal: Negative.   Skin: Negative.   Neurological: Negative.   Endo/Heme/Allergies: Negative.   Psychiatric/Behavioral: Positive for substance abuse.    Blood pressure 117/77, pulse 104,  temperature 98.4 F (36.9 C), temperature source Oral, resp. rate 18, height _0  (1.88 m), weight 72.576 kg (160 lb), SpO2 98 %.Body mass index is 20.53 kg/(m^2).  General Appearance: Casual  Eye Contact:  Good  Speech:  Normal Rate  Volume:  Normal  Mood:  Depressed, mild  Affect:  Congruent  Thought Process:  Coherent and Descriptions of Associations: Intact  Orientation:  Full (Time, Place, and Person)  Thought Content:  WDL  Suicidal Thoughts:  No  Homicidal Thoughts:  No  Memory:  Immediate;   Good Recent;   Good Remote;   Good  Judgement:  Fair  Insight:  Fair  Psychomotor Activity:  Normal  Concentration:  Concentration: Good and Attention Span: Good  Recall:  Good  Fund of Knowledge:  Good  Language:  Good  Akathisia:  No  Handed:  Right  AIMS (if indicated):     Assets:  Housing Leisure Time Physical Health Resilience Social Support  ADL's:  Intact  Cognition:  WNL  Sleep:        Treatment Plan Summary: Daily contact with patient to assess and evaluate symptoms and progress in treatment, Medication management and Plan alcohol induced mood disorder:  -Crisis stabilization -Medication management:  Zoloft 50 mg daily for depression increased to 100 mg, Trazodone 50 mg increased to 100 mg for sleep -Individual and substance abuse counseling  Disposition: No evidence of imminent risk to self or others at present.    Waylan Boga, NP 05/12/2016 11:35 AM Patient seen face-to-face for psychiatric evaluation, chart reviewed and case discussed with the physician extender and developed treatment plan. Reviewed the information documented and agree with the treatment plan. Corena Pilgrim, MD

## 2016-05-17 ENCOUNTER — Encounter (HOSPITAL_COMMUNITY): Payer: Self-pay | Admitting: Emergency Medicine

## 2016-05-17 ENCOUNTER — Observation Stay (HOSPITAL_COMMUNITY)
Admission: AD | Admit: 2016-05-17 | Discharge: 2016-05-18 | Disposition: A | Discharge status: Home / Self Care | Payer: Federal, State, Local not specified - Other | Source: Intra-hospital | Attending: Psychiatry | Admitting: Psychiatry

## 2016-05-17 ENCOUNTER — Emergency Department (HOSPITAL_COMMUNITY)
Admission: EM | Admit: 2016-05-17 | Discharge: 2016-05-17 | Disposition: A | Discharge status: Other Acute Inpatient Hospital | Payer: Self-pay | Attending: Emergency Medicine | Admitting: Emergency Medicine

## 2016-05-17 ENCOUNTER — Encounter (HOSPITAL_COMMUNITY): Payer: Self-pay | Admitting: *Deleted

## 2016-05-17 DIAGNOSIS — I10 Essential (primary) hypertension: Secondary | ICD-10-CM | POA: Insufficient documentation

## 2016-05-17 DIAGNOSIS — F1010 Alcohol abuse, uncomplicated: Secondary | ICD-10-CM

## 2016-05-17 DIAGNOSIS — Z91013 Allergy to seafood: Secondary | ICD-10-CM | POA: Insufficient documentation

## 2016-05-17 DIAGNOSIS — F329 Major depressive disorder, single episode, unspecified: Secondary | ICD-10-CM | POA: Insufficient documentation

## 2016-05-17 DIAGNOSIS — F1490 Cocaine use, unspecified, uncomplicated: Secondary | ICD-10-CM | POA: Insufficient documentation

## 2016-05-17 DIAGNOSIS — F1094 Alcohol use, unspecified with alcohol-induced mood disorder: Secondary | ICD-10-CM

## 2016-05-17 DIAGNOSIS — Z9114 Patient's other noncompliance with medication regimen: Secondary | ICD-10-CM | POA: Insufficient documentation

## 2016-05-17 DIAGNOSIS — F10129 Alcohol abuse with intoxication, unspecified: Secondary | ICD-10-CM | POA: Insufficient documentation

## 2016-05-17 DIAGNOSIS — F17210 Nicotine dependence, cigarettes, uncomplicated: Secondary | ICD-10-CM | POA: Insufficient documentation

## 2016-05-17 DIAGNOSIS — R109 Unspecified abdominal pain: Secondary | ICD-10-CM | POA: Insufficient documentation

## 2016-05-17 DIAGNOSIS — F332 Major depressive disorder, recurrent severe without psychotic features: Secondary | ICD-10-CM | POA: Insufficient documentation

## 2016-05-17 DIAGNOSIS — F1020 Alcohol dependence, uncomplicated: Secondary | ICD-10-CM | POA: Diagnosis present

## 2016-05-17 DIAGNOSIS — F411 Generalized anxiety disorder: Secondary | ICD-10-CM | POA: Diagnosis present

## 2016-05-17 DIAGNOSIS — F10230 Alcohol dependence with withdrawal, uncomplicated: Secondary | ICD-10-CM

## 2016-05-17 DIAGNOSIS — F1024 Alcohol dependence with alcohol-induced mood disorder: Principal | ICD-10-CM | POA: Insufficient documentation

## 2016-05-17 DIAGNOSIS — F10920 Alcohol use, unspecified with intoxication, uncomplicated: Secondary | ICD-10-CM

## 2016-05-17 DIAGNOSIS — D571 Sickle-cell disease without crisis: Secondary | ICD-10-CM | POA: Insufficient documentation

## 2016-05-17 DIAGNOSIS — Z79899 Other long term (current) drug therapy: Secondary | ICD-10-CM | POA: Insufficient documentation

## 2016-05-17 HISTORY — DX: Sickle-cell disease without crisis: D57.1

## 2016-05-17 HISTORY — DX: Essential (primary) hypertension: I10

## 2016-05-17 LAB — CBC WITH DIFFERENTIAL/PLATELET
Basophils Absolute: 0 10*3/uL (ref 0.0–0.1)
Basophils Relative: 1 %
EOS PCT: 1 %
Eosinophils Absolute: 0 10*3/uL (ref 0.0–0.7)
HCT: 42.8 % (ref 39.0–52.0)
HEMOGLOBIN: 14.7 g/dL (ref 13.0–17.0)
LYMPHS ABS: 2 10*3/uL (ref 0.7–4.0)
LYMPHS PCT: 54 %
MCH: 27.9 pg (ref 26.0–34.0)
MCHC: 34.3 g/dL (ref 30.0–36.0)
MCV: 81.2 fL (ref 78.0–100.0)
MONO ABS: 0.4 10*3/uL (ref 0.1–1.0)
MONOS PCT: 11 %
Neutro Abs: 1.2 10*3/uL — ABNORMAL LOW (ref 1.7–7.7)
Neutrophils Relative %: 33 %
PLATELETS: 192 10*3/uL (ref 150–400)
RBC: 5.27 MIL/uL (ref 4.22–5.81)
RDW: 14.2 % (ref 11.5–15.5)
WBC: 3.7 10*3/uL — ABNORMAL LOW (ref 4.0–10.5)

## 2016-05-17 LAB — RAPID URINE DRUG SCREEN, HOSP PERFORMED
AMPHETAMINES: NOT DETECTED
BENZODIAZEPINES: NOT DETECTED
Barbiturates: NOT DETECTED
Cocaine: POSITIVE — AB
Opiates: NOT DETECTED
TETRAHYDROCANNABINOL: NOT DETECTED

## 2016-05-17 LAB — ETHANOL: Alcohol, Ethyl (B): 284 mg/dL — ABNORMAL HIGH (ref <5)

## 2016-05-17 LAB — COMPREHENSIVE METABOLIC PANEL
ALBUMIN: 4.5 g/dL (ref 3.5–5.0)
ALK PHOS: 52 U/L (ref 38–126)
ALT: 27 U/L (ref 17–63)
AST: 39 U/L (ref 15–41)
Anion gap: 14 (ref 5–15)
BUN: 7 mg/dL (ref 6–20)
CALCIUM: 8.9 mg/dL (ref 8.9–10.3)
CO2: 20 mmol/L — AB (ref 22–32)
CREATININE: 0.83 mg/dL (ref 0.61–1.24)
Chloride: 103 mmol/L (ref 101–111)
GFR calc non Af Amer: >60 mL/min (ref >60)
GLUCOSE: 84 mg/dL (ref 65–99)
Potassium: 3.8 mmol/L (ref 3.5–5.1)
Sodium: 137 mmol/L (ref 135–145)
Total Bilirubin: 0.5 mg/dL (ref 0.3–1.2)
Total Protein: 8.1 g/dL (ref 6.5–8.1)

## 2016-05-17 MED ORDER — LORAZEPAM 1 MG PO TABS
0.0000 mg | ORAL_TABLET | Freq: Four times a day (QID) | ORAL | Status: DC
Start: 2016-05-18 — End: 2016-05-18
  Administered 2016-05-18 (×2): 1 mg via ORAL
  Filled 2016-05-17: qty 1
  Filled 2016-05-17: qty 2
  Filled 2016-05-17: qty 1

## 2016-05-17 MED ORDER — FLUOXETINE HCL 20 MG PO CAPS
20.0000 mg | ORAL_CAPSULE | Freq: Every day | ORAL | Status: DC
  Administered 2016-05-17: 20 mg via ORAL
  Filled 2016-05-17: qty 1

## 2016-05-17 MED ORDER — GABAPENTIN 100 MG PO CAPS
200.0000 mg | ORAL_CAPSULE | Freq: Two times a day (BID) | ORAL | Status: DC
  Administered 2016-05-17: 200 mg via ORAL
  Filled 2016-05-17: qty 2

## 2016-05-17 MED ORDER — ACETAMINOPHEN 325 MG PO TABS: 650.0000 mg | ORAL_TABLET | Freq: Four times a day (QID) | ORAL | Status: DC | PRN

## 2016-05-17 MED ORDER — LORAZEPAM 1 MG PO TABS
0.0000 mg | ORAL_TABLET | Freq: Four times a day (QID) | ORAL | Status: DC
  Administered 2016-05-17: 2 mg via ORAL
  Administered 2016-05-17: 1 mg via ORAL
  Filled 2016-05-17: qty 2
  Filled 2016-05-17: qty 1

## 2016-05-17 MED ORDER — LORAZEPAM 1 MG PO TABS: 0.0000 mg | ORAL_TABLET | Freq: Two times a day (BID) | ORAL | Status: DC

## 2016-05-17 MED ORDER — GABAPENTIN 100 MG PO CAPS
200.0000 mg | ORAL_CAPSULE | Freq: Two times a day (BID) | ORAL | Status: DC
  Administered 2016-05-17 – 2016-05-18 (×2): 200 mg via ORAL
  Filled 2016-05-17 (×2): qty 2

## 2016-05-17 MED ORDER — ALUM & MAG HYDROXIDE-SIMETH 200-200-20 MG/5ML PO SUSP: 30.0000 mL | ORAL | Status: DC | PRN

## 2016-05-17 MED ORDER — MAGNESIUM HYDROXIDE 400 MG/5ML PO SUSP: 30.0000 mL | Freq: Every day | ORAL | Status: DC | PRN

## 2016-05-17 MED ORDER — TRAZODONE HCL 100 MG PO TABS: 100.0000 mg | ORAL_TABLET | Freq: Every day | ORAL | Status: DC

## 2016-05-17 MED ORDER — FLUOXETINE HCL 20 MG PO CAPS
20.0000 mg | ORAL_CAPSULE | Freq: Every day | ORAL | Status: DC
  Administered 2016-05-18: 20 mg via ORAL
  Filled 2016-05-17: qty 1

## 2016-05-17 NOTE — ED Notes (Signed)
Pt states that he is here because of depression and alcohol use. He denies SI/HI. He is not delusional and does not appear to respond to internal stimulus. He is A&Ox4 with a blunted affect. He reports having withdrawal symptoms and is uncomfortable. This Clinical research associatewriter administered Ativan 2 mg based on CIWA = 11.

## 2016-05-17 NOTE — ED Notes (Signed)
Pt brought in by EMS with c/o right sided abd pain and pt states he wants detox from alcohol  Pt wants his liver enzymes checked  Pt states his mother passed at age 46 with liver problems   Pt last drank earlier today because he felt shaky as he is trying to come off alcohol by himself  Pt states he has also had vomiting for the past several days

## 2016-05-17 NOTE — BH Assessment (Addendum)
Assessment Note  Chad Sanchez is an 45 y.o. male that presents this date requesting detox from alcohol and cocaine. Patient stated he had "some" thoughts of self harm without a plan (passive) . Patient reports daily use of cocaine (up to one gram daily) and alcohol (daily use 6 to 10 12 oz beers) Pt reported that he has been dealing with depression for a "long time." Patient is reporting multiple depressive symptoms to include: hopelessness, isolating and "feeling lost."  Per notes: "Pt reported that his sleep and appetite have been poor. Pt denies HI and AVH at this time. Pt denies any criminal charges or legal. Pt states he receives services from [facility] but cannot recall when he was there last. Pt denies any physical, sexual or emotional abuse on admission. Patient stated he cannot quit drinking "on his own." Patient report current withdrawals to include: tremors, agitation and nausea. Patient denies SI/HIand states he wants an admission to detox from alcohol because he "doesn't have any other place to go." Per Thedore Mins, MD, this pt would benefit from admission to the [facility] Observation Unit at this time.Berneice Heinrich, RN, [facility] has assigned pt to Obs 4.Pt has signed Voluntary Admission and Consent for Treatment, as well as Consent to Release Information to no one, and signed forms have been faxed to [facility]. Pt's nurse, Diane, has been notified, and agrees to send original paperwork along with pt via Juel Burrow, and to call report to [phone] or [phone].  Diagnosis: MDD recurrent moderate, Polysubstance abuse severe  Past Medical History:  Past Medical History  Diagnosis Date  . Alcohol abuse   . Depression   . Anxiety   . Hypertension   . Sickle cell anemia [facility])     Past Surgical History  Procedure Laterality Date  . Tonsillectomy      Family History:  Family History  Problem Relation Age of Onset  . Cirrhosis Mother     Social History:  reports that he has been smoking  Cigarettes.  He has been smoking about 0.50 packs per day. He has never used smokeless tobacco. He reports that he drinks about 0.6 oz of alcohol per week. He reports that he uses illicit drugs ("Crack" cocaine and Cocaine).  Additional Social History:  Alcohol / Drug Use Pain Medications: See MAR Prescriptions: See MAR Over the Counter: See MAR History of alcohol / drug use?: Yes Longest period of sobriety (when/how long): pt cannot remember Withdrawal Symptoms: Agitation Substance #1 Name of Substance 1: Cocaine  1 - Age of First Use: 17 1 - Amount (size/oz): 1 gram 1 - Frequency: daily 1 - Duration: last 5 years 1 - Last Use / Amount: 05/16/16 pt reported using 1 gram Substance #2 Name of Substance 2: ETOH 2 - Age of First Use: 20 2 - Amount (size/oz): pt reports consuming 6 to 10 12oz beers daily 2 - Frequency: daily 2 - Duration: "pt states a long time" 2 - Last Use / Amount: Pt reports consuming 4 12oz beers on 05/16/16   CIWA: CIWA-Ar BP: 127/84 mmHg Pulse Rate: 94 Nausea and Vomiting: mild nausea with no vomiting Tactile Disturbances: very mild itching, pins and needles, burning or numbness Tremor: two Auditory Disturbances: not present Paroxysmal Sweats: no sweat visible Visual Disturbances: not present Anxiety: three Headache, Fullness in Head: very mild Agitation: three Orientation and Clouding of Sensorium: oriented and can do serial additions CIWA-Ar Total: 11 COWS:    Allergies:  Allergies  Allergen Reactions  . Fish  Allergy Anaphylaxis  . Shellfish Allergy Anaphylaxis    All seafood    Home Medications:  (Not in a hospital admission)  OB/GYN Status:  No LMP for male patient.  General Assessment Data Location of Assessment: [facility] ED TTS Assessment: In system Is this a Tele or Face-to-Face Assessment?: Face-to-Face Is this an Initial Assessment or a Re-assessment for this encounter?: Initial Assessment Marital status: Single Maiden name: na Is  patient pregnant?: No Pregnancy Status: No Living Arrangements: Other relatives Can pt return to current living arrangement?: Yes Admission Status: Voluntary Is patient capable of signing voluntary admission?: Yes Referral Source: Self/Family/Friend Insurance type: self pay  Medical Screening Exam [facility] Walk-in ONLY) Medical Exam completed: Yes  Crisis Care Plan Living Arrangements: Other relatives Legal Guardian: Other: (none) Name of Psychiatrist: [facility] Name of Therapist: None  Education Status Is patient currently in school?: No Current Grade: na Highest grade of school patient has completed: Some college Name of school: N/A Contact person: N/A  Risk to self with the past 6 months Suicidal Ideation: Yes-Currently Present Has patient been a risk to self within the past 6 months prior to admission? : Yes Suicidal Intent: Yes-Currently Present Has patient had any suicidal intent within the past 6 months prior to admission? : Yes Is patient at risk for suicide?: Yes Suicidal Plan?: Yes-Currently Present Has patient had any suicidal plan within the past 6 months prior to admission? : Yes Specify Current Suicidal Plan: Overdose Access to Means: Yes Specify Access to Suicidal Means: pt has medications What has been your use of drugs/alcohol within the last 12 months?: Current use Previous Attempts/Gestures: Yes How many times?: 6 Other Self Harm Risks: None Triggers for Past Attempts: Unpredictable, Other (Comment) Intentional Self Injurious Behavior: None Family Suicide History: Unknown Recent stressful life event(s): Other (Comment) (MH issues) Persecutory voices/beliefs?: No Depression: Yes Depression Symptoms: Feeling worthless/self pity, Loss of interest in usual pleasures Substance abuse history and/or treatment for substance abuse?: Yes Suicide prevention information given to non-admitted patients: Not applicable  Risk to Others within the past 6  months Homicidal Ideation: No Does patient have any lifetime risk of violence toward others beyond the six months prior to admission? : No Thoughts of Harm to Others: No Current Homicidal Intent: No Current Homicidal Plan: No Access to Homicidal Means: No Identified Victim: na History of harm to others?: No Assessment of Violence: None Noted Violent Behavior Description: pt denies Does patient have access to weapons?: No Criminal Charges Pending?: No Does patient have a court date: No Is patient on probation?: No  Psychosis Hallucinations: None noted Delusions: None noted  Mental Status Report Appearance/Hygiene: In scrubs Eye Contact: Fair Motor Activity: Freedom of movement Speech: Logical/coherent Level of Consciousness: Alert Mood: Anxious Affect: Depressed Anxiety Level: Minimal Thought Processes: Coherent, Relevant Judgement: Partial Orientation: Person, Place, Time, Appropriate for developmental age, Situation Obsessive Compulsive Thoughts/Behaviors: None  Cognitive Functioning Concentration: Normal Memory: Recent Intact IQ: Average Insight: Fair Impulse Control: Fair Appetite: Fair Weight Loss: 0 Weight Gain: 0 Sleep: No Change Total Hours of Sleep: 5 Vegetative Symptoms: None  ADLScreening [facility]) Patient's cognitive ability adequate to safely complete daily activities?: Yes Patient able to express need for assistance with ADLs?: Yes Independently performs ADLs?: Yes (appropriate for developmental age)  Prior Inpatient Therapy Prior Inpatient Therapy: Yes Prior Therapy Dates: 2015, 2014, 2011 Prior Therapy Facilty/Provider(s): [facility] Reason for Treatment: Substance abuse, depression   Prior Outpatient Therapy Prior Outpatient Therapy: Yes Prior Therapy Dates:  Current Prior Therapy Facilty/Provider(s): [facility] Reason for Treatment: Depression Does patient have an ACCT team?: No Does patient have Intensive In-House  Services?  : No Does patient have [facility] services? : Yes Does patient have P4CC services?: No  ADL Screening (condition at time of admission) Patient's cognitive ability adequate to safely complete daily activities?: Yes Is the patient deaf or have difficulty hearing?: No Does the patient have difficulty seeing, even when wearing glasses/contacts?: No Does the patient have difficulty concentrating, remembering, or making decisions?: No Patient able to express need for assistance with ADLs?: Yes Does the patient have difficulty dressing or bathing?: No Independently performs ADLs?: Yes (appropriate for developmental age) Does the patient have difficulty walking or climbing stairs?: No Weakness of Legs: None Weakness of Arms/Hands: None  Home Assistive Devices/Equipment Home Assistive Devices/Equipment: None  Therapy Consults (therapy consults require a physician order) PT Evaluation Needed: No OT Evalulation Needed: No SLP Evaluation Needed: No Abuse/Neglect Assessment (Assessment to be complete while patient is alone) Physical Abuse: Denies Verbal Abuse: Denies Sexual Abuse: Denies Exploitation of patient/patient's resources: Denies Self-Neglect: Denies Values / Beliefs Cultural Requests During Hospitalization: None Spiritual Requests During Hospitalization: None Consults Spiritual Care Consult Needed: No Social Work Consult Needed: No Merchant navy officerAdvance Directives (For Healthcare) Does patient have an advance directive?: No Would patient like information on creating an advanced directive?: No - patient declined information (pt declines information)    Additional Information 1:1 In Past 12 Months?: No CIRT Risk: No Elopement Risk: No Does patient have medical clearance?: Yes     Disposition: Per Thedore MinsMojeed Akintayo, MD, this pt [facility] at this [facility].Berneice Heinrichina Tate, RN, [facility] has assigned pt to Obs 4.Pt has signed Voluntary Admission and  Consent for Treatment, as well as Consent to Release Information to no one, and signed [facility]. Pt's nurse, Diane, has been notified, and agrees to send original paperwork along with pt via Juel Burrowelham, and to call report to [phone] or [phone].  Disposition Initial Assessment Completed for this Encounter: Yes Disposition of Patient: Other dispositions Type of inpatient treatment program: Adult Other disposition(s): Other (Comment) (re-evaluated in the a.m.)  On Site Evaluation by:   Reviewed with Physician:    Alfredia Fergusonavid L Jacquelin Krajewski 05/17/2016 4:04 PM

## 2016-05-17 NOTE — Progress Notes (Addendum)
Admission Note:  Patient is a 45 years old male [facility] for alcohol and cocaine detox. On admission, patient appears anxious and worried. Poor personal hygiene/body odor. Patient stated "I am here for detox; alcohol and cocaine.  I drink anything I can and smoke anything I can get on the street. I need help"  Patient was cooperative with admission process. Pt denies SI/HI/AVH at this time.    A:Skin/body search done. Tattoo noted at the right corner of the neck. No wound/bruises noted. Skin dry and intact. POC and unit policies explained and understanding verbalized. Consents obtained. Accepted food and fluids offered..  R: Patient had no additional questions or concerns.

## 2016-05-17 NOTE — BH Assessment (Signed)
[facility] Assessment Progress Note  Per Thedore MinsMojeed Akintayo, MD, this pt [facility].  Berneice Heinrichina Tate, RN, [facility] has assigned pt to Obs 4.  Pt has signed Voluntary Admission and Consent for Treatment, as well as Consent to Release Information to no one, and signed forms [facility].  Pt's nurse, Diane, has been notified, and agrees to send original paperwork along with pt via Juel Burrowelham, and to call report to [phone] or [phone].  Doylene Canninghomas Ngoc Detjen, MA Triage Specialist [phone]

## 2016-05-17 NOTE — Progress Notes (Signed)
[facility] INPATIENT:  Family/Significant Other Suicide Prevention Education  Suicide Prevention Education:  Patient Refusal for Family/Significant Other Suicide Prevention Education: The patient Chad Sanchez has refused to provide written consent for family/significant other to be provided Family/Significant Other Suicide Prevention Education during admission and/or prior to discharge.  Physician notified.  Glenice LaineIbekwe, [place] B 05/17/2016, 11:25 PM

## 2016-05-17 NOTE — ED Notes (Signed)
PATIENT CHANGED INTO SCRUBS AND WANDED BY SECURITY

## 2016-05-17 NOTE — Progress Notes (Signed)
CM spoke with [place] who [place].  CM discussed and provided written information to assist pt with determining choice for uninsured accepting pcps, discussed the importance of pcp vs EDP services for f/u care, www.needymeds.org, www.goodrx.com, discounted pharmacies and other [place] resources such as [facility] , [facility], affordable care act, financial assistance, uninsured dental services, [facility] med assist, DSS and  health department  Reviewed resources for [place] uninsured accepting pcps like [facility], family medicine at [address], [facility], [facility], local urgent care centers, [facility], [facility] family practice, general medical clinics, family services of the [place], [facility] urgent care plus others, medication resources, [facility] out patient pharmacies and housing Pt voiced understanding and appreciation of resources provided   Provided P4CC contact information Pt did not agree to referral to [facility]

## 2016-05-17 NOTE — ED Notes (Signed)
Patient noted in room. No complaints, stable, in no acute distress. Q15 minute rounds and monitoring via security cameras continue for safety. 

## 2016-05-17 NOTE — ED Provider Notes (Signed)
CSN: [id number]     Arrival date & time 05/17/16  [id number] History   First MD Initiated Contact with Patient 05/17/16 0500     Chief Complaint  Patient presents with  . Abdominal Pain  . Alcohol Problem     (Consider location/radiation/quality/duration/timing/severity/associated sxs/prior Treatment) HPI  Pt presents with c/o abdominal pain, vomiting, alcohol withdrawal symptoms.  States he was trying to stop drinking on his own, but had to drink alcohol today because he was shaking.  He also c/o multiple episodes of vomiting.  Diffuse abdominal pain which he describes as cramping.  Denies SI/HI  He states he wants detox from alcohol because he doesn't feel well.  There are no other associated systemic symptoms, there are no other alleviating or modifying factors.   Past Medical History  Diagnosis Date  . Alcohol abuse   . Depression   . Anxiety   . Hypertension   . Sickle cell anemia [facility](HCC)    Past Surgical History  Procedure Laterality Date  . Tonsillectomy     Family History  Problem Relation Age of Onset  . Cirrhosis Mother    Social History  Substance Use Topics  . Smoking status: Current Every Day Smoker -- 0.50 packs/day    Types: Cigarettes  . Smokeless tobacco: Never Used  . Alcohol Use: 0.6 oz/week    1 Cans of beer per week     Comment: a case a day; pt told this writer 12 pk daily or a fifth of vodka    Review of Systems  ROS reviewed and all otherwise negative except for mentioned in HPI    Allergies  Shellfish allergy  Home Medications   Prior to Admission medications   Medication Sig Start Date End Date Taking? Authorizing Provider  Multiple Vitamin (MULTIVITAMIN WITH MINERALS) TABS tablet Take 1 tablet by mouth daily. 04/20/16  Yes Thermon LeylandLaura A Davis, NP  sertraline (ZOLOFT) 100 MG tablet Take 1 tablet (100 mg total) by mouth daily. 05/12/16  Yes Charm RingsJamison Y Lord, NP  traZODone (DESYREL) 100 MG tablet Take 1 tablet (100 mg total) by mouth at bedtime. 05/12/16  Yes  Charm RingsJamison Y Lord, NP   BP 131/99 mmHg  Pulse 89  Temp(Src) 97.9 F (36.6 C)  Resp 19  SpO2 99%  Vitals reviewed Physical Exam  Physical Examination: General appearance - alert, intoxicated appearing, and in no distress Mental status - alert, oriented to person, place, and time Eyes - no conjunctival injection, no scleral icterus Chest - clear to auscultation, no wheezes, rales or rhonchi, symmetric air entry Heart - normal rate, regular rhythm, normal S1, S2, no murmurs, rubs, clicks or gallops Abdomen - soft, nontender, nondistended, no masses or organomegaly Extremities - peripheral pulses normal, no pedal edema, no clubbing or cyanosis Neurologic- intoxicated but conversant, oriented Skin - normal coloration and turgor, no rashes Psych- flat affect  ED Course  Procedures (including critical care time) Labs Review Labs Reviewed  COMPREHENSIVE METABOLIC PANEL - Abnormal; Notable for the following:    CO2 20 (*)    All other components within normal limits  ETHANOL - Abnormal; Notable for the following:    Alcohol, Ethyl (B) 284 (*)    All other components within normal limits  CBC WITH DIFFERENTIAL/PLATELET - Abnormal; Notable for the following:    WBC 3.7 (*)    Neutro Abs 1.2 (*)    All other components within normal limits  URINE RAPID DRUG SCREEN, HOSP PERFORMED - Abnormal; Notable for the following:  Cocaine POSITIVE (*)    All other components within normal limits    Imaging Review No results found. I have personally reviewed and evaluated these images and lab results as part of my medical decision-making.   EKG Interpretation None      MDM   Final diagnoses:  Alcohol intoxication, uncomplicated (HCC)  Alcohol abuse    Pt presenting with alcohol intoxication- he states he wants detox from alcohol- states he has been trying on his own and always has to drink to avoid withdrawal symptoms.  Pt has reassuring labs, with the exception of elevated etoh.  He  denies current SI/HI.  He has no vomiting in the ED.  Psych holding orders written, TTS evaluation requested.  Disposition will be per their recommendations.     Jerelyn Scott, MD 05/17/16 [phone]

## 2016-05-18 DIAGNOSIS — F1094 Alcohol use, unspecified with alcohol-induced mood disorder: Secondary | ICD-10-CM

## 2016-05-18 DIAGNOSIS — R45851 Suicidal ideations: Secondary | ICD-10-CM | POA: Diagnosis not present

## 2016-05-18 MED ORDER — ADULT MULTIVITAMIN W/MINERALS CH: 1.0000 | ORAL_TABLET | Freq: Every day | ORAL | Status: AC

## 2016-05-18 MED ORDER — GABAPENTIN 100 MG PO CAPS: 200.0000 mg | ORAL_CAPSULE | Freq: Two times a day (BID) | ORAL | Status: DC

## 2016-05-18 MED ORDER — TRAZODONE HCL 100 MG PO TABS: 100.0000 mg | ORAL_TABLET | Freq: Every day | ORAL | Status: DC

## 2016-05-18 MED ORDER — SERTRALINE HCL 100 MG PO TABS: 100.0000 mg | ORAL_TABLET | Freq: Every day | ORAL | Status: DC

## 2016-05-18 MED ORDER — FLUOXETINE HCL 20 MG PO CAPS: 20.0000 mg | ORAL_CAPSULE | Freq: Every day | ORAL | Status: DC

## 2016-05-18 NOTE — Discharge Summary (Signed)
                   [facility]-Observation Unit Discharge Summary  [facility] pain and requesting detox from alcohol. The patient recently left the [facility]-Observation Unit AMA on 05/04/2016 after being referred to residential treatment. He was also recently evaluated on 05/12/2016 by Dr. [facility]annifer [facility] at which time he was found stable for discharge. Review of chart indicates that his vital signs are currently stable. Patient was started on an ativan taper due to reports of excessive alcohol use. During assessment today the patient denies any suicidal thoughts. He reports not having any luck getting [place] rescue [place] so [place]. Chad Sanchez reports that he has a cousin to stay with until [facility]. The patient was provided with resource information. He was found stable to discharge. Patient to resume his psychiatric medications and to seek alcohol abuse treatments with provided resources. Chad Sanchez has had several presentations recently for similar symptoms.   Agree with NP Note as above
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
## 2016-05-18 NOTE — Progress Notes (Signed)
Patient stated that he is feeling shaky and sweaty this am. He had some difficulty resting since he next to the door and it is noisy. He stated that he does not feel any SI, HI, or AVH.    Patient safety is maintained through constant monitoring with exception of bathroom times, education, encouragment, and support is offered to patient. Medications were administered.   Patient is receptive and compliant, will continue to monitor.

## 2016-05-19 ENCOUNTER — Encounter (HOSPITAL_COMMUNITY): Payer: Self-pay

## 2016-05-19 DIAGNOSIS — F1010 Alcohol abuse, uncomplicated: Secondary | ICD-10-CM | POA: Insufficient documentation

## 2016-05-19 DIAGNOSIS — I10 Essential (primary) hypertension: Secondary | ICD-10-CM | POA: Insufficient documentation

## 2016-05-19 DIAGNOSIS — F17210 Nicotine dependence, cigarettes, uncomplicated: Secondary | ICD-10-CM | POA: Insufficient documentation

## 2016-05-19 DIAGNOSIS — Z79899 Other long term (current) drug therapy: Secondary | ICD-10-CM | POA: Insufficient documentation

## 2016-05-19 MED ORDER — LORAZEPAM 1 MG PO TABS
1.0000 mg | ORAL_TABLET | Freq: Once | ORAL | Status: AC
  Administered 2016-05-20: 1 mg via ORAL
  Filled 2016-05-19: qty 1

## 2016-05-19 MED ORDER — CHLORDIAZEPOXIDE HCL 25 MG PO CAPS: ORAL_CAPSULE | ORAL | Status: DC

## 2016-05-19 NOTE — Discharge Instructions (Signed)

## 2016-05-19 NOTE — ED Provider Notes (Signed)
CSN: [id number]     Arrival date & time 05/19/16  2228 History   First MD Initiated Contact with Patient 05/19/16 2344     Chief Complaint  Patient presents with  . Alcohol Problem     HPI Patient reports a long-standing history of alcohol abuse.  He reports his last drink was earlier today to help with "the shakes".  He states he has talked with a alcohol abuse center who reportedly will accept him once he has gone through medical detox.  Denies nausea and vomiting.  He was recently discharged from behavioral health for similar reasons.  He has no homicidal or suicidal thoughts.   Past Medical History  Diagnosis Date  . Alcohol abuse   . Depression   . Anxiety   . Hypertension   . Sickle cell anemia [facility](HCC)    Past Surgical History  Procedure Laterality Date  . Tonsillectomy     Family History  Problem Relation Age of Onset  . Cirrhosis Mother    Social History  Substance Use Topics  . Smoking status: Current Every Day Smoker -- 0.50 packs/day    Types: Cigarettes  . Smokeless tobacco: Never Used  . Alcohol Use: 0.6 oz/week    1 Cans of beer per week     Comment: a case a day; pt told this writer 12 pk daily or a fifth of vodka    Review of Systems  All other systems reviewed and are negative.     Allergies  Fish allergy and Shellfish allergy  Home Medications   Prior to Admission medications   Medication Sig Start Date End Date Taking? Authorizing Provider  chlordiazePOXIDE (LIBRIUM) 25 MG capsule 50mg  PO TID x 1D, then 25-50mg  PO BID X 1D, then 25-50mg  PO QD X 1D 05/19/16   Azalia BilisKevin Bevan Disney, MD  FLUoxetine (PROZAC) 20 MG capsule Take 1 capsule (20 mg total) by mouth daily. 05/18/16   Thermon LeylandLaura A Davis, NP  gabapentin (NEURONTIN) 100 MG capsule Take 2 capsules (200 mg total) by mouth 2 (two) times daily. 05/18/16   Thermon LeylandLaura A Davis, NP  Multiple Vitamin (MULTIVITAMIN WITH MINERALS) TABS tablet Take 1 tablet by mouth daily. 05/18/16   Thermon LeylandLaura A Davis, NP  sertraline (ZOLOFT) 100  MG tablet Take 1 tablet (100 mg total) by mouth daily. 05/18/16   Thermon LeylandLaura A Davis, NP  traZODone (DESYREL) 100 MG tablet Take 1 tablet (100 mg total) by mouth at bedtime. 05/18/16   Thermon LeylandLaura A Davis, NP   BP 120/87 mmHg  Pulse 93  Temp(Src) 98.3 F (36.8 C) (Oral)  Resp 18  Ht 6\' 2"  (1.88 m)  Wt 165 lb (74.844 kg)  BMI 21.18 kg/m2  SpO2 99% Physical Exam  Constitutional: He is oriented to person, place, and time. He appears well-developed and well-nourished.  HENT:  Head: Normocephalic.  Eyes: EOM are normal.  Neck: Normal range of motion.  Pulmonary/Chest: Effort normal.  Abdominal: He exhibits no distension.  Musculoskeletal: Normal range of motion.  Neurological: He is alert and oriented to person, place, and time.  Psychiatric: He has a normal mood and affect.  Nursing note and vitals reviewed.   ED Course  Procedures (including critical care time) Labs Review Labs Reviewed  COMPREHENSIVE METABOLIC PANEL  ETHANOL  CBC  URINE RAPID DRUG SCREEN, HOSP PERFORMED    Imaging Review No results found. I have personally reviewed and evaluated these images and lab results as part of my medical decision-making.   EKG Interpretation None  MDM   Final diagnoses:  Alcohol abuse    Patient is not tremulous at this time.  He is not tachycardic or hypertensive.  I do not believe the patient is an alcohol withdrawal this time.  I believe he can successfully medically detox from alcohol at home on her Librium taper.  I prescribed him the Librium and explained to him the importance of the taper and compliance with medications.  I've encouraged him to continue outpatient assistance with ongoing alcohol abuse.    Azalia Bilis, MD 05/20/16 0001

## 2016-05-19 NOTE — ED Notes (Signed)
Pt here wanting detox from alcohol. He reports last drink was today. Has tx facility lined up he just needs detox. Pt calm and cooperative at triage.

## 2016-05-20 ENCOUNTER — Emergency Department (HOSPITAL_COMMUNITY)
Admission: EM | Admit: 2016-05-20 | Discharge: 2016-05-20 | Disposition: A | Discharge status: Home / Self Care | Payer: Self-pay | Attending: Emergency Medicine | Admitting: Emergency Medicine

## 2016-05-20 DIAGNOSIS — F1010 Alcohol abuse, uncomplicated: Secondary | ICD-10-CM

## 2016-05-20 LAB — COMPREHENSIVE METABOLIC PANEL
ALBUMIN: 3.9 g/dL (ref 3.5–5.0)
ALT: 22 U/L (ref 17–63)
AST: 28 U/L (ref 15–41)
Alkaline Phosphatase: 50 U/L (ref 38–126)
Anion gap: 9 (ref 5–15)
BILIRUBIN TOTAL: 0.5 mg/dL (ref 0.3–1.2)
BUN: 8 mg/dL (ref 6–20)
CHLORIDE: 105 mmol/L (ref 101–111)
CO2: 23 mmol/L (ref 22–32)
CREATININE: 0.84 mg/dL (ref 0.61–1.24)
Calcium: 8.9 mg/dL (ref 8.9–10.3)
GFR calc Af Amer: >60 mL/min (ref >60)
GFR calc non Af Amer: >60 mL/min (ref >60)
GLUCOSE: 83 mg/dL (ref 65–99)
POTASSIUM: 3.8 mmol/L (ref 3.5–5.1)
Sodium: 137 mmol/L (ref 135–145)
Total Protein: 6.9 g/dL (ref 6.5–8.1)

## 2016-05-20 LAB — RAPID URINE DRUG SCREEN, HOSP PERFORMED
Amphetamines: NOT DETECTED
BARBITURATES: NOT DETECTED
Benzodiazepines: NOT DETECTED
Cocaine: NOT DETECTED
Opiates: NOT DETECTED
TETRAHYDROCANNABINOL: NOT DETECTED

## 2016-05-20 LAB — CBC
HEMATOCRIT: 38.2 % — AB (ref 39.0–52.0)
HEMOGLOBIN: 12.8 g/dL — AB (ref 13.0–17.0)
MCH: 27.8 pg (ref 26.0–34.0)
MCHC: 33.5 g/dL (ref 30.0–36.0)
MCV: 83 fL (ref 78.0–100.0)
Platelets: 180 10*3/uL (ref 150–400)
RBC: 4.6 MIL/uL (ref 4.22–5.81)
RDW: 13.9 % (ref 11.5–15.5)
WBC: 8.8 10*3/uL (ref 4.0–10.5)

## 2016-05-20 LAB — ETHANOL: Alcohol, Ethyl (B): 164 mg/dL — ABNORMAL HIGH (ref <5)

## 2016-05-20 NOTE — ED Notes (Signed)
Pt dc'd in triage. Understands dc instructions.
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
## 2022-07-08 ENCOUNTER — Encounter (HOSPITAL_COMMUNITY): Payer: Self-pay | Admitting: Emergency Medicine

## 2022-07-08 ENCOUNTER — Other Ambulatory Visit: Payer: Self-pay

## 2022-07-08 ENCOUNTER — Emergency Department (HOSPITAL_COMMUNITY)
Admission: EM | Admit: 2022-07-08 | Discharge: 2022-07-08 | Disposition: A | Discharge status: Home / Self Care | Payer: Self-pay | Attending: Emergency Medicine | Admitting: Emergency Medicine

## 2022-07-08 DIAGNOSIS — F1010 Alcohol abuse, uncomplicated: Secondary | ICD-10-CM | POA: Insufficient documentation

## 2022-07-08 DIAGNOSIS — Y906 Blood alcohol level of 120-199 mg/100 ml: Secondary | ICD-10-CM | POA: Insufficient documentation

## 2022-07-08 DIAGNOSIS — I10 Essential (primary) hypertension: Secondary | ICD-10-CM | POA: Insufficient documentation

## 2022-07-08 LAB — COMPREHENSIVE METABOLIC PANEL
ALT: 109 U/L — ABNORMAL HIGH (ref 0–44)
AST: 141 U/L — ABNORMAL HIGH (ref 15–41)
Albumin: 4 g/dL (ref 3.5–5.0)
Alkaline Phosphatase: 57 U/L (ref 38–126)
Anion gap: 14 (ref 5–15)
BUN: 7 mg/dL (ref 6–20)
CO2: 22 mmol/L (ref 22–32)
Calcium: 9.3 mg/dL (ref 8.9–10.3)
Chloride: 102 mmol/L (ref 98–111)
Creatinine, Ser: 0.98 mg/dL (ref 0.61–1.24)
GFR, Estimated: >60 mL/min (ref >60)
Glucose, Bld: 69 mg/dL — ABNORMAL LOW (ref 70–99)
Potassium: 4.3 mmol/L (ref 3.5–5.1)
Sodium: 138 mmol/L (ref 135–145)
Total Bilirubin: 0.5 mg/dL (ref 0.3–1.2)
Total Protein: 7.5 g/dL (ref 6.5–8.1)

## 2022-07-08 LAB — CBC WITH DIFFERENTIAL/PLATELET
Abs Immature Granulocytes: 0.01 10*3/uL (ref 0.00–0.07)
Basophils Absolute: 0.1 10*3/uL (ref 0.0–0.1)
Basophils Relative: 2 %
Eosinophils Absolute: 0.1 10*3/uL (ref 0.0–0.5)
Eosinophils Relative: 2 %
HCT: 44 % (ref 39.0–52.0)
Hemoglobin: 15.1 g/dL (ref 13.0–17.0)
Immature Granulocytes: 0 %
Lymphocytes Relative: 35 %
Lymphs Abs: 1.7 10*3/uL (ref 0.7–4.0)
MCH: 28.3 pg (ref 26.0–34.0)
MCHC: 34.3 g/dL (ref 30.0–36.0)
MCV: 82.4 fL (ref 80.0–100.0)
Monocytes Absolute: 0.5 10*3/uL (ref 0.1–1.0)
Monocytes Relative: 10 %
Neutro Abs: 2.5 10*3/uL (ref 1.7–7.7)
Neutrophils Relative %: 51 %
Platelets: 142 10*3/uL — ABNORMAL LOW (ref 150–400)
RBC: 5.34 MIL/uL (ref 4.22–5.81)
RDW: 15.1 % (ref 11.5–15.5)
WBC: 4.8 10*3/uL (ref 4.0–10.5)
nRBC: 0 % (ref 0.0–0.2)

## 2022-07-08 LAB — ETHANOL: Alcohol, Ethyl (B): 133 mg/dL — ABNORMAL HIGH (ref <10)

## 2022-07-08 MED ORDER — CHLORDIAZEPOXIDE HCL 25 MG PO CAPS: ORAL_CAPSULE | ORAL | 0 refills | Status: DC

## 2022-07-08 NOTE — ED Triage Notes (Signed)
Patient here for detox wanting to stop drinking. Reports 2 beers consumed an hour ago but usually drinks a case (24 pack) a day. Patient denies SI/HI.  Denies any other medical complaints.

## 2022-07-08 NOTE — ED Provider Triage Note (Signed)
Emergency Medicine Provider Triage Evaluation Note  Chad Sanchez , a 51 y.o. male  was evaluated in triage.  Pt complains of alcohol detox. Usually drinks 24 beers, drank 2 1 hour pta. Requesting detox. No seizures, vomiting, diarrhea, abdominal pain, chest pain yet..  Review of Systems  Per hpi  Physical Exam  BP 107/85 (BP Location: Right Arm)   Pulse 67   Temp 98.8 F (37.1 C) (Oral)   Resp 18   SpO2 96%  Gen:   Awake, no distress   Resp:  Normal effort  MSK:   Moves extremities without difficulty  Other:  No asterixsis. Regular rhythm.   Medical Decision Making  Medically screening exam initiated at 1:25 PM.  Appropriate orders placed.  Chad Sanchez was informed that the remainder of the evaluation will be completed by another provider, this initial triage assessment does not replace that evaluation, and the importance of remaining in the ED until their evaluation is complete.  Clinically not in withdrawal. Offered librium daymark, patietn aware we don't do detox. Wants to be seen in back.    Theron Arista, PA-C 07/08/22 1326

## 2022-07-08 NOTE — Discharge Instructions (Signed)
Use resources provided to obtain alcohol treatment.  If you are not going to drink anymore you can take Librium taper as prescribed to help prevent withdrawal symptoms.  If you develop worsening symptoms such as chest pain, palpitations, hallucinations, seizure-like activity, persistent vomiting or other new or worsening symptoms please return for reevaluation.  You can also go to the behavioral health urgent care for resources and help with alcohol abuse treatment.

## 2022-07-08 NOTE — ED Provider Notes (Signed)
[facility] EMERGENCY DEPARTMENT Provider Note   CSN: [id number] Arrival date & time: 07/08/22  1242     History  Chief Complaint  Patient presents with   Alcohol Problem    Chad Sanchez is a 51 y.o. male.  Chad Sanchez is a 51 y.o. male with hx of HTN, alcohol abuse, anxiety and depression, who presents requesting alcohol detox. Pt reports he has drank heavily for the last 30 years, stopping intermittently. He reports he has drank about 1 case of beer per day for the last month. Drank a case of beer yesterday and drank late into the night. Pt drank two beers around 11 am, but has not had anything else to drink. Pt reports he has been having some tremors today, which he usually gets if he stops drinking. He has had seizures in the past due to alcohol withdrawal. No seizures today, no hallucinations. Denies any chest pain, palpitations, abdominal pain or vomiting. Pt denies any suicidal or homicidal thoughts.  The history is provided by the patient.  Alcohol Problem Pertinent negatives include no chest pain, no headaches and no shortness of breath.       Home Medications Prior to Admission medications   Medication Sig Start Date End Date Taking? Authorizing Provider  chlordiazePOXIDE (LIBRIUM) 25 MG capsule 50mg  PO TID x 1D, then 25-50mg  PO BID X 1D, then 25-50mg  PO QD X 1D 07/08/22  Yes Felicha Frayne N, PA-C  FLUoxetine (PROZAC) 20 MG capsule Take 1 capsule (20 mg total) by mouth daily. 05/18/16   05/20/16, NP  gabapentin (NEURONTIN) 100 MG capsule Take 2 capsules (200 mg total) by mouth 2 (two) times daily. 05/18/16   05/20/16, NP  Multiple Vitamin (MULTIVITAMIN WITH MINERALS) TABS tablet Take 1 tablet by mouth daily. 05/18/16   05/20/16, NP  sertraline (ZOLOFT) 100 MG tablet Take 1 tablet (100 mg total) by mouth daily. 05/18/16   05/20/16, NP  traZODone (DESYREL) 100 MG tablet Take 1 tablet (100 mg total) by mouth at bedtime.  05/18/16   05/20/16, NP      Allergies    Fish allergy and Shellfish allergy    Review of Systems   Review of Systems  Constitutional:  Negative for chills and fever.  Respiratory:  Negative for shortness of breath.   Cardiovascular:  Negative for chest pain and palpitations.  Gastrointestinal:  Negative for nausea.  Musculoskeletal:  Negative for myalgias.  Neurological:  Positive for tremors. Negative for dizziness, seizures, light-headedness and headaches.  Psychiatric/Behavioral:  Negative for suicidal ideas.     Physical Exam Updated Vital Signs BP (!) 127/90   Pulse 83   Temp 98 F (36.7 C) (Oral)   Resp 18   SpO2 93%  Physical Exam Vitals and nursing note reviewed.  Constitutional:      General: He is not in acute distress.    Appearance: Normal appearance. He is well-developed. He is not ill-appearing or diaphoretic.  HENT:     Head: Normocephalic and atraumatic.  Eyes:     General:        Right eye: No discharge.        Left eye: No discharge.  Cardiovascular:     Rate and Rhythm: Normal rate and regular rhythm.     Pulses: Normal pulses.     Heart sounds: Normal heart sounds.  Pulmonary:     Effort: Pulmonary effort is normal. No respiratory distress.  Breath sounds: Normal breath sounds. No wheezing or rales.     Comments: Respirations equal and unlabored, patient able to speak in full sentences, lungs clear to auscultation bilaterally  Abdominal:     General: Bowel sounds are normal. There is no distension.     Palpations: Abdomen is soft. There is no mass.     Tenderness: There is no abdominal tenderness. There is no guarding.     Comments: Abdomen soft, nondistended, nontender to palpation in all quadrants without guarding or peritoneal signs  Musculoskeletal:        General: No deformity.     Cervical back: Neck supple.  Skin:    General: Skin is warm and dry.     Capillary Refill: Capillary refill takes less than 2 seconds.  Neurological:      Mental Status: He is alert and oriented to person, place, and time.     Coordination: Coordination normal.     Comments: Speech is clear, able to follow commands CN III-XII intact Normal strength in upper and lower extremities bilaterally including dorsiflexion and plantar flexion, strong and equal grip strength Sensation normal to light and sharp touch Intermittent tremors of arms, able to stop tremors when trying to move to go something  Psychiatric:        Mood and Affect: Mood normal.        Behavior: Behavior normal.        Thought Content: Thought content does not include homicidal or suicidal ideation.     ED Results / Procedures / Treatments   Labs (all labs ordered are listed, but only abnormal results are displayed) Labs Reviewed  COMPREHENSIVE METABOLIC PANEL - Abnormal; Notable for the following components:      Result Value   Glucose, Bld 69 (*)    AST 141 (*)    ALT 109 (*)    All other components within normal limits  CBC WITH DIFFERENTIAL/PLATELET - Abnormal; Notable for the following components:   Platelets 142 (*)    All other components within normal limits  ETHANOL - Abnormal; Notable for the following components:   Alcohol, Ethyl (B) 133 (*)    All other components within normal limits  CBG MONITORING, ED    EKG None  Radiology No results found.  Procedures Procedures    Medications Ordered in ED Medications - No data to display  ED Course/ Medical Decision Making/ A&P                           Medical Decision Making Amount and/or Complexity of Data Reviewed Labs: ordered.  Risk Prescription drug management.   51 y.o. male presents to the ED requesting alcohol detox, this involves an extensive number of treatment options, and is a complaint that carries with it a high risk of complications and morbidity.  The differential diagnosis includes alcohol intoxication, withdrawal, Dts.  On arrival pt is nontoxic, vitals WNL.  Additional  history obtained from chart review. Previous records obtained and reviewed     Lab Tests:  I Ordered, reviewed, and interpreted labs, which included: no leukocytosis, glucose 69, but pt had not eaten, provided soda and sandwich, mild transaminitis in setting of EtOH use, ethanol level 133  ED Course:   Pt requesting alcohol detox, drank prior to arrival, experiencing some tremors, but seems to be somewhat volitional. CIWA on arrival of 3, and after several hours nor worsening in symptoms.No indication for medical admission for  alcohol withdrawal. Pt provided resources for outpatient treatment and given prescription for librium taper, cautioned pt to avoid using alcohol while taking this medication and discussed risks.  At this time there does not appear to be any evidence of an acute emergency medical condition requiring further emergent evaluation and the patient appears stable for discharge with appropriate outpatient follow up. Diagnosis and return precautions discussed with patient who verbalizes understanding and is agreeable to discharge.       Portions of this note were generated with Scientist, clinical (histocompatibility and immunogenetics). Dictation errors may occur despite best attempts at proofreading.         Final Clinical Impression(s) / ED Diagnoses Final diagnoses:  Alcohol abuse    Rx / DC Orders ED Discharge Orders          Ordered    chlordiazePOXIDE (LIBRIUM) 25 MG capsule        07/08/22 1902              Legrand Rams 07/08/22 2146    Glyn Ade, MD 07/08/22 2232

## 2022-12-23 ENCOUNTER — Emergency Department (HOSPITAL_COMMUNITY)
Admission: EM | Admit: 2022-12-23 | Discharge: 2022-12-23 | Disposition: A | Discharge status: Home / Self Care | Payer: 59 | Attending: Emergency Medicine | Admitting: Emergency Medicine

## 2022-12-23 ENCOUNTER — Emergency Department (EMERGENCY_DEPARTMENT_HOSPITAL)
Admission: EM | Admit: 2022-12-23 | Discharge: 2022-12-24 | Disposition: A | Discharge status: Other Acute Inpatient Hospital | Payer: 59 | Source: Home / Self Care | Attending: Emergency Medicine | Admitting: Emergency Medicine

## 2022-12-23 ENCOUNTER — Other Ambulatory Visit: Payer: Self-pay

## 2022-12-23 ENCOUNTER — Encounter (HOSPITAL_COMMUNITY): Payer: Self-pay

## 2022-12-23 ENCOUNTER — Encounter (HOSPITAL_COMMUNITY): Payer: Self-pay | Admitting: Emergency Medicine

## 2022-12-23 DIAGNOSIS — F17210 Nicotine dependence, cigarettes, uncomplicated: Secondary | ICD-10-CM | POA: Insufficient documentation

## 2022-12-23 DIAGNOSIS — F10229 Alcohol dependence with intoxication, unspecified: Secondary | ICD-10-CM | POA: Diagnosis not present

## 2022-12-23 DIAGNOSIS — I10 Essential (primary) hypertension: Secondary | ICD-10-CM | POA: Insufficient documentation

## 2022-12-23 DIAGNOSIS — Z1152 Encounter for screening for COVID-19: Secondary | ICD-10-CM | POA: Insufficient documentation

## 2022-12-23 DIAGNOSIS — F332 Major depressive disorder, recurrent severe without psychotic features: Secondary | ICD-10-CM | POA: Insufficient documentation

## 2022-12-23 DIAGNOSIS — F10239 Alcohol dependence with withdrawal, unspecified: Secondary | ICD-10-CM | POA: Insufficient documentation

## 2022-12-23 DIAGNOSIS — F10929 Alcohol use, unspecified with intoxication, unspecified: Secondary | ICD-10-CM | POA: Diagnosis present

## 2022-12-23 DIAGNOSIS — R45851 Suicidal ideations: Secondary | ICD-10-CM

## 2022-12-23 DIAGNOSIS — Y908 Blood alcohol level of 240 mg/100 ml or more: Secondary | ICD-10-CM | POA: Diagnosis not present

## 2022-12-23 DIAGNOSIS — F10930 Alcohol use, unspecified with withdrawal, uncomplicated: Secondary | ICD-10-CM

## 2022-12-23 DIAGNOSIS — F1020 Alcohol dependence, uncomplicated: Secondary | ICD-10-CM | POA: Diagnosis not present

## 2022-12-23 LAB — RAPID URINE DRUG SCREEN, HOSP PERFORMED
Amphetamines: NOT DETECTED
Amphetamines: NOT DETECTED
Barbiturates: NOT DETECTED
Barbiturates: NOT DETECTED
Benzodiazepines: NOT DETECTED
Benzodiazepines: NOT DETECTED
Cocaine: POSITIVE — AB
Cocaine: POSITIVE — AB
Opiates: NOT DETECTED
Opiates: NOT DETECTED
Tetrahydrocannabinol: NOT DETECTED
Tetrahydrocannabinol: NOT DETECTED

## 2022-12-23 LAB — COMPREHENSIVE METABOLIC PANEL
ALT: 27 U/L (ref 0–44)
AST: 40 U/L (ref 15–41)
Albumin: 4.4 g/dL (ref 3.5–5.0)
Alkaline Phosphatase: 75 U/L (ref 38–126)
Anion gap: 15 (ref 5–15)
BUN: 7 mg/dL (ref 6–20)
CO2: 23 mmol/L (ref 22–32)
Calcium: 9.2 mg/dL (ref 8.9–10.3)
Chloride: 100 mmol/L (ref 98–111)
Creatinine, Ser: 0.99 mg/dL (ref 0.61–1.24)
GFR, Estimated: >60 mL/min (ref >60)
Glucose, Bld: 95 mg/dL (ref 70–99)
Potassium: 3.8 mmol/L (ref 3.5–5.1)
Sodium: 138 mmol/L (ref 135–145)
Total Bilirubin: 0.5 mg/dL (ref 0.3–1.2)
Total Protein: 7.8 g/dL (ref 6.5–8.1)

## 2022-12-23 LAB — ETHANOL
Alcohol, Ethyl (B): 320 mg/dL (ref <10)
Alcohol, Ethyl (B): <10 mg/dL (ref <10)

## 2022-12-23 LAB — CBC
HCT: 46.3 % (ref 39.0–52.0)
Hemoglobin: 14.6 g/dL (ref 13.0–17.0)
MCH: 26 pg (ref 26.0–34.0)
MCHC: 31.5 g/dL (ref 30.0–36.0)
MCV: 82.4 fL (ref 80.0–100.0)
Platelets: 292 10*3/uL (ref 150–400)
RBC: 5.62 MIL/uL (ref 4.22–5.81)
RDW: 13.9 % (ref 11.5–15.5)
WBC: 6.4 10*3/uL (ref 4.0–10.5)
nRBC: 0 % (ref 0.0–0.2)

## 2022-12-23 LAB — SALICYLATE LEVEL: Salicylate Lvl: <7 mg/dL — ABNORMAL LOW (ref 7.0–30.0)

## 2022-12-23 LAB — ACETAMINOPHEN LEVEL: Acetaminophen (Tylenol), Serum: <10 ug/mL — ABNORMAL LOW (ref 10–30)

## 2022-12-23 MED ORDER — LORAZEPAM 1 MG PO TABS
1.0000 mg | ORAL_TABLET | Freq: Once | ORAL | Status: AC
  Administered 2022-12-23: 1 mg via ORAL
  Filled 2022-12-23: qty 1

## 2022-12-23 MED ORDER — LORAZEPAM 1 MG PO TABS: 0.0000 mg | ORAL_TABLET | Freq: Two times a day (BID) | ORAL | Status: DC

## 2022-12-23 MED ORDER — LORAZEPAM 1 MG PO TABS: 1.0000 mg | ORAL_TABLET | Freq: Two times a day (BID) | ORAL | Status: DC

## 2022-12-23 MED ORDER — THIAMINE HCL 100 MG/ML IJ SOLN: 100.0000 mg | Freq: Every day | INTRAMUSCULAR | Status: DC

## 2022-12-23 MED ORDER — LORAZEPAM 1 MG PO TABS
1.0000 mg | ORAL_TABLET | Freq: Four times a day (QID) | ORAL | Status: DC
  Administered 2022-12-23 – 2022-12-24 (×2): 1 mg via ORAL
  Filled 2022-12-23 (×2): qty 1

## 2022-12-23 MED ORDER — THIAMINE MONONITRATE 100 MG PO TABS
100.0000 mg | ORAL_TABLET | Freq: Every day | ORAL | Status: DC
  Administered 2022-12-23 – 2022-12-24 (×2): 100 mg via ORAL
  Filled 2022-12-23 (×2): qty 1

## 2022-12-23 MED ORDER — CHLORDIAZEPOXIDE HCL 25 MG PO CAPS: ORAL_CAPSULE | ORAL | 0 refills | Status: DC

## 2022-12-23 MED ORDER — LORAZEPAM 1 MG PO TABS
0.0000 mg | ORAL_TABLET | Freq: Four times a day (QID) | ORAL | Status: DC
  Filled 2022-12-23: qty 1

## 2022-12-23 NOTE — ED Notes (Signed)
Pt stopped answering questions including SI/HI questions.

## 2022-12-23 NOTE — Consult Note (Signed)
[place] ED ASSESSMENT   Reason for Consult:  Psychiatry evaluation Referring Physician:  ER Physician Patient Identification: Chad Sanchez MRN:  [id number] ED Chief Complaint: Alcohol use disorder, severe, dependence ([place])  Diagnosis:  Principal Problem:   Alcohol use disorder, severe, dependence ([place]) Active Problems:   MDD (major depressive disorder), recurrent severe, without psychosis [facility])   ED Assessment Time Calculation: Start Time: 1926 Stop Time: 1947 Total Time in Minutes (Assessment Completion): 21   Subjective:   Chad Sanchez is a 52 y.o. male patient admitted with previous hx of Alcoholism Depression and Cocaine use Disorder .came in this evening seeking detox treatment from Alcohol and feeling suicidal.  Patient was discharged from [facility] ER early this morning and he came back to [place].  HPI:  Patient was seen in the ER resting awake and alert.  Patient states he is depressed and angry because he cannot stop drinking Alcohol.  Patient reports feeling suicidal with plan to jump [place] the [place].  Patient rates depression 8/10 with 10 being severe depression.  Patient states his main stressor is using alcohol for sleep and to calm down.  He has not taken Psychotropic medications in Seven years.  He has no outpatient Psychiatric provider.  Patient has hx of Multiple ER visits to [place], [place] and [place] area.  Patient  reports he he did a two years rehab for Alcohol [place] in [place].  After graduation and stayed sober for a year and relapsed.  Patient denies hx of suicide attempt but is not happy with his Alcoholism.  Patient denies HI/AVH and no mention of paranoia. Patient is on CIWA protocol with Ativan coverage.  Patient  is suicidal and have a plan to jump off the Bridge.  We will seek inpatient Psychiatric hospitalization for treatment of depression and Detox treatment.  We will fax out records to facilities that have available bed.  Alcohol level  on arrival to the ER 320, UDS is positive for Cocaine   Past Psychiatric History:  previous hx of Alcoholism Depression and Cocaine use Disorder.  Previous two years [place]  He is not on Psychotropic and does have a Psychiatrist.  Risk to Self or Others: Is the patient at risk to self? Yes Has the patient been a risk to self in the past 6 months? Yes Has the patient been a risk to self within the distant past? Yes Is the patient a risk to others? No Has the patient been a risk to others in the past 6 months? No Has the patient been a risk to others within the distant past? No  [place] Scale:  [place] ED from 12/23/2022 in [facility] Emergency Department at [facility] ED from 07/08/2022 in [facility] Emergency Department at [place] CATEGORY High Risk No Risk       AIMS:  , , ,  ,   ASAM:    Substance Abuse:     Past Medical History:  Past Medical History:  Diagnosis Date   Alcohol abuse    Anxiety    Depression    Hypertension    Sickle cell anemia ([place])     Past Surgical History:  Procedure Laterality Date   TONSILLECTOMY     Family History:  Family History  Problem Relation Age of Onset   Cirrhosis Mother    Family Psychiatric  History: Unknown, adopted at a very young age. Social History:  Social History  Substance and Sexual Activity  Alcohol Use Yes   Alcohol/week: 1.0 standard drink of alcohol   Types: 1 Cans of beer per week   Comment: a case a day; pt told this writer 12 pk daily or a fifth of vodka     Social History   Substance and Sexual Activity  Drug Use Yes   Types: "Crack" cocaine, Cocaine   Comment: last used 2 weeks ago    Social History   Socioeconomic History   Marital status: Single    Spouse name: Not on file   Number of children: Not on file   Years of education: Not on file   Highest education level: Not on file  Occupational History   Not on file  Tobacco Use   Smoking  status: Every Day    Packs/day: 0.50    Types: Cigarettes   Smokeless tobacco: Never  Substance and Sexual Activity   Alcohol use: Yes    Alcohol/week: 1.0 standard drink of alcohol    Types: 1 Cans of beer per week    Comment: a case a day; pt told this writer 12 pk daily or a fifth of vodka   Drug use: Yes    Types: "Crack" cocaine, Cocaine    Comment: last used 2 weeks ago   Sexual activity: Not Currently  Other Topics Concern   Not on file  Social History Narrative   Not on file   Social Determinants of Health   Financial Resource Strain: Not on file  Food Insecurity: Not on file  Transportation Needs: Not on file  Physical Activity: Not on file  Stress: Not on file  Social Connections: Not on file   Additional Social History:    Allergies:   Allergies  Allergen Reactions   Fish Allergy Anaphylaxis   Shellfish Allergy Anaphylaxis    Labs:  Results for orders placed or performed during the hospital encounter of 12/23/22 (from the past 48 hour(s))  Rapid urine drug screen (hospital performed)     Status: Abnormal   Collection Time: 12/23/22  6:51 PM  Result Value Ref Range   Opiates NONE DETECTED NONE DETECTED   Cocaine POSITIVE (A) NONE DETECTED   Benzodiazepines NONE DETECTED NONE DETECTED   Amphetamines NONE DETECTED NONE DETECTED   Tetrahydrocannabinol NONE DETECTED NONE DETECTED   Barbiturates NONE DETECTED NONE DETECTED    Comment: (NOTE) DRUG SCREEN FOR MEDICAL PURPOSES ONLY.  IF CONFIRMATION IS NEEDED FOR ANY PURPOSE, NOTIFY LAB WITHIN 5 DAYS.  LOWEST DETECTABLE LIMITS FOR URINE DRUG SCREEN Drug Class                     Cutoff (ng/mL) Amphetamine and metabolites    1000 Barbiturate and metabolites    200 Benzodiazepine                 200 Opiates and metabolites        300 Cocaine and metabolites        300 THC                            50 Performed at [facility], [place] [address], [place], [place] [address]   Ethanol      Status: None   Collection Time: 12/23/22  7:00 PM  Result Value Ref Range   Alcohol, Ethyl (B) <10 <10 mg/dL    Comment: (NOTE) Lowest detectable limit for serum alcohol is  10 mg/dL.  For medical purposes only. Performed at [facility], [place] [address] [place], [place] [id number]   Salicylate level     Status: Abnormal   Collection Time: 12/23/22  7:00 PM  Result Value Ref Range   Salicylate Lvl [id number] (L) 7.0 - 30.0 mg/dL    Comment: Performed at [facility], [place] [address], [place], [place] [address]  Acetaminophen level     Status: Abnormal   Collection Time: 12/23/22  7:00 PM  Result Value Ref Range   Acetaminophen (Tylenol), Serum <10 (L) 10 - 30 ug/mL    Comment: (NOTE) Therapeutic concentrations vary significantly. A range of 10-30 ug/mL  may be an effective concentration for many patients. However, some  are best treated at concentrations outside of this range. Acetaminophen concentrations >150 ug/mL at 4 hours after ingestion  and >50 ug/mL at 12 hours after ingestion are often associated with  toxic reactions.  Performed at [facility], [place] [address] [place], [place] [address]     Current Facility-Administered Medications  Medication Dose Route Frequency Provider Last Rate Last Admin   LORazepam (ATIVAN) tablet 1 mg  1 mg Oral Q6H Redwine, Madison A, PA-C   1 mg at 12/23/22 1845   Or   LORazepam (ATIVAN) tablet 0-4 mg  0-4 mg Oral Q6H Redwine, Madison A, PA-C       [START ON 12/26/2022] LORazepam (ATIVAN) tablet 1 mg  1 mg Oral Q12H Redwine, Madison A, PA-C       Or   [START ON 12/26/2022] LORazepam (ATIVAN) tablet 0-4 mg  0-4 mg Oral Q12H Redwine, Madison A, PA-C       thiamine (VITAMIN B1) tablet 100 mg  100 mg Oral Daily Redwine, Madison A, PA-C   100 mg at 12/23/22 1845   Or   thiamine (VITAMIN B1) injection 100 mg  100 mg Intravenous Daily Redwine, Madison A, PA-C       Current Outpatient Medications   Medication Sig Dispense Refill   chlordiazePOXIDE (LIBRIUM) 25 MG capsule 54m PO TID x 1D, then 25-530mPO BID X 1D, then 25-5039mO QD X 1D 10 capsule 0   FLUoxetine (PROZAC) 20 MG capsule Take 1 capsule (20 mg total) by mouth daily. (Patient not taking: Reported on 12/23/2022) 14 capsule 0   gabapentin (NEURONTIN) 100 MG capsule Take 2 capsules (200 mg total) by mouth 2 (two) times daily. (Patient not taking: Reported on 12/23/2022) 56 capsule 0   Multiple Vitamin (MULTIVITAMIN WITH MINERALS) TABS tablet Take 1 tablet by mouth daily. (Patient not taking: Reported on 12/23/2022)     sertraline (ZOLOFT) 100 MG tablet Take 1 tablet (100 mg total) by mouth daily. (Patient not taking: Reported on 12/23/2022) 30 tablet 0   traZODone (DESYREL) 100 MG tablet Take 1 tablet (100 mg total) by mouth at bedtime. (Patient not taking: Reported on 12/23/2022) 30 tablet 0    Musculoskeletal: Strength & Muscle Tone: within normal limits Gait & Station: normal Patient leans: Front   Psychiatric Specialty Exam: Presentation  General Appearance:  Disheveled  Eye Contact: Good  Speech: Clear and Coherent; Normal Rate  Speech Volume: Normal  Handedness: Right   Mood and Affect  Mood: Depressed  Affect: Congruent   Thought Process  Thought Processes: Coherent; Goal Directed; Linear  Descriptions of Associations:Intact  Orientation:Full (Time, Place and Person)  Thought Content:Logical  History of Schizophrenia/Schizoaffective disorder:No data recorded Duration of Psychotic Symptoms:No data recorded Hallucinations:Hallucinations: None  Ideas of Reference:None  Suicidal Thoughts:Suicidal Thoughts: Yes, Active SI Active Intent and/or Plan: With Intent; With Plan; With Means to Carry Out; With Access to Means (Plan to jump off bridge)  Homicidal Thoughts:No data recorded  Sensorium  Memory: Immediate Good; Recent Good; Remote  Good  Judgment: Intact  Insight: Present   Executive Functions  Concentration: Good  Attention Span: Good  Recall: Good  Fund of Knowledge: Good  Language: Good   Psychomotor Activity  Psychomotor Activity: Psychomotor Activity: Normal   Assets  Assets: Communication Skills    Sleep  Sleep: Sleep: Poor   Physical Exam: Physical Exam Vitals and nursing note reviewed.  Constitutional:      Appearance: Normal appearance.  HENT:     Head: Normocephalic and atraumatic.     Nose: Nose normal.  Cardiovascular:     Rate and Rhythm: Normal rate and regular rhythm.  Pulmonary:     Effort: Pulmonary effort is normal.  Musculoskeletal:        General: Normal range of motion.     Cervical back: Normal range of motion.  Skin:    General: Skin is warm and dry.  Neurological:     General: No focal deficit present.     Mental Status: He is alert and oriented to person, place, and time.    Review of Systems  Constitutional: Negative.   HENT: Negative.    Eyes: Negative.   Respiratory: Negative.    Cardiovascular: Negative.   Gastrointestinal: Negative.   Genitourinary: Negative.   Musculoskeletal: Negative.   Skin: Negative.   Neurological: Negative.   Endo/Heme/Allergies: Negative.   Psychiatric/Behavioral:  Positive for depression, substance abuse and suicidal ideas.    Blood pressure 125/88, pulse 97, temperature 99.4 F (37.4 C), temperature source Oral, resp. rate 18, SpO2 100 %. There is no height or weight on file to calculate BMI.  Medical Decision Making: Patient  is suicidal and have a plan to jump off the Bridge.  We will seek inpatient Psychiatric hospitalization for treatment of depression and Detox treatment.  We will fax out records to facilities that have available bed.  Patient is on CIWA protocol with Ativan coverage.  Problem 1: Alcohol use disorder, severe Dependence   Problem 2: Recurrent Major Depressive Disorder, recurrent  Severe  without Psychotic features.  Disposition:  Admit, seek bed placement.  Delfin Gant, NP-PMHNP-BC 12/23/2022 8:18 PM

## 2022-12-23 NOTE — ED Notes (Signed)
Patient has urine in the may lab.

## 2022-12-23 NOTE — ED Provider Notes (Signed)
[facility] Provider Note   CSN: [id number] Arrival date & time: 12/23/22  1752     History  Chief Complaint  Patient presents with   Suicidal    Chad Sanchez is a 52 y.o. male with alcohol dependence, MDD, GAD who presents with SI and EtOH detox.  Patient was evaluated and seen in the ED this morning for EtOH intoxication and DC'd with librium taper. Patient states he went home and continued shaking, felt as though he was going to continue drinking alcohol but didn't want to so came back to ED for inpatient treatment. Also notes that he feels suicidal, very hopeless, and states he wants to hurt or kill himself because his addiction makes him feel so worthless/hopeless like it's never going to get better. Denies AH/VH/HI. No plan. Endorses h/o withdrawal seizures but none recently. Last drink was this AM at 0100 AM. Drinks at least a case of beer and 1 pint of liquor per day x 4  -5 months. Also endorses crack cocaine use, smoking, last use also around 0100. Denies CP, SOB, abd pain, N/V/D/C, leg swelling, urinary symptoms, f/c, recent illnesses. Endorses shaking, anxiety, headache.    HPI     Home Medications Prior to Admission medications   Medication Sig Start Date End Date Taking? Authorizing Provider  chlordiazePOXIDE (LIBRIUM) 25 MG capsule 19m PO TID x 1D, then 25-585mPO BID X 1D, then 25-5041mO QD X 1D 12/23/22   Blue, Soijett A, PA-C  FLUoxetine (PROZAC) 20 MG capsule Take 1 capsule (20 mg total) by mouth daily. Patient not taking: Reported on 12/23/2022 05/18/16   DavNiel HummerP  gabapentin (NEURONTIN) 100 MG capsule Take 2 capsules (200 mg total) by mouth 2 (two) times daily. Patient not taking: Reported on 12/23/2022 05/18/16   DavNiel HummerP  Multiple Vitamin (MULTIVITAMIN WITH MINERALS) TABS tablet Take 1 tablet by mouth daily. Patient not taking: Reported on 12/23/2022 05/18/16   DavNiel HummerP  sertraline  (ZOLOFT) 100 MG tablet Take 1 tablet (100 mg total) by mouth daily. Patient not taking: Reported on 12/23/2022 05/18/16   DavNiel HummerP  traZODone (DESYREL) 100 MG tablet Take 1 tablet (100 mg total) by mouth at bedtime. Patient not taking: Reported on 12/23/2022 05/18/16   DavNiel HummerP      Allergies    Fish allergy and Shellfish allergy    Review of Systems   Review of Systems Review of systems Negative for falls.  A 10 point review of systems was performed and is negative unless otherwise reported in HPI.  Physical Exam Updated Vital Signs BP 125/88 (BP Location: Right Arm)   Pulse 97   Temp 99.4 F (37.4 C) (Oral)   Resp 18   SpO2 100%  Physical Exam General: Normal appearing male, lying in bed.  HEENT: PERRLA, EOMI, no nystagmus, Sclera anicteric, MMM, trachea midline. Mild to moderate tongue fasciculations.  Cardiology: RRR, no murmurs/rubs/gallops. BL radial and DP pulses equal bilaterally.  Resp: Normal respiratory rate and effort. CTAB, no wheezes, rhonchi, crackles.  Abd: Soft, non-tender, non-distended. No rebound tenderness or guarding.  GU: Deferred. MSK: Tremors with arms extended. No peripheral edema or signs of trauma. Extremities without deformity or TTP. No cyanosis or clubbing. Skin: warm, dry. No rashes or lesions. Back: No CVA tenderness Neuro: A&Ox4, CNs II-XII grossly intact. MAEs. Sensation grossly intact.  Psych: Normal mood and affect. Goal directed/oriented, calm and  polite.  ED Results / Procedures / Treatments   Labs (all labs ordered are listed, but only abnormal results are displayed) Labs Reviewed  SALICYLATE LEVEL - Abnormal; Notable for the following components:      Result Value   Salicylate Lvl Q000111Q (*)    All other components within normal limits  ACETAMINOPHEN LEVEL - Abnormal; Notable for the following components:   Acetaminophen (Tylenol), Serum <10 (*)    All other components within normal limits  RAPID URINE DRUG SCREEN,  HOSP PERFORMED - Abnormal; Notable for the following components:   Cocaine POSITIVE (*)    All other components within normal limits  ETHANOL    EKG EKG Interpretation  Date/Time:  Monday December 23 2022 19:24:37 EST Ventricular Rate:  93 PR Interval:  138 QRS Duration: 86 QT Interval:  368 QTC Calculation: 457 R Axis:   49 Text Interpretation: Normal sinus rhythm Similiar to prior Confirmed by Cindee Lame [phone]) on 12/23/2022 11:53:15 PM  Radiology No results found.  Procedures Procedures    Medications Ordered in ED Medications  LORazepam (ATIVAN) tablet 1 mg (1 mg Oral Given 12/23/22 1845)    Or  LORazepam (ATIVAN) tablet 0-4 mg ( Oral See Alternative 12/23/22 1845)  LORazepam (ATIVAN) tablet 1 mg (has no administration in time range)    Or  LORazepam (ATIVAN) tablet 0-4 mg (has no administration in time range)  thiamine (VITAMIN B1) tablet 100 mg (100 mg Oral Given 12/23/22 1845)    Or  thiamine (VITAMIN B1) injection 100 mg ( Intravenous See Alternative 12/23/22 1845)  LORazepam (ATIVAN) tablet 1 mg (1 mg Oral Given 12/23/22 1955)    ED Course/ Medical Decision Making/ A&P                          Medical Decision Making Amount and/or Complexity of Data Reviewed Labs: ordered. Decision-making details documented in ED Course.  Risk Prescription drug management.    This patient presents to the ED for concern of EtOH withdrawal, this involves an extensive number of treatment options, and is a complaint that carries with it a high risk of complications and morbidity.  I considered the following differential and admission for this acute, potentially life threatening condition.   MDM:    Per chart review patient was seen earlier this morning for alcohol intoxication and was discharged with Librium taper but states that he would have continued to drink if he had gone home and wants to get treatment.  He was afraid of the withdrawal that he has already begun to  experience and so presented again.  On my exam he has mild to moderate tongue fasciculations and tremors with arms extended. CIWA 14 at this time, will treat with PO ativan and thiamine ordered in triage. He had full set of normal labs earlier including CMP/CBC/UA, UDS was + for cocaine, but will also obtain acetaminophen/salicylate and recheck UDS/EtOH as earlier it was 320 but patient is now in withdrawal. Consider patient's SI which seems passive in s/o EtOH use with no plan but will d/w psychiatry as well. No c/f alcoholic hallucinosis or DTs. EKG with no QRS or QTc prolongation. Consider concurrent ingestions though patient does have normal mental status. No pain anywhere, no c/f ACS in s/o cocaine use. Patient is voluntary at this time and states he would like to be placed inpatient for SI and EtOH treatment. Consulted to TTS.   Clinical Course as of 12/23/22 2353  Mon Dec 23, 2022  1939 COCAINE(!): POSITIVE [HN]  [id number] Salicylate Lvl(!): Q000111Q [HN]  1939 Acetaminophen (Tylenol), S(!): <10 [HN]  1939 Alcohol, Ethyl (B): <10 [HN]  2002 Pulse Rate: 97 [HN]  2002 BP: 125/88 Patient's vitals are stable after initial dose of ativan. Patient is medically cleared for evaluation by TTS and can be managed on CIWA algorithm with librium taper upon discharge or transfer. [HN]  2203 Patient much improved after 65m PO ativan. Patient can be managed on CIWA. [HN]    Clinical Course User Index [HN] NAudley Hose MD    Labs: I Ordered, and personally interpreted labs.  The pertinent results include:  those listed above  Additional history obtained from chart review  Reevaluation: After the interventions noted above, I reevaluated the patient and found that they have :improved  Social Determinants of Health: Patient lives independently   Disposition:  Pending TTS  Co morbidities that complicate the patient evaluation  Past Medical History:  Diagnosis Date   Alcohol abuse    Anxiety     Depression    Hypertension    [place]      Medicines Meds ordered this encounter  Medications   OR Linked Order Group    LORazepam (ATIVAN) tablet 0-4 mg     Order Specific Question:   CIWA-AR < 5 =     Answer:   0 mg     Order Specific Question:   CIWA-AR 5 -10 =     Answer:   1 mg     Order Specific Question:   CIWA-AR 11 -15 =     Answer:   2 mg     Order Specific Question:   CIWA-AR 16 -20 =     Answer:   3 mg     Order Specific Question:   CIWA-AR 16 -20 =     Answer:   Recheck CIWA-AR in 1 hour; if > 20 notify MD     Order Specific Question:   CIWA-AR > 20 =     Answer:   4 mg     Order Specific Question:   CIWA-AR > 20 =     Answer:   Notify MD    LORazepam (ATIVAN) tablet 1 mg   OR Linked Order Group    LORazepam (ATIVAN) tablet 0-4 mg     Order Specific Question:   CIWA-AR < 5 =     Answer:   0 mg     Order Specific Question:   CIWA-AR 5 -10 =     Answer:   1 mg     Order Specific Question:   CIWA-AR 11 -15 =     Answer:   2 mg     Order Specific Question:   CIWA-AR 16 -20 =     Answer:   3 mg     Order Specific Question:   CIWA-AR 16 -20 =     Answer:   Recheck CIWA-AR in 1 hour; if > 20 notify MD     Order Specific Question:   CIWA-AR > 20 =     Answer:   4 mg     Order Specific Question:   CIWA-AR > 20 =     Answer:   Notify MD    LORazepam (ATIVAN) tablet 1 mg   OR Linked Order Group    thiamine (VITAMIN B1) tablet 100 mg    thiamine (VITAMIN B1) injection 100 mg  LORazepam (ATIVAN) tablet 1 mg    I have reviewed the patients home medicines and have made adjustments as needed  Problem List / ED Course: Problem List Items Addressed This Visit   None Visit Diagnoses     Suicidal ideation    -  Primary   Alcohol withdrawal syndrome without complication ([place])                       This note was created using dictation software, which may contain spelling or grammatical errors.    Audley Hose, MD 12/23/22  [phone]

## 2022-12-23 NOTE — ED Triage Notes (Signed)
Pt indicated that he needs detox from alcohol.  Reports he has been drinking for three months over a case of beer a day.  Last drink was "an hour ago."  Reports he gets the shakes and hx of a seizure when he stops drinking.  "I don't feel like talking anymore."

## 2022-12-23 NOTE — ED Notes (Signed)
Date and time results received: 12/23/22 0513 (use smartphrase ".now" to insert current time)  Test: etoh Critical Value: 320  Name of Provider Notified: D. Christy Gentles, MD

## 2022-12-23 NOTE — ED Triage Notes (Signed)
Patient said he has been feeling suicidal today. His plan is to jump off a bridge. Also said he wants alcohol detox and cocaine detox. Last drink was this morning. Drinks a case of beer and pint of liquor every day. Has had a withdrawal seizure before.

## 2022-12-23 NOTE — ED Notes (Signed)
All three pt belonging bags transfer to locker 37

## 2022-12-23 NOTE — ED Notes (Signed)
Urine save in main lab. Lab will run for drug screen

## 2022-12-23 NOTE — ED Notes (Signed)
Patient has been wanded and changed out into burgundy scrubs.  Patient has 3 personal belonging bags in cabinet 16 - 18

## 2022-12-23 NOTE — ED Notes (Incomplete)
Patient has been change out to burgundy scrubs.  Patient has 3 person belonging bags in 16-18 labeled.

## 2022-12-23 NOTE — Discharge Instructions (Addendum)
Take the Librium as prescribed.  If you drink alcohol while taking this medication it could lead to you not feeling well and following and you could hit your head.  It is important that you do not drink alcohol while you are taking this medication.  Attached are resources for alcohol use.  You may follow-up as needed.  Attached is also information for [facility] behavioral health urgent care to follow-up as needed for drinking.  Return to the emergency department if you experience increasing/worsening symptoms.

## 2022-12-23 NOTE — ED Provider Notes (Signed)
Care transferred from [facility], PA-C at time of sign out. See their note for full assessment.   Briefly: Patient is 52 y.o. male who presents to the ED with concerns for EtOH intoxication.     Plan: Plan per previous PA-C: metabolize and discharge. Prescription for librium is patient agreeable  Labs Reviewed  ETHANOL - Abnormal; Notable for the following components:      Result Value   Alcohol, Ethyl (B) 320 (*)    All other components within normal limits  COMPREHENSIVE METABOLIC PANEL  CBC  RAPID URINE DRUG SCREEN, HOSP PERFORMED    Clinical Course as of 12/23/22 1535  Mon Dec 23, 2022  0525 ETOH 320. Patient hemodynamically stable, will need to MTF.  [RS]  0730 Patient asleep resting comfortably on the stretcher on evaluation. [SB]  0901 Pt asleep on stretcher [SB]  1216 Pt resting, asleep on stretcher. Vitals stable.  [SB]  [id number] Patient reevaluated.  Patient resting comfortably on stretcher.  Patient able to ambulate to restroom without assistance.  No tremor or tongue fasciculations noted on exam. [SB]  1532 Patient reevaluated and eating and drinking comfortably on the stretcher.  Discussed with patient discharge treatment plan.  Answered all verbal questions.  Patient appears safe for discharge at this time. [SB]    Clinical Course User Index [RS] Sponseller, Gypsy Balsam, PA-C [SB] Wyeth Hoffer A, PA-C     Patient able to eat, drink, ambulate in the emergency department without assistance or difficulty.  Discussed with patient discharge treatment plan.  At this time patient notes that he would like to stop drinking alcohol.  Notes he drinks approximately a case of beers daily.  Discussed with patient that we can start him on Librium to aid with this and provide resources.  Patient agreeable to Librium prescription at this time.  In-depth conversation held with patient regarding use of Librium and to not consume alcohol while drinking this medication as it could cause him  to have an injury.  Patient understanding and agreeable at this time. Supportive care and strict return precautions discussed with patient. Patient acknowledges and verbalizes understanding. Pt appears safe for discharge. Follow up as indicated in discharge paperwork.    This chart was dictated using voice recognition software, Dragon. Despite the best efforts of this provider to proofread and correct errors, errors may still occur which can change documentation meaning.   Rakeya Glab A, PA-C 12/23/22 1535    Pattricia Boss, MD 12/23/22 1539

## 2022-12-23 NOTE — ED Notes (Signed)
Pt to be given an additional 38m of Ativan for Drs NMayra NeerCIWA of 14. Previous nurses score of 8, nurse administered ativan for her ciwa score. A one time dose of ativan entered by MDand given to pt

## 2022-12-23 NOTE — ED Provider Notes (Signed)
[facility] Provider Note   CSN: [id number] Arrival date & time: 12/23/22  [id number]     History  Chief Complaint  Patient presents with   Alcohol Problem    Chad Sanchez is a 52 y.o. male who presents with concern for ETOH abuse and requesting withdrawal. Patient sleeping soundly at time of my arrival to the bedside. Slurring words, speaking nonsensically, mumbling. Endorses "drinking and drinking and drinking and drinking" today. States he knew he would keep drinking if he stayed home. Unable to contribute further to history secondary to intoxication.   I have reviewed his medical records. He has hx of ETOH abuse, MDD, GAD, Sickle cell anemia.   HPI     Home Medications Prior to Admission medications   Medication Sig Start Date End Date Taking? Authorizing Provider  chlordiazePOXIDE (LIBRIUM) 25 MG capsule 62m PO TID x 1D, then 25-577mPO BID X 1D, then 25-5063mO QD X 1D 07/08/22   ForJacqlyn LarsenA-C  FLUoxetine (PROZAC) 20 MG capsule Take 1 capsule (20 mg total) by mouth daily. 05/18/16   DavNiel HummerP  gabapentin (NEURONTIN) 100 MG capsule Take 2 capsules (200 mg total) by mouth 2 (two) times daily. 05/18/16   DavNiel HummerP  Multiple Vitamin (MULTIVITAMIN WITH MINERALS) TABS tablet Take 1 tablet by mouth daily. 05/18/16   DavNiel HummerP  sertraline (ZOLOFT) 100 MG tablet Take 1 tablet (100 mg total) by mouth daily. 05/18/16   DavNiel HummerP  traZODone (DESYREL) 100 MG tablet Take 1 tablet (100 mg total) by mouth at bedtime. 05/18/16   DavNiel HummerP      Allergies    Fish allergy and Shellfish allergy    Review of Systems   Review of Systems  Unable to perform ROS: Other (intoxication)    Physical Exam Updated Vital Signs BP (!) 141/94 (BP Location: Right Arm)   Pulse 96   Temp (!) 97.3 F (36.3 C)   Resp 16   Ht 6' 2"$  (1.88 m)   Wt 74.8 kg   SpO2 97%   BMI 21.17 kg/m  Physical Exam Vitals  and nursing note reviewed.  Constitutional:      Appearance: He is not ill-appearing or toxic-appearing.  HENT:     Head: Normocephalic and atraumatic.     Mouth/Throat:     Mouth: Mucous membranes are moist.     Pharynx: No oropharyngeal exudate or posterior oropharyngeal erythema.  Eyes:     General:        Right eye: No discharge.        Left eye: No discharge.     Extraocular Movements: Extraocular movements intact.     Conjunctiva/sclera: Conjunctivae normal.     Pupils: Pupils are equal, round, and reactive to light.  Cardiovascular:     Rate and Rhythm: Normal rate and regular rhythm.     Pulses: Normal pulses.     Heart sounds: Normal heart sounds.  Pulmonary:     Effort: Pulmonary effort is normal. No respiratory distress.     Breath sounds: Normal breath sounds. No wheezing or rales.  Abdominal:     General: Bowel sounds are normal. There is no distension.     Palpations: Abdomen is soft.     Tenderness: There is no abdominal tenderness.  Musculoskeletal:        General: No deformity.     Cervical back: Neck supple.  Right lower leg: No edema.     Left lower leg: No edema.  Skin:    General: Skin is warm and dry.     Capillary Refill: Capillary refill takes less than 2 seconds.  Neurological:     General: No focal deficit present.     Comments: Appears acutely intoxicated.   Psychiatric:        Mood and Affect: Mood normal.     ED Results / Procedures / Treatments   Labs (all labs ordered are listed, but only abnormal results are displayed) Labs Reviewed  ETHANOL - Abnormal; Notable for the following components:      Result Value   Alcohol, Ethyl (B) 320 (*)    All other components within normal limits  COMPREHENSIVE METABOLIC PANEL  CBC  RAPID URINE DRUG SCREEN, HOSP PERFORMED    EKG None  Radiology No results found.  Procedures Procedures   Medications Ordered in ED Medications - No data to display  ED Course/ Medical Decision Making/  A&P Clinical Course as of 12/23/22 0612  Mon Dec 23, 2022  0525 ETOH 320. Patient hemodynamically stable, will need to MTF.  [RS]    Clinical Course User Index [RS] Mikalyn Hermida, Gypsy Balsam, PA-C                             Medical Decision Making 52 year old male who presents with ETOH intoxication.   HTN on intake, VS otherwise normal. Physical exam reassuring from a cardiopulmonary and abdominal stand poitn. Patient significantly intoxicated. Unable to contribute to his own history.   Amount and/or Complexity of Data Reviewed Labs: ordered.    Details: CBC without leukocytosis or anemia, CMP unremarkable, alcohol level to 320.     Care of this patient signed out to oncoming ED provider S. Blue, PA-C at time of shift change.  Patient will require metabolization to freedom, disposition ultimately pending reevaluation by oncoming ED team and further discussion with the patient regarding any acute symptoms he may be able to report once he is less intoxicated.  This chart was dictated using voice recognition software, Dragon. Despite the best efforts of this provider to proofread and correct errors, errors may still occur which can change documentation meaning.   Final Clinical Impression(s) / ED Diagnoses Final diagnoses:  None    Rx / DC Orders ED Discharge Orders     None         Aura Dials 12/23/22 0612    Ripley Fraise, MD 12/23/22 [phone]

## 2022-12-24 ENCOUNTER — Inpatient Hospital Stay (HOSPITAL_COMMUNITY)
Admission: AD | Admit: 2022-12-24 | Discharge: 2022-12-27 | DRG: 885 | Disposition: A | Discharge status: Home / Self Care | Payer: 59 | Source: Other Acute Inpatient Hospital | Attending: Psychiatry | Admitting: Psychiatry

## 2022-12-24 ENCOUNTER — Encounter (HOSPITAL_COMMUNITY): Payer: Self-pay | Admitting: Psychiatry

## 2022-12-24 DIAGNOSIS — R45851 Suicidal ideations: Secondary | ICD-10-CM | POA: Diagnosis present

## 2022-12-24 DIAGNOSIS — F411 Generalized anxiety disorder: Secondary | ICD-10-CM | POA: Diagnosis present

## 2022-12-24 DIAGNOSIS — F332 Major depressive disorder, recurrent severe without psychotic features: Secondary | ICD-10-CM | POA: Diagnosis present

## 2022-12-24 DIAGNOSIS — F10229 Alcohol dependence with intoxication, unspecified: Secondary | ICD-10-CM | POA: Diagnosis not present

## 2022-12-24 DIAGNOSIS — D571 Sickle-cell disease without crisis: Secondary | ICD-10-CM | POA: Diagnosis present

## 2022-12-24 DIAGNOSIS — Y908 Blood alcohol level of 240 mg/100 ml or more: Secondary | ICD-10-CM | POA: Diagnosis present

## 2022-12-24 DIAGNOSIS — G4700 Insomnia, unspecified: Secondary | ICD-10-CM | POA: Diagnosis present

## 2022-12-24 DIAGNOSIS — Z91199 Patient's noncompliance with other medical treatment and regimen due to unspecified reason: Secondary | ICD-10-CM

## 2022-12-24 DIAGNOSIS — F17210 Nicotine dependence, cigarettes, uncomplicated: Secondary | ICD-10-CM | POA: Diagnosis present

## 2022-12-24 DIAGNOSIS — I10 Essential (primary) hypertension: Secondary | ICD-10-CM | POA: Diagnosis present

## 2022-12-24 DIAGNOSIS — F10239 Alcohol dependence with withdrawal, unspecified: Secondary | ICD-10-CM | POA: Diagnosis present

## 2022-12-24 DIAGNOSIS — Z20822 Contact with and (suspected) exposure to covid-19: Secondary | ICD-10-CM | POA: Diagnosis present

## 2022-12-24 DIAGNOSIS — F1020 Alcohol dependence, uncomplicated: Secondary | ICD-10-CM | POA: Diagnosis present

## 2022-12-24 DIAGNOSIS — Z5900 Homelessness unspecified: Secondary | ICD-10-CM | POA: Diagnosis not present

## 2022-12-24 DIAGNOSIS — F1510 Other stimulant abuse, uncomplicated: Secondary | ICD-10-CM | POA: Diagnosis present

## 2022-12-24 LAB — RESP PANEL BY RT-PCR (RSV, FLU A&B, COVID)  RVPGX2
Influenza A by PCR: NEGATIVE
Influenza B by PCR: NEGATIVE
Resp Syncytial Virus by PCR: NEGATIVE
SARS Coronavirus 2 by RT PCR: NEGATIVE

## 2022-12-24 MED ORDER — DIPHENHYDRAMINE HCL 25 MG PO CAPS: 50.0000 mg | ORAL_CAPSULE | Freq: Three times a day (TID) | ORAL | Status: DC | PRN

## 2022-12-24 MED ORDER — VITAMIN B-1 100 MG PO TABS: 100.0000 mg | ORAL_TABLET | Freq: Every day | ORAL | Status: DC

## 2022-12-24 MED ORDER — LORAZEPAM 1 MG PO TABS: 1.0000 mg | ORAL_TABLET | Freq: Every day | ORAL | Status: DC

## 2022-12-24 MED ORDER — GABAPENTIN 100 MG PO CAPS
200.0000 mg | ORAL_CAPSULE | Freq: Two times a day (BID) | ORAL | Status: DC
  Administered 2022-12-24: 200 mg via ORAL
  Filled 2022-12-24 (×4): qty 2

## 2022-12-24 MED ORDER — LORAZEPAM 2 MG/ML IJ SOLN: 2.0000 mg | Freq: Three times a day (TID) | INTRAMUSCULAR | Status: DC | PRN

## 2022-12-24 MED ORDER — VITAMIN B-1 100 MG PO TABS
100.0000 mg | ORAL_TABLET | Freq: Every day | ORAL | Status: DC
  Administered 2022-12-25 – 2022-12-26 (×2): 100 mg via ORAL
  Filled 2022-12-24 (×6): qty 1

## 2022-12-24 MED ORDER — ADULT MULTIVITAMIN W/MINERALS CH
1.0000 | ORAL_TABLET | Freq: Every day | ORAL | Status: DC
  Administered 2022-12-24 – 2022-12-26 (×3): 1 via ORAL
  Filled 2022-12-24 (×8): qty 1

## 2022-12-24 MED ORDER — HALOPERIDOL LACTATE 5 MG/ML IJ SOLN: 5.0000 mg | Freq: Three times a day (TID) | INTRAMUSCULAR | Status: DC | PRN

## 2022-12-24 MED ORDER — THIAMINE HCL 100 MG/ML IJ SOLN: 100.0000 mg | Freq: Once | INTRAMUSCULAR | Status: DC

## 2022-12-24 MED ORDER — FLUOXETINE HCL 20 MG PO CAPS
20.0000 mg | ORAL_CAPSULE | Freq: Every day | ORAL | Status: DC
  Administered 2022-12-24 – 2022-12-26 (×3): 20 mg via ORAL
  Filled 2022-12-24 (×6): qty 1

## 2022-12-24 MED ORDER — LORAZEPAM 1 MG PO TABS: 1.0000 mg | ORAL_TABLET | Freq: Four times a day (QID) | ORAL | Status: DC | PRN

## 2022-12-24 MED ORDER — LORAZEPAM 1 MG PO TABS: 0.0000 mg | ORAL_TABLET | Freq: Four times a day (QID) | ORAL | Status: DC

## 2022-12-24 MED ORDER — LORAZEPAM 1 MG PO TABS: 0.0000 mg | ORAL_TABLET | Freq: Two times a day (BID) | ORAL | Status: DC

## 2022-12-24 MED ORDER — LORAZEPAM 1 MG PO TABS: 2.0000 mg | ORAL_TABLET | Freq: Three times a day (TID) | ORAL | Status: DC | PRN

## 2022-12-24 MED ORDER — GABAPENTIN 100 MG PO CAPS
100.0000 mg | ORAL_CAPSULE | Freq: Two times a day (BID) | ORAL | Status: DC
  Administered 2022-12-25 – 2022-12-26 (×4): 100 mg via ORAL
  Filled 2022-12-24 (×9): qty 1

## 2022-12-24 MED ORDER — HALOPERIDOL 5 MG PO TABS: 5.0000 mg | ORAL_TABLET | Freq: Three times a day (TID) | ORAL | Status: DC | PRN

## 2022-12-24 MED ORDER — QUETIAPINE FUMARATE ER 50 MG PO TB24
50.0000 mg | ORAL_TABLET | Freq: Every day | ORAL | Status: DC
  Administered 2022-12-24 – 2022-12-26 (×3): 50 mg via ORAL
  Filled 2022-12-24 (×8): qty 1

## 2022-12-24 MED ORDER — LORAZEPAM 1 MG PO TABS: 1.0000 mg | ORAL_TABLET | Freq: Four times a day (QID) | ORAL | Status: DC

## 2022-12-24 MED ORDER — THIAMINE HCL 100 MG/ML IJ SOLN: 100.0000 mg | Freq: Every day | INTRAMUSCULAR | Status: DC

## 2022-12-24 MED ORDER — ALUM & MAG HYDROXIDE-SIMETH 200-200-20 MG/5ML PO SUSP: 30.0000 mL | ORAL | Status: DC | PRN

## 2022-12-24 MED ORDER — LORAZEPAM 1 MG PO TABS: 1.0000 mg | ORAL_TABLET | Freq: Two times a day (BID) | ORAL | Status: DC

## 2022-12-24 MED ORDER — HYDROXYZINE HCL 25 MG PO TABS: 25.0000 mg | ORAL_TABLET | Freq: Four times a day (QID) | ORAL | Status: DC | PRN

## 2022-12-24 MED ORDER — NICOTINE 14 MG/24HR TD PT24
14.0000 mg | MEDICATED_PATCH | Freq: Every day | TRANSDERMAL | Status: DC
  Filled 2022-12-24 (×6): qty 1

## 2022-12-24 MED ORDER — LORAZEPAM 1 MG PO TABS
1.0000 mg | ORAL_TABLET | Freq: Three times a day (TID) | ORAL | Status: AC
  Administered 2022-12-25 – 2022-12-26 (×3): 1 mg via ORAL
  Filled 2022-12-24 (×3): qty 1

## 2022-12-24 MED ORDER — DIPHENHYDRAMINE HCL 50 MG/ML IJ SOLN: 50.0000 mg | Freq: Three times a day (TID) | INTRAMUSCULAR | Status: DC | PRN

## 2022-12-24 MED ORDER — TRAZODONE HCL 50 MG PO TABS: 50.0000 mg | ORAL_TABLET | Freq: Every evening | ORAL | Status: DC | PRN

## 2022-12-24 MED ORDER — QUETIAPINE FUMARATE 50 MG PO TABS
50.0000 mg | ORAL_TABLET | Freq: Every evening | ORAL | Status: DC | PRN
  Administered 2022-12-25 – 2022-12-26 (×2): 50 mg via ORAL
  Filled 2022-12-24: qty 1

## 2022-12-24 MED ORDER — LOPERAMIDE HCL 2 MG PO CAPS: 2.0000 mg | ORAL_CAPSULE | ORAL | Status: DC | PRN

## 2022-12-24 MED ORDER — LORAZEPAM 1 MG PO TABS
1.0000 mg | ORAL_TABLET | Freq: Two times a day (BID) | ORAL | Status: DC
  Administered 2022-12-26: 1 mg via ORAL
  Filled 2022-12-24 (×2): qty 1

## 2022-12-24 MED ORDER — ONDANSETRON 4 MG PO TBDP: 4.0000 mg | ORAL_TABLET | Freq: Four times a day (QID) | ORAL | Status: DC | PRN

## 2022-12-24 MED ORDER — LORAZEPAM 1 MG PO TABS
1.0000 mg | ORAL_TABLET | Freq: Four times a day (QID) | ORAL | Status: AC
  Administered 2022-12-24 – 2022-12-25 (×3): 1 mg via ORAL
  Filled 2022-12-24 (×3): qty 1

## 2022-12-24 MED ORDER — MAGNESIUM HYDROXIDE 400 MG/5ML PO SUSP: 30.0000 mL | Freq: Every day | ORAL | Status: DC | PRN

## 2022-12-24 MED ORDER — ACETAMINOPHEN 325 MG PO TABS: 650.0000 mg | ORAL_TABLET | Freq: Four times a day (QID) | ORAL | Status: DC | PRN

## 2022-12-24 NOTE — Progress Notes (Signed)
52 year old male admitted to 306-2 under the services of Dr. Caswell Corwin. Patient presented to the [facility] with SI and alcohol intoxication. Patient reports depression r/t being unable to stay clean. He reports depression 9/10 and anxiety 9/10. Patient currently denies SI/HI/AVH. Pt presents with a flat and depressed affect. He is allergic to seafood. Skin assessment was WNL. Pt reports he drinks one case of beer daily and uses cocaine 3-4 times a month. Denies any pain at this time. Denies physical, verbal or sexual abuse in the past or present. Pt is calm and cooperative, he was given lunch and oriented to the unit and his room. Q36mn checks remain in place.

## 2022-12-24 NOTE — BHH Suicide Risk Assessment (Signed)
[facility] Admission Suicide Risk Assessment   Nursing information obtained from:  Patient Demographic factors:  Male, Low socioeconomic status Current Mental Status:  NA Loss Factors:  Financial problems / change in socioeconomic status Historical Factors:  NA Risk Reduction Factors:  Sense of responsibility to family, Employed  Total Time spent with patient: 20 minutes Principal Problem: MDD (major depressive disorder), recurrent severe, without psychosis ([place]) Diagnosis:  Principal Problem:   MDD (major depressive disorder), recurrent severe, without psychosis ([place]) Active Problems:   Alcohol use disorder, severe, dependence ([place])  Subjective Data: See H&P   Continued Clinical Symptoms:  Alcohol Use Disorder Identification Test Final Score (AUDIT): 28 The "Alcohol Use Disorders Identification Test", Guidelines for Use in Primary Care, Second Edition.  World Pharmacologist [facility]). Score between 0-7:  no or low risk or alcohol related problems. Score between 8-15:  moderate risk of alcohol related problems. Score between 16-19:  high risk of alcohol related problems. Score 20 or above:  warrants further diagnostic evaluation for alcohol dependence and treatment.   CLINICAL FACTORS:   Severe Anxiety and/or Agitation Depression:   Anhedonia Hopelessness Severe Alcohol/Substance Abuse/Dependencies Unstable or Poor Therapeutic Relationship     Psychiatric Specialty Exam:  Presentation  General Appearance:  Disheveled  Eye Contact: Poor  Speech: Normal Rate  Speech Volume: Normal  Handedness: Right   Mood and Affect  Mood: Depressed  Affect: Depressed   Thought Process  Thought Processes: Linear  Descriptions of Associations:Intact  Orientation:Full (Time, Place and Person)  Thought Content:Logical  History of Schizophrenia/Schizoaffective disorder:No data recorded Duration of Psychotic Symptoms:No data recorded Hallucinations:Hallucinations:  None  Ideas of Reference:None  Suicidal Thoughts:Suicidal Thoughts: Yes, Passive SI Active Intent and/or Plan: Without Intent; Without Plan  Homicidal Thoughts:Homicidal Thoughts: No   Sensorium  Memory: Immediate Good; Recent Good; Remote Good  Judgment: Impaired  Insight: Lacking   Executive Functions  Concentration: Poor  Attention Span: Poor    Psychomotor Activity  Psychomotor Activity: Psychomotor Activity: Normal   Assets  Assets: Communication Skills   Sleep  Sleep: Sleep: Poor    Physical Exam: Physical Exam See H&P ROS  See H&P Blood pressure 114/85, pulse (!) 104, temperature 98.9 F (37.2 C), temperature source Oral, resp. rate 16, height 6' 2"$  (1.88 m), weight 77.1 kg, SpO2 99 %. Body mass index is 21.83 kg/m.   COGNITIVE FEATURES THAT CONTRIBUTE TO RISK:  None    SUICIDE RISK:   Moderate:  Frequent suicidal ideation with limited intensity, and duration, some specificity in terms of plans, no associated intent, good self-control, limited dysphoria/symptomatology, some risk factors present, and identifiable protective factors, including available and accessible social support.  PLAN OF CARE: See H&P  I certify that inpatient services furnished can reasonably be expected to improve the patient's condition.   Christoper Allegra, MD 12/24/2022, 5:04 PM

## 2022-12-24 NOTE — Tx Team (Signed)
Initial Treatment Plan 12/24/2022 12:38 PM [name] [id number]    PATIENT STRESSORS: Substance abuse     PATIENT STRENGTHS: Ability for insight   PATIENT IDENTIFIED PROBLEMS: Substance abuse treatment  for alcohol withdrawal                     DISCHARGE CRITERIA:  Ability to meet basic life and health needs Improved stabilization in mood, thinking, and/or behavior  PRELIMINARY DISCHARGE PLAN: Return to previous living arrangement  PATIENT/FAMILY INVOLVEMENT: This treatment plan has been presented to and reviewed with the patient, Chad Sanchez. The patient has been given the opportunity to ask questions and make suggestions.  Fritz Pickerel, RN 12/24/2022, 12:38 PM

## 2022-12-24 NOTE — Group Note (Signed)
LCSW Group Therapy Note  Group Date: 12/24/2022 Start Time: 1100 End Time: 1200   Type of Therapy and Topic:  Group Therapy - How To Cope with Nervousness about Discharge   Participation Level:  Did Not Attend   Description of Group This process group involved identification of patients' feelings about discharge. Some of them are scheduled to be discharged soon, while others are new admissions, but each of them was asked to share thoughts and feelings surrounding discharge from the hospital. One common theme was that they are excited at the prospect of going home, while another was that many of them are apprehensive about sharing why they were hospitalized. Patients were given the opportunity to discuss these feelings with their peers in preparation for discharge.  Therapeutic Goals  Patient will identify their overall feelings about pending discharge. Patient will think about how they might proactively address issues that they believe will once again arise once they get home (i.e. with parents). Patients will participate in discussion about having hope for change.    Therapeutic Modalities Cognitive Behavioral Therapy   Windle Guard, LCSW 12/24/2022  3:08 PM

## 2022-12-24 NOTE — H&P (Signed)
Psychiatric Admission Assessment Adult  Patient Identification: Chad Sanchez MRN:  [id number] Date of Evaluation:  12/24/2022 Chief Complaint:  Alcohol use disorder, severe, dependence ([place]) [F10.20] Principal Diagnosis: MDD (major depressive disorder), recurrent severe, without psychosis ([place]) Diagnosis:  Principal Problem:   MDD (major depressive disorder), recurrent severe, without psychosis ([place]) Active Problems:   Alcohol use disorder, severe, dependence ([place])  History of Present Illness:  Patient is a 52 year old male with a reported psychiatric history of bipolar disorder and anxiety disorder, who was admitted to the psychiatric hospital for worsening depression, suicidal thoughts, and alcohol detox.  Prior to admission psychiatric medications: Patient reports not taking psychiatric medications for quite some time  Patient reports worsening depression for some time in the context of interpersonal instability and relationships, unstable housing, financial struggles, and substance use.  He reports worsening suicidal thoughts over the last week or so.  At this time continues to report having suicidal thoughts without any intent.  He reports that his mood is down depressed and sad.  Reports anhedonia.  Reports feeling hopeless helpless.  He reports sleeping because he passes out after drinking too much alcohol -he cannot comment on any difficulty initiating or maintaining sleep due to the effects of alcohol on his sleep patterns.  Reports appetite is fluctuating.  Denies any changes in concentration.  Denies any HI.  Reports anxiety is excessive generalized and bothersome.  Denies any panic attacks.  Denies having psychotic symptoms.  Denies having symptoms of manic episode or hypomanic episode at this time or in the past.  He denies any history of trauma or abuse.  Past psychiatric history: Patient reports being diagnosed with bipolar disorder and anxiety in the past.  Patient reports  history of multiple psychiatric hospitalizations.  Patient denies a history of any suicide attempts.  Patient reports not taking any medications for quite some time but is unable to specify if this is a months or years.  Reports taking Seroquel in the past.  Denies knowledge of other medication trials but reports he has taken other psychiatric medications in the past.  Past medical history: Patient denies any history of chronic or acute medical illness.  Denies any history of seizures.  Denies any history of surgeries.  NKDA.  Substance use history: Patient reports drinking 1 case of beer per day for quite some time.  Reports last drink was yesterday.  Denies any history of seizures or been hospitalized for alcohol withdrawal.  Patient reports using cocaine about 4-5 times a month, last use yesterday.  Patient reports smoking about 1/2 pack of cigarettes per day.  Denies any other illicit substance use.  Family history: Unknown as patient was adopted at 63 weeks old.  Social history: Patient reports he is more raised and lives in [place].  Reports he is single.  Reports he has been sober.  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   Total Time spent with patient: 20 minutes    Is the patient at risk to self? Yes.    Has the patient been a risk to self in the past 6 months? No.  Has the patient been a risk to self within the distant past? No.  Is the patient a risk to others? No.  Has the patient been a risk to others in the past 6 months? No.  Has the patient been a risk to others within the distant past? No.   [place] Scale:  [place] Admission (Current) from  12/24/2022 in [place] [facility] ED from 12/23/2022 in [facility] Emergency Department at [facility] ED from 07/08/2022 in [facility] Emergency Department at [place] No Risk High Risk No Risk        Prior Inpatient Therapy: Yes.   If yes,  describe multiple Prior Outpatient Therapy: Unsure if yes, describe not current  Alcohol Screening: 1. How often do you have a drink containing alcohol?: 4 or more times a week 2. How many drinks containing alcohol do you have on a typical day when you are drinking?: 3 or 4 3. How often do you have six or more drinks on one occasion?: Daily or almost daily AUDIT-C Score: 9 4. How often during the last year have you found that you were not able to stop drinking once you had started?: Weekly 5. How often during the last year have you failed to do what was normally expected from you because of drinking?: Weekly 6. How often during the last year have you needed a first drink in the morning to get yourself going after a heavy drinking session?: Weekly 7. How often during the last year have you had a feeling of guilt of remorse after drinking?: Weekly 8. How often during the last year have you been unable to remember what happened the night before because you had been drinking?: Weekly 9. Have you or someone else been injured as a result of your drinking?: No 10. Has a relative or friend or a doctor or another health worker been concerned about your drinking or suggested you cut down?: Yes, during the last year Alcohol Use Disorder Identification Test Final Score (AUDIT): 28 Alcohol Brief Interventions/Follow-up: Alcohol education/Brief advice Substance Abuse History in the last 12 months:  Yes.   Consequences of Substance Abuse: Negative Medical Consequences:  psych decline Family Consequences:  isolation Withdrawal Symptoms:   Cramps Headaches Tremors Previous Psychotropic Medications: Yes  Psychological Evaluations: Yes  Past Medical History:  Past Medical History:  Diagnosis Date   Alcohol abuse    Anxiety    Depression    Hypertension    Sickle cell anemia (HCC)     Past Surgical History:  Procedure Laterality Date   TONSILLECTOMY     Family History:  Family History  Problem  Relation Age of Onset   Cirrhosis Mother     Tobacco Screening:  Social History   Tobacco Use  Smoking Status Every Day   Packs/day: 0.50   Types: Cigarettes  Smokeless Tobacco Never    BH Tobacco Counseling     Are you interested in Tobacco Cessation Medications?  No, patient refused Counseled patient on smoking cessation:  Yes Reason Tobacco Screening Not Completed: No value filed.       Social History:  Social History   Substance and Sexual Activity  Alcohol Use Yes   Alcohol/week: 12.0 standard drinks of alcohol   Types: 12 Cans of beer per week   Comment: a case a day; pt told this Probation officer 12 pk daily or a fifth of vodka     Social History   Substance and Sexual Activity  Drug Use Yes   Types: "Crack" cocaine, Cocaine   Comment: last used 2 weeks ago    Additional Social History:                           Allergies:   Allergies  Allergen  Reactions   Fish Allergy Anaphylaxis   Shellfish Allergy Anaphylaxis   Lab Results:  Results for orders placed or performed during the hospital encounter of 12/23/22 (from the past 48 hour(s))  Rapid urine drug screen (hospital performed)     Status: Abnormal   Collection Time: 12/23/22  6:51 PM  Result Value Ref Range   Opiates NONE DETECTED NONE DETECTED   Cocaine POSITIVE (A) NONE DETECTED   Benzodiazepines NONE DETECTED NONE DETECTED   Amphetamines NONE DETECTED NONE DETECTED   Tetrahydrocannabinol NONE DETECTED NONE DETECTED   Barbiturates NONE DETECTED NONE DETECTED    Comment: (NOTE) DRUG SCREEN FOR MEDICAL PURPOSES ONLY.  IF CONFIRMATION IS NEEDED FOR ANY PURPOSE, NOTIFY LAB WITHIN 5 DAYS.  LOWEST DETECTABLE LIMITS FOR URINE DRUG SCREEN Drug Class                     Cutoff (ng/mL) Amphetamine and metabolites    1000 Barbiturate and metabolites    200 Benzodiazepine                 200 Opiates and metabolites        300 Cocaine and metabolites        300 THC                             50 Performed at [facility], [place] [address], [place], [place] [address]   Ethanol     Status: None   Collection Time: 12/23/22  7:00 PM  Result Value Ref Range   Alcohol, Ethyl (B) <10 <10 mg/dL    Comment: (NOTE) Lowest detectable limit for serum alcohol is 10 mg/dL.  For medical purposes only. Performed at [facility], [place] [address], [place], [place] [id number]   Salicylate level     Status: Abnormal   Collection Time: 12/23/22  7:00 PM  Result Value Ref Range   Salicylate Lvl [id number] (L) 7.0 - 30.0 mg/dL    Comment: Performed at [facility], [place] [address], [place], [place] [address]  Acetaminophen level     Status: Abnormal   Collection Time: 12/23/22  7:00 PM  Result Value Ref Range   Acetaminophen (Tylenol), Serum <10 (L) 10 - 30 ug/mL    Comment: (NOTE) Therapeutic concentrations vary significantly. A range of 10-30 ug/mL  may be an effective concentration for many patients. However, some  are best treated at concentrations outside of this range. Acetaminophen concentrations >150 ug/mL at 4 hours after ingestion  and >50 ug/mL at 12 hours after ingestion are often associated with  toxic reactions.  Performed at [facility], [place] [address] [place], [place] [address]   Resp panel by RT-PCR (RSV, Flu A&B, Covid) Anterior Nasal Swab     Status: None   Collection Time: 12/24/22  2:07 AM   Specimen: Anterior Nasal Swab  Result Value Ref Range   SARS Coronavirus 2 by RT PCR NEGATIVE NEGATIVE    Comment: (NOTE) SARS-CoV-2 target nucleic acids are NOT DETECTED.  The SARS-CoV-2 RNA is generally detectable in upper respiratory specimens during the acute phase of infection. The lowest concentration of SARS-CoV-2 viral copies this assay can detect is 138 copies/mL. A negative result does not preclude SARS-Cov-2 infection and should not be used as the sole basis for treatment or other patient  management decisions. A negative result may occur with  improper specimen collection/handling, submission  of specimen other than nasopharyngeal swab, presence of viral mutation(s) within the areas targeted by this assay, and inadequate number of viral copies(<138 copies/mL). A negative result must be combined with clinical observations, patient history, and epidemiological information. The expected result is Negative.  Fact Sheet for Patients:  EntrepreneurPulse.com.au  Fact Sheet for Healthcare Providers:  IncredibleEmployment.be  This test is no t yet approved or cleared by the [place] FDA and  has been authorized for detection and/or diagnosis of SARS-CoV-2 by FDA under an Emergency Use Authorization (EUA). This EUA will remain  in effect (meaning this test can be used) for the duration of the COVID-19 declaration under Section 564(b)(1) of the Act, 21 U.S.C.section 360bbb-3(b)(1), unless the authorization is terminated  or revoked sooner.       Influenza A by PCR NEGATIVE NEGATIVE   Influenza B by PCR NEGATIVE NEGATIVE    Comment: (NOTE) The Xpert Xpress SARS-CoV-2/FLU/RSV plus assay is intended as an aid in the diagnosis of influenza from Nasopharyngeal swab specimens and should not be used as a sole basis for treatment. Nasal washings and aspirates are unacceptable for Xpert Xpress SARS-CoV-2/FLU/RSV testing.  Fact Sheet for Patients: EntrepreneurPulse.com.au  Fact Sheet for Healthcare Providers: IncredibleEmployment.be  This test is not yet approved or cleared by the [place] FDA and has been authorized for detection and/or diagnosis of SARS-CoV-2 by FDA under an Emergency Use Authorization (EUA). This EUA will remain in effect (meaning this test can be used) for the duration of the COVID-19 declaration under Section 564(b)(1) of the Act, 21 U.S.C. section 360bbb-3(b)(1), unless the  authorization is terminated or revoked.     Resp Syncytial Virus by PCR NEGATIVE NEGATIVE    Comment: (NOTE) Fact Sheet for Patients: EntrepreneurPulse.com.au  Fact Sheet for Healthcare Providers: IncredibleEmployment.be  This test is not yet approved or cleared by the [place] FDA and has been authorized for detection and/or diagnosis of SARS-CoV-2 by FDA under an Emergency Use Authorization (EUA). This EUA will remain in effect (meaning this test can be used) for the duration of the COVID-19 declaration under Section 564(b)(1) of the Act, 21 U.S.C. section 360bbb-3(b)(1), unless the authorization is terminated or revoked.  Performed at [facility], [place] [address] [place], [place] [address]     Blood Alcohol level:  Lab Results  Component Value Date   ETH <10 12/23/2022   ETH 320 (HH) 0000000    Metabolic Disorder Labs:  No results found for: "HGBA1C", "MPG" No results found for: "PROLACTIN" No results found for: "CHOL", "TRIG", "HDL", "CHOLHDL", "VLDL", "LDLCALC"  Current Medications: Current Facility-Administered Medications  Medication Dose Route Frequency Provider Last Rate Last Admin   acetaminophen (TYLENOL) tablet 650 mg  650 mg Oral Q6H PRN [place], Josephine C, NP       alum & mag hydroxide-simeth (MAALOX/MYLANTA) 200-200-20 MG/5ML suspension 30 mL  30 mL Oral Q4H PRN Cleatrice Burke, Josephine C, NP       haloperidol (HALDOL) tablet 5 mg  5 mg Oral TID PRN Janine Limbo, MD       And   LORazepam (ATIVAN) tablet 2 mg  2 mg Oral TID PRN Sheva Mcdougle, Ovid Curd, MD       And   diphenhydrAMINE (BENADRYL) capsule 50 mg  50 mg Oral TID PRN Sandeep Radell, Ovid Curd, MD       haloperidol lactate (HALDOL) injection 5 mg  5 mg Intramuscular TID PRN Janine Limbo, MD       And   LORazepam (ATIVAN) injection  2 mg  2 mg Intramuscular TID PRN Rashidi Loh, Ovid Curd, MD       And   diphenhydrAMINE (BENADRYL) injection 50  mg  50 mg Intramuscular TID PRN Amya Hlad, Ovid Curd, MD       FLUoxetine (PROZAC) capsule 20 mg  20 mg Oral Daily Charmaine Downs C, NP   20 mg at 12/24/22 1437   [START ON 12/25/2022] gabapentin (NEURONTIN) capsule 100 mg  100 mg Oral BID Devarius Nelles, Ovid Curd, MD       hydrOXYzine (ATARAX) tablet 25 mg  25 mg Oral Q6H PRN Kash Davie, Ovid Curd, MD       loperamide (IMODIUM) capsule 2-4 mg  2-4 mg Oral PRN Marly Schuld, Ovid Curd, MD       LORazepam (ATIVAN) tablet 1 mg  1 mg Oral Q6H PRN Chloe Baig, Ovid Curd, MD       LORazepam (ATIVAN) tablet 1 mg  1 mg Oral QID Faatima Tench, Ovid Curd, MD   1 mg at 12/24/22 1436   Followed by   Derrill Memo ON 12/25/2022] LORazepam (ATIVAN) tablet 1 mg  1 mg Oral TID Janine Limbo, MD       Followed by   Derrill Memo ON 12/26/2022] LORazepam (ATIVAN) tablet 1 mg  1 mg Oral BID Toma Erichsen, Ovid Curd, MD       Followed by   Derrill Memo ON 12/28/2022] LORazepam (ATIVAN) tablet 1 mg  1 mg Oral Daily Brittane Grudzinski, Ovid Curd, MD       magnesium hydroxide (MILK OF MAGNESIA) suspension 30 mL  30 mL Oral Daily PRN Delfin Gant, NP       multivitamin with minerals tablet 1 tablet  1 tablet Oral Daily Judianne Seiple, MD   1 tablet at 12/24/22 1436   ondansetron (ZOFRAN-ODT) disintegrating tablet 4 mg  4 mg Oral Q6H PRN Zaidin Blyden, Ovid Curd, MD       QUEtiapine (SEROQUEL XR) 24 hr tablet 50 mg  50 mg Oral QHS Dayanara Sherrill, Ovid Curd, MD       QUEtiapine (SEROQUEL) tablet 50 mg  50 mg Oral QHS PRN Janine Limbo, MD       Derrill Memo ON 12/25/2022] thiamine (Vitamin B-1) tablet 100 mg  100 mg Oral Daily Kadijah Shamoon, MD       PTA Medications: Medications Prior to Admission  Medication Sig Dispense Refill Last Dose   chlordiazePOXIDE (LIBRIUM) 25 MG capsule 47m PO TID x 1D, then 25-542mPO BID X 1D, then 25-5024mO QD X 1D 10 capsule 0    FLUoxetine (PROZAC) 20 MG capsule Take 1 capsule (20 mg total) by mouth daily. (Patient not taking: Reported on 12/23/2022) 14 capsule 0    gabapentin (NEURONTIN)  100 MG capsule Take 2 capsules (200 mg total) by mouth 2 (two) times daily. (Patient not taking: Reported on 12/23/2022) 56 capsule 0    Multiple Vitamin (MULTIVITAMIN WITH MINERALS) TABS tablet Take 1 tablet by mouth daily. (Patient not taking: Reported on 12/23/2022)      sertraline (ZOLOFT) 100 MG tablet Take 1 tablet (100 mg total) by mouth daily. (Patient not taking: Reported on 12/23/2022) 30 tablet 0    traZODone (DESYREL) 100 MG tablet Take 1 tablet (100 mg total) by mouth at bedtime. (Patient not taking: Reported on 12/23/2022) 30 tablet 0     Musculoskeletal: Strength & Muscle Tone: within normal limits Gait & Station: normal Patient leans: N/A            Psychiatric Specialty Exam:  Presentation  General Appearance:  Disheveled  Eye Contact: Poor  Speech: Normal Rate  Speech Volume: Normal  Handedness: Right   Mood and Affect  Mood: Depressed  Affect: Depressed   Thought Process  Thought Processes: Linear  Duration of Psychotic Symptoms: weeks Past Diagnosis of Schizophrenia or Psychoactive disorder: No data recorded Descriptions of Associations:Intact  Orientation:Full (Time, Place and Person)  Thought Content:Logical  Hallucinations:Hallucinations: None  Ideas of Reference:None  Suicidal Thoughts:Suicidal Thoughts: Yes, Passive SI Active Intent and/or Plan: Without Intent; Without Plan  Homicidal Thoughts:Homicidal Thoughts: No   Sensorium  Memory: Immediate Good; Recent Good; Remote Good  Judgment: Impaired  Insight: Lacking   Executive Functions  Concentration: Poor  Attention Span: Poor  Recall: Good  Fund of Knowledge: Good  Language: Good   Psychomotor Activity  Psychomotor Activity: Psychomotor Activity: Normal   Assets  Assets: Communication Skills   Sleep  Sleep: Sleep: Poor    Physical Exam: Physical Exam Vitals reviewed.  Constitutional:      Appearance: He is normal weight.   Neurological:     Mental Status: He is alert.     Motor: No weakness.     Gait: Gait normal.    Review of Systems  Constitutional:  Negative for chills and fever.  Cardiovascular:  Negative for chest pain and palpitations.  Neurological:  Negative for dizziness, tingling, tremors and headaches.  Psychiatric/Behavioral:  Positive for depression, substance abuse and suicidal ideas. Negative for hallucinations. The patient is nervous/anxious and has insomnia.    Blood pressure 114/85, pulse (!) 104, temperature 98.9 F (37.2 C), temperature source Oral, resp. rate 16, height 6' 2"$  (1.88 m), weight 77.1 kg, SpO2 99 %. Body mass index is 21.83 kg/m.  Treatment Plan Summary: Daily contact with patient to assess and evaluate symptoms and progress in treatment and Medication management  ASSESSMENT:  Diagnoses / Active Problems: -MDD severe recurrent without psychotic features.  Patient reports being previously diagnosed with bipolar disorder but he denies having any symptoms meeting criteria for mania or hypomanic episode at this time or in the past. -GAD -Alcohol use disorder, severe -Stimulant use disorder   PLAN: Safety and Monitoring:  --  Voluntary admission to inpatient psychiatric unit for safety, stabilization and treatment  -- Daily contact with patient to assess and evaluate symptoms and progress in treatment  -- Patient's case to be discussed in multi-disciplinary team meeting  -- Observation Level : q15 minute checks  -- Vital signs:  q12 hours  -- Precautions: suicide, elopement, and assault  2. Psychiatric Diagnoses and Treatment:   -Start Seroquel XR 50 mg nightly.   -Start Seroquel IR 50 mg nightly as needed-for insomnia and treatment of mood disorder.  Patient reports Seroquel worked well in the past for mood disorder and insomnia. -Start fluoxetine 20 mg once daily for MDD and GAD -Start gabapentin 100 mg 3 times daily -alcohol use disorder and anxiety in context  of substance use disorder.  -Start CIWA with scheduled Ativan taper for alcohol use disorder and alcohol withdrawal  -Start Haldol oral and intramuscular PRN for agitation protocol  --  The risks/benefits/side-effects/alternatives to this medication were discussed in detail with the patient and time was given for questions. The patient consents to medication trial.    -- Metabolic profile and EKG monitoring obtained while on an atypical antipsychotic (BMI: Lipid Panel: HbgA1c: QTc:)   -- Encouraged patient to participate in unit milieu and in scheduled group therapies   -- Short Term Goals: Ability to identify changes in lifestyle to reduce recurrence of condition will improve, Ability to verbalize  feelings will improve, Ability to disclose and discuss suicidal ideas, Ability to demonstrate self-control will improve, Ability to identify and develop effective coping behaviors will improve, Ability to maintain clinical measurements within normal limits will improve, Compliance with prescribed medications will improve, and Ability to identify triggers associated with substance abuse/mental health issues will improve  -- Long Term Goals: Improvement in symptoms so as ready for discharge    3. Medical Issues Being Addressed:   Tobacco Use Disorder  -- Nicotine patch 14 mg/24 hours ordered  -- Smoking cessation encouraged  4. Discharge Planning:   -- Social work and case management to assist with discharge planning and identification of hospital follow-up needs prior to discharge  -- Estimated LOS: 5-7 days  -- Discharge Concerns: Need to establish a safety plan; Medication compliance and effectiveness  -- Discharge Goals: Return home with outpatient referrals for mental health follow-up including medication management/psychotherapy     Observation Level/Precautions:  15 minute checks  Laboratory: As above  Psychotherapy:    Medications:    Consultations:    Discharge Concerns:     Estimated LOS: 5 to 7 days  Other:     Physician Treatment Plan for Primary Diagnosis: MDD (major depressive disorder), recurrent severe, without psychosis ([place])   Physician Treatment Plan for Secondary Diagnosis: Principal Problem:   MDD (major depressive disorder), recurrent severe, without psychosis ([place]) Active Problems:   Alcohol use disorder, severe, dependence ([place])    I certify that inpatient services furnished can reasonably be expected to improve the patient's condition.    Christoper Allegra, MD 2/20/20245:05 PM  Total Time Spent in Direct Patient Care:  I personally spent 60 minutes on the unit in direct patient care. The direct patient care time included face-to-face time with the patient, reviewing the patient's chart, communicating with other professionals, and coordinating care. Greater than 50% of this time was spent in counseling or coordinating care with the patient regarding goals of hospitalization, psycho-education, and discharge planning needs.   Janine Limbo, MD Psychiatrist

## 2022-12-24 NOTE — Progress Notes (Signed)
LCSW Progress Note  [id number]   Chad Sanchez  12/24/2022  9:01 AM  Description:   Inpatient Psychiatric Referral  Patient was recommended inpatient per Charmaine Downs, NP. There are no available beds at [facility]. Per Charmaine Downs, NP, pt was denied by Ellin Mayhew due to recent admission less than 30 days ago. Patient was referred to the following facilities:   Destination  Service Provider Address Phone Fax  [facility] [place]  [address] [place], [place] [address] [phone] [facility]  [address] [place] [address] [place]  [facility]  [address] [place] [address] [phone]  [place] [address] [place] [address] [phone]  [facility]  [address], [place] [address] [phone]  [facility]  [address], [place] [address] [phone]  [facility]  [address] [place] [address] [place]  [address] [place] [address] [phone]  [facility]  [place], [place] [address] [phone]  [facility] [place] [address] [phone]  [facility]  [place] [place] [address] [phone] [place], [place] [phone] [facility]  [place], [place] [address] [phone] [facility]  [phone] [place] [address] [phone] [facility]  [address] [place], [place] [address] [phone]  [facility]  [address] [place] [address] [phone] [facility]  [address], [place] [address] [phone] [facility]  [address] [place] [address] [facility]  [address] [place] [address] [phone]  [facility]  [address], [place] [address] [phone] [place]  [address] [place] [address] [phone]  [facility]  [address], [place] [address] [phone] [place]  [address], [place] [address] [phone]  [facility]  [address] [place] [address] [place]  [facility]  [address], [place] [address] [phone] [place]  [place], [place]  [address] [phone]    Situation ongoing, [name] to continue following and update chart as more information becomes available.      Denna Haggard, [place]  12/24/2022 9:01 AM

## 2022-12-24 NOTE — Group Note (Unsigned)
Recreation Therapy Group Note   Group Topic:Animal Assisted Therapy   Group Date: 12/24/2022 Start Time: 1430 End Time: 1500 Facilitators: Mysty Kielty-McCall, Calli Bashor A, NT Location: 300 Hall Dayroom       Affect/Mood: {RT BHH Affect/Mood:26271}   Participation Level: {RT BHH Participation [employer]   Participation Quality: {RT BHH Participation Quality:26268}   Behavior: {RT BHH Group Behavior:26269}   Speech/Thought Process: {RT BHH Speech/Thought:26276}   Insight: {RT BHH Insight:26272}   Judgement: {RT BHH Judgement:26278}   Modes of Intervention: {RT BHH Modes of Intervention:26277}   Patient Response to Interventions:  {RT BHH Patient Response to Intervention:26274}   Education Outcome:  {RT [place] Education Outcome:26279}   Clinical Observations/Individualized Feedback: *** was *** in their participation of session activities and group discussion. Pt identified ***   Plan: {RT BHH Tx Plan:26280}   Trygve Thal A Keilyn Haggard-McCall, NT,  12/24/2022 3:19 PM

## 2022-12-24 NOTE — Progress Notes (Signed)
This [name] requested that [place], RN review pt for [place]. Pt meets inpatient criteria per Delfin Gant, NP. [name] added first shift Disposition Denna Haggard, LCSWA to follow up if follow is not communicated back to this [name] prior to this [name] 2nd shift ending.    Benjaman Kindler, MSW, LCSWA 12/24/2022 12:08 AM

## 2022-12-24 NOTE — Progress Notes (Signed)
Inpatient Behavioral Health Placement  Meets inpatient criteria per Delfin Gant, NP.  There are no available beds at [facility] per [facility] Wynonia Hazard, RN. Referral was sent to the following facilities;    Destination  Service Provider Address Phone [facility]  [address] [place], [place] [address] [phone] [facility]  [address] [place] [address] [place]  [facility]  [address] [place] [address] [phone] [place]., [place] [address] [phone]  [facility]  [address] [place] [address] [phone]  [facility]  [address], [place] [address] [phone] [place]  [address] [place] [address] [phone]  [facility]  [address] [place] [address] [place]  [address] [place] [address] [phone]  [facility]  [place], [place] [address] [phone]  [facility] [address] [place] [address] [phone]  [facility]  [place] [address], [place] [address] [id number]   Situation ongoing,  [name] will follow up.   Benjaman Kindler, MSW, [facility] 12/24/2022  @ 12:39 AM

## 2022-12-24 NOTE — Plan of Care (Signed)
Admission note to follow.      Problem: Education: Goal: Knowledge of disease or condition will improve Outcome: Progressing Goal: Understanding of discharge needs will improve Outcome: Progressing   Problem: Health Behavior/Discharge Planning: Goal: Ability to identify changes in lifestyle to reduce recurrence of condition will improve Outcome: Progressing Goal: Identification of resources available to assist in meeting health care needs will improve Outcome: Progressing

## 2022-12-24 NOTE — Progress Notes (Signed)
Pt was accepted to [facility] 12/24/22 PENDING Negative COVID-19; Bed Assignment Mateo Flow 2E.    Pt meets inpatient criteria per Charmaine Downs, NP    Attending Physician will be Dr. Kathlene Cote   Report can be called to: [phone]   Pt can arrive after 9:00am per pending item   Care Team notified: Nash Mantis, RN - Wyn[place] Hazard, [facility] - [facility] - EDP.

## 2022-12-24 NOTE — Progress Notes (Signed)
Adult Psychoeducational Group Note  Date:  12/24/2022 Time:  9:29 PM  Group Topic/Focus:  Wrap-Up Group:   The focus of this group is to help patients review their daily goal of treatment and discuss progress on daily workbooks. Did Not Attend Participation Level:    Participation Quality:   did not attend  Affect:   did not attend  Cognitive:   did not attend  Insight: None  Engagement in Group:   did not attend  Modes of Intervention:   did not attend  Additional Comments:  did not attend  Debe Coder 12/24/2022, 9:29 PM

## 2022-12-24 NOTE — Progress Notes (Addendum)
[facility] 12[facility]/20[facility], pending signed voluntary consent faxed to [phone]. Bed assignment: 306-1  Pt meets inpatient criteria per Charmaine Downs, NP  Attending Physician will be Janine Limbo, MD  Report can be called to: - Adult unit: [phone] or [phone]   Pt can arrive after 12 PM  Care Team Notified: [facility] Lynnda Shields, RN, Luanna Salk, RN, Wilmon Arms, RN, and Charmaine Downs, NP  [place], [place]  12/24/2022 10:11 AM

## 2022-12-24 NOTE — Group Note (Deleted)
Recreation Therapy Group Note   Group Topic:Other  Group Date: 12/24/2022 Start Time: 1005 End Time: 1050 Facilitators: Jaishon Krisher-McCall, Yaziel Brandon A, NT Location: 500 Hall Dayroom       Affect/Mood: {RT BHH Affect/Mood:26271}   Participation Level: {RT BHH Participation [employer]   Participation Quality: {RT BHH Participation Quality:26268}   Behavior: {RT BHH Group Behavior:26269}   Speech/Thought Process: {RT BHH Speech/Thought:26276}   Insight: {RT BHH Insight:26272}   Judgement: {RT BHH Judgement:26278}   Modes of Intervention: {RT BHH Modes of Intervention:26277}   Patient Response to Interventions:  {RT BHH Patient Response to Intervention:26274}   Education Outcome:  {RT [place] Education Outcome:26279}   Clinical Observations/Individualized Feedback: *** was *** in their participation of session activities and group discussion. Pt identified ***   Plan: {RT BHH Tx Plan:26280}   Chad Sanchez A Filipe [place]-McCall, NT,  12/24/2022 12:05 PM

## 2022-12-24 NOTE — ED Notes (Signed)
Patient discharged off unit to [facility] per provider. Patient alert, cooperative, no s/s of distress at this time. Patient discharge information and belongings given to [facility] for transport. Patient ambulatory off unit, escorted by RN. Patient transported by [facility]

## 2022-12-25 ENCOUNTER — Encounter (HOSPITAL_COMMUNITY): Payer: Self-pay

## 2022-12-25 NOTE — Progress Notes (Signed)
Psychoeducational Group Note  Date:  12/25/2022 Time:  2139  Group Topic/Focus:  Relapse Prevention Planning:   The focus of this group is to define relapse and discuss the need for planning to combat relapse.  Participation Level: Did Not Attend  Participation Quality:  Not Applicable  Affect:  Not Applicable  Cognitive:  Not Applicable  Insight:  Not Applicable  Engagement in Group: Not Applicable  Additional Comments:  The patient did not attend the evening group.   Gennette Pac 12/25/2022, 9:39 PM

## 2022-12-25 NOTE — Progress Notes (Signed)
[facility] MD Progress Note  12/25/2022 4:35 PM Chad Sanchez  MRN:  [id number]  Reason for admission: 52 year old male with a reported psychiatric history of bipolar disorder and anxiety disorder, who was admitted to the psychiatric hospital for worsening depression, suicidal thoughts, and alcohol detox.  Prior to admission psychiatric medications: Patient reports not taking psychiatric medications for quite some time  Daily notes: Chad Sanchez is seen, chart reviewed. The chart findings discussed with the treatment team. He presents alert, oriented & aware of situation. He is visible on the unit, only during meal times & times for medication administration times. He presents with a flat/restricted affect. He is verbally responsive seem a bit irritated as if he is in a bad mood. He reports, "I'm here because I was drinking too much alcohol & using cocaine. I have been doing this for a long time, only that it has worsened in the 4-5 months. I'm doing alright now, still depressed. I'm still having the shakes from alcohol withdrawal. I would want substance abuse treatment program in this area, however, if nothing works out here for me in terms of long term treatment, I would not mind going back to the [place], [place] area.  My depression today is #7 & anxiety #10. I don't why I feel so anxious today. I have not been to any of the group sessions as of yet. Slept alright last night". Chad Sanchez currently denies any SIHI, AVH, delusional thoughts or paranoia. He does not appear to be responding to any internal stimuli. There no changes made on his current plan of care, continue as already in progress. Reviewed vital signs, stable. He is instructed & encouraged to ask his nurse for something for anxiety.  Principal Problem: MDD (major depressive disorder), recurrent severe, without psychosis ([place])  Diagnosis: Principal Problem:   MDD (major depressive disorder), recurrent severe, without psychosis ([place]) Active Problems:    Alcohol use disorder, severe, dependence ([place])  Total Time spent with patient:  35 minutes  Past Psychiatric History: See H&P.  Past Medical History:  Past Medical History:  Diagnosis Date   Alcohol abuse    Anxiety    Depression    Hypertension    Sickle cell anemia (HCC)     Past Surgical History:  Procedure Laterality Date   TONSILLECTOMY     Family History:  Family History  Problem Relation Age of Onset   Cirrhosis Mother    Family Psychiatric  History: See H&P.  Social History:  Social History   Substance and Sexual Activity  Alcohol Use Yes   Alcohol/week: 12.0 standard drinks of alcohol   Types: 12 Cans of beer per week   Comment: a case a day; pt told this Probation officer 12 pk daily or a fifth of vodka     Social History   Substance and Sexual Activity  Drug Use Yes   Types: "Crack" cocaine, Cocaine   Comment: last used 2 weeks ago    Social History   Socioeconomic History   Marital status: Single    Spouse name: Not on file   Number of children: Not on file   Years of education: Not on file   Highest education level: Not on file  Occupational History   Not on file  Tobacco Use   Smoking status: Every Day    Packs/day: 0.50    Types: Cigarettes   Smokeless tobacco: Never  Vaping Use   Vaping Use: Never used  Substance and Sexual Activity   Alcohol use:  Yes    Alcohol/week: 12.0 standard drinks of alcohol    Types: 12 Cans of beer per week    Comment: a case a day; pt told this writer 12 pk daily or a fifth of vodka   Drug use: Yes    Types: "Crack" cocaine, Cocaine    Comment: last used 2 weeks ago   Sexual activity: Not Currently  Other Topics Concern   Not on file  Social History Narrative   Not on file   Social Determinants of Health   Financial Resource Strain: Not on file  Food Insecurity: Food Insecurity Present (12/24/2022)   Hunger Vital Sign    Worried About Running Out of Food in the Last Year: Sometimes true    Ran Out of Food  in the Last Year: Sometimes true  Transportation Needs: No Transportation Needs (12/24/2022)   PRAPARE - Hydrologist (Medical): No    Lack of Transportation (Non-Medical): No  Physical Activity: Not on file  Stress: Not on file  Social Connections: Not on file   Additional Social History:   Sleep: Good  Appetite:  Good  Current Medications: Current Facility-Administered Medications  Medication Dose Route Frequency Provider Last Rate Last Admin   acetaminophen (TYLENOL) tablet 650 mg  650 mg Oral Q6H PRN Onuoha, Josephine C, NP       alum & mag hydroxide-simeth (MAALOX/MYLANTA) 200-200-20 MG/5ML suspension 30 mL  30 mL Oral Q4H PRN Cleatrice Burke, Josephine C, NP       haloperidol (HALDOL) tablet 5 mg  5 mg Oral TID PRN Massengill, Ovid Curd, MD       And   LORazepam (ATIVAN) tablet 2 mg  2 mg Oral TID PRN Massengill, Ovid Curd, MD       And   diphenhydrAMINE (BENADRYL) capsule 50 mg  50 mg Oral TID PRN Massengill, Ovid Curd, MD       haloperidol lactate (HALDOL) injection 5 mg  5 mg Intramuscular TID PRN Massengill, Ovid Curd, MD       And   LORazepam (ATIVAN) injection 2 mg  2 mg Intramuscular TID PRN Massengill, Ovid Curd, MD       And   diphenhydrAMINE (BENADRYL) injection 50 mg  50 mg Intramuscular TID PRN Massengill, Ovid Curd, MD       FLUoxetine (PROZAC) capsule 20 mg  20 mg Oral Daily Cleatrice Burke, Josephine C, NP   20 mg at 12/25/22 [id number]   gabapentin (NEURONTIN) capsule 100 mg  100 mg Oral BID Janine Limbo, MD   100 mg at 12/25/22 [id number]   hydrOXYzine (ATARAX) tablet 25 mg  25 mg Oral Q6H PRN Massengill, Ovid Curd, MD       loperamide (IMODIUM) capsule 2-4 mg  2-4 mg Oral PRN Massengill, Ovid Curd, MD       LORazepam (ATIVAN) tablet 1 mg  1 mg Oral Q6H PRN Massengill, Ovid Curd, MD       LORazepam (ATIVAN) tablet 1 mg  1 mg Oral TID Janine Limbo, MD   1 mg at 12/25/22 1143   Followed by   Derrill Memo ON 12/26/2022] LORazepam (ATIVAN) tablet 1 mg  1 mg Oral BID Massengill, Ovid Curd,  MD       Followed by   Derrill Memo ON 12/28/2022] LORazepam (ATIVAN) tablet 1 mg  1 mg Oral Daily Massengill, Nathan, MD       magnesium hydroxide (MILK OF MAGNESIA) suspension 30 mL  30 mL Oral Daily PRN Delfin Gant, NP       multivitamin  with minerals tablet 1 tablet  1 tablet Oral Daily Massengill, Nathan, MD   1 tablet at 12/25/22 [id number]   nicotine (NICODERM CQ - dosed in mg/24 hours) patch 14 mg  14 mg Transdermal Daily Massengill, Ovid Curd, MD       ondansetron (ZOFRAN-ODT) disintegrating tablet 4 mg  4 mg Oral Q6H PRN Massengill, Ovid Curd, MD       QUEtiapine (SEROQUEL XR) 24 hr tablet 50 mg  50 mg Oral QHS Massengill, Ovid Curd, MD   50 mg at 12/24/22 2115   QUEtiapine (SEROQUEL) tablet 50 mg  50 mg Oral QHS PRN Massengill, Ovid Curd, MD       thiamine (Vitamin B-1) tablet 100 mg  100 mg Oral Daily Massengill, Ovid Curd, MD   100 mg at 12/25/22 [id number]   Lab Results:  Results for orders placed or performed during the hospital encounter of 12/23/22 (from the past 48 hour(s))  Rapid urine drug screen (hospital performed)     Status: Abnormal   Collection Time: 12/23/22  6:51 PM  Result Value Ref Range   Opiates NONE DETECTED NONE DETECTED   Cocaine POSITIVE (A) NONE DETECTED   Benzodiazepines NONE DETECTED NONE DETECTED   Amphetamines NONE DETECTED NONE DETECTED   Tetrahydrocannabinol NONE DETECTED NONE DETECTED   Barbiturates NONE DETECTED NONE DETECTED    Comment: (NOTE) DRUG SCREEN FOR MEDICAL PURPOSES ONLY.  IF CONFIRMATION IS NEEDED FOR ANY PURPOSE, NOTIFY LAB WITHIN 5 DAYS.  LOWEST DETECTABLE LIMITS FOR URINE DRUG SCREEN Drug Class                     Cutoff (ng/mL) Amphetamine and metabolites    1000 Barbiturate and metabolites    200 Benzodiazepine                 200 Opiates and metabolites        300 Cocaine and metabolites        300 THC                            50 Performed at [facility], [place] [address] [place], [place] [address]   Ethanol     Status:  None   Collection Time: 12/23/22  7:00 PM  Result Value Ref Range   Alcohol, Ethyl (B) <10 <10 mg/dL    Comment: (NOTE) Lowest detectable limit for serum alcohol is 10 mg/dL.  For medical purposes only. Performed at [facility], [place] [address], [place], [place] [id number]   Salicylate level     Status: Abnormal   Collection Time: 12/23/22  7:00 PM  Result Value Ref Range   Salicylate Lvl [id number] (L) 7.0 - 30.0 mg/dL    Comment: Performed at [facility], [place] [address], [place], [place] [address]  Acetaminophen level     Status: Abnormal   Collection Time: 12/23/22  7:00 PM  Result Value Ref Range   Acetaminophen (Tylenol), Serum <10 (L) 10 - 30 ug/mL    Comment: (NOTE) Therapeutic concentrations vary significantly. A range of 10-30 ug/mL  may be an effective concentration for many patients. However, some  are best treated at concentrations outside of this range. Acetaminophen concentrations >150 ug/mL at 4 hours after ingestion  and >50 ug/mL at 12 hours after ingestion are often associated with  toxic reactions.  Performed at [facility], [place] [address], [place], [place] [address]   Resp panel  by RT-PCR (RSV, Flu A&B, Covid) Anterior Nasal Swab     Status: None   Collection Time: 12/24/22  2:07 AM   Specimen: Anterior Nasal Swab  Result Value Ref Range   SARS Coronavirus 2 by RT PCR NEGATIVE NEGATIVE    Comment: (NOTE) SARS-CoV-2 target nucleic acids are NOT DETECTED.  The SARS-CoV-2 RNA is generally detectable in upper respiratory specimens during the acute phase of infection. The lowest concentration of SARS-CoV-2 viral copies this assay can detect is 138 copies/mL. A negative result does not preclude SARS-Cov-2 infection and should not be used as the sole basis for treatment or other patient management decisions. A negative result may occur with  improper specimen collection/handling, submission of specimen  other than nasopharyngeal swab, presence of viral mutation(s) within the areas targeted by this assay, and inadequate number of viral copies(<138 copies/mL). A negative result must be combined with clinical observations, patient history, and epidemiological information. The expected result is Negative.  Fact Sheet for Patients:  EntrepreneurPulse.com.au  Fact Sheet for Healthcare Providers:  IncredibleEmployment.be  This test is no t yet approved or cleared by the [place] FDA and  has been authorized for detection and/or diagnosis of SARS-CoV-2 by FDA under an Emergency Use Authorization (EUA). This EUA will remain  in effect (meaning this test can be used) for the duration of the COVID-19 declaration under Section 564(b)(1) of the Act, 21 U.S.C.section 360bbb-3(b)(1), unless the authorization is terminated  or revoked sooner.       Influenza A by PCR NEGATIVE NEGATIVE   Influenza B by PCR NEGATIVE NEGATIVE    Comment: (NOTE) The Xpert Xpress SARS-CoV-2/FLU/RSV plus assay is intended as an aid in the diagnosis of influenza from Nasopharyngeal swab specimens and should not be used as a sole basis for treatment. Nasal washings and aspirates are unacceptable for Xpert Xpress SARS-CoV-2/FLU/RSV testing.  Fact Sheet for Patients: EntrepreneurPulse.com.au  Fact Sheet for Healthcare Providers: IncredibleEmployment.be  This test is not yet approved or cleared by the [place] FDA and has been authorized for detection and/or diagnosis of SARS-CoV-2 by FDA under an Emergency Use Authorization (EUA). This EUA will remain in effect (meaning this test can be used) for the duration of the COVID-19 declaration under Section 564(b)(1) of the Act, 21 U.S.C. section 360bbb-3(b)(1), unless the authorization is terminated or revoked.     Resp Syncytial Virus by PCR NEGATIVE NEGATIVE    Comment: (NOTE) Fact  Sheet for Patients: EntrepreneurPulse.com.au  Fact Sheet for Healthcare Providers: IncredibleEmployment.be  This test is not yet approved or cleared by the [place] FDA and has been authorized for detection and/or diagnosis of SARS-CoV-2 by FDA under an Emergency Use Authorization (EUA). This EUA will remain in effect (meaning this test can be used) for the duration of the COVID-19 declaration under Section 564(b)(1) of the Act, 21 U.S.C. section 360bbb-3(b)(1), unless the authorization is terminated or revoked.  Performed at [facility], [place] [address], [place], [place] [address]    Blood Alcohol level:  Lab Results  Component Value Date   ETH <10 12/23/2022   ETH 320 (HH) 0000000   Metabolic Disorder Labs: No results found for: "HGBA1C", "MPG" No results found for: "PROLACTIN" No results found for: "CHOL", "TRIG", "HDL", "CHOLHDL", "VLDL", "LDLCALC"  Physical Findings: AIMS:  , ,  ,  ,    CIWA:  CIWA-Ar Total: 3 COWS:     Musculoskeletal: Strength & Muscle Tone: within normal limits Gait & Station: normal Patient leans:  N/A  Psychiatric Specialty Exam:  Presentation  General Appearance:  Disheveled  Eye Contact: Poor  Speech: Normal Rate  Speech Volume: Normal  Handedness: Right   Mood and Affect  Mood: Depressed  Affect: Depressed  Thought Process  Thought Processes: Linear  Descriptions of Associations:Intact  Orientation:Full (Time, Place and Person)  Thought Content:Logical  History of Schizophrenia/Schizoaffective disorder:No data recorded Duration of Psychotic Symptoms:No data recorded Hallucinations:Hallucinations: None  Ideas of Reference:None  Suicidal Thoughts:Suicidal Thoughts: Yes, Passive SI Active Intent and/or Plan: Without Intent; Without Plan  Homicidal Thoughts:Homicidal Thoughts: No   Sensorium  Memory: Immediate Good; Recent Good; Remote  Good  Judgment: Impaired  Insight: Lacking  Executive Functions  Concentration: Poor  Attention Span: Poor  Recall: Good  Fund of Knowledge: Good  Language: Good  Psychomotor Activity  Psychomotor Activity: Psychomotor Activity: Normal  Assets  Assets: Communication Skills  Sleep  Sleep: Sleep: Poor  Physical Exam: Physical Exam Vitals and nursing note reviewed.  HENT:     Head: Normocephalic.     Nose: Nose normal.     Mouth/Throat:     Pharynx: Oropharynx is clear.  Eyes:     Pupils: Pupils are equal, round, and reactive to light.  Cardiovascular:     Rate and Rhythm: Normal rate.     Pulses: Normal pulses.  Pulmonary:     Effort: Pulmonary effort is normal.  Genitourinary:    Comments: Deferred Musculoskeletal:        General: Normal range of motion.     Cervical back: Normal range of motion.  Skin:    General: Skin is warm and dry.  Neurological:     General: No focal deficit present.     Mental Status: He is alert and oriented to person, place, and time.    Review of Systems  Constitutional:  Negative for chills, diaphoresis and fever.  HENT:  Negative for congestion and sore throat.   Eyes:  Negative for blurred vision.  Respiratory:  Negative for cough, shortness of breath and wheezing.   Cardiovascular:  Negative for chest pain and palpitations.  Gastrointestinal:  Negative for abdominal pain, constipation, diarrhea, heartburn, nausea and vomiting.  Genitourinary:  Negative for dysuria.  Musculoskeletal:  Negative for falls, joint pain and myalgias.  Skin:  Negative for itching and rash.  Neurological:  Negative for dizziness, tingling, tremors, sensory change, speech change, focal weakness, seizures, loss of consciousness, weakness and headaches.  Endo/Heme/Allergies:        Allergies: Fish  Psychiatric/Behavioral:  Positive for depression and substance abuse (Hx. ETOH & cocaine use disorders.). Negative for hallucinations, memory  loss and suicidal ideas. The patient is nervous/anxious and has insomnia.    Blood pressure 136/89, pulse 82, temperature 98.4 F (36.9 C), temperature source Oral, resp. rate 20, height 6' 2"$  (1.88 m), weight 77.1 kg, SpO2 100 %. Body mass index is 21.83 kg/m.  Treatment Plan Summary: Daily contact with patient to assess and evaluate symptoms and progress in treatment and Medication management.   Continue inpatient hospitalization. Will continue today 12/25/2022 plan as below except where it is noted.   Diagnoses / Active Problems: -MDD severe recurrent without psychotic features.  Patient reports being previously diagnosed with bipolar disorder but he denies having any symptoms meeting criteria for mania or hypomanic episode at this time or in the past. -GAD -Alcohol use disorder, severe -Stimulant use disorder     PLAN: Safety and Monitoring:             --  Voluntary admission to inpatient psychiatric unit for safety, stabilization and treatment             -- Daily contact with patient to assess and evaluate symptoms and progress in treatment             -- Patient's case to be discussed in multi-disciplinary team meeting             -- Observation Level : q15 minute checks             -- Vital signs:  q12 hours             -- Precautions: suicide, elopement, and assault   2. Psychiatric Diagnoses and Treatment:    -Continue Seroquel XR 50 mg nightly.   -Continue Seroquel IR 50 mg nightly as needed-for insomnia and treatment of mood disorder.  Patient reports Seroquel worked well in the past for mood disorder and insomnia. -Continue fluoxetine 20 mg once daily for MDD and GAD -Continue gabapentin 100 mg 3 times daily -alcohol use disorder and anxiety in context of substance use disorder.   -Continue CIWA with scheduled Ativan taper for alcohol use disorder and alcohol withdrawal   -Continue Haldol oral and intramuscular PRN for agitation protocol   --  The  risks/benefits/side-effects/alternatives to this medication were discussed in detail with the patient and time was given for questions. The patient consents to medication trial.                -- Metabolic profile and EKG monitoring obtained while on an atypical antipsychotic (BMI: Lipid Panel: HbgA1c: QTc:)              -- Encouraged patient to participate in unit milieu and in scheduled group therapies              -- Short Term Goals: Ability to identify changes in lifestyle to reduce recurrence of condition will improve, Ability to verbalize feelings will improve, Ability to disclose and discuss suicidal ideas, Ability to demonstrate self-control will improve, Ability to identify and develop effective coping behaviors will improve, Ability to maintain clinical measurements within normal limits will improve, Compliance with prescribed medications will improve, and Ability to identify triggers associated with substance abuse/mental health issues will improve             -- Long Term Goals: Improvement in symptoms so as ready for discharge                3. Medical Issues Being Addressed:              Tobacco Use Disorder             -- Nicotine patch 14 mg/24 hours ordered             -- Smoking cessation encouraged.    4. Discharge Planning:              -- Social work and case management to assist with discharge planning and identification of hospital follow-up needs prior to discharge             -- Estimated LOS: 5-7 days             -- Discharge Concerns: Need to establish a safety plan; Medication compliance and effectiveness             -- Discharge Goals: Return home with outpatient referrals for mental health follow-up including medication management/psychotherapy   Lindell Spar, NP, pmhnp, fnp-bc. 12/25/2022,  4:35 PM

## 2022-12-25 NOTE — Plan of Care (Signed)
  Problem: Education: Goal: Knowledge of disease or condition will improve Outcome: Progressing   Problem: Physical Regulation: Goal: Complications related to the disease process, condition or treatment will be avoided or minimized Outcome: Progressing   Problem: Safety: Goal: Ability to remain free from injury will improve Outcome: Progressing   Problem: Medication: Goal: Compliance with prescribed medication regimen will improve Outcome: Progressing

## 2022-12-25 NOTE — Progress Notes (Signed)
D: Pt denies SI/HI/AVH. Pt is pleasant and cooperative. Patient reports feeling better since admission.   A: Pt was offered support and encouragement. Pt was given scheduled medications. Pt was encourage to attend groups. Q 15 minute checks were done for safety.  R:Pt attends groups and interacts well with peers and staff. Pt is taking medication. Pt has no complaints.Pt receptive to treatment and safety maintained on unit.

## 2022-12-25 NOTE — BHH Suicide Risk Assessment (Signed)
[place] INPATIENT:  Family/Significant Other Suicide Prevention Education  Suicide Prevention Education:  Patient Refusal for Family/Significant Other Suicide Prevention Education: The patient Chad Sanchez has refused to provide written consent for family/significant other to be provided Family/Significant Other Suicide Prevention Education during admission and/or prior to discharge.  Physician notified.  Chad Sanchez 12/25/2022, 11:35 AM

## 2022-12-25 NOTE — Group Note (Unsigned)
Date:  12/25/2022 Time:  11:09 AM  Group Topic/Focus:  Personal Choices and Values:   The focus of this group is to help patients assess and explore the importance of values in their lives, how their values affect their decisions, how they express their values and what opposes their expression.     Participation Level:  {BHH PARTICIPATION HD:[id number]  Participation Quality:  {BHH PARTICIPATION QUALITY:22265}  Affect:  {BHH AFFECT:22266}  Cognitive:  {BHH COGNITIVE:22267}  Insight: {BHH Insight2:20797}  Engagement in Group:  {BHH ENGAGEMENT IN J[place]:[id number]  Modes of Intervention:  {BHH MODES OF INTERVENTION:22269}  Additional Comments:  ***  Chad Sanchez 12/25/2022, 11:09 AM

## 2022-12-25 NOTE — Progress Notes (Signed)
D: Pt denied SI/HI/AVH this morning. Patient has been in an irritable mood throughout the day and interacts minimally on the unit. Pt has been cooperative throughout the shift.   A: RN provided support and encouragement to patient. Pt given scheduled medications as prescribed. Q15 min checks verified for safety.    R: Patient verbally contracts for safety. Patient compliant with medications and treatment plan. Pt is safe on the unit.   12/25/22 1000  Psych Admission Type (Psych Patients Only)  Admission Status Voluntary  Psychosocial Assessment  Patient Complaints Substance abuse  Eye Contact Brief  Facial Expression Sad;Flat  Affect Depressed;Sad  Speech Logical/coherent  Interaction Assertive  Motor Activity Slow  Appearance/Hygiene Unremarkable  Behavior Characteristics Appropriate to situation  Mood Sad;Depressed  Thought Process  Coherency WDL  Content WDL  Delusions None reported or observed  Perception WDL  Hallucination None reported or observed  Judgment Impaired  Confusion None  Danger to Self  Current suicidal ideation? Denies  Description of Suicide Plan No plan  Agreement Not to Harm Self Yes  Description of Agreement Pt verbally contracts for safety  Danger to Others  Danger to Others None reported or observed

## 2022-12-25 NOTE — [facility] Counselor (Signed)
Adult Comprehensive Assessment  Patient ID: Chad Sanchez, male   DOB: 1970-11-20, 52 y.o.   MRN: [id number]  Information Source: Information source: Patient  Current Stressors:  Patient states their primary concerns and needs for treatment are:: pt reports ETOH dependence daily consumption of approximately 15 Beers and state that he also smokes Cocaine 3x monthly Patient states their goals for this hospitilization and ongoing recovery are:: Detox/Housing resources Educational / Learning stressors: none reported Employment / Job issues: pt expressed concerns about being unsure if he is still employed with [employer]  in [place] and attributes this recently relapsing Family Relationships: pt denies being connected with all of his family members Museum/gallery curator / Lack of resources (include bankruptcy): pt denies Housing / Lack of housing: pt reports being homeless and residing at a shelter in [place]. pt is concerned about his personal belongings, stating"I relapsed and forgot to call them about my things" Physical health (include injuries & life threatening diseases): none reported Social relationships: pt denies Substance abuse: Daily ETOH dependence 12-15 Beers daily Cocaine 3x per month Bereavement / Loss: pt denies  Living/Environment/Situation:  Living Arrangements: Other (Comment) (Homeless/Shelter) Who else lives in the home?: N/A How long has patient lived in current situation?: approximately 3 months What is atmosphere in current home: Supportive, Temporary  Family History:  Marital status: Single Are you sexually active?: No What is your sexual orientation?: Straight Has your sexual activity been affected by drugs, alcohol, medication, or emotional stress?: pt denies  Childhood History:  By whom was/is the patient raised?: Both parents Additional childhood history information: "I was adopted when I was 5 weeks old" Description of patient's relationship with  caregiver when they were a child: "I had a normal relationship with my parents." "I was close with my biological parents as well." Patient's description of current relationship with people who raised him/her: "The same normal" How were you disciplined when you got in trouble as a child/adolescent?: Spankings Does patient have siblings?: Yes Number of Siblings: 8 Description of patient's current relationship with siblings: No contact with siblings.  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 Currently a student?: No Learning disability?: No  Employment/Work Situation:   Employment Situation: Employed Where is Patient Currently Employed?: [employer], [place] has Patient Been Employed?: "a few years off and on" Are You Satisfied With Your Job?: Yes Do You Work More Than One Job?: No Work Stressors: Drinking Patient's Job has Been Impacted by Current Illness: Yes Describe how Patient's Job has Been Impacted: "I feel sick and hungover, to where I dont' want to go into work." What is the Longest Time Patient has Held a Job?: 4 years Where was the Patient Employed at that Time?: [employer]--during program.  Has Patient ever Been in the [employer]?: No  Financial Resources:   Financial resources: Income from employment, Medicaid Does patient have a representative payee or guardian?: No  Alcohol/Substance Abuse:   What has been your use of drugs/alcohol within the last 12 months?: ETOH 12-15 Beers daily Cocaine 3x per month If attempted suicide, did drugs/alcohol play a role in this?: No Alcohol/Substance Abuse Treatment Hx: Past  Tx, Inpatient, Attends AA/NA, Past Tx, Outpatient,  Substance abuse evaluation, Past detox If yes, describe treatment: pt has been in several Tx facilities to include; [facility], [facility]. [facility] and [place]:   Patient's Community Support System: Poor Describe Community Support System: "I am from [place] and I live in [place]. I have tried most of the places in the area and I'm still drinking" Type of faith/religion: Darrick Meigs How does patient's faith help to cope with current illness?: I try to stick to having a Plan  Leisure/Recreation:   Do You Have Hobbies?: Yes Leisure and Hobbies: Reading  Strengths/Needs:   What is the patient's perception of their strengths?: I am resilient Patient states they can use these personal strengths during their treatment to contribute to their recovery: "I just keep trying" Patient states these barriers may affect/interfere with their treatment: none reported Patient states these barriers may affect their return to the community: transportation/homelessness  Discharge Plan:   Currently receiving community mental health services: No Patient states concerns and preferences for aftercare planning are: none reported Patient states they will know when they are safe and ready for discharge when: "I don't know" Does patient have access to transportation?: No Does patient have financial barriers related to discharge medications?: Yes Patient description of barriers related to discharge medications: I don't have any money Plan for no access to transportation at discharge: public transportation Will patient be returning to same living situation after discharge?: Yes  Summary/Recommendations:   Summary and Recommendations (to be completed by the evaluator): pt  is a 52 y.o. male patient admitted with previous hx of Alcoholism Depression and Cocaine use Disorder .came in this evening seeking detox treatment from Alcohol and feeling suicidal.   Patient was discharged from [facility] ER early this morning and returned to [facility] ER. Pt provided past Tx history and asserts that he has received Tx at [facility], [place], [place] and [place] and states that he desires to return to [place] following discharge, in an attempt to salvage his employment at [employer] [facility]). Pt does not desire in patient Tx currently. While here, Mylan can benefit from crisis stabilization, medication management, therapeutic milieu, and referrals for services.  Leroy Pettway S Clydell Sposito. 12/25/2022

## 2022-12-25 NOTE — BH IP Treatment Plan (Signed)
Interdisciplinary Treatment and Diagnostic Plan Update  12/25/2022 Time of Session: 9:20am  Chad Sanchez MRN: [id number]  Principal Diagnosis: MDD (major depressive disorder), recurrent severe, without psychosis ([place])  Secondary Diagnoses: Principal Problem:   MDD (major depressive disorder), recurrent severe, without psychosis ([place]) Active Problems:   Alcohol use disorder, severe, dependence ([place])   Current Medications:  Current Facility-Administered Medications  Medication Dose Route Frequency Provider Last Rate Last Admin   acetaminophen (TYLENOL) tablet 650 mg  650 mg Oral Q6H PRN [place], Josephine C, NP       alum & mag hydroxide-simeth (MAALOX/MYLANTA) 200-200-20 MG/5ML suspension 30 mL  30 mL Oral Q4H PRN Cleatrice Burke, Josephine C, NP       haloperidol (HALDOL) tablet 5 mg  5 mg Oral TID PRN Massengill, Ovid Curd, MD       And   LORazepam (ATIVAN) tablet 2 mg  2 mg Oral TID PRN Massengill, Ovid Curd, MD       And   diphenhydrAMINE (BENADRYL) capsule 50 mg  50 mg Oral TID PRN Massengill, Ovid Curd, MD       haloperidol lactate (HALDOL) injection 5 mg  5 mg Intramuscular TID PRN Massengill, Ovid Curd, MD       And   LORazepam (ATIVAN) injection 2 mg  2 mg Intramuscular TID PRN Massengill, Ovid Curd, MD       And   diphenhydrAMINE (BENADRYL) injection 50 mg  50 mg Intramuscular TID PRN Massengill, Ovid Curd, MD       FLUoxetine (PROZAC) capsule 20 mg  20 mg Oral Daily Charmaine Downs C, NP   20 mg at 12/25/22 [id number]   gabapentin (NEURONTIN) capsule 100 mg  100 mg Oral BID Janine Limbo, MD   100 mg at 12/25/22 [id number]   hydrOXYzine (ATARAX) tablet 25 mg  25 mg Oral Q6H PRN Massengill, Ovid Curd, MD       loperamide (IMODIUM) capsule 2-4 mg  2-4 mg Oral PRN Massengill, Ovid Curd, MD       LORazepam (ATIVAN) tablet 1 mg  1 mg Oral Q6H PRN Massengill, Ovid Curd, MD       LORazepam (ATIVAN) tablet 1 mg  1 mg Oral TID Janine Limbo, MD   1 mg at 12/25/22 1143   Followed by   Derrill Memo ON 12/26/2022]  LORazepam (ATIVAN) tablet 1 mg  1 mg Oral BID Massengill, Ovid Curd, MD       Followed by   Derrill Memo ON 12/28/2022] LORazepam (ATIVAN) tablet 1 mg  1 mg Oral Daily Massengill, Ovid Curd, MD       magnesium hydroxide (MILK OF MAGNESIA) suspension 30 mL  30 mL Oral Daily PRN Delfin Gant, NP       multivitamin with minerals tablet 1 tablet  1 tablet Oral Daily Massengill, Nathan, MD   1 tablet at 12/25/22 [id number]   nicotine (NICODERM CQ - dosed in mg/24 hours) patch 14 mg  14 mg Transdermal Daily Massengill, Ovid Curd, MD       ondansetron (ZOFRAN-ODT) disintegrating tablet 4 mg  4 mg Oral Q6H PRN Massengill, Ovid Curd, MD       QUEtiapine (SEROQUEL XR) 24 hr tablet 50 mg  50 mg Oral QHS Massengill, Ovid Curd, MD   50 mg at 12/24/22 2115   QUEtiapine (SEROQUEL) tablet 50 mg  50 mg Oral QHS PRN Massengill, Ovid Curd, MD       thiamine (Vitamin B-1) tablet 100 mg  100 mg Oral Daily Massengill, Ovid Curd, MD   100 mg at 12/25/22 [id number]   PTA Medications: Medications Prior  to Admission  Medication Sig Dispense Refill Last Dose   chlordiazePOXIDE (LIBRIUM) 25 MG capsule 36m PO TID x 1D, then 25-549mPO BID X 1D, then 25-5040mO QD X 1D 10 capsule 0    FLUoxetine (PROZAC) 20 MG capsule Take 1 capsule (20 mg total) by mouth daily. (Patient not taking: Reported on 12/23/2022) 14 capsule 0    gabapentin (NEURONTIN) 100 MG capsule Take 2 capsules (200 mg total) by mouth 2 (two) times daily. (Patient not taking: Reported on 12/23/2022) 56 capsule 0    Multiple Vitamin (MULTIVITAMIN WITH MINERALS) TABS tablet Take 1 tablet by mouth daily. (Patient not taking: Reported on 12/23/2022)      sertraline (ZOLOFT) 100 MG tablet Take 1 tablet (100 mg total) by mouth daily. (Patient not taking: Reported on 12/23/2022) 30 tablet 0    traZODone (DESYREL) 100 MG tablet Take 1 tablet (100 mg total) by mouth at bedtime. (Patient not taking: Reported on 12/23/2022) 30 tablet 0     Patient Stressors: Substance abuse    Patient Strengths:     Treatment Modalities: Medication Management, Group therapy, Case management,  1 to 1 session with clinician, Psychoeducation, Recreational therapy.   Physician Treatment Plan for Primary Diagnosis: MDD (major depressive disorder), recurrent severe, without psychosis ([place] Term Goal(s): Improvement in symptoms so as ready for discharge   Short Term Goals: Ability to identify changes in lifestyle to reduce recurrence of condition will improve Ability to verbalize feelings will improve Ability to disclose and discuss suicidal ideas Ability to demonstrate self-control will improve Ability to identify and develop effective coping behaviors will improve Ability to maintain clinical measurements within normal limits will improve Compliance with prescribed medications will improve Ability to identify triggers associated with substance abuse/mental health issues will improve  Medication Management: Evaluate patient's response, side effects, and tolerance of medication regimen.  Therapeutic Interventions: 1 to 1 sessions, Unit Group sessions and Medication administration.  Evaluation of Outcomes: Not Met  Physician Treatment Plan for Secondary Diagnosis: Principal Problem:   MDD (major depressive disorder), recurrent severe, without psychosis ([place] Problems:   Alcohol use disorder, severe, dependence ([facility] Term Goal(s): Improvement in symptoms so as ready for discharge   Short Term Goals: Ability to identify changes in lifestyle to reduce recurrence of condition will improve Ability to verbalize feelings will improve Ability to disclose and discuss suicidal ideas Ability to demonstrate self-control will improve Ability to identify and develop effective coping behaviors will improve Ability to maintain clinical measurements within normal limits will improve Compliance with prescribed medications will improve Ability to identify triggers associated with substance  abuse/mental health issues will improve     Medication Management: Evaluate patient's response, side effects, and tolerance of medication regimen.  Therapeutic Interventions: 1 to 1 sessions, Unit Group sessions and Medication administration.  Evaluation of Outcomes: Not Met   RN Treatment Plan for Primary Diagnosis: MDD (major depressive disorder), recurrent severe, without psychosis ([place]CC[place] Term Goal(s): Knowledge of disease and therapeutic regimen to maintain health will improve  Short Term Goals: Ability to remain free from injury will improve, Ability to participate in decision making will improve, Ability to verbalize feelings will improve, Ability to disclose and discuss suicidal ideas, and Ability to identify and develop effective coping behaviors will improve  Medication Management: RN will administer medications as ordered by provider, will assess and evaluate patient's response and provide education to patient for prescribed medication. RN will report any adverse and/or side effects to  prescribing provider.  Therapeutic Interventions: 1 on 1 counseling sessions, Psychoeducation, Medication administration, Evaluate responses to treatment, Monitor vital signs and CBGs as ordered, Perform/monitor CIWA, COWS, AIMS and Fall Risk screenings as ordered, Perform wound care treatments as ordered.  Evaluation of Outcomes: Not Met   LCSW Treatment Plan for Primary Diagnosis: MDD (major depressive disorder), recurrent severe, without psychosis ([place]) Long Term Goal(s): Safe transition to appropriate next level of care at discharge, Engage patient in therapeutic group addressing interpersonal concerns.  Short Term Goals: Engage patient in aftercare planning with referrals and resources, Increase social support, Increase emotional regulation, Facilitate acceptance of mental health diagnosis and concerns, Identify triggers associated with mental health/substance abuse issues, and Increase  skills for wellness and recovery  Therapeutic Interventions: Assess for all discharge needs, 1 to 1 time with Social worker, Explore available resources and support systems, Assess for adequacy in community support network, Educate family and significant other(s) on suicide prevention, Complete Psychosocial Assessment, Interpersonal group therapy.  Evaluation of Outcomes: Not Met   Progress in Treatment: Attending groups: Yes. Participating in groups: Yes. Taking medication as prescribed: Yes. Toleration medication: Yes. Family/Significant other contact made: No, will contact:  Declined Consent Patient understands diagnosis: Yes. Discussing patient identified problems/goals with staff: Yes. Medical problems stabilized or resolved: Yes. Denies suicidal/homicidal ideation: Yes. Issues/concerns per patient self-inventory: No.   New problem(s) identified: No, Describe:  None   New Short Term/Long Term Goal(s): medication stabilization, elimination of SI thoughts, development of comprehensive mental wellness plan.   Patient Goals: "Get sober and back on track with my medications"  Discharge Plan or Barriers: Patient recently admitted. [name] will continue to follow and assess for appropriate referrals and possible discharge planning.   Reason for Continuation of Hospitalization: Anxiety Depression Medication stabilization Suicidal ideation  Estimated Length of Stay: 3 to 7 days   Last 3 [place] Suicide Severity Risk Score: Flowsheet Row Admission (Current) from 12/24/2022 in [facility] ED from 12/23/2022 in [facility] Emergency Department at [facility] ED from 07/08/2022 in [facility] Emergency Department at [place] No Risk High Risk No Risk       Last PHQ 2/9 Scores:     No data to display          Scribe for Treatment Team: Carney Harder 12/25/2022 3:13 PM

## 2022-12-26 NOTE — Progress Notes (Signed)
Adult Psychoeducational Group Note  Date:  12/26/2022 Time:  10:58 AM  Group Topic/Focus:  Goals Group:   The focus of this group is to help patients establish daily goals to achieve during treatment and discuss how the patient can incorporate goal setting into their daily lives to aide in recovery. Orientation:   The focus of this group is to educate the patient on the purpose and policies of crisis stabilization and provide a format to answer questions about their admission.  The group details unit policies and expectations of patients while admitted.  Participation Level:  Pt did not attend orientation/goals group.

## 2022-12-26 NOTE — Plan of Care (Signed)
Patient is not participating in treatment plan.    Problem: Education: Goal: Knowledge of disease or condition will improve Outcome: Not Progressing Goal: Understanding of discharge needs will improve Outcome: Not Progressing   Problem: Health Behavior/Discharge Planning: Goal: Ability to identify changes in lifestyle to reduce recurrence of condition will improve Outcome: Not Progressing Goal: Identification of resources available to assist in meeting health care needs will improve Outcome: Not Progressing   Problem: Physical Regulation: Goal: Complications related to the disease process, condition or treatment will be avoided or minimized Outcome: Not Progressing   Problem: Safety: Goal: Ability to remain free from injury will improve Outcome: Not Progressing   Problem: Education: Goal: Ability to make informed decisions regarding treatment will improve Outcome: Not Progressing   Problem: Coping: Goal: Coping ability will improve Outcome: Not Progressing   Problem: Health Behavior/Discharge Planning: Goal: Identification of resources available to assist in meeting health care needs will improve Outcome: Not Progressing   Problem: Medication: Goal: Compliance with prescribed medication regimen will improve Outcome: Not Progressing   Problem: Self-Concept: Goal: Ability to disclose and discuss suicidal ideas will improve Outcome: Not Progressing Goal: Will verbalize positive feelings about self Outcome: Not Progressing

## 2022-12-26 NOTE — Progress Notes (Signed)
[facility] MD Progress Note  12/26/2022 5:19 PM Chad Sanchez  MRN:  [id number]  Reason for admission: 52 year old male with a reported psychiatric history of bipolar disorder and anxiety disorder, who was admitted to the psychiatric hospital for worsening depression, suicidal thoughts, and alcohol detox.  Prior to admission psychiatric medications: Patient reports not taking psychiatric medications for quite some time  Daily notes: [name] is seen, chart reviewed. The chart findings discussed with the treatment team. He presents alert, oriented & aware of situation. He is visible on the unit today only because there is an order in place for a room lock-out during group sessions & meal times. This was done due to patient has been spending all the time in his room asleep refusing to come out to attend group sessions. This room lock-out is upsetting to patient as he presents today very furious, angry & irritated during this follow-up evaluation. He reports, "I have signed my 72 hour discharge notice. The reason is because I'm aggravated because they locked me out of my room. I have just started taking medicines here. I just got admitted to this hospital. I'm still not feeling my best. You guys will lock me out of my room for me to work around the unit all day like a zombie because the medicines are making me drowsy. You guys wants me to stay up all day sitting on a chair in the day room doing what? I see some patients are in their rooms, laying in bed, their meals brought to them. No one is making them get out of bed to attend group sessions. Well, if this is how it is going to be, I will start refusing medications because nothing makes sense here. I'm trying to get myself out of this place". [name] is informed that attending group sessions is part of his treatment plan & that he is expected to comply". He currently denies any SIHI, AVH, delusional thoughts or paranoia. He does not appear to be responding to any  internal stimuli. We will continue current plan of care as already in progress.  Principal Problem: MDD (major depressive disorder), recurrent severe, without psychosis ([place])  Diagnosis: Principal Problem:   MDD (major depressive disorder), recurrent severe, without psychosis ([place]) Active Problems:   Alcohol use disorder, severe, dependence ([place])  Total Time spent with patient:  35 minutes  Past Psychiatric History: See H&P.  Past Medical History:  Past Medical History:  Diagnosis Date   Alcohol abuse    Anxiety    Depression    Hypertension    Sickle cell anemia (HCC)     Past Surgical History:  Procedure Laterality Date   TONSILLECTOMY     Family History:  Family History  Problem Relation Age of Onset   Cirrhosis Mother    Family Psychiatric  History: See H&P.  Social History:  Social History   Substance and Sexual Activity  Alcohol Use Yes   Alcohol/week: 12.0 standard drinks of alcohol   Types: 12 Cans of beer per week   Comment: a case a day; pt told this Probation officer 12 pk daily or a fifth of vodka     Social History   Substance and Sexual Activity  Drug Use Yes   Types: "Crack" cocaine, Cocaine   Comment: last used 2 weeks ago    Social History   Socioeconomic History   Marital status: Single    Spouse name: Not on file   Number of children: Not on file  Years of education: Not on file   Highest education level: Not on file  Occupational History   Not on file  Tobacco Use   Smoking status: Every Day    Packs/day: 0.50    Types: Cigarettes   Smokeless tobacco: Never  Vaping Use   Vaping Use: Never used  Substance and Sexual Activity   Alcohol use: Yes    Alcohol/week: 12.0 standard drinks of alcohol    Types: 12 Cans of beer per week    Comment: a case a day; pt told this writer 12 pk daily or a fifth of vodka   Drug use: Yes    Types: "Crack" cocaine, Cocaine    Comment: last used 2 weeks ago   Sexual activity: Not Currently  Other Topics  Concern   Not on file  Social History Narrative   Not on file   Social Determinants of Health   Financial Resource Strain: Not on file  Food Insecurity: Food Insecurity Present (12/24/2022)   Hunger Vital Sign    Worried About Running Out of Food in the Last Year: Sometimes true    Ran Out of Food in the Last Year: Sometimes true  Transportation Needs: No Transportation Needs (12/24/2022)   PRAPARE - Hydrologist (Medical): No    Lack of Transportation (Non-Medical): No  Physical Activity: Not on file  Stress: Not on file  Social Connections: Not on file   Additional Social History:   Sleep: Good  Appetite:  Good  Current Medications: Current Facility-Administered Medications  Medication Dose Route Frequency Provider Last Rate Last Admin   acetaminophen (TYLENOL) tablet 650 mg  650 mg Oral Q6H PRN Onuoha, Josephine C, NP       alum & mag hydroxide-simeth (MAALOX/MYLANTA) 200-200-20 MG/5ML suspension 30 mL  30 mL Oral Q4H PRN Cleatrice Burke, Josephine C, NP       haloperidol (HALDOL) tablet 5 mg  5 mg Oral TID PRN Massengill, Ovid Curd, MD       And   LORazepam (ATIVAN) tablet 2 mg  2 mg Oral TID PRN Massengill, Ovid Curd, MD       And   diphenhydrAMINE (BENADRYL) capsule 50 mg  50 mg Oral TID PRN Massengill, Ovid Curd, MD       haloperidol lactate (HALDOL) injection 5 mg  5 mg Intramuscular TID PRN Massengill, Ovid Curd, MD       And   LORazepam (ATIVAN) injection 2 mg  2 mg Intramuscular TID PRN Massengill, Ovid Curd, MD       And   diphenhydrAMINE (BENADRYL) injection 50 mg  50 mg Intramuscular TID PRN Massengill, Ovid Curd, MD       FLUoxetine (PROZAC) capsule 20 mg  20 mg Oral Daily Charmaine Downs C, NP   20 mg at 12/26/22 [id number]   gabapentin (NEURONTIN) capsule 100 mg  100 mg Oral BID Janine Limbo, MD   100 mg at 12/26/22 0820   hydrOXYzine (ATARAX) tablet 25 mg  25 mg Oral Q6H PRN Massengill, Ovid Curd, MD       loperamide (IMODIUM) capsule 2-4 mg  2-4 mg Oral  PRN Massengill, Ovid Curd, MD       LORazepam (ATIVAN) tablet 1 mg  1 mg Oral Q6H PRN Massengill, Ovid Curd, MD       LORazepam (ATIVAN) tablet 1 mg  1 mg Oral BID Massengill, Nathan, MD       Followed by   Derrill Memo ON 12/28/2022] LORazepam (ATIVAN) tablet 1 mg  1 mg Oral  Daily Massengill, Ovid Curd, MD       magnesium hydroxide (MILK OF MAGNESIA) suspension 30 mL  30 mL Oral Daily PRN Delfin Gant, NP       multivitamin with minerals tablet 1 tablet  1 tablet Oral Daily Massengill, Nathan, MD   1 tablet at 12/26/22 0820   nicotine (NICODERM CQ - dosed in mg/24 hours) patch 14 mg  14 mg Transdermal Daily Massengill, Ovid Curd, MD       ondansetron (ZOFRAN-ODT) disintegrating tablet 4 mg  4 mg Oral Q6H PRN Massengill, Ovid Curd, MD       QUEtiapine (SEROQUEL XR) 24 hr tablet 50 mg  50 mg Oral QHS Massengill, Ovid Curd, MD   50 mg at 12/25/22 2116   QUEtiapine (SEROQUEL) tablet 50 mg  50 mg Oral QHS PRN Janine Limbo, MD   50 mg at 12/25/22 2116   thiamine (Vitamin B-1) tablet 100 mg  100 mg Oral Daily Massengill, Ovid Curd, MD   100 mg at 12/26/22 [id number]   Lab Results:  No results found for this or any previous visit (from the past 64 hour(s)).  Blood Alcohol level:  Lab Results  Component Value Date   ETH <10 12/23/2022   ETH 320 (HH) 0000000   Metabolic Disorder Labs: No results found for: "HGBA1C", "MPG" No results found for: "PROLACTIN" No results found for: "CHOL", "TRIG", "HDL", "CHOLHDL", "VLDL", "LDLCALC"  Physical Findings: AIMS:  , ,  ,  ,    CIWA:  CIWA-Ar Total: 3 COWS:     Musculoskeletal: Strength & Muscle Tone: within normal limits Gait & Station: normal Patient leans: N/A  Psychiatric Specialty Exam:  Presentation  General Appearance:  Disheveled  Eye Contact: Fair  Speech: Clear and Coherent; Normal Rate (loud)  Speech Volume: Increased  Handedness: Right   Mood and Affect  Mood: Irritable; Angry  Affect: Congruent; Flat  Thought Process  Thought  Processes: Coherent  Descriptions of Associations:Intact  Orientation:Full (Time, Place and Person)  Thought Content:Logical  History of Schizophrenia/Schizoaffective disorder:No data recorded Duration of Psychotic Symptoms:No data recorded Hallucinations:Hallucinations: None   Ideas of Reference:None  Suicidal Thoughts:Suicidal Thoughts: No SI Active Intent and/or Plan: Without Intent; Without Plan; Without Means to Carry Out; Without Access to Means SI Passive Intent and/or Plan: Without Intent; Without Means to Carry Out; Without Plan; Without Access to Means   Homicidal Thoughts:Homicidal Thoughts: No    Sensorium  Memory: Immediate Good; Recent Good; Remote Good  Judgment: Fair  Insight: Poor  Executive Functions  Concentration: Fair  Attention Span: Fair  Recall: Good  Fund of Knowledge: Good  Language: Good  Psychomotor Activity  Psychomotor Activity: Psychomotor Activity: -- (Irritable)   Assets  Assets: Communication Skills; Physical Health; Resilience  Sleep  Sleep: Sleep: Good Number of Hours of Sleep: 8   Physical Exam: Physical Exam Vitals and nursing note reviewed.  HENT:     Head: Normocephalic.     Nose: Nose normal.     Mouth/Throat:     Pharynx: Oropharynx is clear.  Eyes:     Pupils: Pupils are equal, round, and reactive to light.  Cardiovascular:     Rate and Rhythm: Normal rate.     Pulses: Normal pulses.  Pulmonary:     Effort: Pulmonary effort is normal.  Genitourinary:    Comments: Deferred Musculoskeletal:        General: Normal range of motion.     Cervical back: Normal range of motion.  Skin:    General: Skin  is warm and dry.  Neurological:     General: No focal deficit present.     Mental Status: He is alert and oriented to person, place, and time.    Review of Systems  Constitutional:  Negative for chills, diaphoresis and fever.  HENT:  Negative for congestion and sore throat.   Eyes:   Negative for blurred vision.  Respiratory:  Negative for cough, shortness of breath and wheezing.   Cardiovascular:  Negative for chest pain and palpitations.  Gastrointestinal:  Negative for abdominal pain, constipation, diarrhea, heartburn, nausea and vomiting.  Genitourinary:  Negative for dysuria.  Musculoskeletal:  Negative for falls, joint pain and myalgias.  Skin:  Negative for itching and rash.  Neurological:  Negative for dizziness, tingling, tremors, sensory change, speech change, focal weakness, seizures, loss of consciousness, weakness and headaches.  Endo/Heme/Allergies:        Allergies: Fish  Psychiatric/Behavioral:  Positive for depression and substance abuse (Hx. ETOH & cocaine use disorders.). Negative for hallucinations, memory loss and suicidal ideas. The patient is nervous/anxious and has insomnia.    Blood pressure 112/89, pulse (!) 117, temperature 98.2 F (36.8 C), temperature source Oral, resp. rate 18, height '6\' 2"'$  (1.88 m), weight 77.1 kg, SpO2 99 %. Body mass index is 21.83 kg/m.  Treatment Plan Summary: Daily contact with patient to assess and evaluate symptoms and progress in treatment and Medication management.   Continue inpatient hospitalization. Will continue today 12/26/2022 plan as below except where it is noted.   Diagnoses / Active Problems: -MDD severe recurrent without psychotic features.  Patient reports being previously diagnosed with bipolar disorder but he denies having any symptoms meeting criteria for mania or hypomanic episode at this time or in the past. -GAD -Alcohol use disorder, severe -Stimulant use disorder     PLAN: Safety and Monitoring:             --  Voluntary admission to inpatient psychiatric unit for safety, stabilization and treatment             -- Daily contact with patient to assess and evaluate symptoms and progress in treatment             -- Patient's case to be discussed in multi-disciplinary team meeting              -- Observation Level : q15 minute checks             -- Vital signs:  q12 hours             -- Precautions: suicide, elopement, and assault   2. Psychiatric Diagnoses and Treatment:    -Continue Seroquel XR 50 mg nightly.   -Continue Seroquel IR 50 mg nightly as needed-for insomnia and treatment of mood disorder.  Patient reports Seroquel worked well in the past for mood disorder and insomnia. -Continue fluoxetine 20 mg once daily for MDD and GAD -Continue gabapentin 100 mg 3 times daily -alcohol use disorder and anxiety in context of substance use disorder.   -Continue CIWA with scheduled Ativan taper for alcohol use disorder and alcohol withdrawal   -Continue Haldol oral and intramuscular PRN for agitation protocol   --  The risks/benefits/side-effects/alternatives to this medication were discussed in detail with the patient and time was given for questions. The patient consents to medication trial.                -- Metabolic profile and EKG monitoring obtained while on an atypical antipsychotic (BMI:  Lipid Panel: HbgA1c: QTc:)              -- Encouraged patient to participate in unit milieu and in scheduled group therapies              -- Short Term Goals: Ability to identify changes in lifestyle to reduce recurrence of condition will improve, Ability to verbalize feelings will improve, Ability to disclose and discuss suicidal ideas, Ability to demonstrate self-control will improve, Ability to identify and develop effective coping behaviors will improve, Ability to maintain clinical measurements within normal limits will improve, Compliance with prescribed medications will improve, and Ability to identify triggers associated with substance abuse/mental health issues will improve             -- Long Term Goals: Improvement in symptoms so as ready for discharge                3. Medical Issues Being Addressed:              Tobacco Use Disorder             -- Nicotine patch 14 mg/24 hours  ordered             -- Smoking cessation encouraged.    4. Discharge Planning:              -- Social work and case management to assist with discharge planning and identification of hospital follow-up needs prior to discharge             -- Estimated LOS: 5-7 days             -- Discharge Concerns: Need to establish a safety plan; Medication compliance and effectiveness             -- Discharge Goals: Return home with outpatient referrals for mental health follow-up including medication management/psychotherapy   Lindell Spar, NP, pmhnp, fnp-bc. 12/26/2022, 5:19 PMPatient ID: Chad Sanchez, male   DOB: November 29, 1970, 52 y.o.   MRN: [id number]

## 2022-12-26 NOTE — Progress Notes (Signed)
72 hour form signed 12/26/22 at 1158

## 2022-12-26 NOTE — Group Note (Signed)
[facility] LCSW Group Therapy Note   Group Date: 12/26/2022 Start Time: 1100 End Time: 1200   Type of Therapy/Topic:  Group Therapy:  Balance in Life  Participation Level:  None   Description of Group:    This group will address the concept of balance and how it feels and looks when one is unbalanced. Patients will be encouraged to process areas in their lives that are out of balance, and identify reasons for remaining unbalanced. Facilitators will guide patients utilizing problem- solving interventions to address and correct the stressor making their life unbalanced. Understanding and applying boundaries will be explored and addressed for obtaining  and maintaining a balanced life. Patients will be encouraged to explore ways to assertively make their unbalanced needs known to significant others in their lives, using other group members and facilitator for support and feedback.  Therapeutic Goals: Patient will identify two or more emotions or situations they have that consume much of in their lives. Patient will identify signs/triggers that life has become out of balance:  Patient will identify two ways to set boundaries in order to achieve balance in their lives:  Patient will demonstrate ability to communicate their needs through discussion and/or role plays  Summary of Patient Progress:    Pt did not engage    Therapeutic Modalities:   Cognitive Behavioral Therapy Solution-Focused Therapy Assertiveness Training   [place], LCSW

## 2022-12-26 NOTE — Progress Notes (Signed)
   12/26/22 0820  Psych Admission Type (Psych Patients Only)  Admission Status Voluntary  Psychosocial Assessment  Patient Complaints Substance abuse  Eye Contact Brief  Facial Expression Sad;Anxious  Affect Irritable  Speech Logical/coherent  Interaction Assertive  Motor Activity Other (Comment) (WNL)  Appearance/Hygiene Unremarkable  Behavior Characteristics Appropriate to situation  Mood Irritable  Thought Process  Coherency WDL  Content WDL  Delusions None reported or observed  Perception WDL  Hallucination None reported or observed  Judgment Poor  Confusion None  Danger to Self  Current suicidal ideation? Denies  Agreement Not to Harm Self Yes  Description of Agreement verbal  Danger to Others  Danger to Others None reported or observed   Patient has become more irritable as the day has progressed. He continues to not want to attend groups but is eventually compliant due to being explained that it is one check mark to being discharged.Patient signed 72 hour request, MD notified

## 2022-12-26 NOTE — Group Note (Signed)
Occupational Therapy Group Note  Group Topic:Coping Skills  Group Date: 12/26/2022 Start Time: 1430 End Time: 1500 Facilitators: Brantley Stage, OT   Group Description: Group encouraged increased engagement and participation through discussion and activity focused on "Coping Ahead." Patients were split up into teams and selected a card from a stack of positive coping strategies. Patients were instructed to act out/charade the coping skill for other peers to guess and receive points for their team. Discussion followed with a focus on identifying additional positive coping strategies and patients shared how they were going to cope ahead over the weekend while continuing hospitalization stay.  Therapeutic Goal(s): Identify positive vs negative coping strategies. Identify coping skills to be used during hospitalization vs coping skills outside of hospital/at home Increase participation in therapeutic group environment and promote engagement in treatment   Participation Level: Active   Participation Quality: Independent   Behavior: Appropriate   Speech/Thought Process: Relevant   Affect/Mood: Appropriate   Insight: Fair   Judgement: Fair   Individualization: pt was engaged in their participation of group discussion/activity. New skills identified  Modes of Intervention: Education  Patient Response to Interventions:  Attentive   Plan: Continue to engage patient in OT groups 2 - 3x/week.  12/26/2022  Brantley Stage, OT Cornell Barman, OT

## 2022-12-26 NOTE — Progress Notes (Signed)
   12/26/22 2100  Psych Admission Type (Psych Patients Only)  Admission Status Voluntary  Psychosocial Assessment  Patient Complaints Substance abuse  Eye Contact Brief  Facial Expression Anxious  Affect Irritable  Speech Logical/coherent  Interaction Assertive  Motor Activity Slow  Appearance/Hygiene Unremarkable  Behavior Characteristics Cooperative  Mood Suspicious;Anxious;Labile  Aggressive Behavior  Effect No apparent injury  Thought Process  Coherency WDL  Content WDL  Delusions WDL  Perception WDL  Hallucination None reported or observed  Judgment Poor  Confusion None  Danger to Self  Current suicidal ideation? Denies  Danger to Others  Danger to Others None reported or observed

## 2022-12-26 NOTE — Progress Notes (Signed)
D: Pt  found in room, alert and oriented x 4, denies pain at this  time ,but complained of being tired and sleepy. Denied SI/HI/AVH Patient is cooperative, and interacts appropriately with staff and peers. Attended evening group.   A: RN provided support and encouragement to patient. Pt given scheduled medications as prescribed. Q15 min checks in progress.    R: Patient verbalized understanding of treatment plan, and contracts  for safety. Patient compliant with medications and treatment plan.

## 2022-12-26 NOTE — Progress Notes (Signed)
Adult Psychoeducational Group Note  Date:  12/26/2022 Time:  7:51 PM  Group Topic/Focus: Trivia  Participation Level:  Minimal  Participation Quality:  Resistant  Affect:  Angry  Cognitive:  Appropriate  Insight: None  Engagement in Group:  Defensive  Modes of Intervention:  Discussion  Additional Comments: Pt attended the trivia group and appeared defensive throughout the duration of the group.  Beryle Beams 12/26/2022, 7:51 PM

## 2022-12-26 NOTE — [facility] Group Notes (Signed)
Did not attend wrap-up group.

## 2022-12-26 NOTE — Plan of Care (Signed)
  Problem: Education: Goal: Knowledge of disease or condition will improve Outcome: Progressing    Problem: Physical Regulation: Goal: Complications related to the disease process, condition or treatment will be avoided or minimized Outcome: Progressing   Problem: Safety: Goal: Ability to remain free from injury will improve Outcome: Progressing   Problem: Education: Goal: Ability to make informed decisions regarding treatment will improve Outcome: Progressing   Problem: Coping: Goal: Coping ability will improve Outcome: Progressing   Problem: Medication: Goal: Compliance with prescribed medication regimen will improve Outcome: Progressing

## 2022-12-27 DIAGNOSIS — F332 Major depressive disorder, recurrent severe without psychotic features: Principal | ICD-10-CM

## 2022-12-27 MED ORDER — QUETIAPINE FUMARATE ER 50 MG PO TB24: 50.0000 mg | ORAL_TABLET | Freq: Every day | ORAL | 0 refills | Status: AC

## 2022-12-27 MED ORDER — VITAMIN B-1 100 MG PO TABS: 100.0000 mg | ORAL_TABLET | Freq: Every day | ORAL | Status: AC

## 2022-12-27 MED ORDER — NICOTINE 14 MG/24HR TD PT24: 14.0000 mg | MEDICATED_PATCH | Freq: Every day | TRANSDERMAL | 0 refills | Status: AC

## 2022-12-27 MED ORDER — FLUOXETINE HCL 20 MG PO CAPS: 20.0000 mg | ORAL_CAPSULE | Freq: Every day | ORAL | 0 refills | Status: AC

## 2022-12-27 MED ORDER — GABAPENTIN 100 MG PO CAPS: 100.0000 mg | ORAL_CAPSULE | Freq: Two times a day (BID) | ORAL | 0 refills | Status: AC

## 2022-12-27 NOTE — [facility] Suicide Risk Assessment (Signed)
Suicide Risk Assessment  Discharge Assessment    [facility] Discharge Suicide Risk Assessment   Principal Problem: MDD (major depressive disorder), recurrent severe, without psychosis ([place])  Discharge Diagnoses: Principal Problem:   MDD (major depressive disorder), recurrent severe, without psychosis ([place]) Active Problems:   Alcohol use disorder, severe, dependence ([place])  Total Time spent with patient:  Greater than 30 minutes  Musculoskeletal: Strength & Muscle Tone: within normal limits Gait & Station: normal Patient leans: N/A  Psychiatric Specialty Exam  Presentation  General Appearance:  Appropriate for Environment; Casual; Fairly Groomed  Eye Contact: Good  Speech: Clear and Coherent; Normal Rate  Speech Volume: Normal  Handedness: Right   Mood and Affect  Mood: Euthymic  Duration of Depression Symptoms: No data recorded Affect: Congruent  Thought Process  Thought Processes: Coherent; Goal Directed; Linear  Descriptions of Associations:Intact  Orientation:Full (Time, Place and Person)  Thought Content:Logical  History of Schizophrenia/Schizoaffective disorder:No data recorded Duration of Psychotic Symptoms:No data recorded Hallucinations:Hallucinations: None  Ideas of Reference:Other (comment)  Suicidal Thoughts:Suicidal Thoughts: No SI Active Intent and/or Plan: Without Intent; Without Plan; Without Means to Carry Out; Without Access to Means SI Passive Intent and/or Plan: Without Intent; Without Plan; Without Means to Carry Out; Without Access to Means  Homicidal Thoughts:Homicidal Thoughts: No  Sensorium  Memory: Immediate Good; Recent Good; Remote Good  Judgment: Good  Insight: Fair  Executive Functions  Concentration: Good  Attention Span: Good  Recall: Good  Fund of Knowledge: Good  Language: Good  Psychomotor Activity  Psychomotor Activity: Psychomotor Activity: Normal  Assets  Assets: Communication Skills;  Desire for Improvement; Physical Health; Resilience; Social Support  Sleep  Sleep: Sleep: Good Number of Hours of Sleep: 8  Physical Exam: See discharge summary.  Blood pressure 112/87, pulse (!) 108, temperature 98.6 F (37 C), temperature source Oral, resp. rate 16, height '6\' 2"'$  (1.88 m), weight 77.1 kg, SpO2 99 %. Body mass index is 21.83 kg/m.  Mental Status Per Nursing Assessment::   On Admission:  NA  Demographic Factors:  Male, Adolescent or young adult, Low socioeconomic status, and Unemployed  Loss Factors: Financial problems/change in socioeconomic status  Historical Factors: NA  Risk Reduction Factors:   Positive social support and Positive coping skills or problem solving skills  Continued Clinical Symptoms:  Depression:   Impulsivity Alcohol/Substance Abuse/Dependencies More than one psychiatric diagnosis Previous Psychiatric Diagnoses and Treatments  Cognitive Features That Contribute To Risk:  Closed-mindedness, Polarized thinking, and Thought constriction (tunnel vision)    Suicide Risk:  Minimal: No identifiable suicidal ideation.  Patients presenting with no risk factors but with morbid ruminations; may be classified as minimal risk based on the severity of the depressive symptoms  Plan Of Care/Follow-up recommendations:  Other:  Patient is instructed to follow-up with his primary care provider for all his medical care needs. Activity:  As tolerated Diet: As recommended by your primary care doctor. Keep all scheduled follow-up appointments as recommended.  Lindell Spar, NP, pmhnp, fnp-bc. 12/27/2022, 10:04 AM

## 2022-12-27 NOTE — Progress Notes (Signed)
Patient discharged from [facility] on 12/27/22 at 11:30 am. Patient denies SI, plan, and intention. Suicide safety plan completed, reviewed with this RN, given to the patient, and a copy in the chart. Patient denies HI/AVH upon discharge.  Patient is alert, oriented, and cooperative. RN provided patient with discharge paperwork and reviewed information with patient. Patient expressed that he understood all of the discharge instructions. Pt was satisfied with belongings returned to him from the locker and at bedside. Discharged patient to [facility] waiting room with bus pass.

## 2022-12-27 NOTE — Group Note (Signed)
Date:  12/27/2022 Time:  10:00 AM  Group Topic/Focus:  Orientation:   The focus of this group is to educate the patient on the purpose and policies of crisis stabilization and provide a format to answer questions about their admission.  The group details unit policies and expectations of patients while admitted.    Participation Level:  Did Not Attend  Participation Quality:      Affect:      Cognitive:      Insight: None  Engagement in Group:      Modes of Intervention:      Additional Comments:     Jerrye Beavers 12/27/2022, 10:00 AM

## 2022-12-27 NOTE — Discharge Instructions (Signed)
-  Follow-up with your outpatient psychiatric provider -instructions on appointment date, time, and address (location) are provided to you in discharge paperwork.  -Take your psychiatric medications as prescribed at discharge - instructions are provided to you in the discharge paperwork  -Follow-up with outpatient primary care doctor and other specialists -for management of preventative medicine and any chronic medical disease.  -Recommend abstinence from alcohol, tobacco, and other illicit drug use at discharge.   -If your psychiatric symptoms recur, worsen, or if you have side effects to your psychiatric medications, call your outpatient psychiatric provider, 911, [phone] or go to the nearest emergency department.  -If suicidal thoughts occur, call your outpatient psychiatric provider, 911, [phone] or go to the nearest emergency department.  Naloxone (Narcan) can help reverse an overdose when given to the victim quickly.  [place] offers free naloxone kits and instructions/training on its use.  Add naloxone to your first aid kit and you can help save a life.   Pick up your free kit at the following locations:   [place]:  [facility], [address] [place] [address] ([phone]) [facility] [address] [place] [id number] ([phone]) [facility] [address] [place] [address]  [place]: [facility] [address] [place] [address] ([phone]) [facility] [address] [place] [address] ([phone])  

## 2022-12-27 NOTE — Progress Notes (Signed)
  [facility] Adult Case Management Discharge Plan :  Will you be returning to the same living situation after discharge:  Yes,  Patient said that he is returning back to [place], [place] and just needs a PART bus pass At discharge, do you have transportation home?: Yes,  [name] provide patient with a PART bus pass Do you have the ability to pay for your medications: Yes,  Patient has [employer] Medicaid   Release of information consent forms completed and in the chart;  Patient's signature needed at discharge.  Patient to Follow up at: PATIENT DECLINED ALL FOLLOW UP APPOINTMENTS , INFORMED [name] THAT HE KNEW WHERE HE WAS GOING ONCE HE GOT TO [place],  AND DID NOT WANT [name] TO WASTE TIME WITH REACHING OUT.    Next level of care provider has access to [place] and Suicide Prevention discussed: No.Patient declined for [name] to speak with anyone     Has patient been referred to the Quitline?: Patient refused referral  Patient has been referred for addiction treatment: N/A  Sherre Lain, LCSWA 12/27/2022, 11:03 AM

## 2022-12-27 NOTE — Discharge Summary (Signed)
Physician Discharge Summary Note  Patient:  Chad Sanchez is an 52 y.o., male MRN:  [id number] DOB:  07-Apr-1971 Patient phone:  [phone] (home)  Patient address:   [place] [address],  Total Time spent with patient:  Greater than 30 minutes  Date of Admission:  12/24/2022  Date of Discharge: 12-27-22  Reason for Admission: Worsening depression, suicidal thoughts & alcohol/cocaine intoxications.   Principal Problem: MDD (major depressive disorder), recurrent severe, without psychosis ([place]) Discharge Diagnoses: Principal Problem:   MDD (major depressive disorder), recurrent severe, without psychosis ([place]) Active Problems:   Alcohol use disorder, severe, dependence ([place])  Past Psychiatric History: MDD, alcohol use disorder, stimulant use disorder.  Past Medical History:  Past Medical History:  Diagnosis Date   Alcohol abuse    Anxiety    Depression    Hypertension    Sickle cell anemia (HCC)     Past Surgical History:  Procedure Laterality Date   TONSILLECTOMY     Family History:  Family History  Problem Relation Age of Onset   Cirrhosis Mother    Family Psychiatric  History: See H&P.  Social History:  Social History   Substance and Sexual Activity  Alcohol Use Yes   Alcohol/week: 12.0 standard drinks of alcohol   Types: 12 Cans of beer per week   Comment: a case a day; pt told this Probation officer 12 pk daily or a fifth of vodka     Social History   Substance and Sexual Activity  Drug Use Yes   Types: "Crack" cocaine, Cocaine   Comment: last used 2 weeks ago    Social History   Socioeconomic History   Marital status: Single    Spouse name: Not on file   Number of children: Not on file   Years of education: Not on file   Highest education level: Not on file  Occupational History   Not on file  Tobacco Use   Smoking status: Every Day    Packs/day: 0.50    Types: Cigarettes   Smokeless tobacco: Never  Vaping Use   Vaping Use:  Never used  Substance and Sexual Activity   Alcohol use: Yes    Alcohol/week: 12.0 standard drinks of alcohol    Types: 12 Cans of beer per week    Comment: a case a day; pt told this writer 12 pk daily or a fifth of vodka   Drug use: Yes    Types: "Crack" cocaine, Cocaine    Comment: last used 2 weeks ago   Sexual activity: Not Currently  Other Topics Concern   Not on file  Social History Narrative   Not on file   Social Determinants of Health   Financial Resource Strain: Not on file  Food Insecurity: Food Insecurity Present (12/24/2022)   Hunger Vital Sign    Worried About Running Out of Food in the Last Year: Sometimes true    Ran Out of Food in the Last Year: Sometimes true  Transportation Needs: No Transportation Needs (12/24/2022)   PRAPARE - Hydrologist (Medical): No    Lack of Transportation (Non-Medical): No  Physical Activity: Not on file  Stress: Not on file  Social Connections: Not on file   Hospital Course: (Per admission evaluation notes): 52 year old male with a reported psychiatric history of bipolar disorder and anxiety disorder, who was admitted to the psychiatric hospital for worsening depression, suicidal thoughts, and alcohol detox. Prior to admission psychiatric medications: Patient  reports not taking psychiatric medications for quite some time. Patient reports worsening depression for some time in the context of interpersonal instability and relationships, unstable housing, financial struggles, and substance use.   This is one of several psychiatric discharge summaries from [facility] for this 52 year old AA male with hx of chronic mental illness, Alcohol/cocaine use disorder & multiple psychiatric admissions. He is known in this [facility] for worsening symptoms of  depression. Chad Sanchez has been tried on multiple psychotropic medications for his symptoms & it appeared his symptoms has not been able to improve & yet, he is known to be non-compliant to  his treatment regimen. Then the issues with homelessness & polysubstance abuse continue to be a factor against his mood remaining stable. He was brought to the [facility] this time around for evaluation & treatment for worsening depression, suicidal thoughts & alcohol/cocaine  intoxications.   After evaluation of his presenting symptoms, Chad Sanchez was recommended for mood stabilization/detoxification treatments. The medication regimen for his presenting symptoms were discussed & with his consent initiated. He received, stabilized & was discharged on the medications as listed below on his discharge medication lists. He was also enrolled, but seldom participated in the group counseling sessions being offered & held on this unit. Patient did get upset & defensive when encouraged to attend group sessions. He spent most of his time in bed warranting the need to lock his room during group sessions & meal time. He presented on this admission no other chronic medical conditions that required treatment & monitoring. He tolerated his treatment regimen without any adverse effects or reactions reported.  Chad Sanchez's symptoms responded well to his treatment regimen warranting this discharge. He is currently mentally & medically stable warranting this discharge. He currently denies any substance withdrawal symptoms. During the course of this hospitalization, the 15-minute checks were adequate to ensure Chad Sanchez's safety.  Patient did not display any dangerous, violent or suicidal behavior on the unit. He seldom participated in the group sessions/therapies. His medications were addressed & adjusted to meet his needs. He was recommended for outpatient follow-up care & medication management upon discharge to assure his continuity of care.  At the time of discharge, patient is not reporting any acute suicidal/homicidal ideations. He currently denies any new issues or concerns. Education and supportive counseling provided throughout her hospital  stay & upon discharge.  Today upon his discharge evaluation with his treatment team, Chad Sanchez shares he is doing well. He denies any other specific concerns. He is sleeping well. His appetite is good. He denies other physical complaints. He denies AH/VH. He feels that his medications have been helpful & is in agreement to continue his current treatment regimen as recommended. He was able to engage in safety planning including plan to return to [facility] or contact emergency services if he feels unable to maintain his own safety or the safety of others. Pt had no further questions, comments, or concerns. He left [facility] with all personal belongings in no apparent distress. Transportation per the bus services. [place] assisted with bus fare.  Physical Findings: AIMS:  , ,  ,  ,    CIWA:  CIWA-Ar Total: 3 COWS:     Musculoskeletal: Strength & Muscle Tone: within normal limits Gait & Station: normal Patient leans: N/A   Psychiatric Specialty Exam:  Presentation  General Appearance:  Appropriate for Environment; Casual; Fairly Groomed  Eye Contact: Good  Speech: Clear and Coherent; Normal Rate  Speech Volume: Normal  Handedness: Right   Mood and  Affect  Mood: Euthymic  Affect: Congruent   Thought Process  Thought Processes: Coherent; Goal Directed; Linear  Descriptions of Associations:Intact  Orientation:Full (Time, Place and Person)  Thought Content:Logical  History of Schizophrenia/Schizoaffective disorder:No data recorded  Duration of Psychotic Symptoms:No data recorded  Hallucinations:Hallucinations: None  Ideas of Reference:Other (comment)  Suicidal Thoughts:Suicidal Thoughts: No SI Active Intent and/or Plan: Without Intent; Without Plan; Without Means to Carry Out; Without Access to Means SI Passive Intent and/or Plan: Without Intent; Without Plan; Without Means to Carry Out; Without Access to Means  Homicidal Thoughts:Homicidal Thoughts: No  Sensorium   Memory: Immediate Good; Recent Good; Remote Good  Judgment: Good  Insight: Fair  Executive Functions  Concentration: Good  Attention Span: Good  Recall: Good  Fund of Knowledge: Good  Language: Good  Psychomotor Activity  Psychomotor Activity: Psychomotor Activity: Normal  Assets  Assets: Communication Skills; Desire for Improvement; Physical Health; Resilience; Social Support  Sleep  Sleep: Sleep: Good Number of Hours of Sleep: 8  Physical Exam: Physical Exam Vitals and nursing note reviewed.  HENT:     Nose: Nose normal.     Mouth/Throat:     Pharynx: Oropharynx is clear.  Eyes:     Pupils: Pupils are equal, round, and reactive to light.  Cardiovascular:     Rate and Rhythm: Normal rate.     Heart sounds: No murmur heard. Pulmonary:     Effort: Pulmonary effort is normal.  Genitourinary:    Comments: Deferred Musculoskeletal:        General: Normal range of motion.     Cervical back: Normal range of motion.  Skin:    General: Skin is warm and dry.  Neurological:     General: No focal deficit present.     Mental Status: He is alert and oriented to person, place, and time. Mental status is at baseline.    Review of Systems  Constitutional:  Negative for chills, diaphoresis and fever.  HENT:  Negative for congestion and sore throat.   Eyes:  Negative for blurred vision.  Respiratory:  Negative for cough, shortness of breath and wheezing.   Cardiovascular:  Negative for chest pain and palpitations.  Gastrointestinal:  Negative for abdominal pain, constipation, diarrhea, heartburn, nausea and vomiting.  Genitourinary:  Negative for dysuria.  Musculoskeletal:  Negative for joint pain and myalgias.  Skin:  Negative for itching and rash.  Neurological:  Negative for dizziness, tingling, tremors, sensory change, speech change, focal weakness, seizures, weakness and headaches.  Endo/Heme/Allergies:        Food allergy: Shellfish   Psychiatric/Behavioral:  Positive for depression (Hx of (stable on medication).) and substance abuse (Hx. alcohol & cocaine use disorder.). Negative for hallucinations, memory loss and suicidal ideas. The patient is not nervous/anxious (Stable upon discharge.) and does not have insomnia.    Blood pressure 112/87, pulse (!) 108, temperature 98.6 F (37 C), temperature source Oral, resp. rate 16, height '6\' 2"'$  (1.88 m), weight 77.1 kg, SpO2 99 %. Body mass index is 21.83 kg/m.  Social History   Tobacco Use  Smoking Status Every Day   Packs/day: 0.50   Types: Cigarettes  Smokeless Tobacco Never   Tobacco Cessation:  A prescription for an FDA-approved tobacco cessation medication provided at discharge  Blood Alcohol level:  Lab Results  Component Value Date   ETH <10 12/23/2022   ETH 320 ([place]) 0000000   Metabolic Disorder Labs:  No results found for: "HGBA1C", "MPG" No results found for: "PROLACTIN" No  results found for: "CHOL", "TRIG", "HDL", "CHOLHDL", "VLDL", "LDLCALC"  See Psychiatric Specialty Exam and Suicide Risk Assessment completed by Attending Physician prior to discharge.  Discharge destination:  Home  Is patient on multiple antipsychotic therapies at discharge:  No   Has Patient had three or more failed trials of antipsychotic monotherapy by history:  No  Recommended Plan for Multiple Antipsychotic Therapies: NA  Discharge Instructions     Diet - low sodium heart healthy   Complete by: As directed    Increase activity slowly   Complete by: As directed       Allergies as of 12/27/2022       Reactions   Fish Allergy Anaphylaxis   Shellfish Allergy Anaphylaxis        Medication List     STOP taking these medications    chlordiazePOXIDE 25 MG capsule Commonly known as: LIBRIUM   sertraline 100 MG tablet Commonly known as: ZOLOFT   traZODone 100 MG tablet Commonly known as: DESYREL       TAKE these medications      Indication  FLUoxetine  20 MG capsule Commonly known as: PROZAC Take 1 capsule (20 mg total) by mouth daily.  Indication: Excessive Use of Alcohol, Depression   gabapentin 100 MG capsule Commonly known as: NEURONTIN Take 1 capsule (100 mg total) by mouth 2 (two) times daily for 14 days. What changed: how much to take  Indication: Agitation, Alcohol Withdrawal Syndrome   multivitamin with minerals Tabs tablet Take 1 tablet by mouth daily.  Indication: Vitamin Supplementation   nicotine 14 mg/24hr patch Commonly known as: NICODERM CQ - dosed in mg/24 hours Place 1 patch (14 mg total) onto the skin daily. Start taking on: December 28, 2022  Indication: Nicotine Addiction   QUEtiapine 50 MG Tb24 24 hr tablet Commonly known as: SEROQUEL XR Take 1 tablet (50 mg total) by mouth at bedtime for 14 days.  Indication: Major Depressive Disorder   thiamine 100 MG tablet Commonly known as: Vitamin B-1 Take 1 tablet (100 mg total) by mouth daily. Start taking on: December 28, 2022  Indication: alcohol use       Follow-up recommendations: Activity:  As tolerated Diet: As recommended by your primary care doctor. Keep all scheduled follow-up appointments as recommended.   Comments: Comments: Patient is recommended to follow-up care on an outpatient basis as noted above. Prescriptions sent to pt's pharmacy of choice at discharge.   Patient agreeable to plan.   Given opportunity to ask questions.   Appears to feel comfortable with discharge denies any current suicidal or homicidal thought. Patient is also instructed prior to discharge to: Take all medications as prescribed by his/her mental healthcare provider. Report any adverse effects and or reactions from the medicines to his/her outpatient provider promptly. Patient has been instructed & cautioned: To not engage in alcohol and or illegal drug use while on prescription medicines. In the event of worsening symptoms, patient is instructed to call the crisis  hotline, 911 and or go to the nearest ED for appropriate evaluation and treatment of symptoms. To follow-up with his/her primary care provider for your other medical issues, concerns and or health care needs.     Signed: Lindell Spar, NP, pmhnp, fnp-bc 12/27/2022, 9:58 AM

## 2022-12-28 ENCOUNTER — Other Ambulatory Visit: Payer: Self-pay

## 2022-12-28 ENCOUNTER — Inpatient Hospital Stay (HOSPITAL_COMMUNITY)
Admission: AD | Admit: 2022-12-28 | Discharge: 2023-01-01 | DRG: 885 | Disposition: A | Discharge status: Home / Self Care | Payer: 59 | Attending: Psychiatry | Admitting: Psychiatry

## 2022-12-28 ENCOUNTER — Ambulatory Visit (HOSPITAL_COMMUNITY)
Admission: EM | Admit: 2022-12-28 | Discharge: 2022-12-28 | Disposition: A | Discharge status: Home / Self Care | Payer: No Typology Code available for payment source | Attending: Urology | Admitting: Urology

## 2022-12-28 ENCOUNTER — Encounter (HOSPITAL_COMMUNITY): Payer: Self-pay

## 2022-12-28 ENCOUNTER — Emergency Department (EMERGENCY_DEPARTMENT_HOSPITAL)
Admission: EM | Admit: 2022-12-28 | Discharge: 2022-12-28 | Disposition: A | Discharge status: Other Acute Inpatient Hospital | Payer: 59 | Source: Home / Self Care | Attending: Emergency Medicine | Admitting: Emergency Medicine

## 2022-12-28 DIAGNOSIS — F411 Generalized anxiety disorder: Secondary | ICD-10-CM | POA: Diagnosis present

## 2022-12-28 DIAGNOSIS — F1999 Other psychoactive substance use, unspecified with unspecified psychoactive substance-induced disorder: Secondary | ICD-10-CM

## 2022-12-28 DIAGNOSIS — R45851 Suicidal ideations: Secondary | ICD-10-CM | POA: Insufficient documentation

## 2022-12-28 DIAGNOSIS — Z20822 Contact with and (suspected) exposure to covid-19: Secondary | ICD-10-CM | POA: Diagnosis present

## 2022-12-28 DIAGNOSIS — F1910 Other psychoactive substance abuse, uncomplicated: Secondary | ICD-10-CM

## 2022-12-28 DIAGNOSIS — I10 Essential (primary) hypertension: Secondary | ICD-10-CM | POA: Diagnosis present

## 2022-12-28 DIAGNOSIS — K30 Functional dyspepsia: Secondary | ICD-10-CM | POA: Diagnosis present

## 2022-12-28 DIAGNOSIS — F1020 Alcohol dependence, uncomplicated: Secondary | ICD-10-CM | POA: Diagnosis present

## 2022-12-28 DIAGNOSIS — F17210 Nicotine dependence, cigarettes, uncomplicated: Secondary | ICD-10-CM | POA: Diagnosis present

## 2022-12-28 DIAGNOSIS — G4700 Insomnia, unspecified: Secondary | ICD-10-CM | POA: Diagnosis present

## 2022-12-28 DIAGNOSIS — F1024 Alcohol dependence with alcohol-induced mood disorder: Secondary | ICD-10-CM | POA: Diagnosis present

## 2022-12-28 DIAGNOSIS — F1094 Alcohol use, unspecified with alcohol-induced mood disorder: Secondary | ICD-10-CM | POA: Diagnosis present

## 2022-12-28 DIAGNOSIS — F332 Major depressive disorder, recurrent severe without psychotic features: Principal | ICD-10-CM | POA: Diagnosis present

## 2022-12-28 DIAGNOSIS — F1010 Alcohol abuse, uncomplicated: Secondary | ICD-10-CM | POA: Insufficient documentation

## 2022-12-28 DIAGNOSIS — F329 Major depressive disorder, single episode, unspecified: Secondary | ICD-10-CM | POA: Diagnosis present

## 2022-12-28 DIAGNOSIS — F1410 Cocaine abuse, uncomplicated: Secondary | ICD-10-CM | POA: Diagnosis present

## 2022-12-28 DIAGNOSIS — Z79899 Other long term (current) drug therapy: Secondary | ICD-10-CM

## 2022-12-28 DIAGNOSIS — K5900 Constipation, unspecified: Secondary | ICD-10-CM | POA: Diagnosis present

## 2022-12-28 DIAGNOSIS — T1491XA Suicide attempt, initial encounter: Secondary | ICD-10-CM

## 2022-12-28 LAB — COMPREHENSIVE METABOLIC PANEL
ALT: 27 U/L (ref 0–44)
AST: 25 U/L (ref 15–41)
Albumin: 4.6 g/dL (ref 3.5–5.0)
Alkaline Phosphatase: 60 U/L (ref 38–126)
Anion gap: 13 (ref 5–15)
BUN: 9 mg/dL (ref 6–20)
CO2: 25 mmol/L (ref 22–32)
Calcium: 8.9 mg/dL (ref 8.9–10.3)
Chloride: 103 mmol/L (ref 98–111)
Creatinine, Ser: 0.93 mg/dL (ref 0.61–1.24)
GFR, Estimated: >60 mL/min (ref >60)
Glucose, Bld: 93 mg/dL (ref 70–99)
Potassium: 4.4 mmol/L (ref 3.5–5.1)
Sodium: 141 mmol/L (ref 135–145)
Total Bilirubin: 0.4 mg/dL (ref 0.3–1.2)
Total Protein: 8.2 g/dL — ABNORMAL HIGH (ref 6.5–8.1)

## 2022-12-28 LAB — RESP PANEL BY RT-PCR (RSV, FLU A&B, COVID)  RVPGX2
Influenza A by PCR: NEGATIVE
Influenza B by PCR: NEGATIVE
Resp Syncytial Virus by PCR: NEGATIVE
SARS Coronavirus 2 by RT PCR: NEGATIVE

## 2022-12-28 LAB — CBC WITH DIFFERENTIAL/PLATELET
Abs Immature Granulocytes: 0.03 10*3/uL (ref 0.00–0.07)
Basophils Absolute: 0 10*3/uL (ref 0.0–0.1)
Basophils Relative: 0 %
Eosinophils Absolute: 0 10*3/uL (ref 0.0–0.5)
Eosinophils Relative: 1 %
HCT: 44 % (ref 39.0–52.0)
Hemoglobin: 13.9 g/dL (ref 13.0–17.0)
Immature Granulocytes: 1 %
Lymphocytes Relative: 35 %
Lymphs Abs: 1.9 10*3/uL (ref 0.7–4.0)
MCH: 26 pg (ref 26.0–34.0)
MCHC: 31.6 g/dL (ref 30.0–36.0)
MCV: 82.4 fL (ref 80.0–100.0)
Monocytes Absolute: 0.4 10*3/uL (ref 0.1–1.0)
Monocytes Relative: 8 %
Neutro Abs: 3.1 10*3/uL (ref 1.7–7.7)
Neutrophils Relative %: 55 %
Platelets: 263 10*3/uL (ref 150–400)
RBC: 5.34 MIL/uL (ref 4.22–5.81)
RDW: 14.2 % (ref 11.5–15.5)
WBC: 5.5 10*3/uL (ref 4.0–10.5)
nRBC: 0 % (ref 0.0–0.2)

## 2022-12-28 LAB — RAPID URINE DRUG SCREEN, HOSP PERFORMED
Amphetamines: NOT DETECTED
Barbiturates: NOT DETECTED
Benzodiazepines: NOT DETECTED
Cocaine: POSITIVE — AB
Opiates: NOT DETECTED
Tetrahydrocannabinol: NOT DETECTED

## 2022-12-28 LAB — ACETAMINOPHEN LEVEL: Acetaminophen (Tylenol), Serum: <10 ug/mL — ABNORMAL LOW (ref 10–30)

## 2022-12-28 LAB — ETHANOL: Alcohol, Ethyl (B): 263 mg/dL — ABNORMAL HIGH (ref <10)

## 2022-12-28 LAB — LIPASE, BLOOD: Lipase: 46 U/L (ref 11–51)

## 2022-12-28 LAB — SALICYLATE LEVEL: Salicylate Lvl: <7 mg/dL — ABNORMAL LOW (ref 7.0–30.0)

## 2022-12-28 MED ORDER — QUETIAPINE FUMARATE ER 50 MG PO TB24: 50.0000 mg | ORAL_TABLET | Freq: Every day | ORAL | Status: DC

## 2022-12-28 MED ORDER — DIPHENHYDRAMINE HCL 25 MG PO CAPS: 50.0000 mg | ORAL_CAPSULE | Freq: Three times a day (TID) | ORAL | Status: DC | PRN

## 2022-12-28 MED ORDER — FOLIC ACID 1 MG PO TABS
1.0000 mg | ORAL_TABLET | Freq: Every day | ORAL | Status: DC
  Administered 2022-12-28: 1 mg via ORAL
  Filled 2022-12-28: qty 1

## 2022-12-28 MED ORDER — HALOPERIDOL LACTATE 5 MG/ML IJ SOLN: 5.0000 mg | Freq: Three times a day (TID) | INTRAMUSCULAR | Status: DC | PRN

## 2022-12-28 MED ORDER — FLUOXETINE HCL 20 MG PO CAPS: 20.0000 mg | ORAL_CAPSULE | Freq: Every day | ORAL | Status: DC

## 2022-12-28 MED ORDER — ADULT MULTIVITAMIN W/MINERALS CH: 1.0000 | ORAL_TABLET | Freq: Every day | ORAL | Status: DC

## 2022-12-28 MED ORDER — LORAZEPAM 1 MG PO TABS: 2.0000 mg | ORAL_TABLET | Freq: Three times a day (TID) | ORAL | Status: DC | PRN

## 2022-12-28 MED ORDER — TRAZODONE HCL 50 MG PO TABS: 50.0000 mg | ORAL_TABLET | Freq: Every evening | ORAL | Status: DC | PRN

## 2022-12-28 MED ORDER — ACETAMINOPHEN 325 MG PO TABS: 650.0000 mg | ORAL_TABLET | Freq: Four times a day (QID) | ORAL | Status: DC | PRN

## 2022-12-28 MED ORDER — LORAZEPAM 1 MG PO TABS
1.0000 mg | ORAL_TABLET | ORAL | Status: DC | PRN
  Administered 2022-12-28 (×3): 1 mg via ORAL
  Filled 2022-12-28 (×4): qty 1

## 2022-12-28 MED ORDER — MAGNESIUM HYDROXIDE 400 MG/5ML PO SUSP: 30.0000 mL | Freq: Every day | ORAL | Status: DC | PRN

## 2022-12-28 MED ORDER — HYDROXYZINE HCL 25 MG PO TABS
25.0000 mg | ORAL_TABLET | Freq: Three times a day (TID) | ORAL | Status: DC | PRN
  Administered 2022-12-29 – 2022-12-31 (×6): 25 mg via ORAL
  Filled 2022-12-28 (×6): qty 1

## 2022-12-28 MED ORDER — GABAPENTIN 100 MG PO CAPS: 100.0000 mg | ORAL_CAPSULE | Freq: Two times a day (BID) | ORAL | Status: DC

## 2022-12-28 MED ORDER — FLUOXETINE HCL 20 MG PO CAPS
20.0000 mg | ORAL_CAPSULE | Freq: Every day | ORAL | Status: DC
  Administered 2022-12-28: 20 mg via ORAL
  Filled 2022-12-28: qty 1

## 2022-12-28 MED ORDER — HALOPERIDOL 5 MG PO TABS: 5.0000 mg | ORAL_TABLET | Freq: Three times a day (TID) | ORAL | Status: DC | PRN

## 2022-12-28 MED ORDER — DIPHENHYDRAMINE HCL 50 MG/ML IJ SOLN: 50.0000 mg | Freq: Three times a day (TID) | INTRAMUSCULAR | Status: DC | PRN

## 2022-12-28 MED ORDER — LORAZEPAM 2 MG/ML IJ SOLN: 1.0000 mg | INTRAMUSCULAR | Status: DC | PRN

## 2022-12-28 MED ORDER — THIAMINE HCL 100 MG/ML IJ SOLN
100.0000 mg | Freq: Every day | INTRAMUSCULAR | Status: DC
  Filled 2022-12-28: qty 2

## 2022-12-28 MED ORDER — QUETIAPINE FUMARATE ER 50 MG PO TB24
50.0000 mg | ORAL_TABLET | Freq: Every day | ORAL | Status: DC
  Administered 2022-12-28: 50 mg via ORAL
  Filled 2022-12-28: qty 1

## 2022-12-28 MED ORDER — ADULT MULTIVITAMIN W/MINERALS CH
1.0000 | ORAL_TABLET | Freq: Every day | ORAL | Status: DC
  Administered 2022-12-28: 1 via ORAL
  Filled 2022-12-28: qty 1

## 2022-12-28 MED ORDER — TRAZODONE HCL 50 MG PO TABS
50.0000 mg | ORAL_TABLET | Freq: Every evening | ORAL | Status: DC | PRN
  Administered 2022-12-29 – 2022-12-31 (×3): 50 mg via ORAL
  Filled 2022-12-28 (×3): qty 1

## 2022-12-28 MED ORDER — LORAZEPAM 2 MG/ML IJ SOLN: 2.0000 mg | Freq: Three times a day (TID) | INTRAMUSCULAR | Status: DC | PRN

## 2022-12-28 MED ORDER — ALUM & MAG HYDROXIDE-SIMETH 200-200-20 MG/5ML PO SUSP: 30.0000 mL | ORAL | Status: DC | PRN

## 2022-12-28 MED ORDER — VITAMIN B-1 100 MG PO TABS: 100.0000 mg | ORAL_TABLET | Freq: Every day | ORAL | Status: DC

## 2022-12-28 MED ORDER — ADULT MULTIVITAMIN W/MINERALS CH
1.0000 | ORAL_TABLET | Freq: Every day | ORAL | Status: DC
  Administered 2022-12-29 – 2022-12-31 (×3): 1 via ORAL
  Filled 2022-12-28 (×6): qty 1

## 2022-12-28 MED ORDER — LORAZEPAM 1 MG PO TABS: 1.0000 mg | ORAL_TABLET | ORAL | Status: DC | PRN

## 2022-12-28 MED ORDER — FLUOXETINE HCL 20 MG PO CAPS
20.0000 mg | ORAL_CAPSULE | Freq: Every day | ORAL | Status: DC
  Administered 2022-12-29 – 2022-12-31 (×3): 20 mg via ORAL
  Filled 2022-12-28 (×6): qty 1

## 2022-12-28 MED ORDER — GABAPENTIN 100 MG PO CAPS
100.0000 mg | ORAL_CAPSULE | Freq: Two times a day (BID) | ORAL | Status: DC
  Administered 2022-12-28 (×2): 100 mg via ORAL
  Filled 2022-12-28 (×2): qty 1

## 2022-12-28 MED ORDER — GABAPENTIN 100 MG PO CAPS
100.0000 mg | ORAL_CAPSULE | Freq: Two times a day (BID) | ORAL | Status: DC
  Administered 2022-12-29 – 2022-12-31 (×6): 100 mg via ORAL
  Filled 2022-12-28 (×10): qty 1

## 2022-12-28 MED ORDER — QUETIAPINE FUMARATE ER 50 MG PO TB24
50.0000 mg | ORAL_TABLET | Freq: Every day | ORAL | Status: DC
  Administered 2022-12-29: 50 mg via ORAL
  Filled 2022-12-28 (×4): qty 1

## 2022-12-28 MED ORDER — HYDROXYZINE HCL 25 MG PO TABS: 25.0000 mg | ORAL_TABLET | Freq: Three times a day (TID) | ORAL | Status: DC | PRN

## 2022-12-28 MED ORDER — THIAMINE HCL 100 MG/ML IJ SOLN: 100.0000 mg | Freq: Every day | INTRAMUSCULAR | Status: DC

## 2022-12-28 MED ORDER — THIAMINE MONONITRATE 100 MG PO TABS
100.0000 mg | ORAL_TABLET | Freq: Every day | ORAL | Status: DC
  Administered 2022-12-28: 100 mg via ORAL
  Filled 2022-12-28: qty 1

## 2022-12-28 NOTE — ED Provider Notes (Signed)
Behavioral Health Urgent Care Medical Screening Exam  Patient Name: Chad Sanchez MRN: [id number] Date of Evaluation: 12/28/22 Chief Complaint:   Diagnosis:  Final diagnoses:  Alcohol abuse    History of Present illness: Chad Sanchez is a 52 year old male with a psychiatric history significant for depression, cocaine use disorder, alcohol use disorder and suicidal ideation.  Patient was brought to [facility] voluntarily by law enforcement.  Patient presented with chief complaint of suicidal ideation.  Patient was evaluated face-to-face and his chart was reviewed by this nurse petitioner. Patient was admitted to [facility] from 12/24/2022 to 12/27/2022 (discharged less than 24 hrs ago) for mood stabilization and alcohol detox treatment. Per chart, patient "seldom participated in the group counseling sessions being offered & held on this unit. Patient did get upset & defensive when encouraged to attend group sessions." During discharged from [facility], he was offered outpatient resources but refused.  On evaluation, patient is alert and oriented x 4.  He is speaking in a clear tone of voice at a moderate rate with good eye contact.  Patient's mood is irritable with congruent affect.  Patient's thought process is coherent.  Patient did not appear to be responding to any internal/external stimuli or experiencing any delusional thought content during interaction.  Patient reports that he relapsed on alcohol and cocaine soon after discharging from [facility].  He reports using $20 worth of cocaine and drinking 24 cans of beer last night. He says he is experiencing suicidal ideation. He initially told TTS Counselor Erin Hearing that he had plan to jump off a bridge but denies suicidal plan and intent to this Probation officer. He says he wants to talk to someone and get assistance with substance abuse treatment. He denies homicidal ideation, hallucination, and paranoia.   Discussed patient outpatient resources with patient.  Patient is requesting to be admitted although he    [place] ED from 12/28/2022 in [facility] Admission (Discharged) from 12/24/2022 in [place] [facility] ED from 12/23/2022 in [facility] Emergency Department at [place] No Risk High Risk       Psychiatric Specialty Exam  Presentation  General Appearance:Appropriate for Environment; Casual; Fairly Groomed  Eye Contact:Good  Speech:Clear and Coherent; Normal Rate  Speech Volume:Normal  Handedness:Right   Mood and Affect  Mood: Euthymic  Affect: Congruent   Thought Process  Thought Processes: Coherent; Goal Directed; Linear  Descriptions of Associations:Intact  Orientation:Full (Time, Place and Person)  Thought Content:Logical  Diagnosis of Schizophrenia or Schizoaffective disorder in past: No   Hallucinations:None  Ideas of Reference:Other (comment)  Suicidal Thoughts:No Without Intent; Without Plan; Without Means to Carry Out; Without Access to Means Without Intent; Without Plan; Without Means to Carry Out; Without Access to Means  Homicidal Thoughts:No   Sensorium  Memory: Immediate Good; Recent Good; Remote Good  Judgment: Good  Insight: Fair   Executive Functions  Concentration: Good  Attention Span: Good  Recall: Good  Fund of Knowledge: Good  Language: Good   Psychomotor Activity  Psychomotor Activity: Normal   Assets  Assets: Communication Skills; Desire for Improvement; Physical Health; Resilience; Social Support   Sleep  Sleep: Good  Number of hours:  8   Physical Exam: Physical Exam Vitals and nursing note reviewed.  Constitutional:      General: He is not in acute distress.    Appearance: He is well-developed.  HENT:     Head: Normocephalic and atraumatic.  Eyes:     Conjunctiva/sclera: Conjunctivae normal.  Cardiovascular:     Rate and Rhythm:  Normal rate and regular rhythm.     Heart sounds: No murmur heard. Pulmonary:     Effort: Pulmonary effort is normal. No respiratory distress.     Breath sounds: Normal breath sounds.  Abdominal:     Palpations: Abdomen is soft.     Tenderness: There is no abdominal tenderness.  Musculoskeletal:        General: No swelling.     Cervical back: Neck supple.  Skin:    General: Skin is warm and dry.     Capillary Refill: Capillary refill takes less than 2 seconds.  Neurological:     Mental Status: He is alert.  Psychiatric:        Attention and Perception: Attention and perception normal.        Mood and Affect: Angry: irritable.        Speech: Speech normal.        Behavior: Behavior is agitated. Behavior is cooperative.        Thought Content: Thought content normal. Thought content is not paranoid or delusional. Thought content does not include homicidal or suicidal ideation. Thought content does not include homicidal or suicidal plan.        Cognition and Memory: Cognition normal.    Review of Systems  Constitutional: Negative.   HENT: Negative.    Eyes: Negative.   Respiratory: Negative.    Cardiovascular: Negative.   Gastrointestinal: Negative.   Genitourinary: Negative.   Musculoskeletal: Negative.   Skin: Negative.   Neurological: Negative.   Endo/Heme/Allergies: Negative.   Psychiatric/Behavioral:  The patient is nervous/anxious.    Blood pressure (!) 152/112, pulse 91, temperature 97.9 F (36.6 C), temperature source Oral, resp. rate 18, SpO2 99 %. There is no height or weight on file to calculate BMI.  Musculoskeletal: Strength & Muscle Tone: within normal limits Gait & Station: normal Patient leans: Right   [place] [facility] Discharge Disposition for Follow up and Recommendations: Based on my evaluation the patient does not appear to have an emergency medical condition and can be discharged with resources and follow up care in outpatient services for Medication  Management and Individual Therapy   Ophelia Shoulder, NP 12/28/2022, 6:28 AM

## 2022-12-28 NOTE — ED Notes (Signed)
Pt was escorted out by security due to pt being verbally aggressive and not wanting to answer any questions. He was loud and cursing. He was taken to lobby and shown where the pt telephone was at to make a phone call. Security stayed with him until he leave off premises

## 2022-12-28 NOTE — Progress Notes (Signed)
TOC consulted for SA resources. It appears pt is waiting to be seen by TTS. TOC will reengage if/when cleared.

## 2022-12-28 NOTE — Consult Note (Signed)
[place] ED ASSESSMENT   Reason for Consult:  Psych Consult Referring Physician:  Dallie Piles, PA-C Patient Identification: Chad Sanchez MRN:  [id number] ED Chief Complaint: Suicidal ideation  Diagnosis:  Principal Problem:   Suicidal ideation Active Problems:   Alcohol-induced mood disorder [facility])   ED Assessment Time Calculation: Start Time: 0945 Stop Time: 1025 Total Time in Minutes (Assessment Completion): 40   HPI:  Per Triage Note "Patient said he has been feeling suicidal for 3 days. Plan to jump in front of cars. Drinks alcohol every day, a case of beer. Last drink 2 hours ago to "keep from shaking." History of withdrawal seizures".    Subjective:  Chad Sanchez, 52 y.o., male patient seen face to face by this provider, consulted with Dr. Leverne Humbles; and chart reviewed on 12/28/22.  On evaluation Chad Sanchez reports that he is feeling suicidal with a plan to walk into traffic.  Patient was brought in by police department for stepping out in front of cars, patient states that he always had suicidal thoughts but this is his first attempt to actually commit suicide.  Patient states that he has been feeling depressed for a long time and does not know why, he knows that he primarily has to do with his drinking alcohol and he does not know why he cannot stop drinking alcohol.  Patient states that he was clean for a few years beginning in 2009, states that he has been drinking alcohol over the past 30 years due to different issues and depression that he has had in his life.  Patient also endorses that he uses cocaine, UDS positive for cocaine patient states that he uses it 3-4 times a month.  Patient states that he lives with his aunt and he works at [employer] and he has been there for 1-1/2 years.  Patient states his appetite and sleep are fair, states that he normally is able to fall asleep by drinking alcohol and if he does not drink alcohol he cannot sleep and if he drinks alcohol  he cannot eat because he drinks alcohol all day.  During evaluation Chad Sanchez is lying on a hospital gurney in no acute distress.  He is alert, oriented x 3, calm, cooperative and attentive.  His mood is depressed with congruent affect.  He has normal speech, and behavior.  Objectively there is no evidence of psychosis/mania or delusional thinking.  Patient is able to converse coherently,  no distractibility, or pre-occupation.  He denies homicidal ideation, psychosis, and paranoia.  Per chart review patient was just admitted and discharged from behavioral health Hospital in February 20-23 2024.  Patient states that he does not take medicine when he leaves the hospital, unsure why, says that he just needs help and he will start taking this medication if he is admitted to another facility.  Patient has a long history of alcohol use disorder, he states that he feels depressed about using alcohol but does not want to stop at this time.  Offered the patient an intensive outpatient treatment, he stated "outpatient does not work for me I tried a long time ago and I did not like it, I feel I just need to be admitted inpatient so did not hurt myself".  Patient placed on CIWA protocol.  EKG QT/QTcB 392/460   Past Psychiatric History: MDD recurrent severe without psychosis, alcohol use disorder, anxiety   Risk to Self or Others: Is the patient at risk to self? Yes Has the patient  been a risk to self in the past 6 months? Yes Has the patient been a risk to self within the distant past? No Is the patient a risk to others? No Has the patient been a risk to others in the past 6 months? No Has the patient been a risk to others within the distant past? No  [place] Scale:  [place] ED from 12/28/2022 in [facility] Emergency Department at [facility] Most recent reading at 12/28/2022  7:13 AM ED from 12/28/2022 in [facility] Most recent reading at 12/28/2022   5:04 AM Admission (Discharged) from 12/24/2022 in [facility] Most recent reading at 12/24/2022 12:14 PM  C-SSRS RISK CATEGORY High Risk Low Risk No Risk       AIMS:  , , ,  ,   ASAM:    Substance Abuse:     Past Medical History:  Past Medical History:  Diagnosis Date   Alcohol abuse    Anxiety    Depression    Hypertension    Sickle cell anemia ([place])     Past Surgical History:  Procedure Laterality Date   TONSILLECTOMY     Family History:  Family History  Problem Relation Age of Onset   Cirrhosis Mother      Social History:  Social History   Substance and Sexual Activity  Alcohol Use Yes   Alcohol/week: 12.0 standard drinks of alcohol   Types: 12 Cans of beer per week   Comment: a case a day; pt told this Probation officer 12 pk daily or a fifth of vodka     Social History   Substance and Sexual Activity  Drug Use Yes   Types: "Crack" cocaine, Cocaine   Comment: last used 2 weeks ago    Social History   Socioeconomic History   Marital status: Single    Spouse name: Not on file   Number of children: Not on file   Years of education: Not on file   Highest education level: Not on file  Occupational History   Not on file  Tobacco Use   Smoking status: Every Day    Packs/day: 0.50    Types: Cigarettes   Smokeless tobacco: Never  Vaping Use   Vaping Use: Never used  Substance and Sexual Activity   Alcohol use: Yes    Alcohol/week: 12.0 standard drinks of alcohol    Types: 12 Cans of beer per week    Comment: a case a day; pt told this writer 12 pk daily or a fifth of vodka   Drug use: Yes    Types: "Crack" cocaine, Cocaine    Comment: last used 2 weeks ago   Sexual activity: Not Currently  Other Topics Concern   Not on file  Social History Narrative   Not on file   Social Determinants of Health   Financial Resource Strain: Not on file  Food Insecurity: Food Insecurity Present (12/24/2022)   Hunger Vital Sign     Worried About Running Out of Food in the Last Year: Sometimes true    Ran Out of Food in the Last Year: Sometimes true  Transportation Needs: No Transportation Needs (12/24/2022)   PRAPARE - Hydrologist (Medical): No    Lack of Transportation (Non-Medical): No  Physical Activity: Not on file  Stress: Not on file  Social Connections: Not on file      Allergies:   Allergies  Allergen  Reactions   Fish Allergy Anaphylaxis   Shellfish Allergy Anaphylaxis    Labs:  Results for orders placed or performed during the hospital encounter of 12/28/22 (from the past 48 hour(s))  CBC with Differential     Status: None   Collection Time: 12/28/22  8:12 AM  Result Value Ref Range   WBC 5.5 4.0 - 10.5 K/uL   RBC 5.34 4.22 - 5.81 MIL/uL   Hemoglobin 13.9 13.0 - 17.0 g/dL   HCT 44.0 39.0 - 52.0 %   MCV 82.4 80.0 - 100.0 fL   MCH 26.0 26.0 - 34.0 pg   MCHC 31.6 30.0 - 36.0 g/dL   RDW 14.2 11.5 - 15.5 %   Platelets 263 150 - 400 K/uL   nRBC 0.0 0.0 - 0.2 %   Neutrophils Relative % 55 %   Neutro Abs 3.1 1.7 - 7.7 K/uL   Lymphocytes Relative 35 %   Lymphs Abs 1.9 0.7 - 4.0 K/uL   Monocytes Relative 8 %   Monocytes Absolute 0.4 0.1 - 1.0 K/uL   Eosinophils Relative 1 %   Eosinophils Absolute 0.0 0.0 - 0.5 K/uL   Basophils Relative 0 %   Basophils Absolute 0.0 0.0 - 0.1 K/uL   Immature Granulocytes 1 %   Abs Immature Granulocytes 0.03 0.00 - 0.07 K/uL    Comment: Performed at [facility], [place] [address], [place], [place] [address]  Comprehensive metabolic panel     Status: Abnormal   Collection Time: 12/28/22  8:12 AM  Result Value Ref Range   Sodium 141 135 - 145 mmol/L   Potassium 4.4 3.5 - 5.1 mmol/L   Chloride 103 98 - 111 mmol/L   CO2 25 22 - 32 mmol/L   Glucose, Bld 93 70 - 99 mg/dL    Comment: Glucose reference range applies only to samples taken after fasting for at least 8 hours.   BUN 9 6 - 20 mg/dL   Creatinine, Ser 0.93 0.61  - 1.24 mg/dL   Calcium 8.9 8.9 - 10.3 mg/dL   Total Protein 8.2 (H) 6.5 - 8.1 g/dL   Albumin 4.6 3.5 - 5.0 g/dL   AST 25 15 - 41 U/L   ALT 27 0 - 44 U/L   Alkaline Phosphatase 60 38 - 126 U/L   Total Bilirubin 0.4 0.3 - 1.2 mg/dL   GFR, Estimated >60 >60 mL/min    Comment: (NOTE) Calculated using the CKD-EPI Creatinine Equation (2021)    Anion gap 13 5 - 15    Comment: Performed at [facility], [place] [address] [place], [place] [address]  Ethanol     Status: Abnormal   Collection Time: 12/28/22  8:12 AM  Result Value Ref Range   Alcohol, Ethyl (B) 263 (H) <10 mg/dL    Comment: (NOTE) Lowest detectable limit for serum alcohol is 10 mg/dL.  For medical purposes only. Performed at [facility], [place] [address], [place], [place] [address]   Acetaminophen level     Status: Abnormal   Collection Time: 12/28/22  8:12 AM  Result Value Ref Range   Acetaminophen (Tylenol), Serum <10 (L) 10 - 30 ug/mL    Comment: (NOTE) Therapeutic concentrations vary significantly. A range of 10-30 ug/mL  may be an effective concentration for many patients. However, some  are best treated at concentrations outside of this range. Acetaminophen concentrations >150 ug/mL at 4 hours after ingestion  and >50 ug/mL at 12 hours after ingestion  are often associated with  toxic reactions.  Performed at [facility], [place] [address] [place], [place] [id number]   Salicylate level     Status: Abnormal   Collection Time: 12/28/22  8:12 AM  Result Value Ref Range   Salicylate Lvl Q000111Q (L) 7.0 - 30.0 mg/dL    Comment: Performed at [facility], [place] [address], [place], [place] [address]  Lipase, blood     Status: None   Collection Time: 12/28/22  8:12 AM  Result Value Ref Range   Lipase 46 11 - 51 U/L    Comment: Performed at [facility], [place] [address], [place], [place] [address]  Rapid urine drug screen (hospital  performed)     Status: Abnormal   Collection Time: 12/28/22 10:18 AM  Result Value Ref Range   Opiates NONE DETECTED NONE DETECTED   Cocaine POSITIVE (A) NONE DETECTED   Benzodiazepines NONE DETECTED NONE DETECTED   Amphetamines NONE DETECTED NONE DETECTED   Tetrahydrocannabinol NONE DETECTED NONE DETECTED   Barbiturates NONE DETECTED NONE DETECTED    Comment: (NOTE) DRUG SCREEN FOR MEDICAL PURPOSES ONLY.  IF CONFIRMATION IS NEEDED FOR ANY PURPOSE, NOTIFY LAB WITHIN 5 DAYS.  LOWEST DETECTABLE LIMITS FOR URINE DRUG SCREEN Drug Class                     Cutoff (ng/mL) Amphetamine and metabolites    1000 Barbiturate and metabolites    200 Benzodiazepine                 200 Opiates and metabolites        300 Cocaine and metabolites        300 THC                            50 Performed at [facility], [place] [address], [place], [place] [address]     Current Facility-Administered Medications  Medication Dose Route Frequency Provider Last Rate Last Admin   folic acid (FOLVITE) tablet 1 mg  1 mg Oral Daily Gowens, Mariah L, PA-C   1 mg at 12/28/22 0935   LORazepam (ATIVAN) tablet 1-4 mg  1-4 mg Oral Q1H PRN Gowens, Mariah L, PA-C   1 mg at 12/28/22 1452   Or   LORazepam (ATIVAN) injection 1-4 mg  1-4 mg Intravenous Q1H PRN Gowens, Mariah L, PA-C       multivitamin with minerals tablet 1 tablet  1 tablet Oral Daily Gowens, Mariah L, PA-C   1 tablet at 12/28/22 0935   thiamine (VITAMIN B1) tablet 100 mg  100 mg Oral Daily Gowens, Mariah L, PA-C   100 mg at 12/28/22 [id number]   Or   thiamine (VITAMIN B1) injection 100 mg  100 mg Intravenous Daily Gowens, Mariah L, PA-C       Current Outpatient Medications  Medication Sig Dispense Refill   FLUoxetine (PROZAC) 20 MG capsule Take 1 capsule (20 mg total) by mouth daily. 14 capsule 0   gabapentin (NEURONTIN) 100 MG capsule Take 1 capsule (100 mg total) by mouth 2 (two) times daily for 14 days. 28 capsule 0   QUEtiapine  (SEROQUEL XR) 50 MG TB24 24 hr tablet Take 1 tablet (50 mg total) by mouth at bedtime for 14 days. 14 tablet 0   thiamine (VITAMIN B-1) 100 MG tablet Take 1 tablet (100 mg total) by mouth daily.  Multiple Vitamin (MULTIVITAMIN WITH MINERALS) TABS tablet Take 1 tablet by mouth daily. (Patient not taking: Reported on 12/23/2022)     nicotine (NICODERM CQ - DOSED IN MG/24 HOURS) 14 mg/24hr patch Place 1 patch (14 mg total) onto the skin daily. (Patient not taking: Reported on 12/28/2022) 28 patch 0    Musculoskeletal:  Observe patient resting in bed   Psychiatric Specialty Exam: Presentation  General Appearance:  Appropriate for Environment  Eye Contact: Fair  Speech: Clear and Coherent  Speech Volume: Normal  Handedness: Right   Mood and Affect  Mood: Depressed  Affect: Appropriate   Thought Process  Thought Processes: Coherent  Descriptions of Associations:Intact  Orientation:Full (Time, Place and Person)  Thought Content:WDL  History of Schizophrenia/Schizoaffective disorder:No  Duration of Psychotic Symptoms:No data recorded Hallucinations:Hallucinations: None  Ideas of Reference:None  Suicidal Thoughts:Suicidal Thoughts: Yes, Active SI Active Intent and/or Plan: With Intent; With Plan SI Passive Intent and/or Plan: Without Intent; Without Plan; Without Means to Carry Out; Without Access to Means  Homicidal Thoughts:Homicidal Thoughts: No   Sensorium  Memory: Immediate Fair; Remote Good  Judgment: Poor  Insight: Lacking   Executive Functions  Concentration: Fair  Attention Span: Fair  Recall: [employer] of Knowledge: Fair  Language: Good   Psychomotor Activity  Psychomotor Activity: Psychomotor Activity: Normal   Assets  Assets: Communication Skills; Housing    Sleep  Sleep: Sleep: [place] Number of Hours of Sleep: 8   Physical Exam: Physical Exam Vitals and nursing note reviewed. Exam conducted with a  chaperone present.  HENT:     Mouth/Throat:     Pharynx: Oropharynx is clear.  Eyes:     Pupils: Pupils are equal, round, and reactive to light.  Cardiovascular:     Rate and Rhythm: Regular rhythm.  Musculoskeletal:        General: Normal range of motion.  Neurological:     Mental Status: He is alert.  Psychiatric:        Attention and Perception: Attention normal.        Mood and Affect: Mood is anxious and depressed.        Speech: Speech normal.        Behavior: Behavior is cooperative.        Thought Content: Thought content includes suicidal ideation. Thought content includes suicidal plan.        Cognition and Memory: Cognition is impaired.        Judgment: Judgment is inappropriate.    Review of Systems  Constitutional: Negative.   HENT: Negative.    Respiratory: Negative.    Musculoskeletal: Negative.   Psychiatric/Behavioral:  Positive for depression, substance abuse and suicidal ideas.    Blood pressure 129/75, pulse (!) 107, temperature 97.9 F (36.6 C), temperature source Oral, resp. rate 18, SpO2 97 %. There is no height or weight on file to calculate BMI.  Medical Decision Making: Patient  is suicidal and have a plan to walk into traffic.  We will seek inpatient Psychiatric hospitalization for treatment of depression and Detox treatment.  Will speak with TTS social work for bed placement and availability.  Patient is on CIWA protocol with Ativan coverage. Restart patient on home medications Prozac 20 mg daily, Neurontin 100 mg twice daily, Seroquel 50 mg at bedtime.  Problem 1: Alcohol use disorder, severe Dependence    Problem 2: Recurrent Major Depressive Disorder, recurrent Severe  without Psychotic features.    Disposition: Recommend psychiatric Inpatient admission when medically cleared.  Michaele Offer, PMHNP 12/28/2022 2:54 PM

## 2022-12-28 NOTE — Progress Notes (Addendum)
Addendum  At 6:00pm per [place], RN pt is under review for acceptance at [facility]. This [name]'s shift is ending and [place] will follow up with care team in reference to PENDING acceptance to [facility].   At 4:40pm [name] was contacted by provider Michaele Offer, PMHNP informing that pt meets criteria for inpatient behavioral health placement. Provider advises that pt recently was just discharged from [facility] yesterday. This [name] requested that [place], RN review for [facility] or [place]. [name] and Disposition team will assist and follow with placement.   Benjaman Kindler, MSW, LCSWA 12/28/2022 6:00 PM

## 2022-12-28 NOTE — ED Notes (Signed)
Safe transport notified to take pt to [facility]

## 2022-12-28 NOTE — ED Provider Notes (Signed)
[facility] Provider Note   CSN: [id number] Arrival date & time: 12/28/22  [id number]     History  Chief Complaint  Patient presents with   Suicidal    Chad Sanchez is a 52 y.o. male with past medical history depression, anxiety, hypertension, polysubstance abuse presents to the ED complaining of suicidal ideation.  Patient states that he has had SI for the last 2 to 3 days that has been increasing in intensity and this morning he was stepping out further cars and then attempt to end his life when PD intervened and brought him to the ED for further evaluation.  Patient recently admitted to psychiatric facility from 2/20 to 2/23 and discharged home yesterday.  He states he did not start any medications at the time of his discharge.  Reports that prior to this he had not been admitted for psychiatric or substance use complaints since 2012 when he completed an intensive 2-year program for his alcohol use disorder in [place].  He reports that after being discharged from psychiatric facility yesterday he went and drank alcohol heavily and used cocaine multiple times in attempt to cope with his suicidal ideation.  He denies homicidal/violent ideation or auditory/visual hallucinations.  He denies previous suicide attempts.  He states that he has radiating to get help for his mental health and substance abuse and would like to return to the psychiatric facility from when she was discharged yesterday.      Home Medications Prior to Admission medications   Medication Sig Start Date End Date Taking? Authorizing Provider  FLUoxetine (PROZAC) 20 MG capsule Take 1 capsule (20 mg total) by mouth daily. 12/27/22  Yes Massengill, Ovid Curd, MD  gabapentin (NEURONTIN) 100 MG capsule Take 1 capsule (100 mg total) by mouth 2 (two) times daily for 14 days. 12/27/22 01/10/23 Yes Massengill, Ovid Curd, MD  QUEtiapine (SEROQUEL XR) 50 MG TB24 24 hr tablet Take 1 tablet (50 mg  total) by mouth at bedtime for 14 days. 12/27/22 01/10/23 Yes Massengill, Ovid Curd, MD  thiamine (VITAMIN B-1) 100 MG tablet Take 1 tablet (100 mg total) by mouth daily. 12/28/22  Yes Massengill, Ovid Curd, MD  Multiple Vitamin (MULTIVITAMIN WITH MINERALS) TABS tablet Take 1 tablet by mouth daily. Patient not taking: Reported on 12/23/2022 05/18/16   Niel Hummer, NP  nicotine (NICODERM CQ - DOSED IN MG/24 HOURS) 14 mg/24hr patch Place 1 patch (14 mg total) onto the skin daily. Patient not taking: Reported on 12/28/2022 12/28/22   Janine Limbo, MD      Allergies    Fish allergy and Shellfish allergy    Review of Systems   Review of Systems  All other systems reviewed and are negative.   Physical Exam Updated Vital Signs BP 129/75 (BP Location: Left Arm)   Pulse (!) 107   Temp 98.7 F (37.1 C) (Oral)   Resp 19   SpO2 97%  Physical Exam Vitals and nursing note reviewed.  Constitutional:      General: He is not in acute distress.    Appearance: Normal appearance. He is not toxic-appearing.  HENT:     Head: Normocephalic and atraumatic.     Mouth/Throat:     Mouth: Mucous membranes are moist.  Eyes:     General: No scleral icterus.    Extraocular Movements: Extraocular movements intact.     Conjunctiva/sclera: Conjunctivae normal.     Pupils: Pupils are equal, round, and reactive to light.  Cardiovascular:  Rate and Rhythm: Normal rate and regular rhythm.     Heart sounds: No murmur heard. Pulmonary:     Effort: Pulmonary effort is normal. No respiratory distress.     Breath sounds: Normal breath sounds. No wheezing, rhonchi or rales.  Abdominal:     General: Abdomen is flat. There is no distension.     Palpations: Abdomen is soft. There is no shifting dullness, fluid wave or pulsatile mass.     Tenderness: There is abdominal tenderness (mild diffuse). There is no right CVA tenderness, left CVA tenderness, guarding or rebound. Negative signs include Murphy's sign, Rovsing's  sign and McBurney's sign.  Musculoskeletal:        General: Normal range of motion.     Cervical back: Normal range of motion and neck supple. No rigidity.     Right lower leg: No edema.     Left lower leg: No edema.  Skin:    General: Skin is warm and dry.     Capillary Refill: Capillary refill takes less than 2 seconds.     Coloration: Skin is not jaundiced or pale.  Neurological:     Mental Status: He is alert and oriented to person, place, and time.     GCS: GCS eye subscore is 4. GCS verbal subscore is 5. GCS motor subscore is 6.     Cranial Nerves: Cranial nerves 2-12 are intact.     Motor: Motor function is intact.     Coordination: Coordination is intact.  Psychiatric:        Attention and Perception: Attention normal. He does not perceive auditory or visual hallucinations.        Mood and Affect: Affect is blunt, flat and tearful.        Speech: Speech normal. He is communicative. Speech is not rapid and pressured, delayed, slurred or tangential.        Behavior: Behavior normal. Behavior is not agitated, slowed, aggressive, withdrawn, hyperactive or combative. Behavior is cooperative.        Thought Content: Thought content is not paranoid or delusional. Thought content includes suicidal ideation. Thought content does not include homicidal ideation. Thought content includes suicidal plan. Thought content does not include homicidal plan.        Judgment: Judgment is impulsive.     ED Results / Procedures / Treatments   Labs (all labs ordered are listed, but only abnormal results are displayed) Labs Reviewed  COMPREHENSIVE METABOLIC PANEL - Abnormal; Notable for the following components:      Result Value   Total Protein 8.2 (*)    All other components within normal limits  ETHANOL - Abnormal; Notable for the following components:   Alcohol, Ethyl (B) 263 (*)    All other components within normal limits  RAPID URINE DRUG SCREEN, HOSP PERFORMED - Abnormal; Notable for the  following components:   Cocaine POSITIVE (*)    All other components within normal limits  ACETAMINOPHEN LEVEL - Abnormal; Notable for the following components:   Acetaminophen (Tylenol), Serum <10 (*)    All other components within normal limits  SALICYLATE LEVEL - Abnormal; Notable for the following components:   Salicylate Lvl Q000111Q (*)    All other components within normal limits  CBC WITH DIFFERENTIAL/PLATELET  LIPASE, BLOOD    EKG EKG Interpretation  Date/Time:  Saturday December 28 2022 08:29:19 EST Ventricular Rate:  83 PR Interval:  152 QRS Duration: 88 QT Interval:  392 QTC Calculation: 460 R Axis:  59 Text Interpretation: Normal sinus rhythm Normal ECG When compared with ECG of 23-Dec-2022 19:24, PREVIOUS ECG IS PRESENT No significant change was found Confirmed by Ezequiel Essex [phone]) on 12/28/2022 8:53:59 AM  Radiology No results found.  Procedures Procedures    Medications Ordered in ED Medications  LORazepam (ATIVAN) tablet 1-4 mg (1 mg Oral Given 12/28/22 1452)    Or  LORazepam (ATIVAN) injection 1-4 mg ( Intravenous See Alternative 12/28/22 1452)  thiamine (VITAMIN B1) tablet 100 mg (100 mg Oral Given 12/28/22 0936)    Or  thiamine (VITAMIN B1) injection 100 mg ( Intravenous See Alternative Q000111Q [id number])  folic acid (FOLVITE) tablet 1 mg (1 mg Oral Given 12/28/22 0935)  multivitamin with minerals tablet 1 tablet (1 tablet Oral Given 12/28/22 0935)  FLUoxetine (PROZAC) capsule 20 mg (has no administration in time range)  gabapentin (NEURONTIN) capsule 100 mg (has no administration in time range)  QUEtiapine (SEROQUEL XR) 24 hr tablet 50 mg (has no administration in time range)    ED Course/ Medical Decision Making/ A&P                             Medical Decision Making Amount and/or Complexity of Data Reviewed Labs: ordered. Decision-making details documented in ED Course. ECG/medicine tests: ordered. Decision-making details documented in ED  Course.  Risk OTC drugs. Prescription drug management.   Medical Decision Making:   Chad Sanchez is a 52 y.o. male who presented to the ED today with suicidal ideation with plan as detailed above.    External chart has been reviewed including recent ED visit and evaluation by behavioral health. Patient's presentation is complicated by their history of alcohol use disorder, cocaine use disorder, multiple psychiatric comorbidities.  Complete initial physical exam performed, notably the patient was neurologically intact but was expressing suicidal ideation with plan.  He was cooperative and did not appear to be responding to internal stimuli.  He did have some mild diffuse abdominal tenderness but abdomen was soft without rigidity or peritoneal signs.  He reported a history of alcohol induced pancreatitis so lipase obtained for further assessment. Reviewed and confirmed nursing documentation for past medical history, family history, social history.    Initial Assessment:   With the patient's presentation of suicidal ideation, most likely diagnosis is decompensated mood disorder complicated by polysubstance abuse. Other diagnoses were considered including (but not limited to) bipolar disorder, schizophrenia, schizoaffective disorder, malingering, alcohol withdrawal, electrolyte disturbance, AKI, dehydration, acute pancreatitis. These are considered less likely due to history of present illness and physical exam findings.   This is most consistent with an acute complicated illness  Initial Plan:  Screening labs including CBC and Metabolic panel to evaluate for infectious or metabolic etiology of disease.  Lipase to evaluate for acute pancreatitis UDS Salicylate and acetaminophen levels to evaluate for drug overdose EKG to evaluate for cardiac pathology TTS consult Objective evaluation as reviewed   Initial Study Results:   Laboratory  All laboratory results reviewed without evidence  of clinically relevant pathology.   Exceptions include: alcohol 263   EKG EKG was reviewed independently. Rate, rhythm, axis, intervals all examined and without medically relevant abnormality. ST segments without concerns for elevations.    Final Assessment and Plan:   This is a 52 year old male presenting to the ED with suicidal ideation with plan.  Patient brought in to the ED by PD after attempting to step out into traffic.  He states  that he was attempting to end his life and has been feeling this way for the past 2 to 3 days.  Patient recently discharged from psychiatric facility yesterday.  He reports not starting any medications after this discharge.  He reports that he has had increasing suicidal ideation and after discharge from psychiatric facility he left and consumes large amounts of alcohol and cocaine in an attempt to cope with suicidal ideation.  On my exam, he is cooperative though tearful and expressing suicidal ideation.  He does not appear to be responding to internal stimuli.  He has mild diffuse abdominal tenderness but no peritoneal signs.  With reports of alcohol intoxication in the setting of alcohol use disorder, CIWA protocol ordered as well as medical clearance labs. With pt with active SI with plan, will plan to medically clear and consult TTS for recommendations. Pt expresses desire for psychiatric placement so will remain voluntary at this time and reassess need for IVC pending TTS consult, reassessments, changes in condition.   10:15am -- All workup back apart from UDS. Pt medically cleared at this time. Lipase normal without signs of acute pancreatitis. Remainder of workup unremarkable apart from elevated alcohol level of 263. Pt remains stable, cooperative, pending TTS recommendations.    10:45am -- UDS positive for cocaine. Pt remains stable and cooperative. Medically cleared for psychiatric evaluation/disposition. Pending TTS.   3:30pm -- Pt pending TTS consult at  shift change. He is medically cleared for psychiatric disposition. Oncoming provider made aware of pt. Pt remains stable. Currently voluntary as pt would like to seek inpatient treatment but should he change his mind should be reassessed for possible IVC considering SI with plan if not already assessed by TTS.   Clinical Impression:  1. Suicidal behavior with attempted self-injury ([place])   2. Suicidal ideation   3. Alcohol abuse   4. Polysubstance abuse ([place])      Data Unavailable           Final Clinical Impression(s) / ED Diagnoses Final diagnoses:  Suicidal ideation  Alcohol abuse  Polysubstance abuse ([place])  Suicidal behavior with attempted self-injury [facility])    Rx / DC Orders ED Discharge Orders     None         Turner Daniels 12/28/22 1535    Rancour, Annie Main, MD 12/28/22 1648

## 2022-12-28 NOTE — Group Note (Unsigned)
Date:  12/28/2022 Time:  10:36 PM  Group Topic/Focus:  Wrap-Up Group:   The focus of this group is to help patients review their daily goal of treatment and discuss progress on daily workbooks.     Participation Level:  {BHH PARTICIPATION HD:[id number]  Participation Quality:  {BHH PARTICIPATION QUALITY:22265}  Affect:  {BHH AFFECT:22266}  Cognitive:  {BHH COGNITIVE:22267}  Insight: {BHH Insight2:20797}  Engagement in Group:  {BHH ENGAGEMENT IN J[place]:[id number]  Modes of Intervention:  {BHH MODES OF INTERVENTION:22269}  Additional Comments:  ***  Debe Coder 12/28/2022, 10:36 PM

## 2022-12-28 NOTE — ED Triage Notes (Signed)
Pt presents to [facility] voluntarily, accompanied by [employer] due to suicidal ideations with no plan. Pt states " I just need somebody to talk to". Pt also reports detoxing from alcohol and drinks daily. Pt reports having a problem with alcohol for about thirty years. Pt reports not feeling good, but was unable to describe what symptoms he was feeling at this time. Pt reports history of Bipolar and Depression, but is not taking medications right now. Pt currently denies HI, AVH.

## 2022-12-28 NOTE — BH Assessment (Signed)
Comprehensive Clinical Assessment (CCA) Note  12/28/2022 [place] Doc Tea [id number]  Disposition: CCA completed alongside Leandro Reasoner, NP who recommends outpatient mental health follow-up.  The patient demonstrates the following risk factors for suicide: Chronic risk factors for suicide include: substance use disorder. Acute risk factors for suicide include: social withdrawal/isolation. Protective factors for this patient include: hope for the future. Considering these factors, the overall suicide risk at this point appears to be low. Patient is appropriate for outpatient follow up.  Chad Sanchez is a 52 year old single male who presents voluntarily accompanied by [employer], with complaints of suicidal ideation and drug use. Patient initially states he has a plan to jump off a bridge, then tells the provider he has no plan. Patient has a history of depression, cocaine use, and alcohol use. Patient states he called police from a convenient store because he needs someone to talk to and not to be given medication. Patient reports he drank a 12 pack of beer today and smoked $20 worth of crack. Patient denies HI, auditory or visual hallucinations. Patient denies access to guns.   Per chart review, Patient was discharged from [facility] today. He was there from 12/24/22-12/27/22. Per chart, upon discharge patient was given a bus pass to [place] and refused being connected to resources, stating he knew where he was going. When asked what happened following [facility] discharge, patient stated "life happened." Patient refused to provide any details of his day. Clinician asked Patient if there were resources or people he would like to be connected to and he declined.   Patient was agitated throughout the interview and became angry when asked questions. Patient reported he could not be helped right now, when provider tried to listen to his concerns. Patient stated "I'm trying to keep my nerves."  Patient was dressed casually,  alert and oriented. He did not appear to be responding to internal stimuli. Patient was passive and irritable throughout the assessment. Patient changed his answer to some questions at times. When asked what would be helpful for him, he stated "if I knew I would be asking you the questions."   Chief Complaint:  Chief Complaint  Patient presents with   Alcohol Problem   suicidal ideation   Visit Diagnosis:     CCA Screening, Triage and Referral (STR)  Patient Reported Information How did you hear about [place]? Self  What Is the Reason for Your Visit/Call Today? Patient presents voluntarily accompanied by [employer] due to suicidal ideations with a plan to jump off a bridge. Patient reports he drank a 12-pack of beer today and smoked $20 worth of cocaine. Patient denies HI, AH, or VH.  How Long Has This Been Causing You Problems? 1 wk - 1 month  What Do You Feel Would Help You the Most Today? Alcohol or Drug Use Treatment; Treatment for Depression or other mood problem   Have You Recently Had Any Thoughts About Hurting Yourself? Yes  Are You Planning to Commit Suicide/Harm Yourself At This time? Yes   [place] ED from 12/28/2022 in [facility] Admission (Discharged) from 12/24/2022 in [place] [facility] ED from 12/23/2022 in [facility] Emergency Department at [place] No Risk High Risk       Have you Recently Had Thoughts About [place]? No  Are You Planning to Harm Someone at This Time? No  Explanation: N/A   Have You Used Any Alcohol or  Drugs in the Past 24 Hours? Yes  What Did You Use and How Much? 12 pack of beer and $20 worth cocaine   Do You Currently Have a Therapist/Psychiatrist? No  Name of Therapist/Psychiatrist: Name of Therapist/Psychiatrist: N/A   Have You Been Recently Discharged From Any Office Practice or Programs? Yes  Explanation of Discharge  From Practice/Program: Hosptialized at [facility] 12/24/22-20-/23/24     CCA Screening Triage Referral Assessment Type of Contact: Face-to-Face  Telemedicine Service Delivery:   Is this Initial or Reassessment?   Date Telepsych consult ordered in [facility]:    Time Telepsych consult ordered in [facility]:    Location of Assessment: [facility] Assessment Services  Provider Location: [facility] Assessment Services   Collateral Involvement: None   Does Patient Have a Stage manager Guardian? No  Legal Guardian Contact Information: N/A  Copy of Legal Guardianship Form: -- (N/A)  Legal Guardian Notified of Arrival: -- (N/A)  Legal Guardian Notified of Pending Discharge: -- (N/A)  If Minor and Not Living with Parent(s), Who has Custody? N/A  Is CPS involved or ever been involved? Never  Is APS involved or ever been involved? Never   Patient Determined To Be At Risk for Harm To Self or Others Based on Review of Patient Reported Information or Presenting Complaint? Yes, for Self-Harm  Method: Plan with intent and identified person (Denies HI)  Availability of Means: No access or NA (Denies HI)  Intent: Clearly intends on inflicting harm that could cause death (Denies HI)  Notification Required: No need or identified person (Denies HI)  Additional Information for Danger to Others Potential: Previous attempts  Additional Comments for Danger to Others Potential: N/A  Are There Guns or Other Weapons in Your Home? No  Types of Guns/Weapons: N/A  Are These Weapons Safely Secured?                            -- (N/A)  Who Could Verify You Are Able To Have These Secured: N/A  Do You Have any Outstanding Charges, Pending Court Dates, Parole/Probation? No  Contacted To Inform of Risk of Harm To Self or Others: -- (N/A)    Does Patient Present under Involuntary Commitment? No    [place] of Residence: [place]   Patient Currently Receiving the Following Services: Not Receiving  Services   Determination of Need: Routine (7 days)   Options For Referral: Intensive Outpatient Therapy; Medication Management; Mobile Crisis     CCA Biopsychosocial Patient Reported Schizophrenia/Schizoaffective Diagnosis in Past: No   Strengths: Seeking help   Mental Health Symptoms Depression:   Hopelessness; Irritability   Duration of Depressive symptoms:  Duration of Depressive Symptoms: Greater than two weeks   Mania:   None   Anxiety:    Restlessness   Psychosis:   None   Duration of Psychotic symptoms:    Trauma:   None   Obsessions:   None   Compulsions:   None   Inattention:   None   Hyperactivity/Impulsivity:   None   Oppositional/Defiant Behaviors:   None   Emotional Irregularity:   None   Other Mood/Personality Symptoms:   N/A    Mental Status Exam Appearance and self-care  Stature:   Tall   Weight:   Average weight   Clothing:   Casual   Grooming:   Normal   Cosmetic use:   None   Posture/gait:   Normal   Motor activity:  Agitated   Sensorium  Attention:   Normal   Concentration:   Normal   Orientation:   X5   Recall/memory:   Normal   Affect and Mood  Affect:   Negative   Mood:   Irritable   Relating  Eye contact:   Normal   Facial expression:   Constricted   Attitude toward examiner:   Irritable; Passive   Thought and Language  Speech flow:  Normal   Thought content:   Appropriate to Mood and Circumstances   Preoccupation:   None   Hallucinations:   None   Organization:   Linear   Transport planner of Knowledge:   Average   Intelligence:   Average   Abstraction:   Functional   Judgement:   Poor   Reality Testing:   Adequate   Insight:   Poor   Decision Making:   Impulsive   Social Functioning  Social Maturity:   Self-centered   Social Judgement:   Normal   Stress  Stressors:   Housing   Coping Ability:   Programme researcher, broadcasting/film/video  Deficits:   None   Supports:   Support needed     Religion: Religion/Spirituality How Might This Affect Treatment?: N/A  Leisure/Recreation: Leisure / Recreation Do You Have Hobbies?: No Leisure and Hobbies: N/A  Exercise/Diet: Exercise/Diet Do You Exercise?: No Have You Gained or Lost A Significant Amount of Weight in the Past Six Months?: No Do You Follow a Special Diet?: No Do You Have Any Trouble Sleeping?: Yes Explanation of Sleeping Difficulties: Patient reports poor sleep.   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 by Current Illness: No Describe how Patient's Job has Been Impacted: N/A Has Patient ever Been in the [employer]?: No  Education: Education Is Patient Currently Attending School?: No Last Grade Completed: 12 Did [place]?: No Did You Have An Individualized Education Program (IIEP): No Did You Have Any Difficulty At School?: No Patient's Education Has Been Impacted by Current Illness: No   CCA Family/Childhood History Family and Relationship History: Family history Marital status: Single Does patient have children?: No  Childhood History:  Childhood History By whom was/is the patient raised?: Adoptive parents Description of patient's current relationship with siblings: Unknown Did patient suffer any verbal/emotional/physical/sexual abuse as a child?: No Did patient suffer from severe childhood neglect?: No Has patient ever been sexually abused/assaulted/raped as an adolescent or adult?: No Was the patient ever a victim of a crime or a disaster?: No Witnessed domestic violence?: No Has patient been affected by domestic violence as an adult?: No       CCA Substance Use Alcohol/Drug Use: Alcohol / Drug Use Pain Medications: See MAR Prescriptions: See MAR Over the Counter: See MAR History of alcohol / drug use?: Yes Longest period of sobriety (when/how  long): Unknown Negative Consequences of Use:  (N/A) Withdrawal Symptoms: None Substance #1 Name of Substance 1: Alcohol 1 - Age of First Use: 17 1 - Amount (size/oz): 12 pack of beer 1 - Frequency: Daily 1 - Duration: Ongoing 1 - Last Use / Amount: 1 hour before arrival 1 - Method of Aquiring: Purchase 1- Route of Use: Drinking Substance #2 Name of Substance 2: Cocaine 2 - Age of First Use: 17 2 - Amount (size/oz): $20 worth 2 - Frequency: 3x a month 2 - Duration: Ongoing 2 - Last Use / Amount: Today 2 - Method of Aquiring: Purchase 2 - Route  of Substance Use: Smoke                     ASAM's:  Six Dimensions of Multidimensional Assessment  Dimension 1:  Acute Intoxication and/or Withdrawal Potential:   Dimension 1:  Description of individual's past and current experiences of substance use and withdrawal: Patient does not identify any withdrawal symptoms  Dimension 2:  Biomedical Conditions and Complications:   Dimension 2:  Description of patient's biomedical conditions and  complications: Patient able to cope with current health condition  Dimension 3:  Emotional, Behavioral, or Cognitive Conditions and Complications:  Dimension 3:  Description of emotional, behavioral, or cognitive conditions and complications: Patient has a diagnosis of bipolar disorder, in addition to substance dependence  Dimension 4:  Readiness to Change:  Dimension 4:  Description of Readiness to Change criteria: Patient discharged from service today and declined follow-up with resources  Dimension 5:  Relapse, Continued use, or Continued Problem Potential:  Dimension 5:  Relapse, continued use, or continued problem potential critiera description: Patient is not engaged in his mental health and substance abuse care  Dimension 6:  Recovery/Living Environment:  Dimension 6:  Recovery/Iiving environment criteria description: Patient is homeless  ASAM Severity Score: ASAM's Severity Rating Score: 11   ASAM Recommended Level of Treatment:     Substance use Disorder (SUD) Substance Use Disorder (SUD)  Checklist Symptoms of Substance Use: Continued use despite persistent or recurrent social, interpersonal problems, caused or exacerbated by use, Persistent desire or unsuccessful efforts to cut down or control use, Presence of craving or strong urge to use  Recommendations for Services/Supports/Treatments: Recommendations for Services/Supports/Treatments Recommendations For Services/Supports/Treatments: SAIOP (Substance Abuse Intensive Outpatient Program)  Discharge Disposition:    DSM5 Diagnoses: Patient Active Problem List   Diagnosis Date Noted   Alcohol use disorder, severe, dependence ([place]) 05/17/2016   MDD (major depressive disorder), recurrent severe, 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

## 2022-12-28 NOTE — Group Note (Unsigned)
Date:  12/28/2022 Time:  11:42 PM  Group Topic/Focus:  Wrap-Up Group:   The focus of this group is to help patients review their daily goal of treatment and discuss progress on daily workbooks.     Participation Level:  {BHH PARTICIPATION WO:[id number]  Participation Quality:  {BHH PARTICIPATION QUALITY:22265}  Affect:  {BHH AFFECT:22266}  Cognitive:  {BHH COGNITIVE:22267}  Insight: {BHH Insight2:20797}  Engagement in Group:  {BHH ENGAGEMENT IN BP:[id number]  Modes of Intervention:  {BHH MODES OF INTERVENTION:22269}  Additional Comments:  ***  Debe Coder 12/28/2022, 11:42 PM

## 2022-12-28 NOTE — Discharge Instructions (Addendum)

## 2022-12-28 NOTE — ED Triage Notes (Signed)
Patient said he has been feeling suicidal for 3 days. Plan to jump in front of cars. Drinks alcohol every day, a case of beer. Last drink 2 hours ago to "keep from shaking." History of withdrawal seizures.

## 2022-12-28 NOTE — ED Notes (Addendum)
AOx4. Pt calm and cooperative throughout the shift. Medication compliant. Pt performs ADLs independently. Ambulates without assistance.

## 2022-12-29 ENCOUNTER — Other Ambulatory Visit: Payer: Self-pay

## 2022-12-29 ENCOUNTER — Encounter (HOSPITAL_COMMUNITY): Payer: Self-pay | Admitting: Psychiatry

## 2022-12-29 DIAGNOSIS — F1410 Cocaine abuse, uncomplicated: Secondary | ICD-10-CM | POA: Insufficient documentation

## 2022-12-29 DIAGNOSIS — F1999 Other psychoactive substance use, unspecified with unspecified psychoactive substance-induced disorder: Secondary | ICD-10-CM

## 2022-12-29 LAB — TSH: TSH: 0.928 u[IU]/mL (ref 0.350–4.500)

## 2022-12-29 MED ORDER — NICOTINE POLACRILEX 2 MG MT GUM: 2.0000 mg | CHEWING_GUM | OROMUCOSAL | Status: DC | PRN

## 2022-12-29 MED ORDER — NICOTINE 7 MG/24HR TD PT24
7.0000 mg | MEDICATED_PATCH | Freq: Every day | TRANSDERMAL | Status: DC
  Filled 2022-12-29 (×5): qty 1

## 2022-12-29 NOTE — Progress Notes (Signed)
   12/29/22 2257  Psych Admission Type (Psych Patients Only)  Admission Status Voluntary  Psychosocial Assessment  Patient Complaints Substance abuse;Depression  Eye Contact Fair  Facial Expression Flat  Affect Appropriate to circumstance  Speech Logical/coherent  Interaction Assertive  Motor Activity Other (Comment) (WDL)  Appearance/Hygiene Unremarkable  Behavior Characteristics Appropriate to situation  Mood Depressed;Pleasant  Thought Process  Coherency WDL  Content WDL  Delusions None reported or observed  Perception WDL  Hallucination None reported or observed  Judgment Impaired  Confusion None  Danger to Self  Current suicidal ideation? Denies  Agreement Not to Harm Self Yes  Description of Agreement verbal  Danger to Others  Danger to Others None reported or observed

## 2022-12-29 NOTE — H&P (Cosign Needed Addendum)
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[place]) Active Problems:   Alcohol-induced mood disorder ([place])   Alcohol use disorder, severe, dependence ([place])   MDD (major depressive disorder)   Cocaine use disorder ([place])  History of Present Illness:  Patient is a 51 year old male with a reported psychiatric history of bipolar disorder and anxiety disorder, who was admitted to the [facility] 12/28/22 after initially presenting to [facility] 12/28/22 0500 voluntarily via law enforcement for passive suicidal ideation after being discharged from [facility] 12/27/2022 for worsening depression, suicidal thoughts, and alcohol detox. He was assessed and determined not to meet admission criteria then discharged with substance abuse resources. He later presented [facility] with similar complaints where he was assessed and cleared for psychiatric admissions.   24 hr chart review: Patient arrived on the unit overnight where he has remained in his room receiving meals, medications, and not participating in any unit groups or activities.   Assessment: Patient assessed in his room where he was observed laying in bed asleep. He presents slightly irritable mood with congruent affect, brightens on approach. He reports reason for readmission was due to 'being upset when I left so I drank a case of beer and smoked some cocaine'. States he is now 'coming around' and has a plan to return to [facility] in time for court Thursday related to an open container (alcohol) misdemeanor case so he can go to [place] to complete program. 'I got sidetracked and they brought me back here'. He describes feeling 'irritable' related to 'coming off the cocaine'  denies any agitation, active cravings, or other withdrawal symptoms. He denies any active SI/HI/AVH. He is inquiring about 72 hour forms as he 'doesn't plan to be here long, I just have to get past the feeling'. He reports plan to return to [place] to live where he has secured housing. He denies any safety concerns at this time. Most recent medications restarted.   Per H&P 12/24/2022:  Past psychiatric history: Patient reports being diagnosed with bipolar disorder and anxiety in the past.  Patient reports history of multiple psychiatric hospitalizations. Patient denies a history of any suicide attempts.  Patient reports not taking any medications for quite some time but is unable to specify if this is a months or years.  Reports taking Seroquel in the past.  Denies knowledge of other medication trials but reports he has taken other psychiatric medications in the past.   Past medical history: Patient denies any history of chronic or acute medical illness. Denies any history of seizures.  Denies any history of surgeries.  NKDA.   Substance use history: Patient reports extensive history of alcohol abuse and cocaine use disorder; completed [facility] 2 year program 2009, remained clean x2 years before relapsing. Alcohol, crack cocaine last use Friday/Saturday. He previously reports drinking 1 case of beer per day regularly for years.  Denies any history of seizures or been hospitalized for alcohol withdrawal.  Patient reports using cocaine about 4-5 times a month, last use Friday/Saturday.  Patient reports smoking about 1/2 pack of cigarettes per day.  Denies any other illicit substance use.   Family history: Unknown as patient was adopted at 25 weeks old.   Social history: Patient reports being originally from [place], [place]. Currently lives in [place] area.  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  Associated Signs/Symptoms: Depression Symptoms:  depressed mood, (Hypo) Manic Symptoms:  Irritable  Mood, Anxiety Symptoms:   feelings of nervousness Psychotic Symptoms:   none noted PTSD Symptoms: NA Total Time spent with patient: 45 minutes  Past Psychiatric History:   Is the patient at risk to self? No.  Has the patient been a risk to self in the past 6 months? Yes.    Has the patient been a risk to self within the distant past? Yes.    Is the patient a risk to others? No.  Has the patient been a risk to others in the past 6 months? No.  Has the patient been a risk to others within the distant past? No.   [place] Scale:  [place] Admission (Current) from 12/28/2022 in [facility] Most recent reading at 12/29/2022 12:14 AM ED from 12/28/2022 in [facility] Emergency Department at [facility] Most recent reading at 12/28/2022  7:13 AM ED from 12/28/2022 in [facility] Most recent reading at 12/28/2022  5:04 AM  C-SSRS RISK CATEGORY High Risk High Risk Low Risk        Prior Inpatient Therapy: Yes.   If yes, describe patient discharge within past 72 hours  Prior Outpatient Therapy: Yes.   If yes, describe none active at this time   Alcohol Screening: 1. How often do you have a drink containing alcohol?: 4 or more times a week 2. How many drinks containing alcohol do you have on a typical day when you are drinking?: 10 or more 3. How often do you have six or more drinks on one occasion?: Daily or almost daily AUDIT-C Score: 12 4. How often during the last year have you found that you were not able to stop drinking once you had started?: Weekly 5. How often during the last year have you failed to do what was normally expected from you because of drinking?: Weekly 6. How often during the last year have you needed a first drink in the morning to get yourself going after a heavy drinking session?: Weekly 7. How often during the last year have you had a feeling of guilt of remorse after drinking?: Weekly 8. How  often during the last year have you been unable to remember what happened the night before because you had been drinking?: Weekly 9. Have you or someone else been injured as a result of your drinking?: No 10. Has a relative or friend or a doctor or another health worker been concerned about your drinking or suggested you cut down?: Yes, during the last year Alcohol Use Disorder Identification Test Final Score (AUDIT): 31 Alcohol Brief Interventions/Follow-up: Alcohol education/Brief advice Substance Abuse History in the last 12 months:  Yes.   Consequences of Substance Abuse: Withdrawal Symptoms:   agitation Previous Psychotropic Medications: Yes  Psychological Evaluations: Yes  Past Medical History:  Past Medical History:  Diagnosis Date   Alcohol abuse    Anxiety    Depression    Hypertension    Sickle cell anemia (HCC)     Past Surgical History:  Procedure Laterality Date   TONSILLECTOMY     Family History:  Family History  Problem Relation Age of Onset   Cirrhosis Mother    Family Psychiatric  History: ETOH abuse: mother Tobacco Screening:  Social History   Tobacco Use  Smoking Status Every Day   Packs/day: 0.50   Types:  Cigarettes  Smokeless Tobacco Never    BH Tobacco Counseling     Are you interested in Tobacco Cessation Medications?  No, patient refused Counseled patient on smoking cessation:  Yes Reason Tobacco Screening Not Completed: No value filed.       Social History:  Social History   Substance and Sexual Activity  Alcohol Use Yes   Alcohol/week: 12.0 standard drinks of alcohol   Types: 12 Cans of beer per week   Comment: a case a day; pt told this Probation officer 12 pk daily or a fifth of vodka     Social History   Substance and Sexual Activity  Drug Use Yes   Types: "Crack" cocaine, Cocaine   Comment: last used 2 weeks ago    Additional Social History: - pending misdemeanor charges for open container in [facility] - completed [place] in  2010  Allergies:   Allergies  Allergen Reactions   Fish Allergy Anaphylaxis   Shellfish Allergy Anaphylaxis   Lab Results:  Results for orders placed or performed during the hospital encounter of 12/28/22 (from the past 48 hour(s))  CBC with Differential     Status: None   Collection Time: 12/28/22  8:12 AM  Result Value Ref Range   WBC 5.5 4.0 - 10.5 K/uL   RBC 5.34 4.22 - 5.81 MIL/uL   Hemoglobin 13.9 13.0 - 17.0 g/dL   HCT 44.0 39.0 - 52.0 %   MCV 82.4 80.0 - 100.0 fL   MCH 26.0 26.0 - 34.0 pg   MCHC 31.6 30.0 - 36.0 g/dL   RDW 14.2 11.5 - 15.5 %   Platelets 263 150 - 400 K/uL   nRBC 0.0 0.0 - 0.2 %   Neutrophils Relative % 55 %   Neutro Abs 3.1 1.7 - 7.7 K/uL   Lymphocytes Relative 35 %   Lymphs Abs 1.9 0.7 - 4.0 K/uL   Monocytes Relative 8 %   Monocytes Absolute 0.4 0.1 - 1.0 K/uL   Eosinophils Relative 1 %   Eosinophils Absolute 0.0 0.0 - 0.5 K/uL   Basophils Relative 0 %   Basophils Absolute 0.0 0.0 - 0.1 K/uL   Immature Granulocytes 1 %   Abs Immature Granulocytes 0.03 0.00 - 0.07 K/uL    Comment: Performed at [facility], [place] [address] [place], [place] [address]  Comprehensive metabolic panel     Status: Abnormal   Collection Time: 12/28/22  8:12 AM  Result Value Ref Range   Sodium 141 135 - 145 mmol/L   Potassium 4.4 3.5 - 5.1 mmol/L   Chloride 103 98 - 111 mmol/L   CO2 25 22 - 32 mmol/L   Glucose, Bld 93 70 - 99 mg/dL    Comment: Glucose reference range applies only to samples taken after fasting for at least 8 hours.   BUN 9 6 - 20 mg/dL   Creatinine, Ser 0.93 0.61 - 1.24 mg/dL   Calcium 8.9 8.9 - 10.3 mg/dL   Total Protein 8.2 (H) 6.5 - 8.1 g/dL   Albumin 4.6 3.5 - 5.0 g/dL   AST 25 15 - 41 U/L   ALT 27 0 - 44 U/L   Alkaline Phosphatase 60 38 - 126 U/L   Total Bilirubin 0.4 0.3 - 1.2 mg/dL   GFR, Estimated >60 >60 mL/min    Comment: (NOTE) Calculated using the CKD-EPI Creatinine Equation (2021)    Anion gap 13 5 - 15     Comment: Performed at [facility], [place] [address], [place], [place] [address]  Ethanol     Status: Abnormal   Collection Time: 12/28/22  8:12 AM  Result Value Ref Range   Alcohol, Ethyl (B) 263 (H) <10 mg/dL    Comment: (NOTE) Lowest detectable limit for serum alcohol is 10 mg/dL.  For medical purposes only. Performed at [facility], [place] [address], [place], [place] [address]   Acetaminophen level     Status: Abnormal   Collection Time: 12/28/22  8:12 AM  Result Value Ref Range   Acetaminophen (Tylenol), Serum <10 (L) 10 - 30 ug/mL    Comment: (NOTE) Therapeutic concentrations vary significantly. A range of 10-30 ug/mL  may be an effective concentration for many patients. However, some  are best treated at concentrations outside of this range. Acetaminophen concentrations >150 ug/mL at 4 hours after ingestion  and >50 ug/mL at 12 hours after ingestion are often associated with  toxic reactions.  Performed at [facility], [place] [address], [place], [place] [id number]   Salicylate level     Status: Abnormal   Collection Time: 12/28/22  8:12 AM  Result Value Ref Range   Salicylate Lvl [id number] (L) 7.0 - 30.0 mg/dL    Comment: Performed at [facility] [place] [address] [place], [place] [address]  Lipase, blood     Status: None   Collection Time: 12/28/22  8:12 AM  Result Value Ref Range   Lipase 46 11 - 51 U/L    Comment: Performed at [facility], [place] [address], [place], [place] [address]  Rapid urine drug screen (hospital performed)     Status: Abnormal   Collection Time: 12/28/22 10:18 AM  Result Value Ref Range   Opiates NONE DETECTED NONE DETECTED   Cocaine POSITIVE (A) NONE DETECTED   Benzodiazepines NONE DETECTED NONE DETECTED   Amphetamines NONE DETECTED NONE DETECTED   Tetrahydrocannabinol NONE DETECTED NONE DETECTED   Barbiturates NONE DETECTED NONE DETECTED    Comment:  (NOTE) DRUG SCREEN FOR MEDICAL PURPOSES ONLY.  IF CONFIRMATION IS NEEDED FOR ANY PURPOSE, NOTIFY LAB WITHIN 5 DAYS.  LOWEST DETECTABLE LIMITS FOR URINE DRUG SCREEN Drug Class                     Cutoff (ng/mL) Amphetamine and metabolites    1000 Barbiturate and metabolites    200 Benzodiazepine                 200 Opiates and metabolites        300 Cocaine and metabolites        300 THC                            50 Performed at [facility], [place] [address] [place], [place] [address]   Resp panel by RT-PCR (RSV, Flu A&B, Covid) Anterior Nasal Swab     Status: None   Collection Time: 12/28/22  6:20 PM   Specimen: Anterior Nasal Swab  Result Value Ref Range   SARS Coronavirus 2 by RT PCR NEGATIVE NEGATIVE    Comment: (NOTE) SARS-CoV-2 target nucleic acids are NOT DETECTED.  The SARS-CoV-2 RNA is generally detectable in upper respiratory specimens during the acute phase of infection. The lowest concentration of SARS-CoV-2 viral copies this assay can detect is 138 copies/mL. A negative result does not preclude SARS-Cov-2 infection and should not be used as  the sole basis for treatment or other patient management decisions. A negative result may occur with  improper specimen collection/handling, submission of specimen other than nasopharyngeal swab, presence of viral mutation(s) within the areas targeted by this assay, and inadequate number of viral copies(<138 copies/mL). A negative result must be combined with clinical observations, patient history, and epidemiological information. The expected result is Negative.  Fact Sheet for Patients:  EntrepreneurPulse.com.au  Fact Sheet for Healthcare Providers:  IncredibleEmployment.be  This test is no t yet approved or cleared by the [place] FDA and  has been authorized for detection and/or diagnosis of SARS-CoV-2 by FDA under an Emergency Use Authorization (EUA). This  EUA will remain  in effect (meaning this test can be used) for the duration of the COVID-19 declaration under Section 564(b)(1) of the Act, 21 U.S.C.section 360bbb-3(b)(1), unless the authorization is terminated  or revoked sooner.       Influenza A by PCR NEGATIVE NEGATIVE   Influenza B by PCR NEGATIVE NEGATIVE    Comment: (NOTE) The Xpert Xpress SARS-CoV-2/FLU/RSV plus assay is intended as an aid in the diagnosis of influenza from Nasopharyngeal swab specimens and should not be used as a sole basis for treatment. Nasal washings and aspirates are unacceptable for Xpert Xpress SARS-CoV-2/FLU/RSV testing.  Fact Sheet for Patients: EntrepreneurPulse.com.au  Fact Sheet for Healthcare Providers: IncredibleEmployment.be  This test is not yet approved or cleared by the [place] FDA and has been authorized for detection and/or diagnosis of SARS-CoV-2 by FDA under an Emergency Use Authorization (EUA). This EUA will remain in effect (meaning this test can be used) for the duration of the COVID-19 declaration under Section 564(b)(1) of the Act, 21 U.S.C. section 360bbb-3(b)(1), unless the authorization is terminated or revoked.     Resp Syncytial Virus by PCR NEGATIVE NEGATIVE    Comment: (NOTE) Fact Sheet for Patients: EntrepreneurPulse.com.au  Fact Sheet for Healthcare Providers: IncredibleEmployment.be  This test is not yet approved or cleared by the [place] FDA and has been authorized for detection and/or diagnosis of SARS-CoV-2 by FDA under an Emergency Use Authorization (EUA). This EUA will remain in effect (meaning this test can be used) for the duration of the COVID-19 declaration under Section 564(b)(1) of the Act, 21 U.S.C. section 360bbb-3(b)(1), unless the authorization is terminated or revoked.  Performed at [facility], [place] [address], [place], [place] [address]      Blood Alcohol level:  Lab Results  Component Value Date   ETH 263 (H) 12/28/2022   ETH <10 0000000    Metabolic Disorder Labs:  No results found for: "HGBA1C", "MPG" No results found for: "PROLACTIN" No results found for: "CHOL", "TRIG", "HDL", "CHOLHDL", "VLDL", "LDLCALC"  Current Medications: Current Facility-Administered Medications  Medication Dose Route Frequency Provider Last Rate Last Admin   acetaminophen (TYLENOL) tablet 650 mg  650 mg Oral Q6H PRN Motley-Mangrum, Jadeka A, PMHNP       alum & mag hydroxide-simeth (MAALOX/MYLANTA) 200-200-20 MG/5ML suspension 30 mL  30 mL Oral Q4H PRN Motley-Mangrum, Jadeka A, PMHNP       diphenhydrAMINE (BENADRYL) injection 50 mg  50 mg Intramuscular TID PRN Motley-Mangrum, Jadeka A, PMHNP       diphenhydrAMINE (BENADRYL) injection 50 mg  50 mg Intramuscular TID PRN Motley-Mangrum, Jadeka A, PMHNP       FLUoxetine (PROZAC) capsule 20 mg  20 mg Oral Daily Motley-Mangrum, Jadeka A, PMHNP   20 mg at 12/29/22 0759   gabapentin (NEURONTIN) capsule 100 mg  100 mg Oral BID Motley-Mangrum, Jadeka A, PMHNP   100 mg at 12/29/22 0759   haloperidol (HALDOL) tablet 5 mg  5 mg Oral TID PRN Motley-Mangrum, Donneta Romberg A, PMHNP       Or   haloperidol lactate (HALDOL) injection 5 mg  5 mg Intramuscular TID PRN Motley-Mangrum, Donneta Romberg A, PMHNP       hydrOXYzine (ATARAX) tablet 25 mg  25 mg Oral TID PRN Motley-Mangrum, Jadeka A, PMHNP   25 mg at 12/29/22 0759   LORazepam (ATIVAN) injection 1-4 mg  1-4 mg Intravenous Q1H PRN Motley-Mangrum, Jadeka A, PMHNP       LORazepam (ATIVAN) tablet 2 mg  2 mg Oral TID PRN Motley-Mangrum, Jadeka A, PMHNP       LORazepam (ATIVAN) tablet 2 mg  2 mg Oral TID PRN Motley-Mangrum, Jadeka A, PMHNP       magnesium hydroxide (MILK OF MAGNESIA) suspension 30 mL  30 mL Oral Daily PRN Motley-Mangrum, Jadeka A, PMHNP       multivitamin with minerals tablet 1 tablet  1 tablet Oral Daily Motley-Mangrum, Jadeka A, PMHNP   1 tablet at  12/29/22 0759   QUEtiapine (SEROQUEL XR) 24 hr tablet 50 mg  50 mg Oral QHS Motley-Mangrum, Jadeka A, PMHNP       thiamine (VITAMIN B1) injection 100 mg  100 mg Intravenous Daily Motley-Mangrum, Jadeka A, PMHNP       traZODone (DESYREL) tablet 50 mg  50 mg Oral QHS PRN Motley-Mangrum, Jadeka A, PMHNP       PTA Medications: Medications Prior to Admission  Medication Sig Dispense Refill Last Dose   FLUoxetine (PROZAC) 20 MG capsule Take 1 capsule (20 mg total) by mouth daily. 14 capsule 0    gabapentin (NEURONTIN) 100 MG capsule Take 1 capsule (100 mg total) by mouth 2 (two) times daily for 14 days. 28 capsule 0    Multiple Vitamin (MULTIVITAMIN WITH MINERALS) TABS tablet Take 1 tablet by mouth daily. (Patient not taking: Reported on 12/23/2022)      nicotine (NICODERM CQ - DOSED IN MG/24 HOURS) 14 mg/24hr patch Place 1 patch (14 mg total) onto the skin daily. (Patient not taking: Reported on 12/28/2022) 28 patch 0    QUEtiapine (SEROQUEL XR) 50 MG TB24 24 hr tablet Take 1 tablet (50 mg total) by mouth at bedtime for 14 days. 14 tablet 0    thiamine (VITAMIN B-1) 100 MG tablet Take 1 tablet (100 mg total) by mouth daily.       Musculoskeletal: Strength & Muscle Tone: within normal limits Gait & Station: normal Patient leans: N/A  Psychiatric Specialty Exam:  Presentation  General Appearance:  Casual; Appropriate for Environment  Eye Contact: Fair  Speech: Clear and Coherent  Speech Volume: Normal  Handedness: Right   Mood and Affect  Mood: Euthymic  Affect: Appropriate; Congruent   Thought Process  Thought Processes: Coherent  Duration of Psychotic Symptoms: depression over past few weeks; relapsed hours after discharge from [facility] Past Diagnosis of Schizophrenia or Psychoactive disorder: No  Descriptions of Associations:Intact  Orientation:Full (Time, Place and Person)  Thought Content:Logical  Hallucinations:Hallucinations: None  Ideas of  Reference:None  Suicidal Thoughts:Suicidal Thoughts: No SI Active Intent and/or Plan: With Intent; With Plan  Homicidal Thoughts:Homicidal Thoughts: No   Sensorium  Memory: Immediate Good; Recent Fair  Judgment: Fair  Insight: Fair   Materials engineer: Fair  Attention Span: Fair  Recall: [employer] of Knowledge: Fair  Language: Fair   Psychomotor  Activity  Psychomotor Activity: Psychomotor Activity: Normal   Assets  Assets: Communication Skills; Physical Health; Resilience   Sleep  Sleep: Sleep: Fair    Physical Exam: Physical Exam Vitals and nursing note reviewed.  Constitutional:      General: He is not in acute distress.    Appearance: He is normal weight.  HENT:     Head: Normocephalic.     Nose: Nose normal.     Mouth/Throat:     Pharynx: Oropharynx is clear.  Eyes:     Pupils: Pupils are equal, round, and reactive to light.  Cardiovascular:     Rate and Rhythm: Tachycardia present.  Pulmonary:     Effort: Pulmonary effort is normal.  Abdominal:     Palpations: Abdomen is soft.  Musculoskeletal:        General: Normal range of motion.     Cervical back: Normal range of motion.  Skin:    General: Skin is warm and dry.  Neurological:     Mental Status: He is alert and oriented to person, place, and time.  Psychiatric:        Attention and Perception: Attention and perception normal.        Mood and Affect: Mood and affect normal.        Speech: Speech normal.        Behavior: Behavior normal. Behavior is cooperative.        Thought Content: Thought content normal. Thought content is not paranoid or delusional. Thought content does not include homicidal or suicidal ideation. Thought content does not include homicidal or suicidal plan.        Cognition and Memory: Cognition and memory normal.        Judgment: Judgment normal.    Review of Systems  Psychiatric/Behavioral:  Positive for substance abuse.   All  other systems reviewed and are negative.  Blood pressure (!) 120/92, pulse (!) 105, temperature 98.3 F (36.8 C), temperature source Oral, resp. rate 16, height '6\' 2"'$  (1.88 m), weight 75.3 kg, SpO2 100 %. Body mass index is 21.31 kg/m.  Treatment Plan Summary: Daily contact with patient to assess and evaluate symptoms and progress in treatment, Medication management, and Plan   Diagnoses / Active Problems: -Substance induced disorder -Cocaine Use disorder -Alcohol use disorder, severe   PLAN: Safety and Monitoring:             --  Voluntary admission to inpatient psychiatric unit for safety, stabilization and  treatment             -- Daily contact with patient to assess and evaluate symptoms and progress in  treatment             -- Patient's case to be discussed in multi-disciplinary team meeting             -- Observation Level : q15 minute checks             -- Vital signs:  q12 hours             -- Precautions: suicide, elopement, and assault   2. Psychiatric Diagnoses and Treatment:  Medications:  Start:  - Seroquel XR 50 mg nightly.   - Seroquel IR 50 mg nightly as needed-for insomnia and treatment of mood disorder.  Patient reports Seroquel worked well in the past for mood disorder and insomnia. - fluoxetine 20 mg once daily for MDD and GAD - gabapentin 100 mg 3 times daily -alcohol use disorder  and anxiety in context of substance use disorder. -CIWA protocol with scheduled Ativan taper for alcohol use disorder and alcohol withdrawal  PRNs  - Haldol PO and IM PRN for agitation protocol - Trazodone 50 mg HS PRN insomnia - Milk of Magnesia 30 mL daily PRN mild constipation - Hydroxyzine 25 mg TID PRN anxiety - Diphenhydramine 50 mg IM TID PRN agitation - Acetaminophen 650 mg PO q6hrs PRN mild pain - Maalox/Mylanta 30 mL q4hrs PRN indigestion  --  The risks/benefits/side-effects/alternatives to this medication were discussed in detail with the patient and time was given  for questions. The patient consents to medication trial.                -- Metabolic profile and EKG monitoring obtained while on an atypical antipsychotic  (BMI: Lipid Panel: HbgA1c: QTc:)               -- Encouraged patient to participate in unit milieu and in scheduled group therapies               -- Short Term Goals: Ability to identify changes in lifestyle to reduce recurrence of  condition will improve, Ability to verbalize feelings will improve, Ability to disclose and  discuss suicidal ideas, Ability to demonstrate self-control will improve, Ability to identify and develop effective coping behaviors will improve, Ability to maintain clinical measurements within normal limits will improve, Compliance with prescribed medications will improve, and Ability to identify triggers associated with substance abuse/mental health issues will improve              -- Long Term Goals: Improvement in symptoms so as ready for discharge                3. Medical Issues Being Addressed:              Tobacco Use Disorder             -- Nicotine patch 14 mg/24 hours ordered  -- Nicorette gum             -- Smoking cessation encouraged   4. Discharge Planning:              -- Social work and case management to assist with discharge planning and identification of hospital follow-up needs prior to discharge             -- Estimated LOS: 5-7 days             -- Discharge Concerns: Need to establish a safety plan; Medication compliance and effectiveness             -- Discharge Goals: Return home with outpatient referrals for mental health follow-up including medication management/psychotherapy    Observation Level/Precautions:  15 minute checks  Laboratory:  CBC Chemistry Profile Folic Acid GGT HCG UDS UA UDS+ cocaine, BAL 263  Psychotherapy:  group, milieu  Medications:  see MAR  Consultations:   social work  Discharge Concerns:   safety  Estimated LOS:   3-5 days  Other:     Physician  Treatment Plan for Primary Diagnosis: 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[place]) Long Term Goal(s): Improvement in symptoms so as ready for discharge  Short Term Goals: Ability to identify changes in lifestyle to reduce recurrence of condition will improve, Ability to verbalize feelings will improve, Ability to disclose and discuss suicidal ideas, Ability to demonstrate self-control will improve, Ability to identify and develop effective coping behaviors will improve, Ability to maintain clinical measurements within  normal limits will improve, Compliance with prescribed medications will improve, and Ability to identify triggers associated with substance abuse/mental health issues will improve  Physician Treatment Plan for Secondary Diagnosis: Principal Problem:   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[place]) Active Problems:   Alcohol-induced mood disorder ([place])   Alcohol use disorder, severe, dependence ([place])   MDD (major depressive disorder)   Cocaine use disorder ([place])  Long Term Goal(s): Improvement in symptoms so as ready for discharge  Short Term Goals: Ability to identify changes in lifestyle to reduce recurrence of condition will improve, Ability to verbalize feelings will improve, Ability to disclose and discuss suicidal ideas, Ability to demonstrate self-control will improve, Ability to identify and develop effective coping behaviors will improve, Ability to maintain clinical measurements within normal limits will improve, Compliance with prescribed medications will improve, and Ability to identify triggers associated with substance abuse/mental health issues will improve  I certify that inpatient services furnished can reasonably be expected to improve the patient's condition.    Inda Merlin, NP 2/25/20242:19 PM

## 2022-12-29 NOTE — Progress Notes (Signed)
Patient signed a 72 hr request for discharge on 12/29/22 @ 1612

## 2022-12-29 NOTE — BHH Group Notes (Signed)
Adult Psychoeducational Group Note Date:  12/29/2022 Time:  0900-1000 Group Topic/Focus: PROGRESSIVE RELAXATION. A group where deep breathing is taught and tensing and relaxation muscle groups is used. Imagery is used as well.  Pts are asked to imagine 3 pillars that hold them up when they are not able to hold themselves up and to share that with the group.   Participation Level: did not attend   : Chad Sanchez

## 2022-12-29 NOTE — Group Note (Unsigned)
Date:  12/29/2022 Time:  11:19 PM  Group Topic/Focus:  Wrap-Up Group:   The focus of this group is to help patients review their daily goal of treatment and discuss progress on daily workbooks.     Participation Level:  {BHH PARTICIPATION WO:[id number]  Participation Quality:  {BHH PARTICIPATION QUALITY:22265}  Affect:  {BHH AFFECT:22266}  Cognitive:  {BHH COGNITIVE:22267}  Insight: {BHH Insight2:20797}  Engagement in Group:  {BHH ENGAGEMENT IN BP:[id number]  Modes of Intervention:  {BHH MODES OF INTERVENTION:22269}  Additional Comments:  ***  Debe Coder 12/29/2022, 11:19 PM

## 2022-12-29 NOTE — BHH Group Notes (Signed)
Adult Psychoeducational Group  Date:  12/22/2022 Time: 1300-1400  Group Topic/Focus: Continuation of the group from Saturday. Looking at the lists that were created and talking about what needs to be done with the homework of 30 positives about themselves.                                     Talking about taking their power back and helping themselves to develop a positive self esteem.      Participation Quality:  did not attend  Chad Sanchez

## 2022-12-29 NOTE — Tx Team (Signed)
Initial Treatment Plan 12/29/2022 12:32 AM Chad Sanchez [id number]    PATIENT STRESSORS: Financial difficulties   Substance abuse     PATIENT STRENGTHS: Active sense of humor  Average or above average intelligence  Communication skills  Physical Health    PATIENT IDENTIFIED PROBLEMS: Depression  Suicidal Ideation  Substance abuse        "I didn't give up, I want to get sober"         DISCHARGE CRITERIA:  Improved stabilization in mood, thinking, and/or behavior Motivation to continue treatment in a less acute level of care Need for constant or close observation no longer present Verbal commitment to aftercare and medication compliance Withdrawal symptoms are absent or subacute and managed without 24-hour nursing intervention  PRELIMINARY DISCHARGE PLAN: Attend 12-step recovery group Outpatient therapy Return to previous living arrangement  PATIENT/FAMILY INVOLVEMENT: This treatment plan has been presented to and reviewed with the patient, Chad Sanchez. The patient and family have been given the opportunity to ask questions and make suggestions.  Margaretann Loveless, RN 12/29/2022, 12:32 AM

## 2022-12-29 NOTE — Group Note (Unsigned)
Date:  12/29/2022 Time:  11:29 PM  Group Topic/Focus:  Wrap-Up Group:   The focus of this group is to help patients review their daily goal of treatment and discuss progress on daily workbooks.     Participation Level:  {BHH PARTICIPATION WO:[id number]  Participation Quality:  {BHH PARTICIPATION QUALITY:22265}  Affect:  {BHH AFFECT:22266}  Cognitive:  {BHH COGNITIVE:22267}  Insight: {BHH Insight2:20797}  Engagement in Group:  {BHH ENGAGEMENT IN BP:8198245  Modes of Intervention:  {BHH MODES OF INTERVENTION:22269}  Additional Comments:  ***  Debe Coder 12/29/2022, 11:29 PM

## 2022-12-29 NOTE — Group Note (Unsigned)
Date:  12/29/2022 Time:  11:15 PM  Group Topic/Focus:  Wrap-Up Group:   The focus of this group is to help patients review their daily goal of treatment and discuss progress on daily workbooks.     Participation Level:  {BHH PARTICIPATION HD:[id number]  Participation Quality:  {BHH PARTICIPATION QUALITY:22265}  Affect:  {BHH AFFECT:22266}  Cognitive:  {BHH COGNITIVE:22267}  Insight: {BHH Insight2:20797}  Engagement in Group:  {BHH ENGAGEMENT IN JY:[id number]  Modes of Intervention:  {BHH MODES OF INTERVENTION:22269}  Additional Comments:  ***  Debe Coder 12/29/2022, 11:15 PM

## 2022-12-29 NOTE — Group Note (Signed)
LCSW Group Therapy Note  Group Date: 12/29/2022 Start Time: [id number] End Time: 1100   Type of Therapy and Topic:  Group Therapy - How To Cope with Nervousness about Discharge   Participation Level:  Did Not Attend   Description of Group This process group involved identification of patients' feelings about discharge. Some of them are scheduled to be discharged soon, while others are new admissions, but each of them was asked to share thoughts and feelings surrounding discharge from the hospital. One common theme was that they are excited at the prospect of going home, while another was that many of them are apprehensive about sharing why they were hospitalized. Patients were given the opportunity to discuss these feelings with their peers in preparation for discharge.  Therapeutic Goals  Patient will identify their overall feelings about pending discharge. Patient will think about how they might proactively address issues that they believe will once again arise once they get home (i.e. with parents). Patients will participate in discussion about having hope for change.   Summary of Patient Progress:  did not attend   Therapeutic Modalities Cognitive Behavioral Therapy   Lubertha South, Latanya Presser 12/29/2022  3:13 PM

## 2022-12-29 NOTE — Progress Notes (Signed)
   12/29/22 0026  Psych Admission Type (Psych Patients Only)  Admission Status Voluntary  Psychosocial Assessment  Patient Complaints Substance abuse;Depression  Eye Contact Fair  Facial Expression Flat  Affect Appropriate to circumstance  Speech Logical/coherent  Interaction Assertive  Motor Activity Other (Comment) (WDL)  Appearance/Hygiene Unremarkable  Behavior Characteristics Appropriate to situation  Mood Depressed;Pleasant  Thought Process  Coherency WDL  Content WDL  Delusions None reported or observed  Perception WDL  Hallucination None reported or observed  Judgment Impaired  Confusion None  Danger to Self  Current suicidal ideation? Denies  Description of Suicide Plan no plan  Agreement Not to Harm Self Yes  Description of Agreement verbal  Danger to Others  Danger to Others None reported or observed

## 2022-12-29 NOTE — Group Note (Unsigned)
Date:  12/29/2022 Time:  11:31 PM  Group Topic/Focus:  Wrap-Up Group:   The focus of this group is to help patients review their daily goal of treatment and discuss progress on daily workbooks.     Participation Level:  {BHH PARTICIPATION WO:[id number]  Participation Quality:  {BHH PARTICIPATION QUALITY:22265}  Affect:  {BHH AFFECT:22266}  Cognitive:  {BHH COGNITIVE:22267}  Insight: {BHH Insight2:20797}  Engagement in Group:  {BHH ENGAGEMENT IN BP:8198245  Modes of Intervention:  {BHH MODES OF INTERVENTION:22269}  Additional Comments:  ***  Debe Coder 12/29/2022, 11:31 PM

## 2022-12-29 NOTE — Group Note (Unsigned)
Date:  12/29/2022 Time:  11:08 PM  Group Topic/Focus:  Wrap-Up Group:   The focus of this group is to help patients review their daily goal of treatment and discuss progress on daily workbooks.     Participation Level:  {BHH PARTICIPATION HD:[id number]  Participation Quality:  {BHH PARTICIPATION QUALITY:22265}  Affect:  {BHH AFFECT:22266}  Cognitive:  {BHH COGNITIVE:22267}  Insight: {BHH Insight2:20797}  Engagement in Group:  {BHH ENGAGEMENT IN JY:[id number]  Modes of Intervention:  {BHH MODES OF INTERVENTION:22269}  Additional Comments:  ***  Debe Coder 12/29/2022, 11:08 PM

## 2022-12-29 NOTE — BHH Suicide Risk Assessment (Signed)
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[place]) Active Problems:   Alcohol-induced mood disorder ([place])   Alcohol use disorder, severe, dependence ([place])   MDD (major depressive disorder)   Cocaine use disorder ([place])  Subjective Data:  History of Present Illness:  Patient is a 52 year old male with a reported psychiatric history of bipolar disorder and anxiety disorder, who was admitted to the [facility] 12/28/22 after initially presenting to [place] 12/28/22 0500 voluntarily via law enforcement for passive suicidal ideation after being discharged from [facility] 12/27/2022 for worsening depression, suicidal thoughts, and alcohol detox. He was assessed and determined not to meet admission criteria then discharged with substance abuse resources. He later presented [facility] with similar complaints where he was assessed and cleared for psychiatric admissions.    24 hr chart review: Patient arrived on the unit overnight where he has remained in his room receiving meals, medications, and not participating in any unit groups or activities.    Assessment: Patient assessed in his room where he was observed laying in bed asleep. He presents slightly irritable mood with congruent affect, brightens on approach. He reports reason for readmission was due to 'being upset when I left so I drank a case of beer and smoked some cocaine'. States he is now 'coming around' and has a plan to return to [facility] in time  for court Thursday related to an open container (alcohol) misdemeanor case so he can go to [place] to complete program. 'I got sidetracked and they brought me back here'. He describes feeling 'irritable' related to 'coming off the cocaine' denies any agitation, active cravings, or other withdrawal symptoms. He denies any active SI/HI/AVH. He is inquiring about 72 hour forms as he 'doesn't plan to be here long, I just have to get past the feeling'. He reports plan to return to [place] to live where he has secured housing. He denies any safety concerns at this time. Most recent medications restarted.    Per H&P 12/24/2022:  Past psychiatric history: Patient reports being diagnosed with bipolar disorder and anxiety in the past.  Patient reports history of multiple psychiatric hospitalizations. Patient denies a history of any suicide attempts.  Patient reports not taking any medications for quite some time but is unable to specify if this is a months or years.  Reports taking Seroquel in the past.  Denies knowledge of other medication trials but reports he has taken other psychiatric medications in the past.   Continued Clinical Symptoms:  Alcohol Use Disorder Identification Test Final Score (AUDIT): 31 The "Alcohol Use Disorders Identification Test", Guidelines for Use in Primary Care, Second Edition.  World Pharmacologist [facility]). Score between 0-7:  no or low risk or alcohol related problems. Score between 8-15:  moderate risk of alcohol related problems. Score between 16-19:  high risk of alcohol related problems. Score 20 or above:  warrants further diagnostic evaluation for alcohol dependence and treatment.   CLINICAL FACTORS:   Alcohol/Substance Abuse/Dependencies More than one psychiatric diagnosis Previous Psychiatric Diagnoses and Treatments   Musculoskeletal: Strength & Muscle Tone:  within normal limits Gait & Station: normal Patient leans: N/A  Psychiatric Specialty  Exam:  Presentation  General Appearance:  Casual; Appropriate for Environment  Eye Contact: Fair  Speech: Clear and Coherent  Speech Volume: Normal  Handedness: Right   Mood and Affect  Mood: Euthymic  Affect: Appropriate; Congruent   Thought Process  Thought Processes: Coherent  Descriptions of Associations:Intact  Orientation:Full (Time, Place and Person)  Thought Content:Logical  History of Schizophrenia/Schizoaffective disorder:No  Duration of Psychotic Symptoms:No data recorded Hallucinations:Hallucinations: None  Ideas of Reference:None  Suicidal Thoughts:Suicidal Thoughts: No SI Active Intent and/or Plan: With Intent; With Plan  Homicidal Thoughts:Homicidal Thoughts: No   Sensorium  Memory: Immediate Good; Recent Fair  Judgment: Fair  Insight: Fair   Materials engineer: Fair  Attention Span: Fair  Recall: [employer] of Knowledge: Fair  Language: Fair   Psychomotor Activity  Psychomotor Activity: Psychomotor Activity: Normal   Assets  Assets: Armed forces logistics/support/administrative officer; Physical Health; Resilience   Sleep  Sleep: Sleep: Fair    Physical Exam: Physical Exam Psychiatric:        Attention and Perception: Attention and perception normal.        Mood and Affect: Mood and affect normal.        Speech: Speech normal.        Behavior: Behavior normal. Behavior is cooperative.        Thought Content: Thought content normal. Thought content is not paranoid or delusional. Thought content does not include homicidal or suicidal ideation. Thought content does not include homicidal or suicidal plan.        Cognition and Memory: Cognition and memory normal.        Judgment: Judgment normal.    Review of Systems  Psychiatric/Behavioral:  Positive for substance abuse.   All other systems reviewed and are negative.  Blood pressure (!) 120/92, pulse (!) 105, temperature 98.3 F (36.8 C), temperature source  Oral, resp. rate 16, height '6\' 2"'$  (1.88 m), weight 75.3 kg, SpO2 100 %. Body mass index is 21.31 kg/m.   COGNITIVE FEATURES THAT CONTRIBUTE TO RISK:  None    SUICIDE RISK:   Mild:  Suicidal ideation of limited frequency, intensity, duration, and specificity.  There are no identifiable plans, no associated intent, mild dysphoria and related symptoms, good self-control (both objective and subjective assessment), few other risk factors, and identifiable protective factors, including available and accessible social support.  PLAN OF CARE:  PLAN: Safety and Monitoring:             --  Voluntary admission to inpatient psychiatric unit for safety, stabilization and  treatment             -- Daily contact with patient to assess and evaluate symptoms and progress in  treatment             -- Patient's case to be discussed in multi-disciplinary team meeting             -- Observation Level : q15 minute checks             -- Vital signs:  q12 hours             -- Precautions: suicide, elopement, and assault   2. Psychiatric Diagnoses and Treatment:  Medications:  Start:  - Seroquel XR 50 mg nightly.   - Seroquel IR 50 mg nightly as needed-for insomnia and treatment of mood disorder.  Patient reports Seroquel worked well in the past for  mood disorder and insomnia. - fluoxetine 20 mg once daily for MDD and GAD - gabapentin 100 mg 3 times daily -alcohol use disorder and anxiety in context of substance use disorder. -CIWA protocol with scheduled Ativan taper for alcohol use disorder and alcohol withdrawal   PRNs  - Haldol PO and IM PRN for agitation protocol - Trazodone 50 mg HS PRN insomnia - Milk of Magnesia 30 mL daily PRN mild constipation - Hydroxyzine 25 mg TID PRN anxiety - Diphenhydramine 50 mg IM TID PRN agitation - Acetaminophen 650 mg PO q6hrs PRN mild pain - Maalox/Mylanta 30 mL q4hrs PRN indigestion   --  The risks/benefits/side-effects/alternatives to this medication were  discussed in detail with the patient and time was given for questions. The patient consents to medication trial.                -- Metabolic profile and EKG monitoring obtained while on an atypical antipsychotic  (BMI: Lipid Panel: HbgA1c: QTc:)                -- Encouraged patient to participate in unit milieu and in scheduled group therapies                -- Short Term Goals: Ability to identify changes in lifestyle to reduce recurrence of  condition will improve, Ability to verbalize feelings will improve, Ability to disclose and  discuss suicidal ideas, Ability to demonstrate self-control will improve, Ability to identify and develop effective coping behaviors will improve, Ability to maintain clinical measurements within normal limits will improve, Compliance with prescribed medications will improve, and Ability to identify triggers associated with substance abuse/mental health issues will improve               -- Long Term Goals: Improvement in symptoms so as ready for discharge                3. Medical Issues Being Addressed:              Tobacco Use Disorder             -- Nicotine patch 14 mg/24 hours ordered             -- Nicorette gum             -- Smoking cessation encouraged   4. Discharge Planning:              -- Social work and case management to assist with discharge planning and identification of hospital follow-up needs prior to discharge             -- Estimated LOS: 5-7 days             -- Discharge Concerns: Need to establish a safety plan; Medication compliance and effectiveness             -- Discharge Goals: Return home with outpatient referrals for mental health follow-up including medication management/psychotherapy     Observation Level/Precautions:  15 minute checks  Laboratory:  CBC Chemistry Profile Folic Acid GGT HCG UDS UA UDS+ cocaine, BAL 263  Psychotherapy:  group, milieu  Medications:  see MAR  Consultations:   social work  Discharge  Concerns:   safety  Estimated LOS:   3-5 days  Other:      Physician Treatment Plan for Primary Diagnosis: 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[place]) Long Term Goal(s): Improvement in symptoms so as ready for discharge   Short Term Goals: Ability to identify changes in  lifestyle to reduce recurrence of condition will improve, Ability to verbalize feelings will improve, Ability to disclose and discuss suicidal ideas, Ability to demonstrate self-control will improve, Ability to identify and develop effective coping behaviors will improve, Ability to maintain clinical measurements within normal limits will improve, Compliance with prescribed medications will improve, and Ability to identify triggers associated with substance abuse/mental health issues will improve   Physician Treatment Plan for Secondary Diagnosis: Principal Problem:   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[place]) Active Problems:   Alcohol-induced mood disorder ([place])   Alcohol use disorder, severe, dependence ([place])   MDD (major depressive disorder)   Cocaine use disorder ([place])   Long Term Goal(s): Improvement in symptoms so as ready for discharge   Short Term Goals: Ability to identify changes in lifestyle to reduce recurrence of condition will improve, Ability to verbalize feelings will improve, Ability to disclose and discuss suicidal ideas, Ability to demonstrate self-control will improve, Ability to identify and develop effective coping behaviors will improve, Ability to maintain clinical measurements within normal limits will improve, Compliance with prescribed medications will improve, and Ability to identify triggers associated with substance abuse/mental health issues will improve  I certify that inpatient services furnished can reasonably be expected to improve the patient's condition.   Inda Merlin, NP 12/29/2022, 2:59 PM

## 2022-12-29 NOTE — Progress Notes (Addendum)
D. Pt presents with a sad affect and calm, cooperative behavior. Per pt's self inventory, pt rated his depression,hopelessness and anxiety a 0/0/2, respectively. Pt's stated goal today is "to stop drinking"- pt remained in his room for the majority of the shift, did not attend groups- minimal interaction with peers. Pt currently denies pain, withdrawal symptoms, SI/HI and AVH  A. Pt given and educated on medications. Pt supported emotionally and encouraged to express concerns and ask questions.   R. Pt remains safe with 15 minute checks. Will continue POC.    12/29/22 1200  Psych Admission Type (Psych Patients Only)  Admission Status Voluntary  Psychosocial Assessment  Patient Complaints None  Eye Contact Fair  Facial Expression Sad  Affect Appropriate to circumstance  Speech Logical/coherent  Interaction Assertive  Motor Activity Other (Comment) (steady gait)  Appearance/Hygiene Unremarkable  Behavior Characteristics Cooperative;Appropriate to situation  Mood Preoccupied;Sad  Thought Process  Coherency WDL  Content WDL  Delusions None reported or observed  Perception WDL  Hallucination None reported or observed  Judgment Impaired  Confusion None  Danger to Self  Current suicidal ideation? Denies  Description of Suicide Plan no plan  Agreement Not to Harm Self Yes  Description of Agreement verbal contract for safety  Danger to Others  Danger to Others None reported or observed

## 2022-12-30 ENCOUNTER — Encounter (HOSPITAL_COMMUNITY): Payer: Self-pay

## 2022-12-30 LAB — HEMOGLOBIN A1C
Hgb A1c MFr Bld: 4.9 % (ref 4.8–5.6)
Mean Plasma Glucose: 94 mg/dL

## 2022-12-30 MED ORDER — LORAZEPAM 1 MG PO TABS
1.0000 mg | ORAL_TABLET | Freq: Four times a day (QID) | ORAL | Status: DC | PRN
  Administered 2022-12-30: 1 mg via ORAL
  Filled 2022-12-30: qty 1

## 2022-12-30 MED ORDER — LORAZEPAM 2 MG/ML IJ SOLN: 2.0000 mg | Freq: Three times a day (TID) | INTRAMUSCULAR | Status: DC | PRN

## 2022-12-30 MED ORDER — LORAZEPAM 1 MG PO TABS: 2.0000 mg | ORAL_TABLET | Freq: Three times a day (TID) | ORAL | Status: DC | PRN

## 2022-12-30 MED ORDER — HALOPERIDOL LACTATE 5 MG/ML IJ SOLN: 5.0000 mg | Freq: Three times a day (TID) | INTRAMUSCULAR | Status: DC | PRN

## 2022-12-30 MED ORDER — QUETIAPINE FUMARATE ER 50 MG PO TB24
100.0000 mg | ORAL_TABLET | Freq: Every day | ORAL | Status: DC
  Administered 2022-12-30 – 2022-12-31 (×2): 100 mg via ORAL
  Filled 2022-12-30 (×3): qty 2

## 2022-12-30 MED ORDER — DIPHENHYDRAMINE HCL 25 MG PO CAPS: 50.0000 mg | ORAL_CAPSULE | Freq: Three times a day (TID) | ORAL | Status: DC | PRN

## 2022-12-30 MED ORDER — QUETIAPINE FUMARATE 50 MG PO TABS: 50.0000 mg | ORAL_TABLET | Freq: Every evening | ORAL | Status: DC | PRN

## 2022-12-30 MED ORDER — DIPHENHYDRAMINE HCL 50 MG/ML IJ SOLN: 50.0000 mg | Freq: Three times a day (TID) | INTRAMUSCULAR | Status: DC | PRN

## 2022-12-30 MED ORDER — HALOPERIDOL 5 MG PO TABS: 5.0000 mg | ORAL_TABLET | Freq: Three times a day (TID) | ORAL | Status: DC | PRN

## 2022-12-30 NOTE — BHH Group Notes (Signed)
Adult Psychoeducational Group Note  Date:  12/30/2022 Time:  3:28 PM  Group Topic/Focus:  Social wellness  Participation Level:  Did Not Attend  Participation Quality:   Did not attend  Affect:   Did not attend  Cognitive:   Did not attend  Insight: None  Engagement in Group:   Did not attend  Modes of Intervention:   Did not attend  Additional Comments:  Pt did not attend social wellness group due to  being in bed.  Azalie Harbeck, Georgiann Mccoy 12/30/2022, 3:28 PM

## 2022-12-30 NOTE — [facility] Counselor (Signed)
Adult Comprehensive Assessment  Patient ID: Chad Sanchez, male   DOB: 1971/03/20, 52 y.o.   MRN: [id number]  Information Source: Information source: Patient  Current Stressors:  Patient states their primary concerns and needs for treatment are:: pt reports ETOH dependence daily consumption of approximately 15 Beers and state that he also smokes Cocaine 3x monthly Patient states their goals for this hospitilization and ongoing recovery are:: Detox/Housing resources Educational / Learning stressors: none reported Employment / Job issues: pt expressed concerns about being unsure if he is still employed with [employer] in [place] and attributes this recently relapsing Family Relationships: pt denies being connected with all of his family members Museum/gallery curator / Lack of resources (include bankruptcy): denies Housing / Lack of housing: pt reports being homeless and residing at a shelter in [place]. pt is concerned about his personal belongings, stating"I relapsed and forgot to call them about my things" Physical health (include injuries & life threatening diseases): none reported Social relationships: denies Substance abuse: Daily ETOH dependence 12-15 Beers daily Cocaine 3x per month Bereavement / Loss: denies  Living/Environment/Situation:  Living Arrangements: Other (Comment) Living conditions (as described by patient or guardian): homeless Who else lives in the home?: n/a How long has patient lived in current situation?: 3 months What is atmosphere in current home: Temporary  Family History:  Marital status: Single Are you sexually active?: No What is your sexual orientation?: straight Has your sexual activity been affected by drugs, alcohol, medication, or emotional stress?: denies Does patient have children?: No  Childhood History:  By whom was/is the patient raised?: Adoptive parents Additional childhood history information: I was adopted when I was 61 weeks  old Description of patient's relationship with caregiver when they were a child: "I had a normal relationship with my parents." "I was close with my biological parents as well." Patient's description of current relationship with people who raised him/her: the same normal How were you disciplined when you got in trouble as a child/adolescent?: spankings Does patient have siblings?: Yes Number of Siblings: 13 Description of patient's current relationship with siblings: unknown Did patient suffer any verbal/emotional/physical/sexual abuse as a child?: No Did patient suffer from severe childhood neglect?: No Has patient ever been sexually abused/assaulted/raped as an adolescent or adult?: No Was the patient ever a victim of a crime or a disaster?: No Witnessed domestic violence?: No Has patient been affected by domestic violence as an adult?: No  Education:  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 Currently a student?: No Learning disability?: No  Employment/Work Situation:   Employment Situation: Employed Where is Patient Currently Employed?: [employer], [place] has Patient Been Employed?: "a few years off and on" Are You Satisfied With Your Job?: Yes Do You Work More Than One Job?: No Work Stressors: Drinking Patient's Job has Been Impacted by Current Illness: No Describe how Patient's Job has Been Impacted: n/a What is the Longest Time Patient has Held a Job?: 4 years Where was the Patient Employed at that Time?: [employer] while in program Has Patient ever Been in the [employer]?: No  Financial Resources:   Financial resources: Income from employment Does patient have a representative payee or guardian?: No  Alcohol/Substance Abuse:   What has been your use of drugs/alcohol within the last 12 months?: ETOH 12-15 Beers daily Cocaine 3x per month If attempted suicide, did drugs/alcohol play a role in this?: No Alcohol/Substance Abuse Treatment  Hx: Past Tx, Inpatient, Attends AA/NA, Past  Tx, Outpatient, Substance abuse evaluation, Past detox If yes, describe treatment: pt has been in several Tx facilities to include; [facility], [facility] and [facility] Has alcohol/substance abuse ever caused legal problems?: No  Social Support System:   Patient's Community Support System: Poor Describe Community Support System: "I am from [place] and I live in [place]. I have tried most of the places in the area and I'm still drinking" Type of faith/religion: Darrick Meigs How does patient's faith help to cope with current illness?: "I try to stick to having a plan"  Leisure/Recreation:   Do You Have Hobbies?: No Leisure and Hobbies: n/a  Strengths/Needs:   What is the patient's perception of their strengths?: I am resilient Patient states they can use these personal strengths during their treatment to contribute to their recovery: "I just keep trying" Patient states these barriers may affect/interfere with their treatment: none reported Patient states these barriers may affect their return to the community: transportation/homelessness  Discharge Plan:   Currently receiving community mental health services: No Patient states concerns and preferences for aftercare planning are: none reported Patient states they will know when they are safe and ready for discharge when: "I don't know" Does patient have access to transportation?: No Does patient have financial barriers related to discharge medications?: Yes Patient description of barriers related to discharge medications: I don't have any money Plan for no access to transportation at discharge: public transportation Will patient be returning to same living situation after discharge?: Yes  Summary/Recommendations:   Summary and Recommendations (to be completed by the evaluator): pt is a 52 y.o. male patient admitted with previous hx of Alcoholism Depression and Cocaine use Disorder .came in this  evening seeking detox treatment from Alcohol and feeling suicidal. Patient was discharged from [facility] ER early this morning and returned to [facility] ER. Pt provided past Tx history and asserts that he has received Tx at [facility], [place], [place] and [place] and states that he desires to return to [place] following discharge, in an attempt to salvage his employment at [employer]). Pt does not desire in patient Tx currently.  Patient was readmitted on 12/28/22 and denies any changes.  He reports that he is going [place] on Friday and will need a bus pass upon discharge. He denies follow ups.  While here, [place] can benefit from crisis stabilization, medication management, therapeutic milieu, and referrals for services.   Lubertha [place]. 12/30/2022

## 2022-12-30 NOTE — Plan of Care (Signed)
  Problem: Activity: Goal: Interest or engagement in activities will improve Outcome: Progressing   Problem: Safety: Goal: Periods of time without injury will increase Outcome: Progressing   Problem: Activity: Goal: Interest or engagement in leisure activities will improve Outcome: Progressing

## 2022-12-30 NOTE — Group Note (Signed)
Recreation Therapy Group Note   Group Topic:Stress Management  Group Date: 12/30/2022 Start Time: 0930 End Time: 0950 Facilitators: Giovannie Scerbo-McCall, LRT,CTRS Location: [address] Dayroom   Goal Area(s) Addresses:  Patient will identify positive stress management techniques. Patient will identify benefits of using stress management post d/c.  Group Description:  Meditation.  LRT and patients discussed meditation and what it in tales.  LRT then explained to patients the group setting.  LRT played a meditation that focused on starting the day motivated and confident.  Patients were to listen and follow along as meditation played to fully engage in the process.   Affect/Mood: N/A   Participation Level: Did not attend    Clinical Observations/Individualized Feedback:     Plan: Continue to engage patient in RT group sessions 2-3x/week.   Rion Schnitzer-McCall, LRT,CTRS 12/30/2022 12:44 PM

## 2022-12-30 NOTE — [facility] Group Notes (Signed)
Pt did not attend A/A

## 2022-12-30 NOTE — [facility] Group Notes (Signed)
Spiritual care group on grief and loss facilitated by Chaplain Janne Napoleon, Bcc and Lysle Morales, counseling intern.  Group Goal: Support / Education around grief and loss  Members engage in facilitated group support and psycho-social education.  Group Description:  Following introductions and group rules, group members engaged in facilitated group dialogue and support around topic of loss, with particular support around experiences of loss in their lives. Group Identified types of loss (relationships / self / things) and identified patterns, circumstances, and changes that precipitate losses. Reflected on thoughts / feelings around loss, normalized grief responses, and recognized variety in grief experience. Group encouraged individual reflection on safe space and on the coping skills that they are already utilizing.  Group drew on Adlerian / Rogerian and narrative framework  Patient Progress: Did not attend.

## 2022-12-30 NOTE — Progress Notes (Signed)
Patient ID: Chad Sanchez, male   DOB: Jul 27, 1971, 52 y.o.   MRN: [id number]   Pt presented at the medication window saying he was "irritable." Pt was asked what was causing him to be irritable. Pt stated, "I don't know, maybe because I'm withdrawing." Pt was offered medication. Hydroxyzine 25 mg PRN was given.

## 2022-12-30 NOTE — BH IP Treatment Plan (Signed)
Interdisciplinary Treatment and Diagnostic Plan Update  12/30/2022 Time of Session: 10:40am Chad Sanchez MRN: [id number]  Principal Diagnosis: MDD (major depressive disorder), recurrent severe, without psychosis ([place])  Secondary Diagnoses: Principal Problem:   MDD (major depressive disorder), recurrent severe, without psychosis ([place]) Active Problems:   GAD (generalized anxiety disorder)   Alcohol use disorder, severe, dependence ([place])   Cocaine use disorder ([place])   Current Medications:  Current Facility-Administered Medications  Medication Dose Route Frequency Provider Last Rate Last Admin   acetaminophen (TYLENOL) tablet 650 mg  650 mg Oral Q6H PRN Motley-Mangrum, Jadeka A, PMHNP       alum & mag hydroxide-simeth (MAALOX/MYLANTA) 200-200-20 MG/5ML suspension 30 mL  30 mL Oral Q4H PRN Motley-Mangrum, Jadeka A, PMHNP       haloperidol (HALDOL) tablet 5 mg  5 mg Oral TID PRN Massengill, Ovid Curd, MD       And   LORazepam (ATIVAN) tablet 2 mg  2 mg Oral TID PRN Massengill, Ovid Curd, MD       And   diphenhydrAMINE (BENADRYL) capsule 50 mg  50 mg Oral TID PRN Massengill, Ovid Curd, MD       haloperidol lactate (HALDOL) injection 5 mg  5 mg Intramuscular TID PRN Massengill, Ovid Curd, MD       And   LORazepam (ATIVAN) injection 2 mg  2 mg Intramuscular TID PRN Massengill, Ovid Curd, MD       And   diphenhydrAMINE (BENADRYL) injection 50 mg  50 mg Intramuscular TID PRN Massengill, Ovid Curd, MD       FLUoxetine (PROZAC) capsule 20 mg  20 mg Oral Daily Motley-Mangrum, Jadeka A, PMHNP   20 mg at 12/30/22 0745   gabapentin (NEURONTIN) capsule 100 mg  100 mg Oral BID Motley-Mangrum, Jadeka A, PMHNP   100 mg at 12/30/22 0745   hydrOXYzine (ATARAX) tablet 25 mg  25 mg Oral TID PRN Motley-Mangrum, Jadeka A, PMHNP   25 mg at 12/30/22 0745   LORazepam (ATIVAN) injection 1-4 mg  1-4 mg Intravenous Q1H PRN Motley-Mangrum, Jadeka A, PMHNP       LORazepam (ATIVAN) tablet 2 mg  2 mg Oral TID PRN Motley-Mangrum,  Jadeka A, PMHNP       magnesium hydroxide (MILK OF MAGNESIA) suspension 30 mL  30 mL Oral Daily PRN Motley-Mangrum, Jadeka A, PMHNP       multivitamin with minerals tablet 1 tablet  1 tablet Oral Daily Motley-Mangrum, Jadeka A, PMHNP   1 tablet at 12/30/22 0745   nicotine (NICODERM CQ - dosed in mg/24 hr) patch 7 mg  7 mg Transdermal Daily Leevy-Johnson, Brooke A, NP       nicotine polacrilex (NICORETTE) gum 2 mg  2 mg Oral PRN Leevy-Johnson, Brooke A, NP       QUEtiapine (SEROQUEL XR) 24 hr tablet 50 mg  50 mg Oral QHS Motley-Mangrum, Jadeka A, PMHNP   50 mg at 12/29/22 2114   traZODone (DESYREL) tablet 50 mg  50 mg Oral QHS PRN Motley-Mangrum, Jadeka A, PMHNP   50 mg at 12/29/22 2115   PTA Medications: Medications Prior to Admission  Medication Sig Dispense Refill Last Dose   FLUoxetine (PROZAC) 20 MG capsule Take 1 capsule (20 mg total) by mouth daily. 14 capsule 0    gabapentin (NEURONTIN) 100 MG capsule Take 1 capsule (100 mg total) by mouth 2 (two) times daily for 14 days. 28 capsule 0    Multiple Vitamin (MULTIVITAMIN WITH MINERALS) TABS tablet Take 1 tablet by mouth daily. (Patient  not taking: Reported on 12/23/2022)      nicotine (NICODERM CQ - DOSED IN MG/24 HOURS) 14 mg/24hr patch Place 1 patch (14 mg total) onto the skin daily. (Patient not taking: Reported on 12/28/2022) 28 patch 0    QUEtiapine (SEROQUEL XR) 50 MG TB24 24 hr tablet Take 1 tablet (50 mg total) by mouth at bedtime for 14 days. 14 tablet 0    thiamine (VITAMIN B-1) 100 MG tablet Take 1 tablet (100 mg total) by mouth daily.       Patient Stressors: Financial difficulties   Substance abuse    Patient Strengths: Active sense of humor  Average or above average intelligence  Communication skills  Physical Health   Treatment Modalities: Medication Management, Group therapy, Case management,  1 to 1 session with clinician, Psychoeducation, Recreational therapy.   Physician Treatment Plan for Primary Diagnosis: MDD  (major depressive disorder), recurrent severe, without psychosis ([place]) Long Term Goal(s): Improvement in symptoms so as ready for discharge   Short Term Goals: Ability to identify changes in lifestyle to reduce recurrence of condition will improve Ability to verbalize feelings will improve Ability to disclose and discuss suicidal ideas Ability to demonstrate self-control will improve Ability to identify and develop effective coping behaviors will improve Ability to maintain clinical measurements within normal limits will improve Compliance with prescribed medications will improve Ability to identify triggers associated with substance abuse/mental health issues will improve  Medication Management: Evaluate patient's response, side effects, and tolerance of medication regimen.  Therapeutic Interventions: 1 to 1 sessions, Unit Group sessions and Medication administration.  Evaluation of Outcomes: Progressing  Physician Treatment Plan for Secondary Diagnosis: Principal Problem:   MDD (major depressive disorder), recurrent severe, without psychosis ([place]) Active Problems:   GAD (generalized anxiety disorder)   Alcohol use disorder, severe, dependence ([place])   Cocaine use disorder ([place])  Long Term Goal(s): Improvement in symptoms so as ready for discharge   Short Term Goals: Ability to identify changes in lifestyle to reduce recurrence of condition will improve Ability to verbalize feelings will improve Ability to disclose and discuss suicidal ideas Ability to demonstrate self-control will improve Ability to identify and develop effective coping behaviors will improve Ability to maintain clinical measurements within normal limits will improve Compliance with prescribed medications will improve Ability to identify triggers associated with substance abuse/mental health issues will improve     Medication Management: Evaluate patient's response, side effects, and tolerance of medication  regimen.  Therapeutic Interventions: 1 to 1 sessions, Unit Group sessions and Medication administration.  Evaluation of Outcomes: Progressing   RN Treatment Plan for Primary Diagnosis: MDD (major depressive disorder), recurrent severe, without psychosis ([place]) Long Term Goal(s): Knowledge of disease and therapeutic regimen to maintain health will improve  Short Term Goals: Ability to remain free from injury will improve, Ability to verbalize frustration and anger appropriately will improve, Ability to demonstrate self-control, Ability to participate in decision making will improve, Ability to verbalize feelings will improve, Ability to disclose and discuss suicidal ideas, Ability to identify and develop effective coping behaviors will improve, and Compliance with prescribed medications will improve  Medication Management: RN will administer medications as ordered by provider, will assess and evaluate patient's response and provide education to patient for prescribed medication. RN will report any adverse and/or side effects to prescribing provider.  Therapeutic Interventions: 1 on 1 counseling sessions, Psychoeducation, Medication administration, Evaluate responses to treatment, Monitor vital signs and CBGs as ordered, Perform/monitor CIWA, COWS, AIMS and Fall Risk screenings  as ordered, Perform wound care treatments as ordered.  Evaluation of Outcomes: Progressing   LCSW Treatment Plan for Primary Diagnosis: MDD (major depressive disorder), recurrent severe, without psychosis ([place]) Long Term Goal(s): Safe transition to appropriate next level of care at discharge, Engage patient in therapeutic group addressing interpersonal concerns.  Short Term Goals: Engage patient in aftercare planning with referrals and resources, Increase social support, Increase ability to appropriately verbalize feelings, Increase emotional regulation, Facilitate acceptance of mental health diagnosis and concerns,  Facilitate patient progression through stages of change regarding substance use diagnoses and concerns, Identify triggers associated with mental health/substance abuse issues, and Increase skills for wellness and recovery  Therapeutic Interventions: Assess for all discharge needs, 1 to 1 time with Social worker, Explore available resources and support systems, Assess for adequacy in community support network, Educate family and significant other(s) on suicide prevention, Complete Psychosocial Assessment, Interpersonal group therapy.  Evaluation of Outcomes: Progressing   Progress in Treatment: Attending groups: No. Participating in groups: No. Taking medication as prescribed: Yes. Toleration medication: Yes. Family/Significant other contact made: No, will contact:  patient declined consents Patient understands diagnosis: Yes. Discussing patient identified problems/goals with staff: Yes. Medical problems stabilized or resolved: Yes. Denies suicidal/homicidal ideation: Yes. Issues/concerns per patient self-inventory: No.   New problem(s) identified: No, Describe:  none reported  New Short Term/Long Term Goal(s):   medication stabilization, elimination of SI thoughts, development of comprehensive mental wellness plan.    Patient Goals:  Pt states that he needs medication stabilization.  Patient would like to get to [facility] to complete program at [place].   Discharge Plan or Barriers: Patient recently admitted. [name] will continue to follow and assess for appropriate referrals and possible discharge planning.    Reason for Continuation of Hospitalization: Anxiety Medication stabilization Suicidal ideation  Estimated Length of Stay: 1-3 days  Last 3 [place] Suicide Severity Risk Score: [place] Admission (Current) from 12/28/2022 in [facility] Most recent reading at 12/29/2022 12:14 AM ED from 12/28/2022 in [facility] Emergency Department at [facility] Most recent reading at 12/28/2022  7:13 AM ED from 12/28/2022 in [facility] Most recent reading at 12/28/2022  5:04 AM  C-SSRS RISK CATEGORY High Risk High Risk Low Risk       Last PHQ 2/9 Scores:     No data to display          Scribe for Treatment Team: Zachery Conch, LCSW 12/30/2022 10:31 AM

## 2022-12-30 NOTE — Progress Notes (Signed)
[facility] MD Progress Note  12/30/2022 3:13 PM Chad Sanchez  MRN:  [id number] Principal Problem: MDD (major depressive disorder), recurrent severe, without psychosis ([place]) Diagnosis: Principal Problem:   MDD (major depressive disorder), recurrent severe, without psychosis ([place]) Active Problems:   GAD (generalized anxiety disorder)   Alcohol use disorder, severe, dependence ([place])   Cocaine use disorder ([place])  Reason for admission: Patient is a 52 year old male with a reported psychiatric history of bipolar disorder and anxiety disorder, who was admitted to the [facility] 12/28/22 after initially presenting to [place] 12/28/22 0500 voluntarily via law enforcement for passive suicidal ideation after being discharged from [facility] 12/27/2022 for worsening depression, suicidal thoughts, and alcohol detox. He was assessed and determined not to meet admission criteria then discharged with substance abuse resources. He later presented [facility] with similar complaints where he was assessed and cleared for psychiatric admissions.    24 hr chart review: Last vital signs on patient weere on 02/25, patient's assigned RN notified of her need of repeated vital signs.  Patient is compliant with scheduled medications, required trazodone 50 mg last night for sleep, along with hydroxyzine 25 mg for anxiety, also required hydroxyzine 25 mg today afternoon.As per nursing documentation, patient signed a 72-hour notice for discharge on 12/29/2022 at 1612.  As per nursing documentation, he verbalized feeling irritable earlier today morning to his assigned RN, but no behavioral episodes noted in the last 24 hours.  Patient assessment note 12/30/2022: Mood today is irritable and depressed, patient denies SI/HI/AVH, there is no evidence of delusional thinking, and patient denies paranoia.  Patient irritable earlier today morning when asked to exit room to go to treatment team meeting, but was able to comply with directions. His attention to  personal hygiene and grooming is fair, eye contact is good, speech is clear & coherent. Thought contents are organized and logical.  Patient reports that his sleep quality last night was poor, he states that he typically takes Seroquel 300 mg at home, but was only given 50 mg last night.  He reports a good appetite, reports generalized body pains and soreness, educated to [facility] assigned RN for pain medications.  He reports a normal appetite, states that he is eating all meals, denies all medication related side effects.  Denies any problems moving his bowels.  Writer discussed with patient his alcohol use, and he states that he was drinking a case of beer daily prior to hospitalization.  He states that after discharge during his last hospitalization, he drank for 1 day prior to returning for this admission.  Patient states that he not need a detox protocol at this time because he wants to leave the hospital on Wednesday 2/28 due to having court in [place].  As needed protocol from medical unit discontinued, and as needed Ativan 1 mg for CIWA scores greater than 10 ordered. We are increasing patient's Seroquel to 100 mg XR nightly, along with 50 mg IR if needed for sleep.  We will continue other medications as listed below.  Total Time spent with patient: 45 minutes  Past Psychiatric History: As below  Past Medical History:  Past Medical History:  Diagnosis Date   Alcohol abuse    Anxiety    Depression    Hypertension    Sickle cell anemia (HCC)     Past Surgical History:  Procedure Laterality Date   TONSILLECTOMY     Family History:  Family History  Problem Relation Age of Onset   Cirrhosis Mother  Family Psychiatric  History: See H & P Social History:  Social History   Substance and Sexual Activity  Alcohol Use Yes   Alcohol/week: 12.0 standard drinks of alcohol   Types: 12 Cans of beer per week   Comment: a case a day; pt told this writer 12 pk daily or a fifth of vodka      Social History   Substance and Sexual Activity  Drug Use Yes   Types: "Crack" cocaine, Cocaine   Comment: last used 2 weeks ago    Social History   Socioeconomic History   Marital status: Single    Spouse name: Not on file   Number of children: Not on file   Years of education: Not on file   Highest education level: Not on file  Occupational History   Not on file  Tobacco Use   Smoking status: Every Day    Packs/day: 0.50    Types: Cigarettes   Smokeless tobacco: Never  Vaping Use   Vaping Use: Never used  Substance and Sexual Activity   Alcohol use: Yes    Alcohol/week: 12.0 standard drinks of alcohol    Types: 12 Cans of beer per week    Comment: a case a day; pt told this writer 12 pk daily or a fifth of vodka   Drug use: Yes    Types: "Crack" cocaine, Cocaine    Comment: last used 2 weeks ago   Sexual activity: Not Currently  Other Topics Concern   Not on file  Social History Narrative   Not on file   Social Determinants of Health   Financial Resource Strain: Not on file  Food Insecurity: Food Insecurity Present (12/29/2022)   Hunger Vital Sign    Worried About Running Out of Food in the Last Year: Sometimes true    Ran Out of Food in the Last Year: Sometimes true  Transportation Needs: No Transportation Needs (12/29/2022)   PRAPARE - Hydrologist (Medical): No    Lack of Transportation (Non-Medical): No  Physical Activity: Not on file  Stress: Not on file  Social Connections: Not on file   Sleep: Poor  Appetite:  Good  Current Medications: Current Facility-Administered Medications  Medication Dose Route Frequency Provider Last Rate Last Admin   acetaminophen (TYLENOL) tablet 650 mg  650 mg Oral Q6H PRN Motley-Mangrum, Jadeka A, PMHNP       alum & mag hydroxide-simeth (MAALOX/MYLANTA) 200-200-20 MG/5ML suspension 30 mL  30 mL Oral Q4H PRN Motley-Mangrum, Jadeka A, PMHNP       haloperidol (HALDOL) tablet 5 mg  5 mg Oral TID  PRN Massengill, Ovid Curd, MD       And   diphenhydrAMINE (BENADRYL) capsule 50 mg  50 mg Oral TID PRN Massengill, Ovid Curd, MD       haloperidol lactate (HALDOL) injection 5 mg  5 mg Intramuscular TID PRN Massengill, Ovid Curd, MD       And   diphenhydrAMINE (BENADRYL) injection 50 mg  50 mg Intramuscular TID PRN Massengill, Ovid Curd, MD       FLUoxetine (PROZAC) capsule 20 mg  20 mg Oral Daily Motley-Mangrum, Jadeka A, PMHNP   20 mg at 12/30/22 0745   gabapentin (NEURONTIN) capsule 100 mg  100 mg Oral BID Motley-Mangrum, Jadeka A, PMHNP   100 mg at 12/30/22 0745   hydrOXYzine (ATARAX) tablet 25 mg  25 mg Oral TID PRN Motley-Mangrum, Jadeka A, PMHNP   25 mg at 12/30/22 0745  LORazepam (ATIVAN) tablet 1 mg  1 mg Oral Q6H PRN Nicholes Rough, NP   1 mg at 12/30/22 1502   magnesium hydroxide (MILK OF MAGNESIA) suspension 30 mL  30 mL Oral Daily PRN Motley-Mangrum, Jadeka A, PMHNP       multivitamin with minerals tablet 1 tablet  1 tablet Oral Daily Motley-Mangrum, Jadeka A, PMHNP   1 tablet at 12/30/22 0745   nicotine (NICODERM CQ - dosed in mg/24 hr) patch 7 mg  7 mg Transdermal Daily Leevy-Johnson, Brooke A, NP       nicotine polacrilex (NICORETTE) gum 2 mg  2 mg Oral PRN Leevy-Johnson, Brooke A, NP       QUEtiapine (SEROQUEL XR) 24 hr tablet 100 mg  100 mg Oral QHS Massengill, Nathan, MD       QUEtiapine (SEROQUEL) tablet 50 mg  50 mg Oral QHS PRN Massengill, Nathan, MD       traZODone (DESYREL) tablet 50 mg  50 mg Oral QHS PRN Motley-Mangrum, Jadeka A, PMHNP   50 mg at 12/29/22 2115    Lab Results:  Results for orders placed or performed during the hospital encounter of 12/28/22 (from the past 48 hour(s))  TSH     Status: None   Collection Time: 12/29/22  6:10 PM  Result Value Ref Range   TSH 0.928 0.350 - 4.500 uIU/mL    Comment: Performed by a 3rd Generation assay with a functional sensitivity of <=0.01 uIU/mL. Performed at [facility], [place] [address], [place], [place]  [address]     Blood Alcohol level:  Lab Results  Component Value Date   ETH 263 (H) 12/28/2022   ETH <10 0000000    Metabolic Disorder Labs: No results found for: "HGBA1C", "MPG" No results found for: "PROLACTIN" No results found for: "CHOL", "TRIG", "HDL", "CHOLHDL", "VLDL", "LDLCALC"  Physical Findings: AIMS:  , ,  ,  ,    CIWA:  CIWA-Ar Total: 8 COWS:     Musculoskeletal: Strength & Muscle Tone: within normal limits Gait & Station: normal Patient leans: N/A  Psychiatric Specialty Exam:  Presentation  General Appearance:  Appropriate for Environment; Fairly Groomed  Eye Contact: Good  Speech: Clear and Coherent  Speech Volume: Normal  Handedness: Right   Mood and Affect  Mood: Depressed  Affect: Congruent   Thought Process  Thought Processes: Coherent  Descriptions of Associations:Intact  Orientation:Full (Time, Place and Person)  Thought Content:Logical  History of Schizophrenia/Schizoaffective disorder:No  Duration of Psychotic Symptoms:No data recorded Hallucinations:Hallucinations: None  Ideas of Reference:None  Suicidal Thoughts:Suicidal Thoughts: No  Homicidal Thoughts:Homicidal Thoughts: No   Sensorium  Memory: Immediate Good  Judgment: Fair  Insight: Fair   Community education officer  Concentration: Fair  Attention Span: Good  Recall: Good  Fund of Knowledge: Good  Language: Good   Psychomotor Activity  Psychomotor Activity: Psychomotor Activity: Normal   Assets  Assets: Communication Skills   Sleep  Sleep: Sleep: Fair    Physical Exam: Physical Exam ROS Blood pressure (!) 120/92, pulse (!) 105, temperature 98.3 F (36.8 C), temperature source Oral, resp. rate 16, height '6\' 2"'$  (1.88 m), weight 75.3 kg, SpO2 100 %. Body mass index is 21.31 kg/m.  Treatment Plan Summary: Daily contact with patient to assess and evaluate symptoms and progress in treatment and Medication  management  Observation Level/Precautions:  15 minute checks  Laboratory:  Labs reviewed   Psychotherapy:  Unit Group sessions  Medications:  See [facility]  Consultations:  To be determined  Discharge Concerns:  Safety, medication compliance, mood stability  Estimated LOS: 5-7 days  Other:  N/A    PLAN Safety and Monitoring: Voluntary admission to inpatient psychiatric unit for safety, stabilization and treatment Daily contact with patient to assess and evaluate symptoms and progress in treatment Patient's case to be discussed in multi-disciplinary team meeting Observation Level : q15 minute checks Vital signs: q12 hours Precautions: Safety  Long Term Goal(s): Improvement in symptoms so as ready for discharge  Short Term Goals: Ability to identify changes in lifestyle to reduce recurrence of condition will improve, Ability to verbalize feelings will improve, Ability to disclose and discuss suicidal ideas, Ability to identify and develop effective coping behaviors will improve, Ability to maintain clinical measurements within normal limits will improve, Compliance with prescribed medications will improve, and Ability to identify triggers associated with substance abuse/mental health issues will improve  Diagnoses  Principal Problem:   MDD (major depressive disorder), recurrent severe, without psychosis ([place]) Active Problems:   GAD (generalized anxiety disorder)   Alcohol use disorder, severe, dependence (HCC)   Cocaine use disorder (HCC)  Medications: -Increase Seroquel XR to 100 mg nightly -Start Seroquel IR 50 mg as needed nightly for sleep -Continue Prozac 20 mg daily for MDD and GAD -Continue gabapentin 100 mg twice daily for anxiety -Discontinue PRN Ativan scale from hospital -Start Ativan 1 mg PRN for CIWA scores >10  Other PRNS -Continue Tylenol 650 mg every 6 hours PRN for mild pain -Continue Maalox 30 mg every 4 hrs PRN for indigestion -Continue Milk of Magnesia as  needed every 6 hrs for constipation  Discharge Planning: Social work and case management to assist with discharge planning and identification of hospital follow-up needs prior to discharge Estimated LOS: 5-7 days Discharge Concerns: Need to establish a safety plan; Medication compliance and effectiveness Discharge Goals: Return home with outpatient referrals for mental health follow-up including medication management/psychotherapy  I certify that inpatient services furnished can reasonably be expected to improve the patient's condition.    Nicholes Rough, NP 2/26/20243:13 PM

## 2022-12-30 NOTE — Progress Notes (Signed)
   12/30/22 0919  Psych Admission Type (Psych Patients Only)  Admission Status Voluntary  Psychosocial Assessment  Patient Complaints Substance abuse;Irritability  Eye Contact Fair  Facial Expression Flat  Affect Irritable  Speech Logical/coherent  Interaction Assertive  Motor Activity Other (Comment) ([facility])  Appearance/Hygiene Unremarkable  Behavior Characteristics Cooperative  Mood Depressed  Thought Process  Coherency WDL  Content WDL  Delusions None reported or observed  Perception WDL  Hallucination None reported or observed  Judgment Impaired  Confusion None  Danger to Self  Current suicidal ideation? Denies  Agreement Not to Harm Self Yes  Description of Agreement verbal  Danger to Others  Danger to Others None reported or observed

## 2022-12-30 NOTE — Progress Notes (Signed)
Patient ID: Chad Sanchez, male   DOB: [birth year], 52 y.o.   MRN: [id number]   Pt is increasingly anxious on the unit and irritable. Pt's CIWA is 7. Medication was offered. Per NP, Ativan 1 mg was given.

## 2022-12-31 NOTE — Group Note (Signed)
LCSW Group Therapy Note  Group Date: 12/31/2022 Start Time: 1100 End Time: 1200   Type of Therapy and Topic:  Group Therapy - Healthy vs Unhealthy Coping Skills  Participation Level:  Did Not Attend   Description of Group The focus of this group was to determine what unhealthy coping techniques typically are used by group members and what healthy coping techniques would be helpful in coping with various problems. Patients were guided in becoming aware of the differences between healthy and unhealthy coping techniques. Patients were asked to identify 2-3 healthy coping skills they would like to learn to use more effectively.  Therapeutic Goals Patients learned that coping is what human beings do all day long to deal with various situations in their lives Patients defined and discussed healthy vs unhealthy coping techniques Patients identified their preferred coping techniques and identified whether these were healthy or unhealthy Patients determined 2-3 healthy coping skills they would like to become more familiar with and use more often. Patients provided support and ideas to each other    Therapeutic Modalities Cognitive Behavioral Therapy Motivational Interviewing  Windle Guard, LCSW 12/31/2022  1:15 PM

## 2022-12-31 NOTE — Progress Notes (Signed)
   12/31/22 1145  Psych Admission Type (Psych Patients Only)  Admission Status Voluntary  Psychosocial Assessment  Patient Complaints None  Eye Contact Fair  Facial Expression Flat  Affect Flat  Speech Logical/coherent  Interaction Minimal  Motor Activity Other (Comment) (WD:)  Appearance/Hygiene In scrubs  Behavior Characteristics Appropriate to situation  Mood Anxious  Thought Process  Coherency WDL  Content WDL  Delusions None reported or observed  Perception WDL  Hallucination None reported or observed  Judgment Poor  Confusion None  Danger to Self  Current suicidal ideation? Denies  Danger to Others  Danger to Others None reported or observed

## 2022-12-31 NOTE — Progress Notes (Signed)
Psychoeducational Group Note  Date:  12/31/2022 Time:  2045  Group Topic/Focus:  Wrap-Up Group:   The focus of this group is to help patients review their daily goal of treatment and discuss progress on daily workbooks.  Participation Level: Did Not Attend  Participation Quality:  Not Applicable  Affect:  Not Applicable  Cognitive:  Not Applicable  Insight:  Not Applicable  Engagement in Group: Not Applicable  Additional Comments:  The patient did not attend group this evening.   Archie Balboa S 12/31/2022, 8:45 PM

## 2022-12-31 NOTE — Progress Notes (Signed)
   12/31/22 0630  15 Minute Checks  Location Dayroom  Visual Appearance Calm  Behavior Composed  Sleep (Behavioral Health Patients Only)  Calculate sleep? (Click Yes once per 24 hr at 0600 safety check) Yes  Documented sleep last 24 hours 7.25

## 2022-12-31 NOTE — Progress Notes (Signed)
Adult Psychoeducational Group Note  Date:  12/31/2022 Time:  9:57 AM  Group Topic/Focus:  Goals Group:   The focus of this group is to help patients establish daily goals to achieve during treatment and discuss how the patient can incorporate goal setting into their daily lives to aide in recovery. Orientation:   The focus of this group is to educate the patient on the purpose and policies of crisis stabilization and provide a format to answer questions about their admission.  The group details unit policies and expectations of patients while admitted.  Participation Level:  Did not attend  Participation Quality:    Affect:    Cognitive:     Insight:   Engagement in Group:    Modes of Intervention:    Additional Comments:  Pt did not attend goals group.  Beryle Beams 12/31/2022, 9:57 AM

## 2022-12-31 NOTE — Progress Notes (Signed)
[facility] MD Progress Note  12/31/2022 3:34 PM Chad Sanchez  MRN:  [id number] Principal Problem: MDD (major depressive disorder), recurrent severe, without psychosis ([place]) Diagnosis: Principal Problem:   MDD (major depressive disorder), recurrent severe, without psychosis ([place]) Active Problems:   GAD (generalized anxiety disorder)   Alcohol use disorder, severe, dependence ([place])   Cocaine use disorder ([place])  Reason for admission: Patient is a 52 year old male with a reported psychiatric history of bipolar disorder and anxiety disorder, who was admitted to the [facility] 12/28/22 after initially presenting to [place] 12/28/22 0500 voluntarily via law enforcement for passive suicidal ideation after being discharged from [facility] 12/27/2022 for worsening depression, suicidal thoughts, and alcohol detox. He was assessed and determined not to meet admission criteria then discharged with substance abuse resources. He later presented [facility] with similar complaints where he was assessed and cleared for psychiatric admissions.    24 hr chart review: Vital signs for the past 24 hours within normal limits, with the exception of heart rate which was slightly elevated at 102 earlier today morning.  Patient remains compliant with scheduled medications.  He required hydroxyzine 25 mg today last night and today afternoon for anxiety, and trazodone 50 mg last night for sleep.  Patient has attended some unit group sessions in the past 24 hours.  No behavioral episodes noted in the past 24 hours.  Patient assessment note 12/31/2022: Mood today is euthymic, affect is congruent, attention to personal hygiene and grooming is fair, eye contact is good, speech is clear & coherent. Thought contents are organized and logical, and pt currently denies SI/HI/AVH or paranoia. There is no evidence of delusional thoughts.    Patient reports that his sleep quality last night was fair, he reports a good appetite, he denies medication related  side effects, rates anxiety as a 0, 10 being worst, rates depression as 0, 10 being worst, reports that he had trouble falling asleep last night but once asleep he stays asleep.  He states that he does not want anything added to current medication regimen, denies current concerns, and verbalizes readiness for discharge.  We will continue medications as listed below, and plan is to discharge patient tomorrow.  Total Time spent with patient: 45 minutes  Past Psychiatric History: As below  Past Medical History:  Past Medical History:  Diagnosis Date   Alcohol abuse    Anxiety    Depression    Hypertension    Sickle cell anemia (HCC)     Past Surgical History:  Procedure Laterality Date   TONSILLECTOMY     Family History:  Family History  Problem Relation Age of Onset   Cirrhosis Mother    Family Psychiatric  History: See H & P Social History:  Social History   Substance and Sexual Activity  Alcohol Use Yes   Alcohol/week: 12.0 standard drinks of alcohol   Types: 12 Cans of beer per week   Comment: a case a day; pt told this Probation officer 12 pk daily or a fifth of vodka     Social History   Substance and Sexual Activity  Drug Use Yes   Types: "Crack" cocaine, Cocaine   Comment: last used 2 weeks ago    Social History   Socioeconomic History   Marital status: Single    Spouse name: Not on file   Number of children: Not on file   Years of education: Not on file   Highest education level: Not on file  Occupational History  Not on file  Tobacco Use   Smoking status: Every Day    Packs/day: 0.50    Types: Cigarettes   Smokeless tobacco: Never  Vaping Use   Vaping Use: Never used  Substance and Sexual Activity   Alcohol use: Yes    Alcohol/week: 12.0 standard drinks of alcohol    Types: 12 Cans of beer per week    Comment: a case a day; pt told this writer 12 pk daily or a fifth of vodka   Drug use: Yes    Types: "Crack" cocaine, Cocaine    Comment: last used 2 weeks  ago   Sexual activity: Not Currently  Other Topics Concern   Not on file  Social History Narrative   Not on file   Social Determinants of Health   Financial Resource Strain: Not on file  Food Insecurity: Food Insecurity Present (12/29/2022)   Hunger Vital Sign    Worried About Running Out of Food in the Last Year: Sometimes true    Ran Out of Food in the Last Year: Sometimes true  Transportation Needs: No Transportation Needs (12/29/2022)   PRAPARE - Hydrologist (Medical): No    Lack of Transportation (Non-Medical): No  Physical Activity: Not on file  Stress: Not on file  Social Connections: Not on file   Sleep: Poor  Appetite:  Good  Current Medications: Current Facility-Administered Medications  Medication Dose Route Frequency Provider Last Rate Last Admin   acetaminophen (TYLENOL) tablet 650 mg  650 mg Oral Q6H PRN Motley-Mangrum, Jadeka A, PMHNP       alum & mag hydroxide-simeth (MAALOX/MYLANTA) 200-200-20 MG/5ML suspension 30 mL  30 mL Oral Q4H PRN Motley-Mangrum, Jadeka A, PMHNP       haloperidol (HALDOL) tablet 5 mg  5 mg Oral TID PRN Massengill, Ovid Curd, MD       And   diphenhydrAMINE (BENADRYL) capsule 50 mg  50 mg Oral TID PRN Massengill, Ovid Curd, MD       haloperidol lactate (HALDOL) injection 5 mg  5 mg Intramuscular TID PRN Massengill, Ovid Curd, MD       And   diphenhydrAMINE (BENADRYL) injection 50 mg  50 mg Intramuscular TID PRN Massengill, Ovid Curd, MD       FLUoxetine (PROZAC) capsule 20 mg  20 mg Oral Daily Motley-Mangrum, Jadeka A, PMHNP   20 mg at 12/31/22 0840   gabapentin (NEURONTIN) capsule 100 mg  100 mg Oral BID Motley-Mangrum, Jadeka A, PMHNP   100 mg at 12/31/22 0840   hydrOXYzine (ATARAX) tablet 25 mg  25 mg Oral TID PRN Motley-Mangrum, Jadeka A, PMHNP   25 mg at 12/31/22 1151   LORazepam (ATIVAN) tablet 1 mg  1 mg Oral Q6H PRN Nicholes Rough, NP   1 mg at 12/30/22 1502   magnesium hydroxide (MILK OF MAGNESIA) suspension 30 mL   30 mL Oral Daily PRN Motley-Mangrum, Jadeka A, PMHNP       multivitamin with minerals tablet 1 tablet  1 tablet Oral Daily Motley-Mangrum, Jadeka A, PMHNP   1 tablet at 12/31/22 0840   nicotine (NICODERM CQ - dosed in mg/24 hr) patch 7 mg  7 mg Transdermal Daily Leevy-Johnson, Brooke A, NP       nicotine polacrilex (NICORETTE) gum 2 mg  2 mg Oral PRN Leevy-Johnson, Brooke A, NP       QUEtiapine (SEROQUEL XR) 24 hr tablet 100 mg  100 mg Oral QHS Massengill, Ovid Curd, MD   100 mg at  12/30/22 2142   QUEtiapine (SEROQUEL) tablet 50 mg  50 mg Oral QHS PRN Massengill, Ovid Curd, MD       traZODone (DESYREL) tablet 50 mg  50 mg Oral QHS PRN Motley-Mangrum, Jadeka A, PMHNP   50 mg at 12/30/22 2143    Lab Results:  Results for orders placed or performed during the hospital encounter of 12/28/22 (from the past 48 hour(s))  TSH     Status: None   Collection Time: 12/29/22  6:10 PM  Result Value Ref Range   TSH 0.928 0.350 - 4.500 uIU/mL    Comment: Performed by a 3rd Generation assay with a functional sensitivity of <=0.01 uIU/mL. Performed at [facility], [place] [address] [place], [place] [address]   Hemoglobin A1c     Status: None   Collection Time: 12/29/22  6:10 PM  Result Value Ref Range   Hgb A1c MFr Bld 4.9 4.8 - 5.6 %    Comment: (NOTE)         Prediabetes: 5.7 - 6.4         Diabetes: >6.4         Glycemic control for adults with diabetes: <7.0    Mean Plasma Glucose 94 mg/dL    Comment: (NOTE) Performed At: [facility] [place], [place] [id number] Rush Farmer MD [id number]     Blood Alcohol level:  Lab Results  Component Value Date   ETH 263 (H) 12/28/2022   ETH <10 0000000    Metabolic Disorder Labs: Lab Results  Component Value Date   HGBA1C 4.9 12/29/2022   MPG 94 12/29/2022   No results found for: "PROLACTIN" No results found for: "CHOL", "TRIG", "HDL", "CHOLHDL", "VLDL", "LDLCALC"  Physical Findings: AIMS:  , ,  ,  ,     CIWA:  CIWA-Ar Total: 0 COWS:     Musculoskeletal: Strength & Muscle Tone: within normal limits Gait & Station: normal Patient leans: N/A  Psychiatric Specialty Exam:  Presentation  General Appearance:  Appropriate for Environment  Eye Contact: Good  Speech: Clear and Coherent  Speech Volume: Normal  Handedness: Right   Mood and Affect  Mood: Euthymic  Affect: Congruent   Thought Process  Thought Processes: Coherent  Descriptions of Associations:Intact  Orientation:Full (Time, Place and Person)  Thought Content:Logical  History of Schizophrenia/Schizoaffective disorder:No  Duration of Psychotic Symptoms:No data recorded Hallucinations:Hallucinations: None  Ideas of Reference:None  Suicidal Thoughts:Suicidal Thoughts: No  Homicidal Thoughts:Homicidal Thoughts: No   Sensorium  Memory: Immediate Good  Judgment: Good  Insight: Good   Executive Functions  Concentration: Good  Attention Span: Good  Recall: Good  Fund of Knowledge: Good  Language: Good  Psychomotor Activity  Psychomotor Activity: Psychomotor Activity: Normal   Assets  Assets: Communication Skills  Sleep  Sleep: Sleep: Fair  Physical Exam: Physical Exam Constitutional:      Appearance: Normal appearance.  HENT:     Head: Normocephalic.     Nose: Nose normal.  Eyes:     Pupils: Pupils are equal, round, and reactive to light.  Musculoskeletal:        General: Normal range of motion.  Neurological:     Mental Status: He is alert and oriented to person, place, and time.  Psychiatric:        Mood and Affect: Mood normal.    Review of Systems  Constitutional:  Negative for fever.  HENT: Negative.  Negative for hearing loss.   Eyes: Negative.  Negative for blurred  vision.  Respiratory: Negative.    Cardiovascular: Negative.  Negative for chest pain.  Gastrointestinal: Negative.   Genitourinary: Negative.   Musculoskeletal: Negative.    Skin: Negative.   Neurological: Negative.   Psychiatric/Behavioral:  Positive for depression (resolving) and substance abuse. Negative for hallucinations, memory loss and suicidal ideas. The patient is nervous/anxious (resolving) and has insomnia (resolving).    Blood pressure 97/75, pulse (!) 102, temperature 98.4 F (36.9 C), temperature source Oral, resp. rate 18, height '6\' 2"'$  (1.88 m), weight 75.3 kg, SpO2 96 %. Body mass index is 21.31 kg/m.  Treatment Plan Summary: Daily contact with patient to assess and evaluate symptoms and progress in treatment and Medication management  Observation Level/Precautions:  15 minute checks  Laboratory:  Labs reviewed   Psychotherapy:  Unit Group sessions  Medications:  See [facility]  Consultations:  To be determined   Discharge Concerns:  Safety, medication compliance, mood stability  Estimated LOS: 5-7 days  Other:  N/A    PLAN Safety and Monitoring: Voluntary admission to inpatient psychiatric unit for safety, stabilization and treatment Daily contact with patient to assess and evaluate symptoms and progress in treatment Patient's case to be discussed in multi-disciplinary team meeting Observation Level : q15 minute checks Vital signs: q12 hours Precautions: Safety  Long Term Goal(s): Improvement in symptoms so as ready for discharge  Short Term Goals: Ability to identify changes in lifestyle to reduce recurrence of condition will improve, Ability to verbalize feelings will improve, Ability to disclose and discuss suicidal ideas, Ability to identify and develop effective coping behaviors will improve, Ability to maintain clinical measurements within normal limits will improve, Compliance with prescribed medications will improve, and Ability to identify triggers associated with substance abuse/mental health issues will improve  Diagnoses  Principal Problem:   MDD (major depressive disorder), recurrent severe, without psychosis ([place]) Active  Problems:   GAD (generalized anxiety disorder)   Alcohol use disorder, severe, dependence (HCC)   Cocaine use disorder (HCC)  Medications: -Continue Seroquel XR to 100 mg nightly -Continue Seroquel IR 50 mg as needed nightly for sleep -Continue Prozac 20 mg daily for MDD and GAD -Continue gabapentin 100 mg twice daily for anxiety -Discontinued PRN Ativan scale from hospital  Other PRNS -Continue Tylenol 650 mg every 6 hours PRN for mild pain -Continue Maalox 30 mg every 4 hrs PRN for indigestion -Continue Milk of Magnesia as needed every 6 hrs for constipation  Discharge Planning: Social work and case management to assist with discharge planning and identification of hospital follow-up needs prior to discharge Estimated LOS: 5-7 days Discharge Concerns: Need to establish a safety plan; Medication compliance and effectiveness Discharge Goals: Return home with outpatient referrals for mental health follow-up including medication management/psychotherapy  I certify that inpatient services furnished can reasonably be expected to improve the patient's condition.    Nicholes Rough, [place] 2/27/20243:34 PM   Patient ID: Chad Sanchez, male   DOB: 12-11-1970, 52 y.o.   MRN: [id number]

## 2022-12-31 NOTE — Group Note (Unsigned)
LCSW Group Therapy Note  Group Date: 12/31/2022 Start Time: 1100 End Time: 1300   Type of Therapy and Topic:  Group Therapy - Healthy vs Unhealthy Coping Skills  Participation Level:  {BHH PARTICIPATION LEVEL:22264}   Description of Group The focus of this group was to determine what unhealthy coping techniques typically are used by group members and what healthy coping techniques would be helpful in coping with various problems. Patients were guided in becoming aware of the differences between healthy and unhealthy coping techniques. Patients were asked to identify 2-3 healthy coping skills they would like to learn to use more effectively.  Therapeutic Goals 1. Patients learned that coping is what human beings do all day long to deal with various situations in their lives 2. Patients defined and discussed healthy vs unhealthy coping techniques 3. Patients identified their preferred coping techniques and identified whether these were healthy or unhealthy 4. Patients determined 2-3 healthy coping skills they would like to become more familiar with and use more often. 5. Patients provided support and ideas to each other   Summary of Patient Progress:  During group, *** expressed ***. Patient proved open to input from peers and feedback from [name]. Patient demonstrated *** insight into the subject matter, was respectful of peers, and participated throughout the entire session.   Therapeutic Modalities Cognitive Behavioral Therapy Motivational Interviewing  Brice Kossman S Rayyan Orsborn, LCSWA 12/31/2022  1:10 PM   

## 2022-12-31 NOTE — Plan of Care (Signed)
  Problem: Education: Goal: Knowledge of [facility] Education information/materials will improve Outcome: Progressing Goal: Emotional status will improve Outcome: Progressing Goal: Mental status will improve Outcome: Progressing   Problem: Coping: Goal: Ability to verbalize frustrations and anger appropriately will improve Outcome: Progressing   Problem: Safety: Goal: Periods of time without injury will increase Outcome: Progressing   Problem: Coping: Goal: Coping ability will improve Outcome: Progressing

## 2022-12-31 NOTE — Group Note (Unsigned)
Date:  12/31/2022 Time:  1:32 PM  Group Topic/Focus:  Goals Group:   The focus of this group is to help patients establish daily goals to achieve during treatment and discuss how the patient can incorporate goal setting into their daily lives to aide in recovery. Orientation:   The focus of this group is to educate the patient on the purpose and policies of crisis stabilization and provide a format to answer questions about their admission.  The group details unit policies and expectations of patients while admitted.     Participation Level:  {BHH PARTICIPATION LEVEL:22264}  Participation Quality:  {BHH PARTICIPATION QUALITY:22265}  Affect:  {BHH AFFECT:22266}  Cognitive:  {BHH COGNITIVE:22267}  Insight: {BHH Insight2:20797}  Engagement in Group:  {BHH ENGAGEMENT IN GROUP:22268}  Modes of Intervention:  {BHH MODES OF INTERVENTION:22269}  Additional Comments:  ***  Kassius Battiste Lashawn Marimar Suber 12/31/2022, 1:32 PM  

## 2022-12-31 NOTE — Group Note (Signed)
Recreation Therapy Group Note   Group Topic:Animal Assisted Therapy   Group Date: 12/31/2022 Start Time: 1430 End Time: 1500 Facilitators: Devanny Palecek-McCall, LRT,CTRS Location: [address] Dayroom   Animal-Assisted Activity (AAA) Program Checklist/Progress Notes Patient Eligibility Criteria Checklist & Daily Group note for Rec Tx Intervention  AAA/T Program Assumption of Risk Form signed by Patient/ or Parent Legal Guardian Yes  Patient understands his/her participation is voluntary Yes   Affect/Mood: N/A   Participation Level: Did not attend    Clinical Observations/Individualized Feedback:    Plan: Continue to engage patient in RT group sessions 2-3x/week.   Ebone Alcivar-McCall, LRT,CTRS 12/31/2022 3:25 PM

## 2022-12-31 NOTE — Progress Notes (Signed)
   12/30/22 2140  Psych Admission Type (Psych Patients Only)  Admission Status Voluntary  Psychosocial Assessment  Patient Complaints Anxiety  Eye Contact Fair  Facial Expression Flat  Affect Anxious;Flat  Speech Logical/coherent  Interaction Minimal;Isolative  Motor Activity Other (Comment)  Appearance/Hygiene In scrubs  Behavior Characteristics Appropriate to situation  Mood Anxious  Thought Process  Coherency WDL  Content WDL  Delusions None reported or observed  Perception WDL  Hallucination None reported or observed  Judgment Impaired  Confusion None  Danger to Self  Current suicidal ideation? Denies  Agreement Not to Harm Self Yes  Description of Agreement Verbal  Danger to Others  Danger to Others None reported or observed

## 2023-01-01 DIAGNOSIS — F332 Major depressive disorder, recurrent severe without psychotic features: Principal | ICD-10-CM

## 2023-01-01 NOTE — BHH Suicide Risk Assessment (Addendum)
Suicide Risk Assessment  Discharge Assessment    [facility] Discharge Suicide Risk Assessment   Principal Problem: MDD (major depressive disorder), recurrent severe, without psychosis ([place]) Discharge Diagnoses: Principal Problem:   MDD (major depressive disorder), recurrent severe, without psychosis ([place]) Active Problems:   GAD (generalized anxiety disorder)   Alcohol use disorder, severe, dependence ([place] )   Cocaine use disorder ([place])  Reason for admission: Patient is a 52 year old male with a reported psychiatric history of bipolar disorder and anxiety disorder, who [facility] after initially presenting to [place] 12/28/22 0500 voluntarily via law enforcement for passive suicidal ideation after being discharged from [facility] 12/27/2022 for worsening depression, suicidal thoughts, and alcohol detox. He was assessed and determined not to meet admission criteria then discharged with substance abuse resources. He later presented [facility] with similar complaints where he was assessed and cleared for psychiatric admissions.                         HOSPITAL COURSE During the patient's hospitalization, patient had extensive initial psychiatric evaluation, and follow-up psychiatric evaluations every day.  Psychiatric diagnoses provided upon initial assessment are as listed above. Patient's psychiatric medications were adjusted on admission as follows: - Seroquel XR 50 mg nightly.   - Seroquel IR 50 mg nightly as needed-for insomnia and treatment of mood disorder.  Patient reports Seroquel worked well in the past for mood disorder and insomnia. - fluoxetine 20 mg once daily for MDD and GAD - gabapentin 100 mg 3 times daily -alcohol use disorder and anxiety in context of substance use disorder. -CIWA protocol with scheduled Ativan taper for alcohol use disorder and alcohol withdrawal    During the hospitalization, other adjustments were made to the patient's psychiatric medication regimen.  Medications at discharge are as follows: -Continue Seroquel XR to 100 mg nightly -Continue Prozac 20 mg daily for MDD and GAD -Continue gabapentin 100 mg twice daily for anxiety -Continue Nicotine patch for Nicotine dependence -Continue Vitamin B1 for nutritional supplementation  Patient's care was discussed during the interdisciplinary team meeting every day during the hospitalization. The patient denies having side effects to prescribed psychiatric medication. Gradually, patient started adjusting to milieu. The patient was evaluated each day by a clinical provider to ascertain response to treatment. Improvement was noted by the patient's report of decreasing symptoms, improved sleep and appetite, affect, medication tolerance, behavior, and participation in unit programming.  Patient was asked each day to complete a self inventory noting mood, mental status, pain, new symptoms, anxiety and concerns.    Symptoms were reported as significantly decreased or resolved completely by discharge. On day of discharge, the patient reports that their mood is stable. The patient denied having suicidal thoughts for more than 48 hours prior to discharge.  Patient denies having homicidal thoughts.  Patient denies having auditory hallucinations.  Patient denies any visual hallucinations or other symptoms of psychosis. The patient was motivated to continue taking medication with a goal of continued improvement in mental health.   The patient reports their target psychiatric symptoms of depression, anxiety and insomnia responded well to the psychiatric medications, and the patient reports overall benefit from this psychiatric hospitalization. Supportive psychotherapy was provided to the patient. The patient also participated in regular group therapy while hospitalized. Coping skills, problem solving as well as relaxation therapies were also part of the unit programming.  Labs were reviewed with the patient, and abnormal  results were discussed with the patient.  The patient is able to verbalize their individual safety plan to this provider.  # It is recommended to the patient to continue psychiatric medications as prescribed, after discharge from the hospital.    # It is recommended to the patient to follow up with your outpatient psychiatric provider and PCP.  # It was discussed with the patient, the impact of alcohol, drugs, tobacco have been there overall psychiatric and medical wellbeing, and total abstinence from substance use was recommended the patient.ed.  # Prescriptions provided or sent directly to preferred pharmacy at discharge. Patient agreeable to plan. Given opportunity to ask questions. Appears to feel comfortable with discharge.    # In the event of worsening symptoms, the patient is instructed to call the crisis hotline, 911 and or go to the nearest ED for appropriate evaluation and treatment of symptoms. To follow-up with primary care provider for other medical issues, concerns and or health care needs  # Patient was discharged  with a plan to follow up as noted below.  Total Time spent with patient: 30 minutes  Musculoskeletal: Strength & Muscle Tone: within normal limits Gait & Station: normal Patient leans: N/A  Psychiatric Specialty Exam  Presentation  General Appearance:  Appropriate for Environment; Fairly Groomed  Eye Contact: Good  Speech: Clear and Coherent  Speech Volume: Normal  Handedness: Right   Mood and Affect  Mood: Euthymic  Duration of Depression Symptoms: Greater than two weeks  Affect: Appropriate; Congruent   Thought Process  Thought Processes: Coherent  Descriptions of Associations:Intact  Orientation:Full (Time, Place and Person)  Thought Content:Logical  History of Schizophrenia/Schizoaffective disorder:No  Duration of Psychotic Symptoms:No data recorded Hallucinations:Hallucinations: None  Ideas of Reference:None  Suicidal  Thoughts:Suicidal Thoughts: No  Homicidal Thoughts:Homicidal Thoughts: No   Sensorium  Memory: Immediate Good  Judgment: Good  Insight: Good   Executive Functions  Concentration: Good  Attention Span: Good  Recall: Good  Fund of Knowledge: Good  Language: Good   Psychomotor Activity  Psychomotor Activity: Psychomotor Activity: Normal   Assets  Assets: Communication Skills   Sleep  Sleep: Sleep: Good   Physical Exam: Physical Exam Constitutional:      Appearance: Normal appearance.  HENT:     Head: Normocephalic.     Nose: Nose normal.  Eyes:     Pupils: Pupils are equal, round, and reactive to light.  Cardiovascular:     Rate and Rhythm: Normal rate.  Pulmonary:     Breath sounds: Normal breath sounds.  Musculoskeletal:     Cervical back: Normal range of motion.  Neurological:     Mental Status: He is alert and oriented to person, place, and time.    Review of Systems  Constitutional:  Negative for fever.  HENT: Negative.    Eyes:  Negative for blurred vision.  Respiratory: Negative.    Cardiovascular:  Negative for chest pain.  Gastrointestinal: Negative.   Genitourinary: Negative.   Musculoskeletal: Negative.   Skin: Negative.   Neurological:  Negative for dizziness.  Psychiatric/Behavioral:  Positive for depression (Denies SI.denies HI, denies AVH, verbally contracts for safety outside of [facility]) and substance abuse (Educated on substance abuse cessation and on the negative impact on his mental health. Verablizes understanding). Negative for hallucinations, memory loss and suicidal ideas. The patient is nervous/anxious (resoloving) and has insomnia (resolving).    Blood pressure 114/81, pulse 97, temperature 97.7 F (36.5 C), temperature source Oral, resp. rate 18, height '6\' 2"'$  (1.88 m), weight 75.3 kg, SpO2 100 %.  Body mass index is 21.31 kg/m.  Mental Status Per Nursing Assessment::   On Admission:  Suicidal ideation indicated by  others  Demographic Factors:  Male and Low socioeconomic status  Loss Factors: Financial problems/change in socioeconomic status  Historical Factors: NA  Risk Reduction Factors:   Positive coping skills or problem solving skills  Continued Clinical Symptoms:  Alcohol/Substance Abuse/Dependencies  Cognitive Features That Contribute To Risk:  None    Suicide Risk:  Mild:  There are no identifiable suicide plans, no associated intent, mild dysphoria and related symptoms, good self-control (both objective and subjective assessment), few other risk factors, and identifiable protective factors, including available and accessible social support.   Patient offered the option for a mental health f/u appointment to be made by [name] and rejected offer. He has been educated that in the event that he changes his mind, he should make an appointment via the following:  Please Call [facility] at [phone] to make an appointment for mental health services Address is [place] [address]   Temari Schooler, NP 01/01/2023, 10:11 AM

## 2023-01-01 NOTE — Plan of Care (Signed)
  Problem: Education: Goal: Knowledge of [facility] Education information/materials will improve Outcome: Progressing Goal: Emotional status will improve Outcome: Progressing Goal: Mental status will improve Outcome: Progressing   Problem: Activity: Goal: Interest or engagement in activities will improve Outcome: Progressing   Problem: Health Behavior/Discharge Planning: Goal: Identification of resources available to assist in meeting health care needs will improve Outcome: Progressing   Problem: Safety: Goal: Periods of time without injury will increase Outcome: Progressing

## 2023-01-01 NOTE — Progress Notes (Signed)
Discharged with all belongings, AVS, transition record and SRA. Pt reports he will not be taking medication when he leaves.

## 2023-01-01 NOTE — [facility]HH Suicide Risk Assessment (Signed)
[place] INPATIENT:  Family/Significant Other Suicide Prevention Education  Suicide Prevention Education:  Patient Refusal for Family/Significant Other Suicide Prevention Education: The patient Chad Sanchez has refused to provide written consent for family/significant other to be provided Family/Significant Other Suicide Prevention Education during admission and/or prior to discharge.  Physician notified.  Gola Bribiesca S Cristin Szatkowski 01/01/2023, 9:38 AM

## 2023-01-01 NOTE — Discharge Summary (Signed)
Physician Discharge Summary Note  Patient:  Chad Sanchez is an 52 y.o., male MRN:  [id number] DOB:  Nov 30, 1970 Patient phone:  [phone] (home)  Patient address:   [place] [address],  Total Time spent with patient: 30 minutes  Date of Admission:  12/28/2022 Date of Discharge: 12/12/2022  Reason for Admission:  Reason for admission: Patient is a 52 year old male with a reported psychiatric history of bipolar disorder and anxiety disorder, who was admitted to the [facility] 12/28/22 after initially presenting to [place] 12/28/22 0500 voluntarily via law enforcement for passive suicidal ideation after being discharged from [facility] 12/27/2022 for worsening depression, suicidal thoughts, and alcohol detox. He was assessed and determined not to meet admission criteria then discharged with substance abuse resources. He later presented [facility] with similar complaints where he was assessed and cleared for psychiatric admissions.      Principal Problem: MDD (major depressive disorder), recurrent severe, without psychosis ([place]) Discharge Diagnoses: Principal Problem:   MDD (major depressive disorder), recurrent severe, without psychosis ([place]) Active Problems:   GAD (generalized anxiety disorder)   Alcohol use disorder, severe, dependence ([place])   Cocaine use disorder ([place])  Past Psychiatric History: See H & P  Past Medical History:  Past Medical History:  Diagnosis Date   Alcohol abuse    Anxiety    Depression    Hypertension    Sickle cell anemia (HCC)     Past Surgical History:  Procedure Laterality Date   TONSILLECTOMY     Family History:  Family History  Problem Relation Age of Onset   Cirrhosis Mother    Family Psychiatric  History: See H & P Social History:  Social History   Substance and Sexual Activity  Alcohol Use Yes   Alcohol/week: 12.0 standard drinks of alcohol   Types: 12 Cans of beer per week   Comment: a case a day; pt told this Probation officer 12 pk daily  or a fifth of vodka     Social History   Substance and Sexual Activity  Drug Use Yes   Types: "Crack" cocaine, Cocaine   Comment: last used 2 weeks ago    Social History   Socioeconomic History   Marital status: Single    Spouse name: Not on file   Number of children: Not on file   Years of education: Not on file   Highest education level: Not on file  Occupational History   Not on file  Tobacco Use   Smoking status: Every Day    Packs/day: 0.50    Types: Cigarettes   Smokeless tobacco: Never  Vaping Use   Vaping Use: Never used  Substance and Sexual Activity   Alcohol use: Yes    Alcohol/week: 12.0 standard drinks of alcohol    Types: 12 Cans of beer per week    Comment: a case a day; pt told this writer 12 pk daily or a fifth of vodka   Drug use: Yes    Types: "Crack" cocaine, Cocaine    Comment: last used 2 weeks ago   Sexual activity: Not Currently  Other Topics Concern   Not on file  Social History Narrative   Not on file   Social Determinants of Health   Financial Resource Strain: Not on file  Food Insecurity: Food Insecurity Present (12/29/2022)   Hunger Vital Sign    Worried About Running Out of Food in the Last Year: Sometimes true    Ran Out of Food in the  Last Year: Sometimes true  Transportation Needs: No Transportation Needs (12/29/2022)   PRAPARE - Hydrologist (Medical): No    Lack of Transportation (Non-Medical): No  Physical Activity: Not on file  Stress: Not on file  Social Connections: Not on file                              Hospital Course:  During the patient's hospitalization, patient had extensive initial psychiatric evaluation, and follow-up psychiatric evaluations every day.  Psychiatric diagnoses provided upon initial assessment are as listed above. Patient's psychiatric medications were adjusted on admission as follows: - Seroquel XR 50 mg nightly.   - Seroquel IR 50 mg nightly as needed-for insomnia and  treatment of mood disorder.  Patient reports Seroquel worked well in the past for mood disorder and insomnia. - fluoxetine 20 mg once daily for MDD and GAD - gabapentin 100 mg 3 times daily -alcohol use disorder and anxiety in context of substance use disorder. -CIWA protocol with scheduled Ativan taper for alcohol use disorder and alcohol withdrawal     During the hospitalization, other adjustments were made to the patient's psychiatric medication regimen. Medications at discharge are as follows: -Continue Seroquel XR to 100 mg nightly -Continue Prozac 20 mg daily for MDD and GAD -Continue gabapentin 100 mg twice daily for anxiety -Continue Nicotine patch for Nicotine dependence -Continue Vitamin B1 for nutritional supplementation   Patient's care was discussed during the interdisciplinary team meeting every day during the hospitalization. The patient denies having side effects to prescribed psychiatric medication. Gradually, patient started adjusting to milieu. The patient was evaluated each day by a clinical provider to ascertain response to treatment. Improvement was noted by the patient's report of decreasing symptoms, improved sleep and appetite, affect, medication tolerance, behavior, and participation in unit programming.  Patient was asked each day to complete a self inventory noting mood, mental status, pain, new symptoms, anxiety and concerns.     Symptoms were reported as significantly decreased or resolved completely by discharge. On day of discharge, the patient reports that their mood is stable. The patient denied having suicidal thoughts for more than 48 hours prior to discharge.  Patient denies having homicidal thoughts.  Patient denies having auditory hallucinations.  Patient denies any visual hallucinations or other symptoms of psychosis. The patient was motivated to continue taking medication with a goal of continued improvement in mental health.    The patient reports their  target psychiatric symptoms of depression, anxiety and insomnia responded well to the psychiatric medications, and the patient reports overall benefit from this psychiatric hospitalization. Supportive psychotherapy was provided to the patient. The patient also participated in regular group therapy while hospitalized. Coping skills, problem solving as well as relaxation therapies were also part of the unit programming.   Labs were reviewed with the patient, and abnormal results were discussed with the patient.   The patient is able to verbalize their individual safety plan to this provider.   # It is recommended to the patient to continue psychiatric medications as prescribed, after discharge from the hospital.     # It is recommended to the patient to follow up with your outpatient psychiatric provider and PCP.   # It was discussed with the patient, the impact of alcohol, drugs, tobacco have been there overall psychiatric and medical wellbeing, and total abstinence from substance use was recommended the patient.ed.   # Prescriptions  provided or sent directly to preferred pharmacy at discharge. Patient agreeable to plan. Given opportunity to ask questions. Appears to feel comfortable with discharge.    # In the event of worsening symptoms, the patient is instructed to call the crisis hotline, 911 and or go to the nearest ED for appropriate evaluation and treatment of symptoms. To follow-up with primary care provider for other medical issues, concerns and or health care needs   # Patient was discharged  with a plan to follow up as noted below.    Physical Findings: AIMS: 0 CIWA:  CIWA-Ar Total: 0 COWS: n/a  Musculoskeletal: Strength & Muscle Tone: within normal limits Gait & Station: normal Patient leans: N/A  Psychiatric Specialty Exam:  Presentation  General Appearance:  Appropriate for Environment; Fairly Groomed  Eye Contact: Good  Speech: Clear and Coherent  Speech  Volume: Normal  Handedness: Right  Mood and Affect  Mood: Euthymic  Affect: Appropriate; Congruent  Thought Process  Thought Processes: Coherent  Descriptions of Associations:Intact  Orientation:Full (Time, Place and Person)  Thought Content:Logical  History of Schizophrenia/Schizoaffective disorder:No  Duration of Psychotic Symptoms:No data recorded Hallucinations:Hallucinations: None  Ideas of Reference:None  Suicidal Thoughts:Suicidal Thoughts: No  Homicidal Thoughts:Homicidal Thoughts: No  Sensorium  Memory: Immediate Good  Judgment: Good  Insight: Good  Executive Functions  Concentration: Good  Attention Span: Good  Recall: Good  Fund of Knowledge: Good  Language: Good  Psychomotor Activity  Psychomotor Activity: Psychomotor Activity: Normal  Assets  Assets: Communication Skills  Sleep  Sleep: Sleep: Good  Physical Exam: Physical Exam Constitutional:      Appearance: Normal appearance.  HENT:     Nose: Nose normal.  Eyes:     Pupils: Pupils are equal, round, and reactive to light.  Musculoskeletal:        General: Normal range of motion.     Cervical back: Normal range of motion.  Neurological:     Mental Status: He is alert and oriented to person, place, and time.  Psychiatric:        Thought Content: Thought content normal.    Review of Systems  Constitutional: Negative.   HENT: Negative.  Negative for hearing loss.   Eyes: Negative.  Negative for blurred vision.  Respiratory: Negative.    Cardiovascular: Negative.   Gastrointestinal: Negative.   Genitourinary:  Negative for dysuria.  Musculoskeletal: Negative.   Skin: Negative.   Neurological: Negative.  Negative for dizziness.  Psychiatric/Behavioral:  Positive for depression (Denies SI, denies HI, denies AVH, verbally contracts for safety outside of [facility]) and substance abuse (Educated on substance use cessation and verbalizes understanding). Negative for  hallucinations, memory loss and suicidal ideas. The patient is nervous/anxious (resolving) and has insomnia (resolving).    Blood pressure 114/81, pulse 97, temperature 97.7 F (36.5 C), temperature source Oral, resp. rate 18, height '6\' 2"'$  (1.88 m), weight 75.3 kg, SpO2 100 %. Body mass index is 21.31 kg/m.  Social History   Tobacco Use  Smoking Status Every Day   Packs/day: 0.50   Types: Cigarettes  Smokeless Tobacco Never   Tobacco Cessation:  A prescription for an FDA-approved tobacco cessation medication provided at discharge  Blood Alcohol level:  Lab Results  Component Value Date   ETH 263 (H) 12/28/2022   ETH <10 0000000   Metabolic Disorder Labs:  Lab Results  Component Value Date   HGBA1C 4.9 12/29/2022   MPG 94 12/29/2022   No results found for: "PROLACTIN" No results found for: "  CHOL", "TRIG", "HDL", "CHOLHDL", "VLDL", "LDLCALC"  See Psychiatric Specialty Exam and Suicide Risk Assessment completed by Attending Physician prior to discharge.  Discharge destination:  Home  Is patient on multiple antipsychotic therapies at discharge:  No   Has Patient had three or more failed trials of antipsychotic monotherapy by history:  No  Recommended Plan for Multiple Antipsychotic Therapies: NA  Discharge Instructions     Diet - low sodium heart healthy   Complete by: As directed    Increase activity slowly   Complete by: As directed       Allergies as of 01/01/2023       Reactions   Fish Allergy Anaphylaxis   Shellfish Allergy Anaphylaxis        Medication List     TAKE these medications      Indication  FLUoxetine 20 MG capsule Commonly known as: PROZAC Take 1 capsule (20 mg total) by mouth daily.  Indication: Excessive Use of Alcohol, Depression   gabapentin 100 MG capsule Commonly known as: NEURONTIN Take 1 capsule (100 mg total) by mouth 2 (two) times daily for 14 days.  Indication: Agitation, Alcohol Withdrawal Syndrome   multivitamin  with minerals Tabs tablet Take 1 tablet by mouth daily.  Indication: Vitamin Supplementation   nicotine 14 mg/24hr patch Commonly known as: NICODERM CQ - dosed in mg/24 hours Place 1 patch (14 mg total) onto the skin daily.  Indication: Nicotine Addiction   QUEtiapine 50 MG Tb24 24 hr tablet Commonly known as: SEROQUEL XR Take 1 tablet (50 mg total) by mouth at bedtime for 14 days.  Indication: Major Depressive Disorder   thiamine 100 MG tablet Commonly known as: Vitamin B-1 Take 1 tablet (100 mg total) by mouth daily.  Indication: alcohol use        Follow-up Information     [facility]. Go in 1 day(s).   Specialty: Behavioral Health Contact information: [address] [place] [phone]               Signed: Nicholes Rough, NP 01/01/2023, 1:54 PM

## 2023-01-01 NOTE — Progress Notes (Signed)
   01/01/23 0600  15 Minute Checks  Location Bedroom  Visual Appearance Calm  Behavior Sleeping  Sleep (Behavioral Health Patients Only)  Calculate sleep? (Click Yes once per 24 hr at 0600 safety check) Yes  Documented sleep last 24 hours 7.75

## 2023-01-01 NOTE — Plan of Care (Signed)
  Problem: Education: Goal: Knowledge of [facility] Education information/materials will improve Outcome: Completed/Met Goal: Emotional status will improve Outcome: Completed/Met Goal: Mental status will improve Outcome: Completed/Met Goal: Verbalization of understanding the information provided will improve Outcome: Completed/Met   Problem: Activity: Goal: Interest or engagement in activities will improve Outcome: Completed/Met Goal: Sleeping patterns will improve Outcome: Completed/Met   Problem: Coping: Goal: Ability to verbalize frustrations and anger appropriately will improve Outcome: Completed/Met Goal: Ability to demonstrate self-control will improve Outcome: Completed/Met   Problem: Physical Regulation: Goal: Ability to maintain clinical measurements within normal limits will improve Outcome: Completed/Met   Problem: Safety: Goal: Periods of time without injury will increase Outcome: Completed/Met   Problem: Education: Goal: Utilization of techniques to improve thought processes will improve Outcome: Completed/Met Goal: Knowledge of the prescribed therapeutic regimen will improve Outcome: Completed/Met   Problem: Coping: Goal: Coping ability will improve Outcome: Completed/Met Goal: Will verbalize feelings Outcome: Completed/Met   Problem: Health Behavior/Discharge Planning: Goal: Ability to make decisions will improve Outcome: Completed/Met Goal: Compliance with therapeutic regimen will improve Outcome: Completed/Met   Problem: Role Relationship: Goal: Will demonstrate positive changes in social behaviors and relationships Outcome: Completed/Met   Problem: Self-Concept: Goal: Will verbalize positive feelings about self Outcome: Completed/Met Goal: Level of anxiety will decrease Outcome: Completed/Met   Problem: Education: Goal: Ability to make informed decisions regarding treatment will improve Outcome: Completed/Met   Problem:  Coping: Goal: Coping ability will improve Outcome: Completed/Met   Problem: Medication: Goal: Compliance with prescribed medication regimen will improve Outcome: Completed/Met   Problem: Health Behavior/Discharge Planning: Goal: Identification of resources available to assist in meeting health care needs will improve Outcome: Completed/Met   Problem: Self-Concept: Goal: Ability to disclose and discuss suicidal ideas will improve Outcome: Completed/Met Goal: Will verbalize positive feelings about self Outcome: Completed/Met   Problem: Education: Goal: Knowledge of disease or condition will improve Outcome: Completed/Met Goal: Understanding of discharge needs will improve Outcome: Completed/Met

## 2023-01-01 NOTE — Discharge Instructions (Signed)
-  Follow-up with your outpatient psychiatric provider -instructions on appointment date, time, and address (location) are provided to you in discharge paperwork.  -Take your psychiatric medications as prescribed at discharge - instructions are provided to you in the discharge paperwork You stated that you still had printed prescriptions in your possession that you could get filled after dc from this hospital.  -Follow-up with outpatient primary care doctor and other specialists -for management of preventative medicine and any chronic medical disease.  -Recommend abstinence from alcohol, tobacco, and other illicit drug use at discharge.   -If your psychiatric symptoms recur, worsen, or if you have side effects to your psychiatric medications, call your outpatient psychiatric provider, 911, [phone] or go to the nearest emergency department.  -If suicidal thoughts occur, call your outpatient psychiatric provider, 911, [phone] or go to the nearest emergency department.  Naloxone (Narcan) can help reverse an overdose when given to the victim quickly.  [facility] offers free naloxone kits and instructions/training on its use.  Add naloxone to your first aid kit and you can help save a life.   Pick up your free kit at the following locations:   [place]:  [address] [place] [address] [phone]) [facility] [place] [id number] [phone]) [facility] [place]  [place]: [employer] [place] [id number] [place] [id number] [phone]) [facility] [place] [id number] [phone])

## 2023-01-01 NOTE — Progress Notes (Signed)
   12/31/22 2050  Psychosocial Assessment  Patient Complaints None  Eye Contact Fair  Facial Expression Flat  Affect Flat  Speech Logical/coherent  Interaction Guarded;Minimal  Motor Activity Other (Comment)  Appearance/Hygiene In scrubs  Behavior Characteristics Appropriate to situation  Mood Anxious  Thought Process  Coherency WDL  Content WDL  Delusions None reported or observed  Perception WDL  Hallucination None reported or observed  Judgment Poor  Confusion None  Danger to Self  Current suicidal ideation? Denies  Agreement Not to Harm Self Yes  Description of Agreement Verbal  Danger to Others  Danger to Others None reported or observed

## 2023-01-01 NOTE — Progress Notes (Signed)
  [facility] Adult Case Management Discharge Plan :  Will you be returning to the same living situation after discharge:  Yes,  Pt is homeless and did not consent to release any information pertaining to suicide Prevention Education At discharge, do you have transportation home?: Yes,  Patient was provided a bus pass Do you have the ability to pay for your medications: Yes,  Insured  Release of information consent forms completed and in the chart;  Patient's signature needed at discharge.  Patient to Follow up at:   Next level of care provider has access to [place] and Suicide Prevention discussed: No. Pt declined consent     Has patient been referred to the Quitline?: Patient refused referral  Patient has been referred for addiction treatment: Pt. refused referral Patient to continue working towards treatment goals after discharge. Patient no longer meets criteria for inpatient criteria per attending physician. Continue taking medications as prescribed, nursing to provide instructions at discharge. Follow up with all scheduled appointments.   Park Beck S Cydne Grahn, LCSW 01/01/2023, 9:34 AM
